# Patient Record
Sex: Female | Born: 1945
Health system: Southern US, Community
[De-identification: ages and names within clinical notes are randomized; demographics above are authoritative.]

## PROBLEM LIST (undated history)

## (undated) DIAGNOSIS — M1990 Unspecified osteoarthritis, unspecified site: Secondary | ICD-10-CM

## (undated) DIAGNOSIS — M8580 Other specified disorders of bone density and structure, unspecified site: Secondary | ICD-10-CM

## (undated) DIAGNOSIS — J449 Chronic obstructive pulmonary disease, unspecified: Secondary | ICD-10-CM

## (undated) DIAGNOSIS — F419 Anxiety disorder, unspecified: Secondary | ICD-10-CM

## (undated) DIAGNOSIS — I10 Essential (primary) hypertension: Secondary | ICD-10-CM

## (undated) DIAGNOSIS — K219 Gastro-esophageal reflux disease without esophagitis: Secondary | ICD-10-CM

## (undated) DIAGNOSIS — H409 Unspecified glaucoma: Secondary | ICD-10-CM

## (undated) DIAGNOSIS — E785 Hyperlipidemia, unspecified: Secondary | ICD-10-CM

## (undated) HISTORY — DX: Other specified disorders of bone density and structure, unspecified site: M85.80

## (undated) HISTORY — PX: CARPAL TUNNEL RELEASE: SHX101

## (undated) HISTORY — DX: Unspecified glaucoma: H40.9

## (undated) HISTORY — PX: SHOULDER SURGERY: SHX246

## (undated) HISTORY — PX: ABDOMINAL HYSTERECTOMY: SHX81

## (undated) HISTORY — DX: Hyperlipidemia, unspecified: E78.5

## (undated) HISTORY — DX: Anxiety disorder, unspecified: F41.9

## (undated) HISTORY — PX: ELBOW SURGERY: SHX618

## (undated) HISTORY — DX: Essential (primary) hypertension: I10

## (undated) HISTORY — DX: Gastro-esophageal reflux disease without esophagitis: K21.9

---

## 1996-03-11 DIAGNOSIS — H409 Unspecified glaucoma: Secondary | ICD-10-CM

## 1996-03-11 HISTORY — DX: Unspecified glaucoma: H40.9

## 2003-01-07 ENCOUNTER — Other Ambulatory Visit: Admission: RE | Admit: 2003-01-07 | Discharge: 2003-01-07 | Payer: Self-pay | Admitting: Family Medicine

## 2004-08-10 ENCOUNTER — Ambulatory Visit (HOSPITAL_COMMUNITY): Admission: RE | Admit: 2004-08-10 | Discharge: 2004-08-10 | Payer: Self-pay | Admitting: Gastroenterology

## 2005-09-24 ENCOUNTER — Other Ambulatory Visit: Admission: RE | Admit: 2005-09-24 | Discharge: 2005-09-24 | Payer: Self-pay | Admitting: Family Medicine

## 2010-08-18 ENCOUNTER — Inpatient Hospital Stay (INDEPENDENT_AMBULATORY_CARE_PROVIDER_SITE_OTHER)
Admission: RE | Admit: 2010-08-18 | Discharge: 2010-08-18 | Disposition: A | Discharge status: Home / Self Care | Payer: Medicare Other | Source: Ambulatory Visit | Attending: Emergency Medicine | Admitting: Emergency Medicine

## 2010-08-18 DIAGNOSIS — S058X9A Other injuries of unspecified eye and orbit, initial encounter: Secondary | ICD-10-CM

## 2012-06-11 ENCOUNTER — Other Ambulatory Visit: Payer: Self-pay | Admitting: Nurse Practitioner

## 2012-06-19 ENCOUNTER — Other Ambulatory Visit: Payer: Self-pay | Admitting: Nurse Practitioner

## 2012-06-19 NOTE — Telephone Encounter (Signed)
Patient last seen in office on 11-20-11 for cpe. Please advise. Thank you

## 2012-07-27 ENCOUNTER — Encounter: Payer: Self-pay | Admitting: Nurse Practitioner

## 2012-07-27 ENCOUNTER — Ambulatory Visit (INDEPENDENT_AMBULATORY_CARE_PROVIDER_SITE_OTHER): Payer: Medicare Other | Admitting: Nurse Practitioner

## 2012-07-27 VITALS — BP 131/85 | HR 84 | Temp 99.1°F | Ht 66.0 in | Wt 144.0 lb

## 2012-07-27 DIAGNOSIS — E785 Hyperlipidemia, unspecified: Secondary | ICD-10-CM | POA: Insufficient documentation

## 2012-07-27 DIAGNOSIS — H409 Unspecified glaucoma: Secondary | ICD-10-CM

## 2012-07-27 DIAGNOSIS — K219 Gastro-esophageal reflux disease without esophagitis: Secondary | ICD-10-CM | POA: Insufficient documentation

## 2012-07-27 DIAGNOSIS — J449 Chronic obstructive pulmonary disease, unspecified: Secondary | ICD-10-CM | POA: Insufficient documentation

## 2012-07-27 DIAGNOSIS — M79609 Pain in unspecified limb: Secondary | ICD-10-CM

## 2012-07-27 DIAGNOSIS — I10 Essential (primary) hypertension: Secondary | ICD-10-CM

## 2012-07-27 DIAGNOSIS — J45909 Unspecified asthma, uncomplicated: Secondary | ICD-10-CM

## 2012-07-27 LAB — COMPLETE METABOLIC PANEL WITH GFR
Albumin: 4.7 g/dL (ref 3.5–5.2)
Alkaline Phosphatase: 45 U/L (ref 39–117)
BUN: 17 mg/dL (ref 6–23)
GFR, Est African American: 57 mL/min — ABNORMAL LOW
GFR, Est Non African American: 50 mL/min — ABNORMAL LOW
Glucose, Bld: 94 mg/dL (ref 70–99)
Potassium: 3.9 mEq/L (ref 3.5–5.3)

## 2012-07-27 MED ORDER — HYDROCODONE-ACETAMINOPHEN 5-325 MG PO TABS: 1.0000 | ORAL_TABLET | Freq: Four times a day (QID) | ORAL | Status: DC | PRN

## 2012-07-27 MED ORDER — FLUTICASONE-SALMETEROL 250-50 MCG/DOSE IN AEPB: 1.0000 | INHALATION_SPRAY | Freq: Two times a day (BID) | RESPIRATORY_TRACT | Status: DC

## 2012-07-27 MED ORDER — FENOFIBRATE MICRONIZED 134 MG PO CAPS: 134.0000 mg | ORAL_CAPSULE | Freq: Every day | ORAL | Status: DC

## 2012-07-27 MED ORDER — LISINOPRIL-HYDROCHLOROTHIAZIDE 20-25 MG PO TABS: 1.0000 | ORAL_TABLET | Freq: Every day | ORAL | Status: DC

## 2012-07-27 MED ORDER — PRAVASTATIN SODIUM 40 MG PO TABS: 40.0000 mg | ORAL_TABLET | Freq: Every day | ORAL | Status: DC

## 2012-07-27 MED ORDER — OMEPRAZOLE 20 MG PO CPDR: 20.0000 mg | DELAYED_RELEASE_CAPSULE | Freq: Two times a day (BID) | ORAL | Status: DC

## 2012-07-27 NOTE — Progress Notes (Signed)
Subjective:    Patient ID: Samantha Huerta, female    DOB: Nov 10, 1945, 67 y.o.   MRN: [id number]  Hypertension This is a chronic problem. The current episode started more than 1 year ago. The problem is unchanged. The problem is controlled. Pertinent negatives include no blurred vision, chest pain, headaches, peripheral edema, shortness of breath or sweats. Risk factors for coronary artery disease include dyslipidemia and post-menopausal state. Past treatments include ACE inhibitors and diuretics. The current treatment provides significant improvement. Compliance problems include diet and exercise.   Hyperlipidemia This is a chronic problem. The current episode started more than 1 year ago. The problem is controlled. Recent lipid tests were reviewed and are normal. Pertinent negatives include no chest pain or shortness of breath. Current antihyperlipidemic treatment includes fibric acid derivatives and statins. The current treatment provides moderate improvement of lipids. Compliance problems include adherence to diet and adherence to exercise.  Risk factors for coronary artery disease include hypertension and post-menopausal.  Gerd Omeprazole OTC- Worknig well no symptoms when she takes COPD Uses inhalers dialy- under control- Patient still smoking  BIL Carpal Tunnel Syrgery Needs refill on lortab that she takes PRN for pain bil hands   Review of Systems  Eyes: Negative for blurred vision.  Respiratory: Negative for shortness of breath.   Cardiovascular: Negative for chest pain.  Neurological: Negative for headaches.  All other systems reviewed and are negative.       Objective:   Physical Exam  Constitutional: She is oriented to person, place, and time. She appears well-developed and well-nourished.  HENT:  Nose: Nose normal.  Mouth/Throat: Oropharynx is clear and moist.  Eyes: EOM are normal.  Neck: Trachea normal, normal range of motion and full passive range of motion without  pain. Neck supple. No JVD present. Carotid bruit is not present. No thyromegaly present.  Cardiovascular: Normal rate, regular rhythm, normal heart sounds and intact distal pulses.  Exam reveals no gallop and no friction rub.   No murmur heard. Pulmonary/Chest: Effort normal and breath sounds normal.  Abdominal: Soft. Bowel sounds are normal. She exhibits no distension and no mass. There is no tenderness.  Musculoskeletal: Normal range of motion.  Lymphadenopathy:    She has no cervical adenopathy.  Neurological: She is alert and oriented to person, place, and time. She has normal reflexes.  Skin: Skin is warm and dry.  Psychiatric: She has a normal mood and affect. Her behavior is normal. Judgment and thought content normal.   BP 131/85  Pulse 84  Temp(Src) 99.1 F (37.3 C) (Oral)  Ht 5\' 6"  (1.676 m)  Wt 144 lb (65.318 kg)  BMI 23.25 kg/m2        Assessment & Plan:  1. Hypertension Low NA+ diet - COMPLETE METABOLIC PANEL WITH GFR - lisinopril-hydrochlorothiazide (PRINZIDE,ZESTORETIC) 20-25 MG per tablet; Take 1 tablet by mouth daily.  Dispense: 90 tablet; Refill: 1  2. Hyperlipidemia Low fat diet and exercise - NMR Lipoprofile with Lipids - fenofibrate micronized (LOFIBRA) 134 MG capsule; Take 1 capsule (134 mg total) by mouth daily before breakfast.  Dispense: 30 capsule; Refill: 5 - pravastatin (PRAVACHOL) 40 MG tablet; Take 1 tablet (40 mg total) by mouth daily.  Dispense: 30 tablet; Refill: 5  3. GERD (gastroesophageal reflux disease) Watch spicy na dfatty foods- May aggrevate symptoms - omeprazole (PRILOSEC) 20 MG capsule; Take 1 capsule (20 mg total) by mouth 2 (two) times daily.  Dispense: 30 capsule; Refill: 5  4. Asthma, chronic Smoking cessation -  Fluticasone-Salmeterol (ADVAIR DISKUS) 250-50 MCG/DOSE AEPB; Inhale 1 puff into the lungs 2 (two) times daily.  Dispense: 60 each; Refill: 5  5. Glaucoma Keep follw-up appointment with eye doctor  6. Hand pain,  unspecified laterality Moist heat- soak in warm epsom salt - HYDROcodone-acetaminophen (NORCO/VICODIN) 5-325 MG per tablet; Take 1 tablet by mouth every 6 (six) hours as needed for pain.  Dispense: 30 tablet; Refill: 0   Mary-Margaret Daphine Deutscher, FNP

## 2012-07-27 NOTE — Patient Instructions (Signed)
Smoking Cessation Quitting smoking is important to your health and has many advantages. However, it is not always easy to quit since nicotine is a very addictive drug. Often times, people try 3 times or more before being able to quit. This document explains the best ways for you to prepare to quit smoking. Quitting takes hard work and a lot of effort, but you can do it. ADVANTAGES OF QUITTING SMOKING  You will live longer, feel better, and live better.  Your body will feel the impact of quitting smoking almost immediately.  Within 20 minutes, blood pressure decreases. Your pulse returns to its normal level.  After 8 hours, carbon monoxide levels in the blood return to normal. Your oxygen level increases.  After 24 hours, the chance of having a heart attack starts to decrease. Your breath, hair, and body stop smelling like smoke.  After 48 hours, damaged nerve endings begin to recover. Your sense of taste and smell improve.  After 72 hours, the body is virtually free of nicotine. Your bronchial tubes relax and breathing becomes easier.  After 2 to 12 weeks, lungs can hold more air. Exercise becomes easier and circulation improves.  The risk of having a heart attack, stroke, cancer, or lung disease is greatly reduced.  After 1 year, the risk of coronary heart disease is cut in half.  After 5 years, the risk of stroke falls to the same as a nonsmoker.  After 10 years, the risk of lung cancer is cut in half and the risk of other cancers decreases significantly.  After 15 years, the risk of coronary heart disease drops, usually to the level of a nonsmoker.  If you are pregnant, quitting smoking will improve your chances of having a healthy baby.  The people you live with, especially any children, will be healthier.  You will have extra money to spend on things other than cigarettes. QUESTIONS TO THINK ABOUT BEFORE ATTEMPTING TO QUIT You may want to talk about your answers with your  caregiver.  Why do you want to quit?  If you tried to quit in the past, what helped and what did not?  What will be the most difficult situations for you after you quit? How will you plan to handle them?  Who can help you through the tough times? Your family? Friends? A caregiver?  What pleasures do you get from smoking? What ways can you still get pleasure if you quit? Here are some questions to ask your caregiver:  How can you help me to be successful at quitting?  What medicine do you think would be best for me and how should I take it?  What should I do if I need more help?  What is smoking withdrawal like? How can I get information on withdrawal? GET READY  Set a quit date.  Change your environment by getting rid of all cigarettes, ashtrays, matches, and lighters in your home, car, or work. Do not let people smoke in your home.  Review your past attempts to quit. Think about what worked and what did not. GET SUPPORT AND ENCOURAGEMENT You have a better chance of being successful if you have help. You can get support in many ways.  Tell your family, friends, and co-workers that you are going to quit and need their support. Ask them not to smoke around you.  Get individual, group, or telephone counseling and support. Programs are available at local hospitals and health centers. Call your local health department for   information about programs in your area.  Spiritual beliefs and practices may help some smokers quit.  Download a "quit meter" on your computer to keep track of quit statistics, such as how long you have gone without smoking, cigarettes not smoked, and money saved.  Get a self-help book about quitting smoking and staying off of tobacco. LEARN NEW SKILLS AND BEHAVIORS  Distract yourself from urges to smoke. Talk to someone, go for a walk, or occupy your time with a task.  Change your normal routine. Take a different route to work. Drink tea instead of coffee.  Eat breakfast in a different place.  Reduce your stress. Take a hot bath, exercise, or read a book.  Plan something enjoyable to do every day. Reward yourself for not smoking.  Explore interactive web-based programs that specialize in helping you quit. GET MEDICINE AND USE IT CORRECTLY Medicines can help you stop smoking and decrease the urge to smoke. Combining medicine with the above behavioral methods and support can greatly increase your chances of successfully quitting smoking.  Nicotine replacement therapy helps deliver nicotine to your body without the negative effects and risks of smoking. Nicotine replacement therapy includes nicotine gum, lozenges, inhalers, nasal sprays, and skin patches. Some may be available over-the-counter and others require a prescription.  Antidepressant medicine helps people abstain from smoking, but how this works is unknown. This medicine is available by prescription.  Nicotinic receptor partial agonist medicine simulates the effect of nicotine in your brain. This medicine is available by prescription. Ask your caregiver for advice about which medicines to use and how to use them based on your health history. Your caregiver will tell you what side effects to look out for if you choose to be on a medicine or therapy. Carefully read the information on the package. Do not use any other product containing nicotine while using a nicotine replacement product.  RELAPSE OR DIFFICULT SITUATIONS Most relapses occur within the first 3 months after quitting. Do not be discouraged if you start smoking again. Remember, most people try several times before finally quitting. You may have symptoms of withdrawal because your body is used to nicotine. You may crave cigarettes, be irritable, feel very hungry, cough often, get headaches, or have difficulty concentrating. The withdrawal symptoms are only temporary. They are strongest when you first quit, but they will go away within  10 14 days. To reduce the chances of relapse, try to:  Avoid drinking alcohol. Drinking lowers your chances of successfully quitting.  Reduce the amount of caffeine you consume. Once you quit smoking, the amount of caffeine in your body increases and can give you symptoms, such as a rapid heartbeat, sweating, and anxiety.  Avoid smokers because they can make you want to smoke.  Do not let weight gain distract you. Many smokers will gain weight when they quit, usually less than 10 pounds. Eat a healthy diet and stay active. You can always lose the weight gained after you quit.  Find ways to improve your mood other than smoking. FOR MORE INFORMATION  www.smokefree.gov  Document Released: 02/19/2001 Document Revised: 08/27/2011 Document Reviewed: 06/06/2011 [employer] Patient Information 2013 [employer].  

## 2012-07-31 LAB — NMR LIPOPROFILE WITH LIPIDS
HDL Size: 9.7 nm (ref >=9.2)
LDL Particle Number: 751 nmol/L (ref <1000)
LDL Size: 20.5 nm — ABNORMAL LOW (ref >20.5)
Large VLDL-P: 1.9 nmol/L (ref <=2.7)
Small LDL Particle Number: 433 nmol/L (ref <=527)
Triglycerides: 58 mg/dL (ref <150)
VLDL Size: 50 nm — ABNORMAL HIGH (ref <=46.6)

## 2012-08-12 ENCOUNTER — Telehealth: Payer: Self-pay | Admitting: Nurse Practitioner

## 2012-08-12 NOTE — Telephone Encounter (Signed)
Patient aware sent to triage to make an appointment

## 2012-08-12 NOTE — Telephone Encounter (Signed)
NTBS.

## 2012-08-12 NOTE — Telephone Encounter (Signed)
Please make an appointment asap

## 2012-08-12 NOTE — Telephone Encounter (Signed)
Already addressed- NTBS

## 2012-08-13 ENCOUNTER — Ambulatory Visit (INDEPENDENT_AMBULATORY_CARE_PROVIDER_SITE_OTHER): Payer: Medicare Other | Admitting: Nurse Practitioner

## 2012-08-13 ENCOUNTER — Encounter: Payer: Self-pay | Admitting: Nurse Practitioner

## 2012-08-13 VITALS — BP 143/79 | HR 88 | Temp 98.0°F | Ht 65.5 in | Wt 155.0 lb

## 2012-08-13 DIAGNOSIS — R350 Frequency of micturition: Secondary | ICD-10-CM

## 2012-08-13 LAB — POCT UA - MICROSCOPIC ONLY
Casts, Ur, LPF, POC: NEGATIVE
Crystals, Ur, HPF, POC: NEGATIVE

## 2012-08-13 LAB — POCT URINALYSIS DIPSTICK
Bilirubin, UA: NEGATIVE
Ketones, UA: NEGATIVE
Spec Grav, UA: 1.02
pH, UA: 7

## 2012-08-13 MED ORDER — CIPROFLOXACIN HCL 500 MG PO TABS: 500.0000 mg | ORAL_TABLET | Freq: Two times a day (BID) | ORAL | Status: DC

## 2012-08-13 NOTE — Progress Notes (Signed)
  Subjective:    Patient ID: Samantha Huerta, female    DOB: 05-24-1945, 67 y.o.   MRN: [id number]  Urinary Tract Infection  This is a new problem. The current episode started today. The problem occurs every urination. The problem has been gradually worsening. The quality of the pain is described as burning and aching. The pain is at a severity of 10/10. The pain is severe. There has been no fever. The fever has been present for 3 - 4 days. Associated symptoms include frequency and urgency. She has tried NSAIDs for the symptoms.    Pt c/o of burning, itching, and pain with urination.  Pain is 10/10. She states pain is worse with walking. Pt taken motrin 400mg  with no relief and taken Norco 5-325 with relief.     Review of Systems  Genitourinary: Positive for dysuria, urgency, frequency and difficulty urinating.  Musculoskeletal: Positive for gait problem.  All other systems reviewed and are negative.       Objective:   Physical Exam  Constitutional: She appears well-developed and well-nourished.  Neck: Normal range of motion. No thyromegaly present.  Cardiovascular: Normal rate, regular rhythm and normal heart sounds.   Pulmonary/Chest: Effort normal.  Course breath sound bilaterally, with dry productive cough  Abdominal: Soft. Bowel sounds are normal. She exhibits no mass. There is no tenderness. There is no guarding.  Negative for flank pain with palapation  Musculoskeletal: Normal range of motion.  Skin: Skin is warm and dry.  Psychiatric: She has a normal mood and affect. Her behavior is normal. Judgment and thought content normal.   BP 143/79  Pulse 88  Temp(Src) 98 F (36.7 C)  Ht 5' 5.5" (1.664 m)  Wt 155 lb (70.308 kg)  BMI 25.39 kg/m2  Results for orders placed in visit on 08/13/12  POCT URINALYSIS DIPSTICK      Result Value Range   Color, UA yellow     Clarity, UA cloudy     Glucose, UA neg     Bilirubin, UA neg     Ketones, UA neg     Spec Grav, UA 1.020     Blood, UA mod     pH, UA 7.0     Protein, UA mod     Urobilinogen, UA negative     Nitrite, UA neg     Leukocytes, UA moderate (2+)    POCT UA - MICROSCOPIC ONLY      Result Value Range   WBC, Ur, HPF, POC 15-20     RBC, urine, microscopic tnyc     Bacteria, U Microscopic few     Mucus, UA trace     Epithelial cells, urine per micros occ     Crystals, Ur, HPF, POC neg     Casts, Ur, LPF, POC neg     Yeast, UA neg           Assessment & Plan:  1. Frequency of urination AZO over the counter if helps with symptoms - POCT urinalysis dipstick - POCT UA - Microscopic Only - Urine culture - ciprofloxacin (CIPRO) 500 MG tablet; Take 1 tablet (500 mg total) by mouth 2 (two) times daily.  Dispense: 14 tablet; Refill: 0  Mary-Margaret Daphine Deutscher, FNP

## 2012-08-13 NOTE — Patient Instructions (Addendum)
Urinary Tract Infection  Urinary tract infections (UTIs) can develop anywhere along your urinary tract. Your urinary tract is your body's drainage system for removing wastes and extra water. Your urinary tract includes two kidneys, two ureters, a bladder, and a urethra. Your kidneys are a pair of bean-shaped organs. Each kidney is about the size of your fist. They are located below your ribs, one on each side of your spine.  CAUSES  Infections are caused by microbes, which are microscopic organisms, including fungi, viruses, and bacteria. These organisms are so small that they can only be seen through a microscope. Bacteria are the microbes that most commonly cause UTIs.  SYMPTOMS   Symptoms of UTIs may vary by age and gender of the patient and by the location of the infection. Symptoms in young women typically include a frequent and intense urge to urinate and a painful, burning feeling in the bladder or urethra during urination. Older women and men are more likely to be tired, shaky, and weak and have muscle aches and abdominal pain. A fever may mean the infection is in your kidneys. Other symptoms of a kidney infection include pain in your back or sides below the ribs, nausea, and vomiting.  DIAGNOSIS  To diagnose a UTI, your caregiver will ask you about your symptoms. Your caregiver also will ask to provide a urine sample. The urine sample will be tested for bacteria and white blood cells. White blood cells are made by your body to help fight infection.  TREATMENT   Typically, UTIs can be treated with medication. Because most UTIs are caused by a bacterial infection, they usually can be treated with the use of antibiotics. The choice of antibiotic and length of treatment depend on your symptoms and the type of bacteria causing your infection.  HOME CARE INSTRUCTIONS   If you were prescribed antibiotics, take them exactly as your caregiver instructs you. Finish the medication even if you feel better after you  have only taken some of the medication.   Drink enough water and fluids to keep your urine clear or pale yellow.   Avoid caffeine, tea, and carbonated beverages. They tend to irritate your bladder.   Empty your bladder often. Avoid holding urine for long periods of time.   Empty your bladder before and after sexual intercourse.   After a bowel movement, women should cleanse from front to back. Use each tissue only once.  SEEK MEDICAL CARE IF:    You have back pain.   You develop a fever.   Your symptoms do not begin to resolve within 3 days.  SEEK IMMEDIATE MEDICAL CARE IF:    You have severe back pain or lower abdominal pain.   You develop chills.   You have nausea or vomiting.   You have continued burning or discomfort with urination.  MAKE SURE YOU:    Understand these instructions.   Will watch your condition.   Will get help right away if you are not doing well or get worse.  Document Released: 12/05/2004 Document Revised: 08/27/2011 Document Reviewed: 04/05/2011  [employer] Patient Information 2014 [employer].

## 2012-08-15 LAB — URINE CULTURE

## 2012-08-18 NOTE — Progress Notes (Signed)
Pt aware of culture 

## 2012-10-14 ENCOUNTER — Other Ambulatory Visit: Payer: Self-pay | Admitting: Nurse Practitioner

## 2012-10-15 NOTE — Telephone Encounter (Signed)
Patient last seen in office on 08-13-12. Rx last filled on 07-27-12 for #30. Please advise. If approved please print and route to Pool B so the nurse can call pt to pick up

## 2012-10-15 NOTE — Telephone Encounter (Signed)
rx ready for pickup 

## 2012-10-15 NOTE — Telephone Encounter (Signed)
Up front 

## 2012-12-17 ENCOUNTER — Ambulatory Visit: Payer: Medicare Other

## 2013-01-05 ENCOUNTER — Ambulatory Visit (INDEPENDENT_AMBULATORY_CARE_PROVIDER_SITE_OTHER): Payer: Medicare Other

## 2013-01-05 DIAGNOSIS — Z23 Encounter for immunization: Secondary | ICD-10-CM

## 2013-01-07 ENCOUNTER — Telehealth: Payer: Self-pay | Admitting: Nurse Practitioner

## 2013-01-07 NOTE — Telephone Encounter (Signed)
Hasn't had filled since August- what does she need it for.

## 2013-01-07 NOTE — Telephone Encounter (Signed)
Picked up the phone and hung up unable to leave message

## 2013-01-14 NOTE — Telephone Encounter (Signed)
Again what does she need it for- hasn't had since august

## 2013-01-15 NOTE — Telephone Encounter (Signed)
When she had surgery on her wrist and it is bothering her so she just take it when she needs it

## 2013-02-08 ENCOUNTER — Other Ambulatory Visit: Payer: Self-pay

## 2013-02-08 DIAGNOSIS — I10 Essential (primary) hypertension: Secondary | ICD-10-CM

## 2013-02-08 MED ORDER — LISINOPRIL-HYDROCHLOROTHIAZIDE 20-25 MG PO TABS: 1.0000 | ORAL_TABLET | Freq: Every day | ORAL | Status: DC

## 2013-02-08 NOTE — Telephone Encounter (Signed)
Last seen 6/14  MMM  Patient requesting a 90 day supply

## 2013-04-08 ENCOUNTER — Other Ambulatory Visit: Payer: Self-pay | Admitting: Nurse Practitioner

## 2013-04-09 ENCOUNTER — Other Ambulatory Visit: Payer: Self-pay | Admitting: *Deleted

## 2013-04-09 DIAGNOSIS — E785 Hyperlipidemia, unspecified: Secondary | ICD-10-CM

## 2013-04-09 MED ORDER — PRAVASTATIN SODIUM 40 MG PO TABS: 40.0000 mg | ORAL_TABLET | Freq: Every day | ORAL | Status: DC

## 2013-04-09 MED ORDER — FENOFIBRATE MICRONIZED 134 MG PO CAPS: 134.0000 mg | ORAL_CAPSULE | Freq: Every day | ORAL | Status: DC

## 2013-04-20 ENCOUNTER — Telehealth: Payer: Self-pay | Admitting: Nurse Practitioner

## 2013-04-23 NOTE — Telephone Encounter (Signed)
Has had letters that she is over due for her mammo, and in here chart she is not due till 04/01/14 she said this is wrong wants to know what is going on

## 2013-04-23 NOTE — Telephone Encounter (Signed)
Last one in chart says 01/07/11

## 2013-04-25 NOTE — Telephone Encounter (Signed)
Chart health maintenance automatically defaults to every 2 years- but we still recommend yearly mammogram- needs to go ahead a get

## 2013-04-26 NOTE — Telephone Encounter (Signed)
Left detailed message on home voicemail. 

## 2013-05-11 ENCOUNTER — Ambulatory Visit (INDEPENDENT_AMBULATORY_CARE_PROVIDER_SITE_OTHER): Payer: Medicare Other | Admitting: Nurse Practitioner

## 2013-05-11 ENCOUNTER — Encounter: Payer: Self-pay | Admitting: Nurse Practitioner

## 2013-05-11 VITALS — BP 125/69 | HR 106 | Temp 98.1°F | Ht 65.0 in | Wt 135.0 lb

## 2013-05-11 DIAGNOSIS — K219 Gastro-esophageal reflux disease without esophagitis: Secondary | ICD-10-CM

## 2013-05-11 DIAGNOSIS — Z13820 Encounter for screening for osteoporosis: Secondary | ICD-10-CM

## 2013-05-11 DIAGNOSIS — E785 Hyperlipidemia, unspecified: Secondary | ICD-10-CM

## 2013-05-11 DIAGNOSIS — J45909 Unspecified asthma, uncomplicated: Secondary | ICD-10-CM

## 2013-05-11 LAB — POCT UA - MICROSCOPIC ONLY
BACTERIA, U MICROSCOPIC: NEGATIVE
CASTS, UR, LPF, POC: NEGATIVE
Crystals, Ur, HPF, POC: NEGATIVE
MUCUS UA: NEGATIVE
RBC, urine, microscopic: NEGATIVE
Yeast, UA: NEGATIVE

## 2013-05-11 LAB — POCT URINALYSIS DIPSTICK
Bilirubin, UA: NEGATIVE
Blood, UA: NEGATIVE
Glucose, UA: NEGATIVE
KETONES UA: NEGATIVE
Nitrite, UA: NEGATIVE
PH UA: 6
PROTEIN UA: NEGATIVE
SPEC GRAV UA: 1.015
Urobilinogen, UA: NEGATIVE

## 2013-05-11 MED ORDER — FLUTICASONE-SALMETEROL 250-50 MCG/DOSE IN AEPB: 1.0000 | INHALATION_SPRAY | Freq: Two times a day (BID) | RESPIRATORY_TRACT | Status: DC

## 2013-05-11 MED ORDER — FENOFIBRATE MICRONIZED 134 MG PO CAPS: 134.0000 mg | ORAL_CAPSULE | Freq: Every day | ORAL | Status: DC

## 2013-05-11 MED ORDER — OMEPRAZOLE 20 MG PO CPDR: 20.0000 mg | DELAYED_RELEASE_CAPSULE | Freq: Two times a day (BID) | ORAL | Status: DC

## 2013-05-11 MED ORDER — PRAVASTATIN SODIUM 40 MG PO TABS: 40.0000 mg | ORAL_TABLET | Freq: Every day | ORAL | Status: DC

## 2013-05-11 NOTE — Progress Notes (Signed)
Subjective:    Patient ID: Samantha Huerta, female    DOB: 1945/09/05, 68 y.o.   MRN: [id number]  Patient here today for follow up- no complaints or changes since last visit.  Hypertension This is a chronic problem. The current episode started more than 1 year ago. The problem is unchanged. The problem is controlled. Pertinent negatives include no blurred vision, chest pain, headaches, peripheral edema, shortness of breath or sweats. Risk factors for coronary artery disease include dyslipidemia and post-menopausal state. Past treatments include ACE inhibitors and diuretics. The current treatment provides significant improvement. Compliance problems include diet and exercise.   Hyperlipidemia This is a chronic problem. The current episode started more than 1 year ago. The problem is controlled. Recent lipid tests were reviewed and are normal. Pertinent negatives include no chest pain or shortness of breath. Current antihyperlipidemic treatment includes fibric acid derivatives and statins. The current treatment provides moderate improvement of lipids. Compliance problems include adherence to diet and adherence to exercise.  Risk factors for coronary artery disease include hypertension and post-menopausal.  Gerd Omeprazole OTC- Worknig well no symptoms when she takes COPD Uses inhalers dialy- under control- Patient still smoking  Review of Systems  Constitutional: Negative for fever, activity change, appetite change and fatigue.  HENT: Negative for congestion and postnasal drip.   Eyes: Negative for blurred vision.  Respiratory: Negative for shortness of breath.   Cardiovascular: Negative for chest pain.  Gastrointestinal: Negative for diarrhea, constipation and blood in stool.  Genitourinary: Negative for dysuria and frequency.  Skin: Negative for rash.  Neurological: Negative for weakness and headaches.  All other systems reviewed and are negative.       Objective:   Physical Exam   Constitutional: She is oriented to person, place, and time. She appears well-developed and well-nourished.  HENT:  Nose: Nose normal.  Mouth/Throat: Oropharynx is clear and moist.  Eyes: EOM are normal.  Neck: Trachea normal, normal range of motion and full passive range of motion without pain. Neck supple. No JVD present. Carotid bruit is not present. No thyromegaly present.  Cardiovascular: Normal rate, regular rhythm, normal heart sounds and intact distal pulses.  Exam reveals no gallop and no friction rub.   No murmur heard. Pulmonary/Chest: Effort normal and breath sounds normal. No respiratory distress. She has no wheezes. She has no rales.  Abdominal: Soft. Bowel sounds are normal. She exhibits no distension and no mass. There is no tenderness.  Musculoskeletal: Normal range of motion.  Lymphadenopathy:    She has no cervical adenopathy.  Neurological: She is alert and oriented to person, place, and time. She has normal reflexes.  Skin: Skin is warm and dry.  Psychiatric: She has a normal mood and affect. Her behavior is normal. Judgment and thought content normal.   BP 125/69  Pulse 106  Temp(Src) 98.1 F (36.7 C) (Oral)  Ht $R'5\' 5"'WZ$  (1.651 m)  Wt 135 lb (61.236 kg)  BMI 22.47 kg/m2        Assessment & Plan:   1. Hyperlipidemia   2. Asthma, chronic   3. GERD (gastroesophageal reflux disease)    Orders Placed This Encounter  Procedures  . CMP14+EGFR  . NMR, lipoprofile  . POCT urinalysis dipstick  . POCT UA - Microscopic Only   Meds ordered this encounter  Medications  . fenofibrate micronized (LOFIBRA) 134 MG capsule    Sig: Take 1 capsule (134 mg total) by mouth daily before breakfast.    Dispense:  30 capsule  Refill:  0    Needs to be seen before next refill    Order Specific Question:  Supervising Provider    Answer:  Chipper Herb [[id number]]  . Fluticasone-Salmeterol (ADVAIR DISKUS) 250-50 MCG/DOSE AEPB    Sig: Inhale 1 puff into the lungs 2 (two) times  daily.    Dispense:  60 each    Refill:  5    NTBS    Order Specific Question:  Supervising Provider    Answer:  Chipper Herb [[id number]]  . omeprazole (PRILOSEC) 20 MG capsule    Sig: Take 1 capsule (20 mg total) by mouth 2 (two) times daily.    Dispense:  30 capsule    Refill:  5    Order Specific Question:  Supervising Provider    Answer:  Chipper Herb [[id number]]  . pravastatin (PRAVACHOL) 40 MG tablet    Sig: Take 1 tablet (40 mg total) by mouth daily.    Dispense:  30 tablet    Refill:  0    Needs to be seen before next refill    Order Specific Question:  Supervising Provider    Answer:  Chipper Herb [[id number]]  ordered dexa scan  Hemoccult cards given Mammogram scheduled for the end of the month Labs pending Health maintenance reviewed Diet and exercise encouraged Continue all meds Follow up  In 6 months   [place], FNP

## 2013-05-11 NOTE — Patient Instructions (Signed)

## 2013-05-12 ENCOUNTER — Telehealth: Payer: Self-pay | Admitting: Nurse Practitioner

## 2013-05-12 DIAGNOSIS — M549 Dorsalgia, unspecified: Principal | ICD-10-CM

## 2013-05-12 DIAGNOSIS — G8929 Other chronic pain: Secondary | ICD-10-CM | POA: Insufficient documentation

## 2013-05-12 MED ORDER — HYDROCODONE-ACETAMINOPHEN 5-325 MG PO TABS: ORAL_TABLET | ORAL | Status: DC

## 2013-05-12 NOTE — Telephone Encounter (Signed)
rx ready for pickup 

## 2013-05-13 ENCOUNTER — Other Ambulatory Visit: Payer: Self-pay | Admitting: Nurse Practitioner

## 2013-05-13 LAB — CMP14+EGFR
A/G RATIO: 2 (ref 1.1–2.5)
ALK PHOS: 54 IU/L (ref 39–117)
ALT: 20 IU/L (ref 0–32)
AST: 28 IU/L (ref 0–40)
Albumin: 4.6 g/dL (ref 3.6–4.8)
BUN/Creatinine Ratio: 17 (ref 11–26)
BUN: 23 mg/dL (ref 8–27)
CO2: 23 mmol/L (ref 18–29)
Calcium: 10.3 mg/dL (ref 8.7–10.3)
Chloride: 99 mmol/L (ref 97–108)
Creatinine, Ser: 1.36 mg/dL — ABNORMAL HIGH (ref 0.57–1.00)
GFR calc Af Amer: 46 mL/min/{1.73_m2} — ABNORMAL LOW (ref >59)
GFR, EST NON AFRICAN AMERICAN: 40 mL/min/{1.73_m2} — AB (ref >59)
GLOBULIN, TOTAL: 2.3 g/dL (ref 1.5–4.5)
Glucose: 100 mg/dL — ABNORMAL HIGH (ref 65–99)
Potassium: 4.4 mmol/L (ref 3.5–5.2)
SODIUM: 140 mmol/L (ref 134–144)
Total Bilirubin: 0.3 mg/dL (ref 0.0–1.2)
Total Protein: 6.9 g/dL (ref 6.0–8.5)

## 2013-05-13 LAB — NMR, LIPOPROFILE
Cholesterol: 153 mg/dL (ref <200)
HDL CHOLESTEROL BY NMR: 71 mg/dL (ref >=40)
HDL PARTICLE NUMBER: 45.9 umol/L (ref >=30.5)
LDL Particle Number: 876 nmol/L (ref <1000)
LDL SIZE: 19.8 nm — AB (ref >20.5)
LDLC SERPL CALC-MCNC: 58 mg/dL (ref <100)
LP-IR SCORE: 52 — AB (ref <=45)
SMALL LDL PARTICLE NUMBER: 563 nmol/L — AB (ref <=527)
Triglycerides by NMR: 122 mg/dL (ref <150)

## 2013-05-13 MED ORDER — CIPROFLOXACIN HCL 500 MG PO TABS: 500.0000 mg | ORAL_TABLET | Freq: Two times a day (BID) | ORAL | Status: DC

## 2013-05-13 NOTE — Telephone Encounter (Signed)
Patient aware rx ready to be picked up 

## 2013-05-26 ENCOUNTER — Other Ambulatory Visit: Payer: Medicare Other

## 2013-05-26 DIAGNOSIS — Z1212 Encounter for screening for malignant neoplasm of rectum: Secondary | ICD-10-CM

## 2013-05-26 NOTE — Progress Notes (Signed)
Pt mailed in FOBT [facility] called and needed an order

## 2013-05-31 LAB — FECAL OCCULT BLOOD, IMMUNOCHEMICAL: FECAL OCCULT BLD: NEGATIVE

## 2013-06-07 ENCOUNTER — Encounter: Payer: Self-pay | Admitting: *Deleted

## 2013-06-07 NOTE — Progress Notes (Signed)
Quick Note:  Copy of labs sent to patient ______ 

## 2013-06-23 ENCOUNTER — Other Ambulatory Visit: Payer: Medicare Other

## 2013-06-23 ENCOUNTER — Ambulatory Visit: Payer: Self-pay

## 2013-06-24 ENCOUNTER — Encounter: Payer: Self-pay | Admitting: *Deleted

## 2013-07-27 ENCOUNTER — Other Ambulatory Visit: Payer: Self-pay | Admitting: Nurse Practitioner

## 2013-08-25 ENCOUNTER — Ambulatory Visit: Payer: Medicare Other

## 2013-08-25 ENCOUNTER — Other Ambulatory Visit: Payer: Medicare Other

## 2013-09-01 ENCOUNTER — Ambulatory Visit: Payer: Medicare Other

## 2013-09-01 ENCOUNTER — Other Ambulatory Visit: Payer: Medicare Other

## 2013-09-09 ENCOUNTER — Other Ambulatory Visit: Payer: Self-pay | Admitting: Nurse Practitioner

## 2013-11-08 ENCOUNTER — Telehealth: Payer: Self-pay | Admitting: Nurse Practitioner

## 2013-11-08 ENCOUNTER — Other Ambulatory Visit: Payer: Self-pay | Admitting: Nurse Practitioner

## 2013-11-09 NOTE — Telephone Encounter (Signed)
Letter sent with new appointment date/time 

## 2013-11-17 ENCOUNTER — Ambulatory Visit: Payer: Medicare Other

## 2013-11-17 ENCOUNTER — Other Ambulatory Visit: Payer: Medicare Other

## 2013-11-17 ENCOUNTER — Ambulatory Visit: Payer: Self-pay

## 2013-11-18 ENCOUNTER — Telehealth: Payer: Self-pay | Admitting: Family Medicine

## 2013-11-18 MED ORDER — PRAVASTATIN SODIUM 40 MG PO TABS: 40.0000 mg | ORAL_TABLET | Freq: Every day | ORAL | Status: DC

## 2013-11-18 NOTE — Telephone Encounter (Signed)
done

## 2013-12-09 ENCOUNTER — Other Ambulatory Visit: Payer: Self-pay | Admitting: *Deleted

## 2013-12-09 ENCOUNTER — Telehealth: Payer: Self-pay | Admitting: Family Medicine

## 2013-12-09 ENCOUNTER — Ambulatory Visit (INDEPENDENT_AMBULATORY_CARE_PROVIDER_SITE_OTHER): Payer: Medicare Other

## 2013-12-09 DIAGNOSIS — Z23 Encounter for immunization: Secondary | ICD-10-CM

## 2013-12-09 MED ORDER — FENOFIBRATE MICRONIZED 134 MG PO CAPS: ORAL_CAPSULE | ORAL | Status: DC

## 2013-12-09 NOTE — Telephone Encounter (Signed)
What is the provider request?

## 2013-12-09 NOTE — Telephone Encounter (Signed)
done

## 2013-12-09 NOTE — Telephone Encounter (Signed)
Last lipids 3/15. 

## 2014-01-12 ENCOUNTER — Other Ambulatory Visit: Payer: Medicare Other

## 2014-01-12 ENCOUNTER — Ambulatory Visit: Payer: Medicare Other

## 2014-01-19 ENCOUNTER — Other Ambulatory Visit: Payer: Self-pay | Admitting: Nurse Practitioner

## 2014-01-25 ENCOUNTER — Ambulatory Visit (INDEPENDENT_AMBULATORY_CARE_PROVIDER_SITE_OTHER): Payer: Medicare Other

## 2014-01-25 ENCOUNTER — Ambulatory Visit (INDEPENDENT_AMBULATORY_CARE_PROVIDER_SITE_OTHER): Payer: Medicare Other | Admitting: Nurse Practitioner

## 2014-01-25 ENCOUNTER — Encounter: Payer: Self-pay | Admitting: Nurse Practitioner

## 2014-01-25 VITALS — BP 130/74 | HR 90 | Temp 96.6°F | Ht 65.0 in | Wt 140.0 lb

## 2014-01-25 DIAGNOSIS — Z01419 Encounter for gynecological examination (general) (routine) without abnormal findings: Secondary | ICD-10-CM

## 2014-01-25 DIAGNOSIS — I10 Essential (primary) hypertension: Secondary | ICD-10-CM

## 2014-01-25 DIAGNOSIS — Z720 Tobacco use: Secondary | ICD-10-CM

## 2014-01-25 DIAGNOSIS — F17200 Nicotine dependence, unspecified, uncomplicated: Secondary | ICD-10-CM

## 2014-01-25 DIAGNOSIS — Z13820 Encounter for screening for osteoporosis: Secondary | ICD-10-CM

## 2014-01-25 DIAGNOSIS — H409 Unspecified glaucoma: Secondary | ICD-10-CM

## 2014-01-25 DIAGNOSIS — Z0000 Encounter for general adult medical examination without abnormal findings: Secondary | ICD-10-CM

## 2014-01-25 DIAGNOSIS — K219 Gastro-esophageal reflux disease without esophagitis: Secondary | ICD-10-CM

## 2014-01-25 DIAGNOSIS — M549 Dorsalgia, unspecified: Secondary | ICD-10-CM

## 2014-01-25 DIAGNOSIS — G8929 Other chronic pain: Secondary | ICD-10-CM

## 2014-01-25 DIAGNOSIS — E785 Hyperlipidemia, unspecified: Secondary | ICD-10-CM

## 2014-01-25 LAB — POCT URINALYSIS DIPSTICK
BILIRUBIN UA: NEGATIVE
Blood, UA: NEGATIVE
GLUCOSE UA: NEGATIVE
Ketones, UA: NEGATIVE
NITRITE UA: NEGATIVE
Protein, UA: NEGATIVE
Spec Grav, UA: 1.01
Urobilinogen, UA: NEGATIVE
pH, UA: 7.5

## 2014-01-25 LAB — POCT CBC
GRANULOCYTE PERCENT: 63.5 % (ref 37–80)
HCT, POC: 39.7 % (ref 37.7–47.9)
HEMOGLOBIN: 12.8 g/dL (ref 12.2–16.2)
LYMPH, POC: 2.2 (ref 0.6–3.4)
MCH, POC: 29.6 pg (ref 27–31.2)
MCHC: 32.2 g/dL (ref 31.8–35.4)
MCV: 92.1 fL (ref 80–97)
MPV: 7.5 fL (ref 0–99.8)
PLATELET COUNT, POC: 509 10*3/uL — AB (ref 142–424)
POC Granulocyte: 5.3 (ref 2–6.9)
POC LYMPH %: 26.8 % (ref 10–50)
RBC: 4.3 M/uL (ref 4.04–5.48)
RDW, POC: 13.3 %
WBC: 8.3 10*3/uL (ref 4.6–10.2)

## 2014-01-25 LAB — POCT UA - MICROSCOPIC ONLY
Bacteria, U Microscopic: NEGATIVE
CASTS, UR, LPF, POC: NEGATIVE
CRYSTALS, UR, HPF, POC: NEGATIVE
Mucus, UA: NEGATIVE
RBC, URINE, MICROSCOPIC: NEGATIVE
Yeast, UA: NEGATIVE

## 2014-01-25 MED ORDER — HYDROCODONE-ACETAMINOPHEN 5-325 MG PO TABS: ORAL_TABLET | ORAL | Status: DC

## 2014-01-25 MED ORDER — LISINOPRIL-HYDROCHLOROTHIAZIDE 20-25 MG PO TABS: 1.0000 | ORAL_TABLET | Freq: Every day | ORAL | Status: DC

## 2014-01-25 NOTE — Progress Notes (Signed)
Subjective:    Patient ID: Samantha Huerta, female    DOB: February 05, 1946, 68 y.o.   MRN: [id number]  Patient here today for follow up and annual physical exam with PAP- no complaints or changes since last visit.  Hypertension This is a chronic problem. The current episode started more than 1 year ago. The problem is controlled. Pertinent negatives include no chest pain, headaches or shortness of breath. Risk factors for coronary artery disease include dyslipidemia, post-menopausal state and sedentary lifestyle. Past treatments include ACE inhibitors and diuretics. The current treatment provides moderate improvement. Compliance problems include diet and exercise.   Hyperlipidemia This is a chronic problem. The current episode started more than 1 year ago. The problem is uncontrolled. Recent lipid tests were reviewed and are high. She has no history of diabetes, hypothyroidism or obesity. Pertinent negatives include no chest pain or shortness of breath. Current antihyperlipidemic treatment includes statins. The current treatment provides mild improvement of lipids. Compliance problems include adherence to diet and adherence to exercise.  Risk factors for coronary artery disease include dyslipidemia, hypertension and post-menopausal.  Gerd Omeprazole OTC- Worknig well no symptoms when she takes COPD Uses inhalers dialy- under control- Patient still smoking Chronic back pain Takes norco as needed- not a surgery candidate     Review of Systems  Constitutional: Negative for fever, activity change, appetite change and fatigue.  HENT: Negative for congestion and postnasal drip.   Respiratory: Negative for shortness of breath.   Cardiovascular: Negative for chest pain.  Gastrointestinal: Negative for diarrhea, constipation and blood in stool.  Genitourinary: Negative for dysuria and frequency.  Skin: Negative for rash.  Neurological: Negative for weakness and headaches.  All other systems reviewed  and are negative.      Objective:   Physical Exam  Constitutional: She is oriented to person, place, and time. She appears well-developed and well-nourished.  HENT:  Head: Normocephalic.  Right Ear: Hearing, tympanic membrane, external ear and ear canal normal.  Left Ear: Hearing, tympanic membrane, external ear and ear canal normal.  Nose: Nose normal.  Mouth/Throat: Uvula is midline and oropharynx is clear and moist.  Eyes: Conjunctivae and EOM are normal. Pupils are equal, round, and reactive to light.  Neck: Normal range of motion and full passive range of motion without pain. Neck supple. No JVD present. Carotid bruit is not present. No thyroid mass and no thyromegaly present.  Cardiovascular: Normal rate, normal heart sounds and intact distal pulses.   No murmur heard. Pulmonary/Chest: Effort normal and breath sounds normal. Right breast exhibits no inverted nipple, no mass, no nipple discharge, no skin change and no tenderness. Left breast exhibits no inverted nipple, no mass, no nipple discharge, no skin change and no tenderness.  Abdominal: Soft. Bowel sounds are normal. She exhibits no mass. There is no tenderness.  Genitourinary: Vagina normal and uterus normal. No breast swelling, tenderness, discharge or bleeding.  bimanual exam-No adnexal masses or tenderness. Vaginal cuff intact Large cyctocele  Musculoskeletal: Normal range of motion.  Lymphadenopathy:    She has no cervical adenopathy.  Neurological: She is alert and oriented to person, place, and time.  Skin: Skin is warm and dry.  Psychiatric: She has a normal mood and affect. Her behavior is normal. Judgment and thought content normal.   BP 130/74 mmHg  Pulse 90  Temp(Src) 96.6 F (35.9 C) (Oral)  Ht 5\' 5"  (1.651 m)  Wt 140 lb (63.504 kg)  BMI 23.30 kg/m2  EKG- NSR- Mary-Margaret Daphine DeutscherMartin, FNP  Chest X ray- mild bronchitic changes-Preliminary reading by Ronnald Collum, FNP  [facility]       Assessment & Plan:   1. Annual physical exam - POCT urinalysis dipstick - POCT UA - Microscopic Only - POCT CBC - Thyroid Panel With TSH  2. Encounter for routine gynecological examination - Pap IG (Image Guided)  3. Essential hypertension Low NA+ diet - CMP14+EGFR - EKG 12-Lead - lisinopril-hydrochlorothiazide (PRINZIDE,ZESTORETIC) 20-25 MG per tablet; Take 1 tablet by mouth daily.  Dispense: 90 tablet; Refill: 1  4. Gastroesophageal reflux disease without esophagitis Avoid spicy and fatty foods  5. Hyperlipidemia Watch fat in diet - NMR, lipoprofile  6. Chronic back pain Moist heat Rest when flares up - HYDROcodone-acetaminophen (NORCO/VICODIN) 5-325 MG per tablet; TAKE ONE TABLET BY MOUTH EVERY 6 HOURS AS NEEDED FOR PAIN  Dispense: 30 tablet; Refill: 0  7. Glaucoma keep follow up appointments with eye specialist  8. Smoker STOP smoking - DG Chest 2 View; Future  9. Screening for osteoporosis Weight bearing exercises - DG Bone Density; Future    Labs pending Health maintenance reviewed Diet and exercise encouraged Continue all meds Follow up  In 6 months   [place], FNP

## 2014-01-25 NOTE — Patient Instructions (Signed)

## 2014-01-26 LAB — NMR, LIPOPROFILE
Cholesterol: 150 mg/dL (ref 100–199)
HDL CHOLESTEROL BY NMR: 72 mg/dL (ref >39)
HDL PARTICLE NUMBER: 45 umol/L (ref >=30.5)
LDL Particle Number: 494 nmol/L (ref <1000)
LDL Size: 20.3 nm (ref >20.5)
LDL-C: 58 mg/dL (ref 0–99)
LP-IR Score: 44 (ref <=45)
Small LDL Particle Number: 250 nmol/L (ref <=527)
TRIGLYCERIDES BY NMR: 99 mg/dL (ref 0–149)

## 2014-01-26 LAB — CMP14+EGFR
ALBUMIN: 4.7 g/dL (ref 3.6–4.8)
ALT: 14 IU/L (ref 0–32)
AST: 27 IU/L (ref 0–40)
Albumin/Globulin Ratio: 2 (ref 1.1–2.5)
Alkaline Phosphatase: 51 IU/L (ref 39–117)
BUN/Creatinine Ratio: 15 (ref 11–26)
BUN: 15 mg/dL (ref 8–27)
CO2: 24 mmol/L (ref 18–29)
CREATININE: 1.02 mg/dL — AB (ref 0.57–1.00)
Calcium: 9.9 mg/dL (ref 8.7–10.3)
Chloride: 96 mmol/L — ABNORMAL LOW (ref 97–108)
GFR calc Af Amer: 65 mL/min/{1.73_m2} (ref >59)
GFR calc non Af Amer: 56 mL/min/{1.73_m2} — ABNORMAL LOW (ref >59)
Globulin, Total: 2.3 g/dL (ref 1.5–4.5)
Glucose: 92 mg/dL (ref 65–99)
Potassium: 4 mmol/L (ref 3.5–5.2)
Sodium: 137 mmol/L (ref 134–144)
TOTAL PROTEIN: 7 g/dL (ref 6.0–8.5)
Total Bilirubin: 0.2 mg/dL (ref 0.0–1.2)

## 2014-01-26 LAB — THYROID PANEL WITH TSH
FREE THYROXINE INDEX: 2.1 (ref 1.2–4.9)
T3 Uptake Ratio: 28 % (ref 24–39)
T4, Total: 7.4 ug/dL (ref 4.5–12.0)
TSH: 2.34 u[IU]/mL (ref 0.450–4.500)

## 2014-01-27 LAB — PAP IG (IMAGE GUIDED): PAP SMEAR COMMENT: 0

## 2014-01-28 ENCOUNTER — Telehealth: Payer: Self-pay

## 2014-01-28 NOTE — Telephone Encounter (Signed)
Pt aware of results and need for repeat  In 3 months

## 2014-01-28 NOTE — Telephone Encounter (Signed)
-----   Message from Bennie PieriniMary-Margaret Martin, FNP sent at 01/28/2014 12:45 PM EST ----- PAP normal- repeat in 2 years

## 2014-02-09 ENCOUNTER — Other Ambulatory Visit: Payer: Medicare Other

## 2014-02-09 ENCOUNTER — Ambulatory Visit: Payer: Medicare Other

## 2014-02-18 ENCOUNTER — Telehealth: Payer: Self-pay | Admitting: Family Medicine

## 2014-02-18 NOTE — Telephone Encounter (Signed)
Stp she has had cough and chest congestion x 3 days, doesn't feel well enough to get out and come in, no fever,no chills, no h/a. She is taking otc robitussin and mucinex, advised to increase water intake, is there anything we can give her? Please advise.

## 2014-02-20 NOTE — Telephone Encounter (Signed)
Please call this patient in a Z-Pak if she is not better by Monday morning. Tell her to continue to drink plenty of fluids and take cough and congestion medicine regularly

## 2014-02-21 MED ORDER — AZITHROMYCIN 250 MG PO TABS: ORAL_TABLET | ORAL | Status: DC

## 2014-02-21 NOTE — Telephone Encounter (Signed)
Detailed message left for patient.

## 2014-03-29 ENCOUNTER — Other Ambulatory Visit: Payer: Self-pay | Admitting: Nurse Practitioner

## 2014-03-31 DIAGNOSIS — H524 Presbyopia: Secondary | ICD-10-CM | POA: Diagnosis not present

## 2014-03-31 DIAGNOSIS — H4011X1 Primary open-angle glaucoma, mild stage: Secondary | ICD-10-CM | POA: Diagnosis not present

## 2014-03-31 DIAGNOSIS — H5203 Hypermetropia, bilateral: Secondary | ICD-10-CM | POA: Diagnosis not present

## 2014-03-31 DIAGNOSIS — Z961 Presence of intraocular lens: Secondary | ICD-10-CM | POA: Diagnosis not present

## 2014-03-31 DIAGNOSIS — H52223 Regular astigmatism, bilateral: Secondary | ICD-10-CM | POA: Diagnosis not present

## 2014-04-05 ENCOUNTER — Other Ambulatory Visit: Payer: Self-pay | Admitting: Nurse Practitioner

## 2014-04-05 DIAGNOSIS — M549 Dorsalgia, unspecified: Principal | ICD-10-CM

## 2014-04-05 DIAGNOSIS — G8929 Other chronic pain: Secondary | ICD-10-CM

## 2014-04-06 MED ORDER — HYDROCODONE-ACETAMINOPHEN 5-325 MG PO TABS: ORAL_TABLET | ORAL | Status: DC

## 2014-04-06 NOTE — Telephone Encounter (Signed)
Patient aware rx ready to be picked up 

## 2014-04-06 NOTE — Telephone Encounter (Signed)
norco rx ready for pick up  

## 2014-04-13 ENCOUNTER — Ambulatory Visit (INDEPENDENT_AMBULATORY_CARE_PROVIDER_SITE_OTHER): Payer: Medicare Other

## 2014-04-13 ENCOUNTER — Encounter: Payer: Self-pay | Admitting: Pharmacist

## 2014-04-13 ENCOUNTER — Ambulatory Visit (INDEPENDENT_AMBULATORY_CARE_PROVIDER_SITE_OTHER): Payer: Medicare Other | Admitting: Pharmacist

## 2014-04-13 VITALS — Ht 65.5 in | Wt 131.0 lb

## 2014-04-13 DIAGNOSIS — Z13820 Encounter for screening for osteoporosis: Secondary | ICD-10-CM

## 2014-04-13 DIAGNOSIS — M8580 Other specified disorders of bone density and structure, unspecified site: Secondary | ICD-10-CM

## 2014-04-13 MED ORDER — ALENDRONATE SODIUM 35 MG PO TABS: 35.0000 mg | ORAL_TABLET | ORAL | Status: DC

## 2014-04-13 NOTE — Progress Notes (Signed)
Patient ID: Eliezer ChampagneCarolyn F Dagley, female   DOB: 18-Dec-1945, 69 y.o.   MRN: [id number] [facility] Current Height: Height: 5' 5.5" (166.4 cm)      Max Lifetime Height:  5\' 6"  Current Weight: Weight: 131 lb (59.421 kg)       Ethnicity:Caucasian    HPI: Does pt already have a diagnosis of:  Osteopenia?  Yes Osteoporosis?  No  Back Pain?  Yes       Kyphosis?  No Prior fracture?  Yes - ankle in 40's Med(s) for Osteoporosis/Osteopenia:  none Med(s) previously tried for Osteoporosis/Osteopenia:  evista - tried for 1 month but did not continue - too expensive.                                                             PMH: Age at menopause:  6540's - surgical Hysterectomy?  Yes Oophorectomy?  Yes HRT? Yes - Former.  Type/duration:  premarin Steroid Use?  Yes - Current.  Type/duration: inhaled - advair Thyroid med?  No History of cancer?  No History of digestive disorders (ie Crohn's)?  Yes - on chronic PPI Current or previous eating disorders?  No Last Vitamin D Result:  38 (11/06/2010) Last GFR Result:  56 (01/25/2014)   FH/SH: Family history of osteoporosis?  No Parent with history of hip fracture?  Yes - mother Family history of breast cancer?  Yes - sister Exercise?  No Smoking?  Yes - patient not ready to quit Alcohol?  No    Calcium Assessment Calcium Intake  # of servings/day  Calcium mg  Milk (8 oz) 1  x  300  = 300mg   Yogurt (4 oz) 0 x  200 = 0  Cheese (1 oz) 0 x  200 = 0  Other Calcium sources   250mg   Ca supplement 600mg  qd = 600mg    Estimated calcium intake per day 1150mg     DEXA Results Date of Test T-Score for AP Spine L1-L4 T-Score for Total Left Hip T-Score for Total Right Hip  04/13/2014 1.6 -2.3 -2.4  01/09/2011 1.1 -2.1 -1.9  12/16/2007 1.1 -2.0 -1.9  09/23/2005 1.0 -2.0 -1.9    Assessment: Osteoporosis with decreased BMD since last visit  Recommendations: 1.  Start  alendronate (FOSAMAX) 35mg  1 tablet weekly - take on empty stomach with full  glass of water.  Do not lie down or recline for  30 minutes after adminsitration 2.  recommend calcium 1200mg  daily through supplementation or diet.  3.  recommend weight bearing exercise - 30 minutes at least 4 days per week.   4.  Counseled and educated about fall risk and prevention.  Recheck DEXA:  2 years  Time spent counseling patient:  30 minutes   Henrene Pastorammy Jawanza Zambito, PharmD, CPP

## 2014-04-13 NOTE — Patient Instructions (Signed)
Fall Prevention and Home Safety Falls cause injuries and can affect all age groups. It is possible to use preventive measures to significantly decrease the likelihood of falls. There are many simple measures which can make your home safer and prevent falls. OUTDOORS  Repair cracks and edges of walkways and driveways.  Remove high doorway thresholds.  Trim shrubbery on the main path into your home.  Have good outside lighting.  Clear walkways of tools, rocks, debris, and clutter.  Check that handrails are not broken and are securely fastened. Both sides of steps should have handrails.  Have leaves, snow, and ice cleared regularly.  Use sand or salt on walkways during winter months.  In the garage, clean up grease or oil spills. BATHROOM  Install night lights.  Install grab bars by the toilet and in the tub and shower.  Use non-skid mats or decals in the tub or shower.  Place a plastic non-slip stool in the shower to sit on, if needed.  Keep floors dry and clean up all water on the floor immediately.  Remove soap buildup in the tub or shower on a regular basis.  Secure bath mats with non-slip, double-sided rug tape.  Remove throw rugs and tripping hazards from the floors. BEDROOMS  Install night lights.  Make sure a bedside light is easy to reach.  Do not use oversized bedding.  Keep a telephone by your bedside.  Have a firm chair with side arms to use for getting dressed.  Remove throw rugs and tripping hazards from the floor. KITCHEN  Keep handles on pots and pans turned toward the center of the stove. Use back burners when possible.  Clean up spills quickly and allow time for drying.  Avoid walking on wet floors.  Avoid hot utensils and knives.  Position shelves so they are not too high or low.  Place commonly used objects within easy reach.  If necessary, use a sturdy step stool with a grab bar when reaching.  Keep electrical cables out of the  way.  Do not use floor polish or wax that makes floors slippery. If you must use wax, use non-skid floor wax.  Remove throw rugs and tripping hazards from the floor. STAIRWAYS  Never leave objects on stairs.  Place handrails on both sides of stairways and use them. Fix any loose handrails. Make sure handrails on both sides of the stairways are as long as the stairs.  Check carpeting to make sure it is firmly attached along stairs. Make repairs to worn or loose carpet promptly.  Avoid placing throw rugs at the top or bottom of stairways, or properly secure the rug with carpet tape to prevent slippage. Get rid of throw rugs, if possible.  Have an electrician put in a light switch at the top and bottom of the stairs. OTHER FALL PREVENTION TIPS  Wear low-heel or rubber-soled shoes that are supportive and fit well. Wear closed toe shoes.  When using a stepladder, make sure it is fully opened and both spreaders are firmly locked. Do not climb a closed stepladder.  Add color or contrast paint or tape to grab bars and handrails in your home. Place contrasting color strips on first and last steps.  Learn and use mobility aids as needed. Install an electrical emergency response system.  Turn on lights to avoid dark areas. Replace light bulbs that burn out immediately. Get light switches that glow.  Arrange furniture to create clear pathways. Keep furniture in the same place.    Firmly attach carpet with non-skid or double-sided tape.  Eliminate uneven floor surfaces.  Select a carpet pattern that does not visually hide the edge of steps.  Be aware of all pets. OTHER HOME SAFETY TIPS  Set the water temperature for 120 F (48.8 C).  Keep emergency numbers on or near the telephone.  Keep smoke detectors on every level of the home and near sleeping areas. Document Released: 02/15/2002 Document Revised: 08/27/2011 Document Reviewed: 05/17/2011 [employer] Patient Information 2015  [employer]. This information is not intended to replace advice given to you by your health care provider. Make sure you discuss any questions you have with your health care provider.                Exercise for Strong Bones  Exercise is important to build and maintain strong bones / bone density.  There are 2 types of exercises that are important to building and maintaining strong bones:  Weight- bearing and muscle-stregthening.  Weight-bearing Exercises  These exercises include activities that make you move against gravity while staying upright. Weight-bearing exercises can be high-impact or low-impact.  High-impact weight-bearing exercises help build bones and keep them strong. If you have broken a bone due to osteoporosis or are at risk of breaking a bone, you may need to avoid high-impact exercises. If you're not sure, you should check with your healthcare provider.  Examples of high-impact weight-bearing exercises are: Dancing  Doing high-impact aerobics  Hiking  Jogging/running  Jumping Rope  Stair climbing  Tennis  Low-impact weight-bearing exercises can also help keep bones strong and are a safe alternative if you cannot do high-impact exercises.   Examples of low-impact weight-bearing exercises are: Using elliptical training machines  Doing low-impact aerobics  Using stair-step machines  Fast walking on a treadmill or outside   Muscle-Strengthening Exercises These exercises include activities where you move your body, a weight or some other resistance against gravity. They are also known as resistance exercises and include: Lifting weights  Using elastic exercise bands  Using weight machines  Lifting your own body weight  Functional movements, such as standing and rising up on your toes  Yoga and Pilates can also improve strength, balance and flexibility. However, certain positions may not be safe for people with osteoporosis or those at increased risk of broken  bones. For example, exercises that have you bend forward may increase the chance of breaking a bone in the spine.   Non-Impact Exercises There are other types of exercises that can help prevent falls.  Non-impact exercises can help you to improve balance, posture and how well you move in everyday activities. Some of these exercises include: Balance exercises that strengthen your legs and test your balance, such as Tai Chi, can decrease your risk of falls.  Posture exercises that improve your posture and reduce rounded or "sloping" shoulders can help you decrease the chance of breaking a bone, especially in the spine.  Functional exercises that improve how well you move can help you with everyday activities and decrease your chance of falling and breaking a bone. For example, if you have trouble getting up from a chair or climbing stairs, you should do these activities as exercises.   **A physical therapist can teach you balance, posture and functional exercises. He/she can also help you learn which exercises are safe and appropriate for you.  [facility] has a physical therapy office in [place] in front of our office and referrals can be made for assessments   and treatment as needed and strength and balance training.  If you would like to have an assessment with [facility] and our physical therapy team please let a nurse or provider know.    

## 2014-04-25 ENCOUNTER — Other Ambulatory Visit: Payer: Self-pay | Admitting: Nurse Practitioner

## 2014-06-07 DIAGNOSIS — Z1231 Encounter for screening mammogram for malignant neoplasm of breast: Secondary | ICD-10-CM | POA: Diagnosis not present

## 2014-06-30 DIAGNOSIS — H4011X1 Primary open-angle glaucoma, mild stage: Secondary | ICD-10-CM | POA: Diagnosis not present

## 2014-06-30 DIAGNOSIS — Z961 Presence of intraocular lens: Secondary | ICD-10-CM | POA: Diagnosis not present

## 2014-07-07 ENCOUNTER — Other Ambulatory Visit: Payer: Self-pay | Admitting: Nurse Practitioner

## 2014-07-08 ENCOUNTER — Other Ambulatory Visit: Payer: Self-pay | Admitting: *Deleted

## 2014-07-08 MED ORDER — FLUTICASONE-SALMETEROL 250-50 MCG/DOSE IN AEPB: 1.0000 | INHALATION_SPRAY | Freq: Two times a day (BID) | RESPIRATORY_TRACT | Status: DC

## 2014-07-15 ENCOUNTER — Encounter: Payer: Self-pay | Admitting: Nurse Practitioner

## 2014-07-21 DIAGNOSIS — M19011 Primary osteoarthritis, right shoulder: Secondary | ICD-10-CM | POA: Diagnosis not present

## 2014-07-21 DIAGNOSIS — M19041 Primary osteoarthritis, right hand: Secondary | ICD-10-CM | POA: Diagnosis not present

## 2014-07-21 DIAGNOSIS — M75101 Unspecified rotator cuff tear or rupture of right shoulder, not specified as traumatic: Secondary | ICD-10-CM | POA: Diagnosis not present

## 2014-08-19 ENCOUNTER — Encounter: Payer: Self-pay | Admitting: Physician Assistant

## 2014-08-19 ENCOUNTER — Ambulatory Visit (INDEPENDENT_AMBULATORY_CARE_PROVIDER_SITE_OTHER): Payer: Medicare Other | Admitting: Physician Assistant

## 2014-08-19 VITALS — BP 118/61 | HR 90 | Temp 97.2°F | Wt 129.0 lb

## 2014-08-19 DIAGNOSIS — E785 Hyperlipidemia, unspecified: Secondary | ICD-10-CM

## 2014-08-19 DIAGNOSIS — K219 Gastro-esophageal reflux disease without esophagitis: Secondary | ICD-10-CM | POA: Diagnosis not present

## 2014-08-19 DIAGNOSIS — L814 Other melanin hyperpigmentation: Secondary | ICD-10-CM

## 2014-08-19 DIAGNOSIS — I10 Essential (primary) hypertension: Secondary | ICD-10-CM | POA: Diagnosis not present

## 2014-08-19 DIAGNOSIS — M810 Age-related osteoporosis without current pathological fracture: Secondary | ICD-10-CM

## 2014-08-19 DIAGNOSIS — M549 Dorsalgia, unspecified: Secondary | ICD-10-CM | POA: Diagnosis not present

## 2014-08-19 DIAGNOSIS — J4520 Mild intermittent asthma, uncomplicated: Secondary | ICD-10-CM | POA: Diagnosis not present

## 2014-08-19 DIAGNOSIS — G8929 Other chronic pain: Secondary | ICD-10-CM | POA: Diagnosis not present

## 2014-08-19 DIAGNOSIS — Z0000 Encounter for general adult medical examination without abnormal findings: Secondary | ICD-10-CM

## 2014-08-19 LAB — POCT CBC
Granulocyte percent: 72.7 %G (ref 37–80)
HEMATOCRIT: 42.2 % (ref 37.7–47.9)
Hemoglobin: 13.1 g/dL (ref 12.2–16.2)
LYMPH, POC: 1.3 (ref 0.6–3.4)
MCH, POC: 29.3 pg (ref 27–31.2)
MCHC: 31.1 g/dL — AB (ref 31.8–35.4)
MCV: 94.2 fL (ref 80–97)
MPV: 6.4 fL (ref 0–99.8)
PLATELET COUNT, POC: 537 10*3/uL — AB (ref 142–424)
POC Granulocyte: 5.3 (ref 2–6.9)
POC LYMPH PERCENT: 17.4 %L (ref 10–50)
RBC: 4.48 M/uL (ref 4.04–5.48)
RDW, POC: 12.5 %
WBC: 7.3 10*3/uL (ref 4.6–10.2)

## 2014-08-19 MED ORDER — OMEPRAZOLE 20 MG PO CPDR: 20.0000 mg | DELAYED_RELEASE_CAPSULE | Freq: Two times a day (BID) | ORAL | Status: DC

## 2014-08-19 MED ORDER — FENOFIBRATE MICRONIZED 134 MG PO CAPS: 134.0000 mg | ORAL_CAPSULE | Freq: Every day | ORAL | Status: DC

## 2014-08-19 MED ORDER — LISINOPRIL-HYDROCHLOROTHIAZIDE 20-25 MG PO TABS: 1.0000 | ORAL_TABLET | Freq: Every day | ORAL | Status: DC

## 2014-08-19 MED ORDER — ALENDRONATE SODIUM 35 MG PO TABS: 35.0000 mg | ORAL_TABLET | ORAL | Status: DC

## 2014-08-19 MED ORDER — PRAVASTATIN SODIUM 40 MG PO TABS: 40.0000 mg | ORAL_TABLET | Freq: Every day | ORAL | Status: DC

## 2014-08-19 MED ORDER — HYDROCODONE-ACETAMINOPHEN 5-325 MG PO TABS: ORAL_TABLET | ORAL | Status: DC

## 2014-08-19 MED ORDER — FLUTICASONE-SALMETEROL 250-50 MCG/DOSE IN AEPB: 1.0000 | INHALATION_SPRAY | Freq: Two times a day (BID) | RESPIRATORY_TRACT | Status: DC

## 2014-08-19 NOTE — Progress Notes (Signed)
   Subjective:    Patient ID: Samantha Huerta, female    DOB: February 09, 1946, 69 y.o.   MRN: [id number]  HPI 69 y/o female presents for refills on medication and labs. She has comorbid HTN, chronic asthma, back pain, hyperlipidemia, GERD, and glaucoma. She is not having any new problems today   She has a lesion on her right cheek bone that she is concerned about today     Review of Systems  Constitutional: Negative.   HENT: Negative.   Eyes: Negative.   Respiratory: Negative.   Cardiovascular: Negative.   Gastrointestinal: Negative.   Genitourinary: Positive for urgency.       Nocturia   Musculoskeletal: Positive for back pain.       Left Wrist and back pain   All other systems reviewed and are negative.      Objective:   Physical Exam  Constitutional: She is oriented to person, place, and time. She appears well-developed and well-nourished.  Cardiovascular: Normal rate, regular rhythm and normal heart sounds.  Exam reveals no gallop and no friction rub.   No murmur heard. Pulmonary/Chest: Effort normal and breath sounds normal. No respiratory distress. She has no wheezes. She has no rales. She exhibits no tenderness.  Musculoskeletal: Normal range of motion. She exhibits edema (right knee). She exhibits no tenderness.  Neurological: She is alert and oriented to person, place, and time.  Skin:  Mildly hyperkeratotic lentigo on right cheek, not a concern at this time   Psychiatric: She has a normal mood and affect. Her behavior is normal. Judgment and thought content normal.  Nursing note and vitals reviewed.         Assessment & Plan:  1. Essential hypertension  - POCT CBC - CMP14+EGFR - lisinopril-hydrochlorothiazide (PRINZIDE,ZESTORETIC) 20-25 MG per tablet; Take 1 tablet by mouth daily.  Dispense: 90 tablet; Refill: 1  2. Hyperlipidemia  - Lipid panel - fenofibrate micronized (LOFIBRA) 134 MG capsule; Take 1 capsule (134 mg total) by mouth daily before breakfast.   Dispense: 30 capsule; Refill: 5 - pravastatin (PRAVACHOL) 40 MG tablet; Take 1 tablet (40 mg total) by mouth daily.  Dispense: 90 tablet; Refill: 1  3. Gastroesophageal reflux disease without esophagitis  - omeprazole (PRILOSEC) 20 MG capsule; Take 1 capsule (20 mg total) by mouth 2 (two) times daily.  Dispense: 30 capsule; Refill: 5  4. Lentigo of right cheek  - no treatment needed at this time. Continue to monitor  5. Chronic back pain  - HYDROcodone-acetaminophen (NORCO/VICODIN) 5-325 MG per tablet; TAKE ONE TABLET BY MOUTH EVERY 6 HOURS AS NEEDED FOR PAIN  Dispense: 30 tablet; Refill: 0  6. Asthma, chronic, mild intermittent, uncomplicated  - Fluticasone-Salmeterol (ADVAIR DISKUS) 250-50 MCG/DOSE AEPB; Inhale 1 puff into the lungs 2 (two) times daily.  Dispense: 60 each; Refill: 1  7. Osteoporosis  - alendronate (FOSAMAX) 35 MG tablet; Take 1 tablet (35 mg total) by mouth every 7 (seven) days. Take with a full glass of water on an empty stomach. Do not lie down or recline for 30 minutes after administration.  Dispense: 4 tablet; Refill: 5   Continue all meds Labs pending Health Maintenance reviewed Diet and exercise encouraged RTO 6 months   Chantil Bari A. Benjamin Stain PA-C

## 2014-08-20 LAB — CMP14+EGFR
ALBUMIN: 4.6 g/dL (ref 3.5–4.8)
ALT: 16 IU/L (ref 0–32)
AST: 28 IU/L (ref 0–40)
Albumin/Globulin Ratio: 2.1 (ref 1.1–2.5)
Alkaline Phosphatase: 52 IU/L (ref 39–117)
BILIRUBIN TOTAL: 0.5 mg/dL (ref 0.0–1.2)
BUN / CREAT RATIO: 16 (ref 11–26)
BUN: 14 mg/dL (ref 8–27)
CO2: 25 mmol/L (ref 18–29)
CREATININE: 0.9 mg/dL (ref 0.57–1.00)
Calcium: 10 mg/dL (ref 8.7–10.3)
Chloride: 96 mmol/L — ABNORMAL LOW (ref 97–108)
GFR calc Af Amer: 75 mL/min/{1.73_m2} (ref >59)
GFR calc non Af Amer: 65 mL/min/{1.73_m2} (ref >59)
GLUCOSE: 94 mg/dL (ref 65–99)
Globulin, Total: 2.2 g/dL (ref 1.5–4.5)
Potassium: 4 mmol/L (ref 3.5–5.2)
Sodium: 136 mmol/L (ref 134–144)
TOTAL PROTEIN: 6.8 g/dL (ref 6.0–8.5)

## 2014-08-20 LAB — LIPID PANEL
Chol/HDL Ratio: 1.8 ratio units (ref 0.0–4.4)
Cholesterol, Total: 162 mg/dL (ref 100–199)
HDL: 91 mg/dL (ref >39)
LDL Calculated: 61 mg/dL (ref 0–99)
TRIGLYCERIDES: 48 mg/dL (ref 0–149)
VLDL Cholesterol Cal: 10 mg/dL (ref 5–40)

## 2014-08-24 NOTE — Progress Notes (Signed)
Patient aware.

## 2014-08-26 ENCOUNTER — Ambulatory Visit: Payer: Medicare Other | Admitting: *Deleted

## 2014-09-02 ENCOUNTER — Other Ambulatory Visit: Payer: Medicare Other

## 2014-09-02 DIAGNOSIS — Z1212 Encounter for screening for malignant neoplasm of rectum: Secondary | ICD-10-CM | POA: Diagnosis not present

## 2014-09-02 NOTE — Progress Notes (Signed)
Lab only 

## 2014-09-04 LAB — FECAL OCCULT BLOOD, IMMUNOCHEMICAL: Fecal Occult Bld: NEGATIVE

## 2014-09-06 ENCOUNTER — Telehealth: Payer: Self-pay | Admitting: *Deleted

## 2014-09-06 NOTE — Telephone Encounter (Signed)
-----   Message from Chelsea Primusiffany A Gann, PA-C sent at 09/05/2014  5:32 PM EDT ----- FOB negative Tiffany A. Chauncey ReadingGann PA-C

## 2014-09-06 NOTE — Telephone Encounter (Signed)
Aware of FOBT results.

## 2014-10-18 ENCOUNTER — Telehealth: Payer: Self-pay | Admitting: Physician Assistant

## 2014-10-18 ENCOUNTER — Other Ambulatory Visit: Payer: Self-pay | Admitting: Physician Assistant

## 2014-10-18 DIAGNOSIS — M549 Dorsalgia, unspecified: Principal | ICD-10-CM

## 2014-10-18 DIAGNOSIS — G8929 Other chronic pain: Secondary | ICD-10-CM

## 2014-10-18 MED ORDER — HYDROCODONE-ACETAMINOPHEN 5-325 MG PO TABS: ORAL_TABLET | ORAL | Status: DC

## 2014-10-18 NOTE — Telephone Encounter (Signed)
Prescription printed Drema Eddington A. Chauncey Reading PA-C

## 2014-10-18 NOTE — Telephone Encounter (Signed)
Last seen and filled on 08/19/14. Rx will print

## 2014-10-19 NOTE — Telephone Encounter (Signed)
Detailed message left for patient that rx is ready to be picked up 

## 2014-12-09 ENCOUNTER — Ambulatory Visit (INDEPENDENT_AMBULATORY_CARE_PROVIDER_SITE_OTHER): Payer: Medicare Other

## 2014-12-09 DIAGNOSIS — Z23 Encounter for immunization: Secondary | ICD-10-CM | POA: Diagnosis not present

## 2014-12-20 ENCOUNTER — Telehealth: Payer: Self-pay | Admitting: Nurse Practitioner

## 2014-12-20 DIAGNOSIS — G8929 Other chronic pain: Secondary | ICD-10-CM

## 2014-12-20 DIAGNOSIS — M549 Dorsalgia, unspecified: Principal | ICD-10-CM

## 2014-12-20 MED ORDER — HYDROCODONE-ACETAMINOPHEN 5-325 MG PO TABS: ORAL_TABLET | ORAL | Status: DC

## 2014-12-20 NOTE — Telephone Encounter (Signed)
Pain rx ready for pick up  

## 2014-12-21 NOTE — Telephone Encounter (Signed)
Patient aware that rx is ready to be picked up.  

## 2015-01-12 DIAGNOSIS — H1851 Endothelial corneal dystrophy: Secondary | ICD-10-CM | POA: Diagnosis not present

## 2015-01-12 DIAGNOSIS — Z961 Presence of intraocular lens: Secondary | ICD-10-CM | POA: Diagnosis not present

## 2015-01-12 DIAGNOSIS — H401132 Primary open-angle glaucoma, bilateral, moderate stage: Secondary | ICD-10-CM | POA: Diagnosis not present

## 2015-02-21 ENCOUNTER — Other Ambulatory Visit: Payer: Self-pay | Admitting: Nurse Practitioner

## 2015-02-21 DIAGNOSIS — G8929 Other chronic pain: Secondary | ICD-10-CM

## 2015-02-21 DIAGNOSIS — M549 Dorsalgia, unspecified: Principal | ICD-10-CM

## 2015-02-22 NOTE — Telephone Encounter (Signed)
Please address

## 2015-02-23 MED ORDER — HYDROCODONE-ACETAMINOPHEN 5-325 MG PO TABS: ORAL_TABLET | ORAL | Status: DC

## 2015-02-23 NOTE — Telephone Encounter (Signed)
Patient aware. Patient was also notified that she will need to be seen.

## 2015-02-23 NOTE — Telephone Encounter (Signed)
hydrocodonre rx ready for pick up will have  To call pharmacy and see if symbicort is cheaper

## 2015-03-16 ENCOUNTER — Ambulatory Visit: Payer: Medicare Other | Admitting: Nurse Practitioner

## 2015-03-23 ENCOUNTER — Ambulatory Visit (INDEPENDENT_AMBULATORY_CARE_PROVIDER_SITE_OTHER): Payer: Medicare Other

## 2015-03-23 ENCOUNTER — Ambulatory Visit (INDEPENDENT_AMBULATORY_CARE_PROVIDER_SITE_OTHER): Payer: Medicare Other | Admitting: Nurse Practitioner

## 2015-03-23 ENCOUNTER — Encounter: Payer: Self-pay | Admitting: Nurse Practitioner

## 2015-03-23 VITALS — BP 137/89 | HR 90 | Temp 97.1°F | Ht 65.0 in | Wt 129.0 lb

## 2015-03-23 DIAGNOSIS — Z23 Encounter for immunization: Secondary | ICD-10-CM | POA: Diagnosis not present

## 2015-03-23 DIAGNOSIS — M810 Age-related osteoporosis without current pathological fracture: Secondary | ICD-10-CM | POA: Diagnosis not present

## 2015-03-23 DIAGNOSIS — K219 Gastro-esophageal reflux disease without esophagitis: Secondary | ICD-10-CM

## 2015-03-23 DIAGNOSIS — G8929 Other chronic pain: Secondary | ICD-10-CM

## 2015-03-23 DIAGNOSIS — Z1159 Encounter for screening for other viral diseases: Secondary | ICD-10-CM

## 2015-03-23 DIAGNOSIS — H409 Unspecified glaucoma: Secondary | ICD-10-CM

## 2015-03-23 DIAGNOSIS — M549 Dorsalgia, unspecified: Secondary | ICD-10-CM | POA: Diagnosis not present

## 2015-03-23 DIAGNOSIS — E785 Hyperlipidemia, unspecified: Secondary | ICD-10-CM

## 2015-03-23 DIAGNOSIS — F17200 Nicotine dependence, unspecified, uncomplicated: Secondary | ICD-10-CM

## 2015-03-23 DIAGNOSIS — I10 Essential (primary) hypertension: Secondary | ICD-10-CM

## 2015-03-23 DIAGNOSIS — Z720 Tobacco use: Secondary | ICD-10-CM | POA: Diagnosis not present

## 2015-03-23 DIAGNOSIS — J4520 Mild intermittent asthma, uncomplicated: Secondary | ICD-10-CM | POA: Diagnosis not present

## 2015-03-23 MED ORDER — FENOFIBRATE MICRONIZED 134 MG PO CAPS: 134.0000 mg | ORAL_CAPSULE | Freq: Every day | ORAL | Status: DC

## 2015-03-23 MED ORDER — PRAVASTATIN SODIUM 40 MG PO TABS: 40.0000 mg | ORAL_TABLET | Freq: Every day | ORAL | Status: DC

## 2015-03-23 MED ORDER — HYDROCODONE-ACETAMINOPHEN 5-325 MG PO TABS: ORAL_TABLET | ORAL | Status: DC

## 2015-03-23 MED ORDER — FLUTICASONE-SALMETEROL 250-50 MCG/DOSE IN AEPB: 1.0000 | INHALATION_SPRAY | Freq: Two times a day (BID) | RESPIRATORY_TRACT | Status: DC

## 2015-03-23 MED ORDER — ALENDRONATE SODIUM 35 MG PO TABS: 35.0000 mg | ORAL_TABLET | ORAL | Status: DC

## 2015-03-23 MED ORDER — OMEPRAZOLE 20 MG PO CPDR: 20.0000 mg | DELAYED_RELEASE_CAPSULE | Freq: Two times a day (BID) | ORAL | Status: DC

## 2015-03-23 MED ORDER — LISINOPRIL-HYDROCHLOROTHIAZIDE 20-25 MG PO TABS: 1.0000 | ORAL_TABLET | Freq: Every day | ORAL | Status: DC

## 2015-03-23 NOTE — Patient Instructions (Signed)
Smoking Cessation, Tips for Success If you are ready to quit smoking, congratulations! You have chosen to help yourself be healthier. Cigarettes bring nicotine, tar, carbon monoxide, and other irritants into your body. Your lungs, heart, and blood vessels will be able to work better without these poisons. There are many different ways to quit smoking. Nicotine gum, nicotine patches, a nicotine inhaler, or nicotine nasal spray can help with physical craving. Hypnosis, support groups, and medicines help break the habit of smoking. WHAT THINGS CAN I DO TO MAKE QUITTING EASIER?  Here are some tips to help you quit for good:  Pick a date when you will quit smoking completely. Tell all of your friends and family about your plan to quit on that date.  Do not try to slowly cut down on the number of cigarettes you are smoking. Pick a quit date and quit smoking completely starting on that day.  Throw away all cigarettes.   Clean and remove all ashtrays from your home, work, and car.  On a card, write down your reasons for quitting. Carry the card with you and read it when you get the urge to smoke.  Cleanse your body of nicotine. Drink enough water and fluids to keep your urine clear or pale yellow. Do this after quitting to flush the nicotine from your body.  Learn to predict your moods. Do not let a bad situation be your excuse to have a cigarette. Some situations in your life might tempt you into wanting a cigarette.  Never have "just one" cigarette. It leads to wanting another and another. Remind yourself of your decision to quit.  Change habits associated with smoking. If you smoked while driving or when feeling stressed, try other activities to replace smoking. Stand up when drinking your coffee. Brush your teeth after eating. Sit in a different chair when you read the paper. Avoid alcohol while trying to quit, and try to drink fewer caffeinated beverages. Alcohol and caffeine may urge you to  smoke.  Avoid foods and drinks that can trigger a desire to smoke, such as sugary or spicy foods and alcohol.  Ask people who smoke not to smoke around you.  Have something planned to do right after eating or having a cup of coffee. For example, plan to take a walk or exercise.  Try a relaxation exercise to calm you down and decrease your stress. Remember, you may be tense and nervous for the first 2 weeks after you quit, but this will pass.  Find new activities to keep your hands busy. Play with a pen, coin, or rubber band. Doodle or draw things on paper.  Brush your teeth right after eating. This will help cut down on the craving for the taste of tobacco after meals. You can also try mouthwash.   Use oral substitutes in place of cigarettes. Try using lemon drops, carrots, cinnamon sticks, or chewing gum. Keep them handy so they are available when you have the urge to smoke.  When you have the urge to smoke, try deep breathing.  Designate your home as a nonsmoking area.  If you are a heavy smoker, ask your health care provider about a prescription for nicotine chewing gum. It can ease your withdrawal from nicotine.  Reward yourself. Set aside the cigarette money you save and buy yourself something nice.  Look for support from others. Join a support group or smoking cessation program. Ask someone at home or at work to help you with your plan   to quit smoking.  Always ask yourself, "Do I need this cigarette or is this just a reflex?" Tell yourself, "Today, I choose not to smoke," or "I do not want to smoke." You are reminding yourself of your decision to quit.  Do not replace cigarette smoking with electronic cigarettes (commonly called e-cigarettes). The safety of e-cigarettes is unknown, and some may contain harmful chemicals.  If you relapse, do not give up! Plan ahead and think about what you will do the next time you get the urge to smoke. HOW WILL I FEEL WHEN I QUIT SMOKING? You  may have symptoms of withdrawal because your body is used to nicotine (the addictive substance in cigarettes). You may crave cigarettes, be irritable, feel very hungry, cough often, get headaches, or have difficulty concentrating. The withdrawal symptoms are only temporary. They are strongest when you first quit but will go away within 10-14 days. When withdrawal symptoms occur, stay in control. Think about your reasons for quitting. Remind yourself that these are signs that your body is healing and getting used to being without cigarettes. Remember that withdrawal symptoms are easier to treat than the major diseases that smoking can cause.  Even after the withdrawal is over, expect periodic urges to smoke. However, these cravings are generally short lived and will go away whether you smoke or not. Do not smoke! WHAT RESOURCES ARE AVAILABLE TO HELP ME QUIT SMOKING? Your health care provider can direct you to community resources or hospitals for support, which may include:  Group support.  Education.  Hypnosis.  Therapy.   This information is not intended to replace advice given to you by your health care provider. Make sure you discuss any questions you have with your health care provider.   Document Released: 11/24/2003 Document Revised: 03/18/2014 Document Reviewed: 08/13/2012 Elsevier Interactive Patient Education 2016 Elsevier Inc.  

## 2015-03-23 NOTE — Progress Notes (Signed)
Subjective:    Patient ID: Samantha Huerta, female    DOB: 1945/10/10, 70 y.o.   MRN: [id number]  Patient here today for follow up- no complaints or changes since last visit.  Outpatient Encounter Prescriptions as of 03/23/2015  Medication Sig  . alendronate (FOSAMAX) 35 MG tablet Take 1 tablet (35 mg total) by mouth every 7 (seven) days. Take with a full glass of water on an empty stomach. Do not lie down or recline for 30 minutes after administration.  . brinzolamide (AZOPT) 1 % ophthalmic suspension Place 1 drop into both eyes 2 (two) times daily.  . fenofibrate micronized (LOFIBRA) 134 MG capsule Take 1 capsule (134 mg total) by mouth daily before breakfast.  . Fluticasone-Salmeterol (ADVAIR DISKUS) 250-50 MCG/DOSE AEPB Inhale 1 puff into the lungs 2 (two) times daily.  Marland Kitchen HYDROcodone-acetaminophen (NORCO/VICODIN) 5-325 MG tablet TAKE ONE TABLET BY MOUTH EVERY 6 HOURS AS NEEDED FOR PAIN  . latanoprost (XALATAN) 0.005 % ophthalmic solution Place 1 drop into both eyes at bedtime.  Marland Kitchen lisinopril-hydrochlorothiazide (PRINZIDE,ZESTORETIC) 20-25 MG per tablet Take 1 tablet by mouth daily.  Marland Kitchen omeprazole (PRILOSEC) 20 MG capsule Take 1 capsule (20 mg total) by mouth 2 (two) times daily.  . pravastatin (PRAVACHOL) 40 MG tablet Take 1 tablet (40 mg total) by mouth daily.   No facility-administered encounter medications on file as of 03/23/2015.     Hypertension This is a chronic problem. The current episode started more than 1 year ago. The problem is unchanged. The problem is controlled. Pertinent negatives include no blurred vision, chest pain, headaches, peripheral edema, shortness of breath or sweats. Risk factors for coronary artery disease include dyslipidemia and post-menopausal state. Past treatments include ACE inhibitors and diuretics. The current treatment provides significant improvement. Compliance problems include diet and exercise.   Hyperlipidemia This is a chronic problem. The  current episode started more than 1 year ago. The problem is controlled. Recent lipid tests were reviewed and are normal. Pertinent negatives include no chest pain or shortness of breath. Current antihyperlipidemic treatment includes fibric acid derivatives and statins. The current treatment provides moderate improvement of lipids. Compliance problems include adherence to diet and adherence to exercise.  Risk factors for coronary artery disease include hypertension and post-menopausal.  Gerd Omeprazole OTC- Worknig well no symptoms when she takes COPD Uses inhalers dialy- under control- Patient still smoking Chronic back pain Thinks it comes from working and standing on cement floor all day long. Takes norco but  only when needed. glaucoma Currently on eye drops- sees eye doctor 2x a year- has follow up appointment in April.    Review of Systems  Constitutional: Negative for fever, activity change, appetite change and fatigue.  HENT: Negative for congestion and postnasal drip.   Eyes: Negative for blurred vision.  Respiratory: Negative for shortness of breath.   Cardiovascular: Negative for chest pain.  Gastrointestinal: Negative for diarrhea, constipation and blood in stool.  Genitourinary: Negative for dysuria and frequency.  Skin: Negative for rash.  Neurological: Negative for weakness and headaches.  All other systems reviewed and are negative.      Objective:   Physical Exam  Constitutional: She is oriented to person, place, and time. She appears well-developed and well-nourished.  HENT:  Nose: Nose normal.  Mouth/Throat: Oropharynx is clear and moist.  Eyes: EOM are normal.  Neck: Trachea normal, normal range of motion and full passive range of motion without pain. Neck supple. No JVD present. Carotid bruit is not present. No  thyromegaly present.  Cardiovascular: Normal rate, regular rhythm, normal heart sounds and intact distal pulses.  Exam reveals no gallop and no friction  rub.   No murmur heard. Pulmonary/Chest: Effort normal and breath sounds normal. No respiratory distress. She has no wheezes. She has no rales.  Abdominal: Soft. Bowel sounds are normal. She exhibits no distension and no mass. There is no tenderness.  Musculoskeletal: Normal range of motion.  Lymphadenopathy:    She has no cervical adenopathy.  Neurological: She is alert and oriented to person, place, and time. She has normal reflexes.  Skin: Skin is warm and dry.  Psychiatric: She has a normal mood and affect. Her behavior is normal. Judgment and thought content normal.   BP 137/89 mmHg  Pulse 90  Temp(Src) 97.1 F (36.2 C) (Oral)  Ht '5\' 5"'$  (1.651 m)  Wt 129 lb (58.514 kg)  BMI 21.47 kg/m2  Chest xray- Chronic bronchitic changes-Preliminary reading by Ronnald Collum, FNP  [facility]   EKG- NSR- Mary-Margaret Hassell Done, FNP       Assessment & Plan:   1. Asthma, chronic, mild intermittent, uncomplicated Encouraged stop cigarette smoking - Fluticasone-Salmeterol (ADVAIR DISKUS) 250-50 MCG/DOSE AEPB; Inhale 1 puff into the lungs 2 (two) times daily.  Dispense: 60 each; Refill: 1  2. Essential hypertension Do n ot add salt to diet - CMP14+EGFR - lisinopril-hydrochlorothiazide (PRINZIDE,ZESTORETIC) 20-25 MG tablet; Take 1 tablet by mouth daily.  Dispense: 90 tablet; Refill: 1 - EKG 12-Lead  3. Gastroesophageal reflux disease without esophagitis Avoid spicy foods Do not eat 2 hours prior to bedtime - omeprazole (PRILOSEC) 20 MG capsule; Take 1 capsule (20 mg total) by mouth 2 (two) times daily.  Dispense: 30 capsule; Refill: 5  4. Hyperlipidemia Low fat diet - Lipid panel - fenofibrate micronized (LOFIBRA) 134 MG capsule; Take 1 capsule (134 mg total) by mouth daily before breakfast.  Dispense: 30 capsule; Refill: 5 - pravastatin (PRAVACHOL) 40 MG tablet; Take 1 tablet (40 mg total) by mouth daily.  Dispense: 90 tablet; Refill: 1  5. Osteoporosis Weight bearing exercises -  alendronate (FOSAMAX) 35 MG tablet; Take 1 tablet (35 mg total) by mouth every 7 (seven) days. Take with a full glass of water on an empty stomach. Do not lie down or recline for 30 minutes after administration.  Dispense: 4 tablet; Refill: 5  6. Chronic back pain Back strengthening exercises - HYDROcodone-acetaminophen (NORCO/VICODIN) 5-325 MG tablet; TAKE ONE TABLET BY MOUTH EVERY 6 HOURS AS NEEDED FOR PAIN  Dispense: 30 tablet; Refill: 0  7. Glaucoma Keep follow up appointments with opthalmologist  8. Need for hepatitis C screening test - Hepatitis C antibody  9. Smoker STOP smoking - DG Chest 2 View; Future    Labs pending Health maintenance reviewed Diet and exercise encouraged Continue all meds Follow up  In 3 months  [place], FNP

## 2015-03-24 LAB — CMP14+EGFR
ALBUMIN: 4.6 g/dL (ref 3.5–4.8)
ALT: 15 IU/L (ref 0–32)
AST: 26 IU/L (ref 0–40)
Albumin/Globulin Ratio: 2.4 (ref 1.1–2.5)
Alkaline Phosphatase: 41 IU/L (ref 39–117)
BUN/Creatinine Ratio: 17 (ref 11–26)
BUN: 14 mg/dL (ref 8–27)
Bilirubin Total: 0.5 mg/dL (ref 0.0–1.2)
CO2: 25 mmol/L (ref 18–29)
Calcium: 10.1 mg/dL (ref 8.7–10.3)
Chloride: 91 mmol/L — ABNORMAL LOW (ref 96–106)
Creatinine, Ser: 0.84 mg/dL (ref 0.57–1.00)
GFR calc Af Amer: 81 mL/min/{1.73_m2} (ref >59)
GFR calc non Af Amer: 71 mL/min/{1.73_m2} (ref >59)
Globulin, Total: 1.9 g/dL (ref 1.5–4.5)
Glucose: 98 mg/dL (ref 65–99)
Potassium: 4.1 mmol/L (ref 3.5–5.2)
Sodium: 132 mmol/L — ABNORMAL LOW (ref 134–144)
Total Protein: 6.5 g/dL (ref 6.0–8.5)

## 2015-03-24 LAB — LIPID PANEL
CHOLESTEROL TOTAL: 163 mg/dL (ref 100–199)
Chol/HDL Ratio: 1.6 ratio units (ref 0.0–4.4)
HDL: 104 mg/dL (ref >39)
LDL Calculated: 53 mg/dL (ref 0–99)
Triglycerides: 32 mg/dL (ref 0–149)
VLDL Cholesterol Cal: 6 mg/dL (ref 5–40)

## 2015-03-24 LAB — HEPATITIS C ANTIBODY

## 2015-06-01 ENCOUNTER — Ambulatory Visit (INDEPENDENT_AMBULATORY_CARE_PROVIDER_SITE_OTHER): Payer: Medicare Other | Admitting: Nurse Practitioner

## 2015-06-01 ENCOUNTER — Encounter: Payer: Self-pay | Admitting: Nurse Practitioner

## 2015-06-01 VITALS — BP 135/81 | HR 97 | Temp 96.8°F | Ht 65.0 in | Wt 128.0 lb

## 2015-06-01 DIAGNOSIS — E785 Hyperlipidemia, unspecified: Secondary | ICD-10-CM | POA: Diagnosis not present

## 2015-06-01 DIAGNOSIS — M549 Dorsalgia, unspecified: Secondary | ICD-10-CM | POA: Diagnosis not present

## 2015-06-01 DIAGNOSIS — Z01419 Encounter for gynecological examination (general) (routine) without abnormal findings: Secondary | ICD-10-CM

## 2015-06-01 DIAGNOSIS — G8929 Other chronic pain: Secondary | ICD-10-CM | POA: Diagnosis not present

## 2015-06-01 DIAGNOSIS — I10 Essential (primary) hypertension: Secondary | ICD-10-CM

## 2015-06-01 LAB — URINALYSIS, COMPLETE
BILIRUBIN UA: NEGATIVE
Glucose, UA: NEGATIVE
Ketones, UA: NEGATIVE
Leukocytes, UA: NEGATIVE
Nitrite, UA: NEGATIVE
PH UA: 6.5 (ref 5.0–7.5)
Protein, UA: NEGATIVE
RBC UA: NEGATIVE
Specific Gravity, UA: 1.02 (ref 1.005–1.030)
UUROB: 0.2 mg/dL (ref 0.2–1.0)

## 2015-06-01 LAB — MICROSCOPIC EXAMINATION: RBC, UA: NONE SEEN /hpf (ref 0–?)

## 2015-06-01 MED ORDER — FENOFIBRATE 160 MG PO TABS: 160.0000 mg | ORAL_TABLET | Freq: Every day | ORAL | Status: DC

## 2015-06-01 MED ORDER — HYDROCODONE-ACETAMINOPHEN 5-325 MG PO TABS: ORAL_TABLET | ORAL | Status: DC

## 2015-06-01 NOTE — Addendum Note (Signed)
Addended by: Bennie PieriniMARTIN, MARY-MARGARET on: 06/01/2015 04:01 PM   Modules accepted: Orders, Medications, SmartSet

## 2015-06-01 NOTE — Progress Notes (Signed)
Subjective:    Patient ID: Samantha Huerta, female    DOB: 04/28/1945, 70 y.o.   MRN: [id number]  HPI  Pt here for annual physical and pap exam.  Current Outpatient Prescriptions on File Prior to Visit  Medication Sig Dispense Refill  . alendronate (FOSAMAX) 35 MG tablet Take 1 tablet (35 mg total) by mouth every 7 (seven) days. Take with a full glass of water on an empty stomach. Do not lie down or recline for 30 minutes after administration. 4 tablet 5  . brinzolamide (AZOPT) 1 % ophthalmic suspension Place 1 drop into both eyes 2 (two) times daily.    . fenofibrate micronized (LOFIBRA) 134 MG capsule Take 1 capsule (134 mg total) by mouth daily before breakfast. 30 capsule 5  . Fluticasone-Salmeterol (ADVAIR DISKUS) 250-50 MCG/DOSE AEPB Inhale 1 puff into the lungs 2 (two) times daily. 60 each 1  . HYDROcodone-acetaminophen (NORCO/VICODIN) 5-325 MG tablet TAKE ONE TABLET BY MOUTH EVERY 6 HOURS AS NEEDED FOR PAIN 30 tablet 0  . latanoprost (XALATAN) 0.005 % ophthalmic solution Place 1 drop into both eyes at bedtime.    Marland Kitchen. lisinopril-hydrochlorothiazide (PRINZIDE,ZESTORETIC) 20-25 MG tablet Take 1 tablet by mouth daily. 90 tablet 1  . omeprazole (PRILOSEC) 20 MG capsule Take 1 capsule (20 mg total) by mouth 2 (two) times daily. 30 capsule 5  . pravastatin (PRAVACHOL) 40 MG tablet Take 1 tablet (40 mg total) by mouth daily. 90 tablet 1   No current facility-administered medications on file prior to visit.    Review of Systems  Constitutional: Negative.   HENT: Negative.   Eyes: Negative.   Respiratory: Negative.   Cardiovascular: Negative.   Gastrointestinal: Negative.   Endocrine: Negative.   Genitourinary: Negative.   Musculoskeletal: Negative.   Skin: Negative.   Allergic/Immunologic: Negative.   Neurological: Negative.   Hematological: Negative.   Psychiatric/Behavioral: Negative.        Objective:   Physical Exam  Constitutional: She is oriented to person, place, and  time. She appears well-developed and well-nourished.  HENT:  Head: Normocephalic.  Right Ear: Hearing, tympanic membrane, external ear and ear canal normal.  Left Ear: Hearing, tympanic membrane, external ear and ear canal normal.  Nose: Nose normal.  Mouth/Throat: Uvula is midline and oropharynx is clear and moist.  Eyes: Conjunctivae and EOM are normal. Pupils are equal, round, and reactive to light.  Neck: Normal range of motion and full passive range of motion without pain. Neck supple. No JVD present. Carotid bruit is not present. No thyroid mass and no thyromegaly present.  Cardiovascular: Normal rate, normal heart sounds and intact distal pulses.   No murmur heard. Pulmonary/Chest: Effort normal and breath sounds normal. Right breast exhibits no inverted nipple, no mass, no nipple discharge, no skin change and no tenderness. Left breast exhibits no inverted nipple, no mass, no nipple discharge, no skin change and no tenderness.  Abdominal: Soft. Bowel sounds are normal. She exhibits no mass. There is no tenderness.  Genitourinary: Vagina normal and uterus normal. No breast swelling, tenderness, discharge or bleeding.  bimanual exam-No adnexal masses or tenderness. vaginal cuff intact- large cystocele and small rectocele  Musculoskeletal: Normal range of motion.  Lymphadenopathy:    She has no cervical adenopathy.  Neurological: She is alert and oriented to person, place, and time.  Skin: Skin is warm and dry.  Psychiatric: She has a normal mood and affect. Her behavior is normal. Judgment and thought content normal.   BP 135/81 mmHg  Pulse 97  Temp(Src) 96.8 F (36 C) (Oral)  Ht  (1.651 m)  Wt 128 lb (58.06 kg)  BMI 21.30 kg/m2        Assessment & Plan:   1. Chronic back pain   2. Encounter for routine gynecological examination   3. Essential hypertension    Continue with use of pessery Pap pending- repeat in 2 years Follow up in 6 months  Samantha  Daphine Deutscher, FNP

## 2015-06-01 NOTE — Patient Instructions (Signed)
Fall Prevention in the Home  Falls can cause injuries and can affect people from all age groups. There are many simple things that you can do to make your home safe and to help prevent falls. WHAT CAN I DO ON THE OUTSIDE OF MY HOME?  Regularly repair the edges of walkways and driveways and fix any cracks.  Remove high doorway thresholds.  Trim any shrubbery on the main path into your home.  Use bright outdoor lighting.  Clear walkways of debris and clutter, including tools and rocks.  Regularly check that handrails are securely fastened and in good repair. Both sides of any steps should have handrails.  Install guardrails along the edges of any raised decks or porches.  Have leaves, snow, and ice cleared regularly.  Use sand or salt on walkways during winter months.  In the garage, clean up any spills right away, including grease or oil spills. WHAT CAN I DO IN THE BATHROOM?  Use night lights.  Install grab bars by the toilet and in the tub and shower. Do not use towel bars as grab bars.  Use non-skid mats or decals on the floor of the tub or shower.  If you need to sit down while you are in the shower, use a plastic, non-slip stool..  Keep the floor dry. Immediately clean up any water that spills on the floor.  Remove soap buildup in the tub or shower on a regular basis.  Attach bath mats securely with double-sided non-slip rug tape.  Remove throw rugs and other tripping hazards from the floor. WHAT CAN I DO IN THE BEDROOM?  Use night lights.  Make sure that a bedside light is easy to reach.  Do not use oversized bedding that drapes onto the floor.  Have a firm chair that has side arms to use for getting dressed.  Remove throw rugs and other tripping hazards from the floor. WHAT CAN I DO IN THE KITCHEN?   Clean up any spills right away.  Avoid walking on wet floors.  Place frequently used items in easy-to-reach places.  If you need to reach for something  above you, use a sturdy step stool that has a grab bar.  Keep electrical cables out of the way.  Do not use floor polish or wax that makes floors slippery. If you have to use wax, make sure that it is non-skid floor wax.  Remove throw rugs and other tripping hazards from the floor. WHAT CAN I DO IN THE STAIRWAYS?  Do not leave any items on the stairs.  Make sure that there are handrails on both sides of the stairs. Fix handrails that are broken or loose. Make sure that handrails are as long as the stairways.  Check any carpeting to make sure that it is firmly attached to the stairs. Fix any carpet that is loose or worn.  Avoid having throw rugs at the top or bottom of stairways, or secure the rugs with carpet tape to prevent them from moving.  Make sure that you have a light switch at the top of the stairs and the bottom of the stairs. If you do not have them, have them installed. WHAT ARE SOME OTHER FALL PREVENTION TIPS?  Wear closed-toe shoes that fit well and support your feet. Wear shoes that have rubber soles or low heels.  When you use a stepladder, make sure that it is completely opened and that the sides are firmly locked. Have someone hold the ladder while you   are using it. Do not climb a closed stepladder.  Add color or contrast paint or tape to grab bars and handrails in your home. Place contrasting color strips on the first and last steps.  Use mobility aids as needed, such as canes, walkers, scooters, and crutches.  Turn on lights if it is dark. Replace any light bulbs that burn out.  Set up furniture so that there are clear paths. Keep the furniture in the same spot.  Fix any uneven floor surfaces.  Choose a carpet design that does not hide the edge of steps of a stairway.  Be aware of any and all pets.  Review your medicines with your healthcare provider. Some medicines can cause dizziness or changes in blood pressure, which increase your risk of falling. Talk  with your health care provider about other ways that you can decrease your risk of falls. This may include working with a physical therapist or trainer to improve your strength, balance, and endurance.   This information is not intended to replace advice given to you by your health care provider. Make sure you discuss any questions you have with your health care provider.   Document Released: 02/15/2002 Document Revised: 07/12/2014 Document Reviewed: 04/01/2014 Elsevier Interactive Patient Education 2016 [employer] Inc.  

## 2015-06-06 LAB — PAP IG (IMAGE GUIDED): PAP SMEAR COMMENT: 0

## 2015-06-29 DIAGNOSIS — H1851 Endothelial corneal dystrophy: Secondary | ICD-10-CM | POA: Diagnosis not present

## 2015-06-29 DIAGNOSIS — Z961 Presence of intraocular lens: Secondary | ICD-10-CM | POA: Diagnosis not present

## 2015-06-29 DIAGNOSIS — H401132 Primary open-angle glaucoma, bilateral, moderate stage: Secondary | ICD-10-CM | POA: Diagnosis not present

## 2015-07-04 ENCOUNTER — Other Ambulatory Visit: Payer: Self-pay | Admitting: Nurse Practitioner

## 2015-07-04 DIAGNOSIS — G8929 Other chronic pain: Secondary | ICD-10-CM

## 2015-07-04 DIAGNOSIS — M549 Dorsalgia, unspecified: Principal | ICD-10-CM

## 2015-07-04 MED ORDER — HYDROCODONE-ACETAMINOPHEN 5-325 MG PO TABS: ORAL_TABLET | ORAL | Status: DC

## 2015-07-04 NOTE — Telephone Encounter (Signed)
Patient informed that prescription is ready and placed at front desk for pick up

## 2015-07-04 NOTE — Telephone Encounter (Signed)
Hydrocodone rx ready for pick up 

## 2015-08-23 ENCOUNTER — Other Ambulatory Visit: Payer: Self-pay | Admitting: Nurse Practitioner

## 2015-08-23 DIAGNOSIS — G8929 Other chronic pain: Secondary | ICD-10-CM

## 2015-08-23 DIAGNOSIS — M549 Dorsalgia, unspecified: Principal | ICD-10-CM

## 2015-08-23 MED ORDER — HYDROCODONE-ACETAMINOPHEN 5-325 MG PO TABS: ORAL_TABLET | ORAL | Status: DC

## 2015-08-23 NOTE — Telephone Encounter (Signed)
Patient of [facility]. Last office visit was 06-01-15. Rx last filled on 07-04-15 for #[id number]. Please advise. If approved rx wil print and route to Pool A so nurse can call patient to pick up

## 2015-08-23 NOTE — Telephone Encounter (Signed)
Please give enough medicine to last until Gennette PacMary Margaret returns

## 2015-09-04 ENCOUNTER — Encounter: Payer: Medicare Other | Admitting: *Deleted

## 2015-09-04 DIAGNOSIS — Z1231 Encounter for screening mammogram for malignant neoplasm of breast: Secondary | ICD-10-CM | POA: Diagnosis not present

## 2015-09-12 ENCOUNTER — Other Ambulatory Visit: Payer: Self-pay | Admitting: Nurse Practitioner

## 2015-10-02 ENCOUNTER — Other Ambulatory Visit: Payer: Self-pay | Admitting: Nurse Practitioner

## 2015-10-02 ENCOUNTER — Telehealth: Payer: Self-pay | Admitting: Nurse Practitioner

## 2015-10-02 DIAGNOSIS — M549 Dorsalgia, unspecified: Principal | ICD-10-CM

## 2015-10-02 DIAGNOSIS — G8929 Other chronic pain: Secondary | ICD-10-CM

## 2015-10-02 MED ORDER — HYDROCODONE-ACETAMINOPHEN 5-325 MG PO TABS: ORAL_TABLET | ORAL | 0 refills | Status: DC

## 2015-10-02 NOTE — Telephone Encounter (Signed)
Patient needs to be seen in the future for pain medication- needs pain appointment to discuss use and sign contract rx ready for pick up

## 2015-10-02 NOTE — Telephone Encounter (Signed)
Pt aware that will NTBS for more and script is up front

## 2015-10-13 ENCOUNTER — Telehealth: Payer: Self-pay | Admitting: Nurse Practitioner

## 2015-10-13 DIAGNOSIS — I10 Essential (primary) hypertension: Secondary | ICD-10-CM

## 2015-10-13 MED ORDER — LISINOPRIL-HYDROCHLOROTHIAZIDE 20-25 MG PO TABS: 1.0000 | ORAL_TABLET | Freq: Every day | ORAL | 0 refills | Status: DC

## 2015-10-13 NOTE — Telephone Encounter (Signed)
done

## 2015-10-18 ENCOUNTER — Ambulatory Visit (INDEPENDENT_AMBULATORY_CARE_PROVIDER_SITE_OTHER): Payer: Medicare Other | Admitting: Family Medicine

## 2015-10-18 ENCOUNTER — Encounter: Payer: Self-pay | Admitting: Family Medicine

## 2015-10-18 VITALS — BP 135/81 | HR 87 | Temp 96.8°F | Ht 65.0 in | Wt 125.4 lb

## 2015-10-18 DIAGNOSIS — S161XXA Strain of muscle, fascia and tendon at neck level, initial encounter: Secondary | ICD-10-CM

## 2015-10-18 MED ORDER — CYCLOBENZAPRINE HCL 10 MG PO TABS: 10.0000 mg | ORAL_TABLET | Freq: Three times a day (TID) | ORAL | 0 refills | Status: DC | PRN

## 2015-10-18 NOTE — Progress Notes (Signed)
BP 135/81 (BP Location: Right Arm, Patient Position: Sitting, Cuff Size: Normal)   Pulse 87   Temp (!) 96.8 F (36 C) (Oral)   Ht  (1.651 m)   Wt 125 lb 6.4 oz (56.9 kg)   BMI 20.87 kg/m    Subjective:    Patient ID: Samantha Huerta, female    DOB: 03-28-1945, 70 y.o.   MRN: [id number]  HPI: Samantha Huerta is a 70 y.o. female presenting on 10/18/2015 for Neck Pain (started since Monday and it is just getting worse )   HPI Left-sided neck pain Patient has left-sided neck pain is been getting worse over the past couple weeks. She denies any fevers or chills or shortness of breath or ear pain or cough or congestion. She says mostly it is been in her neck on that side. She just feels really tight and has trouble turning to her left side because of the tightness in her neck. She has been using her Norco for this but nothing else yet. The pain is 7 out of 10.  Relevant past medical, surgical, family and social history reviewed and updated as indicated. Interim medical history since our last visit reviewed. Allergies and medications reviewed and updated.  Review of Systems  Constitutional: Negative for chills and fever.  HENT: Negative for congestion, ear discharge, ear pain, rhinorrhea, sinus pressure, sneezing and sore throat.   Eyes: Negative for redness and visual disturbance.  Respiratory: Negative for chest tightness and shortness of breath.   Cardiovascular: Negative for chest pain and leg swelling.  Genitourinary: Negative for difficulty urinating and dysuria.  Musculoskeletal: Positive for myalgias, neck pain and neck stiffness. Negative for back pain and gait problem.  Skin: Negative for rash.  Neurological: Negative for light-headedness and headaches.  Psychiatric/Behavioral: Negative for agitation and behavioral problems.  All other systems reviewed and are negative.   Per HPI unless specifically indicated above     Medication List       Accurate as of 10/18/15   4:47 PM. Always use your most recent med list.          ADVAIR DISKUS 250-50 MCG/DOSE Aepb Generic drug:  Fluticasone-Salmeterol INHALE ONE DOSE BY MOUTH TWICE DAILY   alendronate 35 MG tablet Commonly known as:  FOSAMAX Take 1 tablet (35 mg total) by mouth every 7 (seven) days. Take with a full glass of water on an empty stomach. Do not lie down or recline for 30 minutes after administration.   brinzolamide 1 % ophthalmic suspension Commonly known as:  AZOPT Place 1 drop into both eyes 2 (two) times daily.   cyclobenzaprine 10 MG tablet Commonly known as:  FLEXERIL Take 1 tablet (10 mg total) by mouth 3 (three) times daily as needed for muscle spasms.   fenofibrate 160 MG tablet Take 1 tablet (160 mg total) by mouth daily.   HYDROcodone-acetaminophen 5-325 MG tablet Commonly known as:  NORCO/VICODIN TAKE ONE TABLET BY MOUTH EVERY 6 HOURS AS NEEDED FOR PAIN   latanoprost 0.005 % ophthalmic solution Commonly known as:  XALATAN Place 1 drop into both eyes at bedtime.   lisinopril-hydrochlorothiazide 20-25 MG tablet Commonly known as:  PRINZIDE,ZESTORETIC Take 1 tablet by mouth daily.   omeprazole 20 MG capsule Commonly known as:  PRILOSEC Take 1 capsule (20 mg total) by mouth 2 (two) times daily.   pravastatin 40 MG tablet Commonly known as:  PRAVACHOL Take 1 tablet (40 mg total) by mouth daily.  Objective:    BP 135/81 (BP Location: Right Arm, Patient Position: Sitting, Cuff Size: Normal)   Pulse 87   Temp (!) 96.8 F (36 C) (Oral)   Ht 5\' 5"  (1.651 m)   Wt 125 lb 6.4 oz (56.9 kg)   BMI 20.87 kg/m   Wt Readings from Last 3 Encounters:  10/18/15 125 lb 6.4 oz (56.9 kg)  06/01/15 128 lb (58.1 kg)  03/23/15 129 lb (58.5 kg)    Physical Exam  Constitutional: She is oriented to person, place, and time. She appears well-developed and well-nourished. No distress.  HENT:  Right Ear: External ear normal.  Left Ear: External ear normal.  Nose: Nose  normal.  Mouth/Throat: Oropharynx is clear and moist. No oropharyngeal exudate.  Eyes: Conjunctivae and EOM are normal. Pupils are equal, round, and reactive to light.  Neck: Neck supple. No thyromegaly present.  Cardiovascular: Normal rate, regular rhythm, normal heart sounds and intact distal pulses.   No murmur heard. Pulmonary/Chest: Effort normal and breath sounds normal. No respiratory distress. She has no wheezes.  Musculoskeletal: She exhibits no edema.       Cervical back: She exhibits decreased range of motion (Decreased range of motion from pain) and tenderness. She exhibits no bony tenderness, no swelling, no edema and normal pulse.       Back:  Lymphadenopathy:    She has no cervical adenopathy.  Neurological: She is alert and oriented to person, place, and time. Coordination normal.  Skin: Skin is warm and dry. No rash noted. She is not diaphoretic.  Psychiatric: She has a normal mood and affect. Her behavior is normal.  Nursing note and vitals reviewed.     Assessment & Plan:   Problem List Items Addressed This Visit    None    Visit Diagnoses    Neck muscle strain, initial encounter    -  Primary   Gave muscle relaxer, recommended anti-inflammatory and stretching and heat, if not improved will send to physical therapy   Relevant Medications   cyclobenzaprine (FLEXERIL) 10 MG tablet       Follow up plan: Return if symptoms worsen or fail to improve.  Counseling provided for all of the vaccine components No orders of the defined types were placed in this encounter.   Arville CareJoshua Jerris Keltz, MD [facility] 10/18/2015, 4:47 PM

## 2015-10-19 ENCOUNTER — Telehealth: Payer: Self-pay | Admitting: Family Medicine

## 2015-10-19 NOTE — Telephone Encounter (Signed)
Pt notified of Dr Dettinger recommendation Verbalizes understanding

## 2015-10-19 NOTE — Telephone Encounter (Signed)
Please advise and route to pool A 

## 2015-10-19 NOTE — Telephone Encounter (Signed)
Unfortunately these things do take sometimes up to a week or 2 to improve, I just saw her yesterday so continue these medications and stretching to work out those muscle groups.

## 2015-11-02 ENCOUNTER — Encounter: Payer: Self-pay | Admitting: Nurse Practitioner

## 2015-11-02 ENCOUNTER — Ambulatory Visit (INDEPENDENT_AMBULATORY_CARE_PROVIDER_SITE_OTHER): Payer: Medicare Other | Admitting: Nurse Practitioner

## 2015-11-02 VITALS — BP 132/80 | HR 86 | Temp 97.1°F | Ht 65.0 in | Wt 124.0 lb

## 2015-11-02 DIAGNOSIS — H401131 Primary open-angle glaucoma, bilateral, mild stage: Secondary | ICD-10-CM | POA: Diagnosis not present

## 2015-11-02 DIAGNOSIS — H1851 Endothelial corneal dystrophy: Secondary | ICD-10-CM | POA: Diagnosis not present

## 2015-11-02 DIAGNOSIS — G8929 Other chronic pain: Secondary | ICD-10-CM | POA: Diagnosis not present

## 2015-11-02 DIAGNOSIS — M549 Dorsalgia, unspecified: Secondary | ICD-10-CM

## 2015-11-02 DIAGNOSIS — E785 Hyperlipidemia, unspecified: Secondary | ICD-10-CM

## 2015-11-02 DIAGNOSIS — Z1212 Encounter for screening for malignant neoplasm of rectum: Secondary | ICD-10-CM

## 2015-11-02 DIAGNOSIS — J449 Chronic obstructive pulmonary disease, unspecified: Secondary | ICD-10-CM

## 2015-11-02 DIAGNOSIS — H409 Unspecified glaucoma: Secondary | ICD-10-CM

## 2015-11-02 DIAGNOSIS — K219 Gastro-esophageal reflux disease without esophagitis: Secondary | ICD-10-CM | POA: Diagnosis not present

## 2015-11-02 DIAGNOSIS — I10 Essential (primary) hypertension: Secondary | ICD-10-CM

## 2015-11-02 DIAGNOSIS — IMO0001 Reserved for inherently not codable concepts without codable children: Secondary | ICD-10-CM

## 2015-11-02 DIAGNOSIS — M810 Age-related osteoporosis without current pathological fracture: Secondary | ICD-10-CM | POA: Diagnosis not present

## 2015-11-02 DIAGNOSIS — Z961 Presence of intraocular lens: Secondary | ICD-10-CM | POA: Diagnosis not present

## 2015-11-02 MED ORDER — OMEPRAZOLE 20 MG PO CPDR: 20.0000 mg | DELAYED_RELEASE_CAPSULE | Freq: Two times a day (BID) | ORAL | 5 refills | Status: DC

## 2015-11-02 MED ORDER — ALENDRONATE SODIUM 35 MG PO TABS: 35.0000 mg | ORAL_TABLET | ORAL | 5 refills | Status: DC

## 2015-11-02 MED ORDER — PRAVASTATIN SODIUM 40 MG PO TABS: 40.0000 mg | ORAL_TABLET | Freq: Every day | ORAL | 1 refills | Status: DC

## 2015-11-02 MED ORDER — FENOFIBRATE 160 MG PO TABS: 160.0000 mg | ORAL_TABLET | Freq: Every day | ORAL | 5 refills | Status: DC

## 2015-11-02 MED ORDER — LISINOPRIL-HYDROCHLOROTHIAZIDE 20-25 MG PO TABS: 1.0000 | ORAL_TABLET | Freq: Every day | ORAL | 1 refills | Status: DC

## 2015-11-02 MED ORDER — HYDROCODONE-ACETAMINOPHEN 5-325 MG PO TABS: ORAL_TABLET | ORAL | 0 refills | Status: DC

## 2015-11-02 MED ORDER — FLUTICASONE-SALMETEROL 250-50 MCG/DOSE IN AEPB: 1.0000 | INHALATION_SPRAY | Freq: Two times a day (BID) | RESPIRATORY_TRACT | 5 refills | Status: DC

## 2015-11-02 NOTE — Progress Notes (Signed)
Subjective:    Patient ID: Samantha Huerta, female    DOB: 05/31/1945, 70 y.o.   MRN: [id number]  Patient here today for follow up- no complaints or changes since last visit.  Outpatient Encounter Prescriptions as of 11/02/2015  Medication Sig  . ADVAIR DISKUS 250-50 MCG/DOSE AEPB INHALE ONE DOSE BY MOUTH TWICE DAILY  . alendronate (FOSAMAX) 35 MG tablet Take 1 tablet (35 mg total) by mouth every 7 (seven) days. Take with a full glass of water on an empty stomach. Do not lie down or recline for 30 minutes after administration.  . brinzolamide (AZOPT) 1 % ophthalmic suspension Place 1 drop into both eyes 2 (two) times daily.  . cyclobenzaprine (FLEXERIL) 10 MG tablet Take 1 tablet (10 mg total) by mouth 3 (three) times daily as needed for muscle spasms.  . fenofibrate 160 MG tablet Take 1 tablet (160 mg total) by mouth daily.  Marland Kitchen HYDROcodone-acetaminophen (NORCO/VICODIN) 5-325 MG tablet TAKE ONE TABLET BY MOUTH EVERY 6 HOURS AS NEEDED FOR PAIN  . latanoprost (XALATAN) 0.005 % ophthalmic solution Place 1 drop into both eyes at bedtime.  Marland Kitchen lisinopril-hydrochlorothiazide (PRINZIDE,ZESTORETIC) 20-25 MG tablet Take 1 tablet by mouth daily.  Marland Kitchen omeprazole (PRILOSEC) 20 MG capsule Take 1 capsule (20 mg total) by mouth 2 (two) times daily.  . pravastatin (PRAVACHOL) 40 MG tablet Take 1 tablet (40 mg total) by mouth daily.   No facility-administered encounter medications on file as of 11/02/2015.      Hypertension  This is a chronic problem. The current episode started more than 1 year ago. The problem is unchanged. The problem is controlled. Pertinent negatives include no blurred vision, chest pain, headaches, peripheral edema, shortness of breath or sweats. Risk factors for coronary artery disease include dyslipidemia and post-menopausal state. Past treatments include ACE inhibitors and diuretics. The current treatment provides significant improvement. Compliance problems include diet and exercise.    Hyperlipidemia  This is a chronic problem. The current episode started more than 1 year ago. The problem is controlled. Recent lipid tests were reviewed and are normal. Pertinent negatives include no chest pain or shortness of breath. Current antihyperlipidemic treatment includes fibric acid derivatives and statins. The current treatment provides moderate improvement of lipids. Compliance problems include adherence to diet and adherence to exercise.  Risk factors for coronary artery disease include hypertension and post-menopausal.  Gerd Omeprazole OTC- Worknig well no symptoms when she takes COPD Uses inhalers dialy- under control- Patient still smoking Chronic back pain Thinks it comes from working and standing on cement floor all day long. Takes norco but  only when needed. Pain assessment: Cause of pain- not sure Pain location- mid lower back Pain on scale of 1-10-  2/10 right now can get as high as 9/10 Frequency- What increases pain-usually when she is working What makes pain Better-sitting and resting Effects on ADL - some days no effects  Other days hard  To get out of bed Any change in general medical condition-none  Current medications- h ydrocodone 5/325 some days BID Effectiveness of current meds-helps Adverse reactions form pain meds-none  Pill count performed-No- patient does not take daily Urine drug screen- No Was the [place] reviewed- no   glaucoma Currently on eye drops- sees eye doctor 2x a year- has follow up appointment in April.    Review of Systems  Constitutional: Negative for activity change, appetite change, fatigue and fever.  HENT: Negative for congestion and postnasal drip.   Eyes: Negative for blurred vision.  Respiratory: Negative for shortness of breath.   Cardiovascular: Negative for chest pain.  Gastrointestinal: Negative for blood in stool, constipation and diarrhea.  Genitourinary: Negative for dysuria and frequency.  Skin: Negative for rash.   Neurological: Negative for weakness and headaches.  All other systems reviewed and are negative.      Objective:   Physical Exam  Constitutional: She is oriented to person, place, and time. She appears well-developed and well-nourished.  HENT:  Nose: Nose normal.  Mouth/Throat: Oropharynx is clear and moist.  Eyes: EOM are normal.  Neck: Trachea normal, normal range of motion and full passive range of motion without pain. Neck supple. No JVD present. Carotid bruit is not present. No thyromegaly present.  Cardiovascular: Normal rate, regular rhythm, normal heart sounds and intact distal pulses.  Exam reveals no gallop and no friction rub.   No murmur heard. Pulmonary/Chest: Effort normal and breath sounds normal. No respiratory distress. She has no wheezes. She has no rales.  Abdominal: Soft. Bowel sounds are normal. She exhibits no distension and no mass. There is no tenderness.  Musculoskeletal: Normal range of motion.  Lymphadenopathy:    She has no cervical adenopathy.  Neurological: She is alert and oriented to person, place, and time. She has normal reflexes.  Skin: Skin is warm and dry.  Psychiatric: She has a normal mood and affect. Her behavior is normal. Judgment and thought content normal.   BP 132/80   Pulse 86   Temp 97.1 F (36.2 C) (Oral)   Ht 5' 5" (1.651 m)   Wt 124 lb (56.2 kg)   BMI 20.63 kg/m         Assessment & Plan:   1. Essential hypertension Do not add salt to diet - CMP14+EGFR - lisinopril-hydrochlorothiazide (PRINZIDE,ZESTORETIC) 20-25 MG tablet; Take 1 tablet by mouth daily.  Dispense: 90 tablet; Refill: 1  2. Gastroesophageal reflux disease without esophagitis Avoid spicy foods Do not eat 2 hours prior to bedtime - omeprazole (PRILOSEC) 20 MG capsule; Take 1 capsule (20 mg total) by mouth 2 (two) times daily.  Dispense: 30 capsule; Refill: 5  3. Hyperlipidemia Low fat diet - Lipid panel - pravastatin (PRAVACHOL) 40 MG tablet; Take 1  tablet (40 mg total) by mouth daily.  Dispense: 90 tablet; Refill: 1  4. Chronic back pain Moist heat  rest - HYDROcodone-acetaminophen (NORCO/VICODIN) 5-325 MG tablet; TAKE ONE TABLET BY MOUTH EVERY 6 HOURS AS NEEDED FOR PAIN  Dispense: 30 tablet; Refill: 0  5. COPD bronchitis Stop smoking  6. Glaucoma Keep follow up with eye doctor  7. Screening for malignant neoplasm of the rectum - Fecal occult blood, imunochemical; Future  8. Osteoporosis Weight bearing exercises - alendronate (FOSAMAX) 35 MG tablet; Take 1 tablet (35 mg total) by mouth every 7 (seven) days. Take with a full glass of water on an empty stomach. Do not lie down or recline for 30 minutes after administration.  Dispense: 4 tablet; Refill: 5    Labs pending Health maintenance reviewed Diet and exercise encouraged Continue all meds Follow up  In 6 months   [place], FNP

## 2015-11-02 NOTE — Patient Instructions (Signed)
Bone Health Bones protect organs, store calcium, and anchor muscles. Good health habits, such as eating nutritious foods and exercising regularly, are important for maintaining healthy bones. They can also help to prevent a condition that causes bones to lose density and become weak and brittle (osteoporosis). WHY IS BONE MASS IMPORTANT? Bone mass refers to the amount of bone tissue that you have. The higher your bone mass, the stronger your bones. An important step toward having healthy bones throughout life is to have strong and dense bones during childhood. A young adult who has a high bone mass is more likely to have a high bone mass later in life. Bone mass at its greatest it is called peak bone mass. A large decline in bone mass occurs in older adults. In women, it occurs about the time of menopause. During this time, it is important to practice good health habits, because if more bone is lost than what is replaced, the bones will become less healthy and more likely to break (fracture). If you find that you have a low bone mass, you may be able to prevent osteoporosis or further bone loss by changing your diet and lifestyle. HOW CAN I FIND OUT IF MY BONE MASS IS LOW? Bone mass can be measured with an X-ray test that is called a bone mineral density (BMD) test. This test is recommended for all women who are age 65 or older. It may also be recommended for men who are age 70 or older, or for people who are more likely to develop osteoporosis due to:  Having bones that break easily.  Having a long-term disease that weakens bones, such as kidney disease or rheumatoid arthritis.  Having menopause earlier than normal.  Taking medicine that weakens bones, such as steroids, thyroid hormones, or hormone treatment for breast cancer or prostate cancer.  Smoking.  Drinking three or more alcoholic drinks each day. WHAT ARE THE NUTRITIONAL RECOMMENDATIONS FOR HEALTHY BONES? To have healthy bones, you need  to get enough of the right minerals and vitamins. Most nutrition experts recommend getting these nutrients from the foods that you eat. Nutritional recommendations vary from person to person. Ask your health care provider what is healthy for you. Here are some general guidelines. Calcium Recommendations Calcium is the most important (essential) mineral for bone health. Most people can get enough calcium from their diet, but supplements may be recommended for people who are at risk for osteoporosis. Good sources of calcium include:  Dairy products, such as low-fat or nonfat milk, cheese, and yogurt.  Dark green leafy vegetables, such as bok choy and broccoli.  Calcium-fortified foods, such as orange juice, cereal, bread, soy beverages, and tofu products.  Nuts, such as almonds. Follow these recommended amounts for daily calcium intake:  Children, age 1-3: 700 mg.  Children, age 4-8: 1,000 mg.  Children, age 9-13: 1,300 mg.  Teens, age 14-18: 1,300 mg.  Adults, age 19-50: 1,000 mg.  Adults, age 51-70:  Men: 1,000 mg.  Women: 1,200 mg.  Adults, age 71 or older: 1,200 mg.  Pregnant and breastfeeding females:  Teens: 1,300 mg.  Adults: 1,000 mg. Vitamin D Recommendations Vitamin D is the most essential vitamin for bone health. It helps the body to absorb calcium. Sunlight stimulates the skin to make vitamin D, so be sure to get enough sunlight. If you live in a cold climate or you do not get outside often, your health care provider may recommend that you take vitamin D supplements. Good   sources of vitamin D in your diet include:  Egg yolks.  Saltwater fish.  Milk and cereal fortified with vitamin D. Follow these recommended amounts for daily vitamin D intake:  Children and teens, age 1-18: 600 international units.  Adults, age 50 or younger: 400-800 international units.  Adults, age 51 or older: 800-1,000 international units. Other Nutrients Other nutrients for bone  health include:  Phosphorus. This mineral is found in meat, poultry, dairy foods, nuts, and legumes. The recommended daily intake for adult men and adult women is 700 mg.  Magnesium. This mineral is found in seeds, nuts, dark green vegetables, and legumes. The recommended daily intake for adult men is 400-420 mg. For adult women, it is 310-320 mg.  Vitamin K. This vitamin is found in green leafy vegetables. The recommended daily intake is 120 mg for adult men and 90 mg for adult women. WHAT TYPE OF PHYSICAL ACTIVITY IS BEST FOR BUILDING AND MAINTAINING HEALTHY BONES? Weight-bearing and strength-building activities are important for building and maintaining peak bone mass. Weight-bearing activities cause muscles and bones to work against gravity. Strength-building activities increases muscle strength that supports bones. Weight-bearing and muscle-building activities include:  Walking and hiking.  Jogging and running.  Dancing.  Gym exercises.  Lifting weights.  Tennis and racquetball.  Climbing stairs.  Aerobics. Adults should get at least 30 minutes of moderate physical activity on most days. Children should get at least 60 minutes of moderate physical activity on most days. Ask your health care provide what type of exercise is best for you. WHERE CAN I FIND MORE INFORMATION? For more information, check out the following websites:  National Osteoporosis Foundation: http://nof.org/learn/basics  National Institutes of Health: http://www.niams.nih.gov/Health_Info/Bone/Bone_Health/bone_health_for_life.asp   This information is not intended to replace advice given to you by your health care provider. Make sure you discuss any questions you have with your health care provider.   Document Released: 05/18/2003 Document Revised: 07/12/2014 Document Reviewed: 03/02/2014 Elsevier Interactive Patient Education 2016 Elsevier Inc.  

## 2015-11-03 LAB — CMP14+EGFR
ALBUMIN: 4.5 g/dL (ref 3.5–4.8)
ALT: 23 IU/L (ref 0–32)
AST: 34 IU/L (ref 0–40)
Albumin/Globulin Ratio: 2.1 (ref 1.2–2.2)
Alkaline Phosphatase: 46 IU/L (ref 39–117)
BUN / CREAT RATIO: 14 (ref 12–28)
BUN: 11 mg/dL (ref 8–27)
Bilirubin Total: 0.2 mg/dL (ref 0.0–1.2)
CALCIUM: 9.9 mg/dL (ref 8.7–10.3)
CO2: 25 mmol/L (ref 18–29)
CREATININE: 0.76 mg/dL (ref 0.57–1.00)
Chloride: 94 mmol/L — ABNORMAL LOW (ref 96–106)
GFR, EST AFRICAN AMERICAN: 91 mL/min/{1.73_m2} (ref >59)
GFR, EST NON AFRICAN AMERICAN: 79 mL/min/{1.73_m2} (ref >59)
GLUCOSE: 95 mg/dL (ref 65–99)
Globulin, Total: 2.1 g/dL (ref 1.5–4.5)
Potassium: 4.2 mmol/L (ref 3.5–5.2)
Sodium: 132 mmol/L — ABNORMAL LOW (ref 134–144)
TOTAL PROTEIN: 6.6 g/dL (ref 6.0–8.5)

## 2015-11-03 LAB — LIPID PANEL
Chol/HDL Ratio: 1.6 ratio units (ref 0.0–4.4)
Cholesterol, Total: 143 mg/dL (ref 100–199)
HDL: 89 mg/dL (ref >39)
LDL CALC: 42 mg/dL (ref 0–99)
Triglycerides: 61 mg/dL (ref 0–149)
VLDL CHOLESTEROL CAL: 12 mg/dL (ref 5–40)

## 2015-11-09 ENCOUNTER — Other Ambulatory Visit: Payer: Medicare Other

## 2015-11-09 DIAGNOSIS — Z1212 Encounter for screening for malignant neoplasm of rectum: Secondary | ICD-10-CM | POA: Diagnosis not present

## 2015-11-10 LAB — FECAL OCCULT BLOOD, IMMUNOCHEMICAL: FECAL OCCULT BLD: NEGATIVE

## 2015-12-01 ENCOUNTER — Ambulatory Visit (INDEPENDENT_AMBULATORY_CARE_PROVIDER_SITE_OTHER): Payer: Medicare Other

## 2015-12-01 DIAGNOSIS — Z23 Encounter for immunization: Secondary | ICD-10-CM | POA: Diagnosis not present

## 2015-12-20 ENCOUNTER — Telehealth: Payer: Self-pay | Admitting: Nurse Practitioner

## 2015-12-21 ENCOUNTER — Other Ambulatory Visit: Payer: Self-pay

## 2015-12-21 DIAGNOSIS — M549 Dorsalgia, unspecified: Principal | ICD-10-CM

## 2015-12-21 DIAGNOSIS — G8929 Other chronic pain: Secondary | ICD-10-CM

## 2015-12-21 NOTE — Telephone Encounter (Signed)
[facility]

## 2015-12-21 NOTE — Telephone Encounter (Signed)
Patient says she will call back next week and see if M. Daphine DeutscherMartin will refill pain medication with out requiring an office visit.

## 2015-12-21 NOTE — Telephone Encounter (Signed)
Needs office visit for refill of narcotics.

## 2015-12-25 ENCOUNTER — Other Ambulatory Visit: Payer: Self-pay | Admitting: Nurse Practitioner

## 2015-12-25 DIAGNOSIS — E785 Hyperlipidemia, unspecified: Secondary | ICD-10-CM

## 2016-01-05 ENCOUNTER — Encounter: Payer: Self-pay | Admitting: Pediatrics

## 2016-01-05 ENCOUNTER — Ambulatory Visit (INDEPENDENT_AMBULATORY_CARE_PROVIDER_SITE_OTHER): Payer: Medicare Other | Admitting: Pediatrics

## 2016-01-05 VITALS — BP 147/78 | HR 99 | Temp 97.2°F | Ht 65.0 in | Wt 127.4 lb

## 2016-01-05 DIAGNOSIS — E871 Hypo-osmolality and hyponatremia: Secondary | ICD-10-CM

## 2016-01-05 DIAGNOSIS — E785 Hyperlipidemia, unspecified: Secondary | ICD-10-CM | POA: Diagnosis not present

## 2016-01-05 DIAGNOSIS — Z0289 Encounter for other administrative examinations: Secondary | ICD-10-CM

## 2016-01-05 DIAGNOSIS — G8929 Other chronic pain: Secondary | ICD-10-CM | POA: Diagnosis not present

## 2016-01-05 DIAGNOSIS — M549 Dorsalgia, unspecified: Secondary | ICD-10-CM

## 2016-01-05 DIAGNOSIS — I10 Essential (primary) hypertension: Secondary | ICD-10-CM | POA: Diagnosis not present

## 2016-01-05 MED ORDER — HYDROCODONE-ACETAMINOPHEN 5-325 MG PO TABS: 1.0000 | ORAL_TABLET | Freq: Two times a day (BID) | ORAL | 0 refills | Status: DC | PRN

## 2016-01-05 MED ORDER — HYDROCODONE-ACETAMINOPHEN 5-325 MG PO TABS: ORAL_TABLET | ORAL | 0 refills | Status: DC

## 2016-01-05 MED ORDER — LISINOPRIL-HYDROCHLOROTHIAZIDE 20-25 MG PO TABS: 1.0000 | ORAL_TABLET | Freq: Every day | ORAL | 1 refills | Status: DC

## 2016-01-05 NOTE — Progress Notes (Addendum)
  Subjective:   Patient ID: Samantha Huerta, female    DOB: 1945/11/17, 70 y.o.   MRN: [id number] CC: Medication Refill  HPI: ZAIRE VANBUSKIRK is a 70 y.o. female presenting for Medication Refill  HTN: Usually 120s-130s, sometimes up to 140s No CP, HA, dizziness  HLD: in donut hole, wonders if she can stop fenofibrate or find alternative Taking pravastatin daily  Pain assessment: Cause of pain- arthritis Pain location- back and R wrist. R knee swells some as well Pain on scale of 1-10- 3-4/10 now, gets up to 9-10/10 when is hurting Frequency- daily, some days worse than others What increases pain- doing more activities What makes pain Better- rest, medication Effects on ADL - able to continue working, doesn't have to go home Any change in general medical condition- no new medical problems  Current medications- tylenol, hydrocodone. Taking 3-4 days a week Effectiveness of current meds-yes, helps with ADLs Adverse reactions form pain meds- none Morphine equivalent 5  Urine drug screen- to be done today Was the [place] reviewed- yes  If yes were their any concerning findings? - no   Relevant past medical, surgical, family and social history reviewed. Allergies and medications reviewed and updated. History  Smoking Status  . Current Every Day Smoker  . Packs/day: 1.00  . Types: Cigarettes  Smokeless Tobacco  . Never Used   ROS: Per HPI   Objective:    BP (!) 147/78   Pulse 99   Temp 97.2 F (36.2 C) (Oral)   Ht '5\' 5"'$  (1.651 m)   Wt 127 lb 6.4 oz (57.8 kg)   BMI 21.20 kg/m   Wt Readings from Last 3 Encounters:  01/05/16 127 lb 6.4 oz (57.8 kg)  11/02/15 124 lb (56.2 kg)  10/18/15 125 lb 6.4 oz (56.9 kg)    Gen: NAD, alert, cooperative with exam, NCAT EYES: EOMI, no conjunctival injection, or no icterus ENT:  OP without erythema LYMPH: no cervical LAD CV: NRRR, normal S1/S2, no murmur, distal pulses 2+ b/l Resp: CTABL, no wheezes, normal WOB Ext: No edema,  warm Neuro: Alert and oriented MSK: normal muscle bulk  Assessment & Plan:  Nicol was seen today for medication refill.  Diagnoses and all orders for this visit:  Chronic back pain, unspecified back location, unspecified back pain laterality Pain contract signed Medication helps her perform ADLs, continues to work Takes 3-4 times a week, not daily 2 Rx given, dated 10/27, 11/27 for the next 90 days. Must be seen for refills. Pt in agreement with plan -     ToxASSURE Select 13 (MW), Urine -     HYDROcodone-acetaminophen (NORCO/VICODIN) 5-325 MG tablet; TAKE ONE TABLET BY MOUTH EVERY 12 HOURS AS NEEDED FOR PAIN -     HYDROcodone-acetaminophen (NORCO/VICODIN) 5-325 MG tablet; Take 1 tablet by mouth 2 (two) times daily as needed for moderate pain.  Essential hypertension Adequate control, cont meds -     lisinopril-hydrochlorothiazide (PRINZIDE,ZESTORETIC) 20-25 MG tablet; Take 1 tablet by mouth daily. -     BMP8+EGFR  Pain management contract signed -     ToxASSURE Select 13 (MW), Urine  Hyperlipidemia, unspecified hyperlipidemia type Cont pravastatin  Hyponatremia Recheck BMP If Na low, may need to stop HCTZ  Follow up plan: Return in about 3 months (around 04/06/2016) for pain management f/u. Assunta Found, MD [facility]   ADDENDUM: Hyponatremia to 130 Stop HCTZ Increase lisinopril to '30mg'$  RTC for BP check and labs 3 weeks

## 2016-01-06 LAB — BMP8+EGFR
BUN / CREAT RATIO: 19 (ref 12–28)
BUN: 15 mg/dL (ref 8–27)
CALCIUM: 9.9 mg/dL (ref 8.7–10.3)
CO2: 24 mmol/L (ref 18–29)
CREATININE: 0.78 mg/dL (ref 0.57–1.00)
Chloride: 89 mmol/L — ABNORMAL LOW (ref 96–106)
GFR calc Af Amer: 88 mL/min/{1.73_m2} (ref >59)
GFR calc non Af Amer: 77 mL/min/{1.73_m2} (ref >59)
GLUCOSE: 113 mg/dL — AB (ref 65–99)
Potassium: 4.4 mmol/L (ref 3.5–5.2)
Sodium: 130 mmol/L — ABNORMAL LOW (ref 134–144)

## 2016-01-11 MED ORDER — LISINOPRIL 30 MG PO TABS: 30.0000 mg | ORAL_TABLET | Freq: Every day | ORAL | 1 refills | Status: DC

## 2016-01-11 NOTE — Addendum Note (Signed)
Addended by: Johna SheriffVINCENT, Birdena Kingma L on: 01/11/2016 08:12 AM   Modules accepted: Orders

## 2016-01-13 LAB — TOXASSURE SELECT 13 (MW), URINE

## 2016-02-09 ENCOUNTER — Encounter: Payer: Self-pay | Admitting: Pediatrics

## 2016-02-09 ENCOUNTER — Ambulatory Visit (INDEPENDENT_AMBULATORY_CARE_PROVIDER_SITE_OTHER): Payer: Medicare Other | Admitting: Pediatrics

## 2016-02-09 VITALS — BP 144/83 | HR 92 | Temp 97.0°F | Ht 65.0 in | Wt 124.0 lb

## 2016-02-09 DIAGNOSIS — M25531 Pain in right wrist: Secondary | ICD-10-CM

## 2016-02-09 DIAGNOSIS — I10 Essential (primary) hypertension: Secondary | ICD-10-CM

## 2016-02-09 MED ORDER — LISINOPRIL 40 MG PO TABS: 40.0000 mg | ORAL_TABLET | Freq: Every day | ORAL | 1 refills | Status: DC

## 2016-02-09 NOTE — Patient Instructions (Addendum)
Stop lisinopril 30mg  Start lisinopril 40mg  Continue to keep an eye on BPs at home Labs today

## 2016-02-09 NOTE — Progress Notes (Signed)
  Subjective:   Patient ID: Samantha Huerta, female    DOB: 1946/02/03, 70 y.o.   MRN: [id number] CC: Blood Pressure Check  HPI: Samantha Huerta is a 70 y.o. female presenting for Blood Pressure Check  HTN: 120s-130s at home, often 140s, sometimes to the 150s Never lower than 120s  R wrist hurting more recently Taking ibuprofen Helping some Had carpal tunnel syndrome   Relevant past medical, surgical, family and social history reviewed. Allergies and medications reviewed and updated. History  Smoking Status  . Current Every Day Smoker  . Packs/day: 1.00  . Types: Cigarettes  Smokeless Tobacco  . Never Used   ROS: Per HPI   Objective:    BP (!) 144/83   Pulse 92   Temp 97 F (36.1 C) (Oral)   Ht _0  (1.651 m)   Wt 124 lb (56.2 kg)   BMI 20.63 kg/m   Wt Readings from Last 3 Encounters:  02/09/16 124 lb (56.2 kg)  01/05/16 127 lb 6.4 oz (57.8 kg)  11/02/15 124 lb (56.2 kg)    Gen: NAD, alert, cooperative with exam, NCAT EYES: EOMI, no conjunctival injection, or no icterus CV: NRRR, normal S1/S2, no murmur, distal pulses 2+ b/l Resp: CTABL, no wheezes, normal WOB Ext: No edema, warm Neuro: Alert and oriented MSK: R wrist with bony enlargement ulnar dorsum of wrist, cystic nodule flexor surface of ulnar side, additional cystic structure radial side of wrist. Some pain with ROM R wrist, OA in fingers  Assessment & Plan:  Samantha Huerta was seen today for blood pressure check.  Diagnoses and all orders for this visit:  Essential hypertension Still elevated, increase from 30 to 20m Repeat labs today -     lisinopril (PRINIVIL,ZESTRIL) 40 MG tablet; Take 1 tablet (40 mg total) by mouth daily. -     BMP8+EGFR  Wrist pain Offered referral to orthopedics, possible cysts in wrist that may be contributing to pain, does have history of arthritis. pt declined Cont NSAIDs prn, rest, ice  Follow up plan: Return in about 8 weeks (around 04/03/2016) for pain follow up. CAssunta Found MD Huerta

## 2016-02-10 LAB — BMP8+EGFR
BUN / CREAT RATIO: 17 (ref 12–28)
BUN: 11 mg/dL (ref 8–27)
CALCIUM: 9.5 mg/dL (ref 8.7–10.3)
CHLORIDE: 97 mmol/L (ref 96–106)
CO2: 24 mmol/L (ref 18–29)
Creatinine, Ser: 0.66 mg/dL (ref 0.57–1.00)
GFR calc non Af Amer: 89 mL/min/{1.73_m2} (ref >59)
GFR, EST AFRICAN AMERICAN: 103 mL/min/{1.73_m2} (ref >59)
Glucose: 91 mg/dL (ref 65–99)
POTASSIUM: 4.9 mmol/L (ref 3.5–5.2)
Sodium: 134 mmol/L (ref 134–144)

## 2016-03-14 ENCOUNTER — Other Ambulatory Visit: Payer: Self-pay | Admitting: Pediatrics

## 2016-03-14 DIAGNOSIS — I10 Essential (primary) hypertension: Secondary | ICD-10-CM

## 2016-03-29 ENCOUNTER — Encounter: Payer: Self-pay | Admitting: Pediatrics

## 2016-03-29 DIAGNOSIS — S301XXA Contusion of abdominal wall, initial encounter: Secondary | ICD-10-CM | POA: Diagnosis not present

## 2016-03-29 DIAGNOSIS — W010XXA Fall on same level from slipping, tripping and stumbling without subsequent striking against object, initial encounter: Secondary | ICD-10-CM | POA: Diagnosis not present

## 2016-03-29 DIAGNOSIS — J449 Chronic obstructive pulmonary disease, unspecified: Secondary | ICD-10-CM | POA: Diagnosis not present

## 2016-04-05 ENCOUNTER — Encounter: Payer: Self-pay | Admitting: Pediatrics

## 2016-04-05 ENCOUNTER — Ambulatory Visit (INDEPENDENT_AMBULATORY_CARE_PROVIDER_SITE_OTHER): Payer: Medicare Other | Admitting: Pediatrics

## 2016-04-05 ENCOUNTER — Ambulatory Visit (INDEPENDENT_AMBULATORY_CARE_PROVIDER_SITE_OTHER): Payer: Medicare Other

## 2016-04-05 VITALS — BP 126/63 | HR 81 | Temp 97.1°F | Resp 20 | Ht 65.0 in | Wt 130.4 lb

## 2016-04-05 DIAGNOSIS — J189 Pneumonia, unspecified organism: Secondary | ICD-10-CM | POA: Diagnosis not present

## 2016-04-05 DIAGNOSIS — R059 Cough, unspecified: Secondary | ICD-10-CM

## 2016-04-05 DIAGNOSIS — R05 Cough: Secondary | ICD-10-CM

## 2016-04-05 DIAGNOSIS — T148XXA Other injury of unspecified body region, initial encounter: Secondary | ICD-10-CM | POA: Diagnosis not present

## 2016-04-05 DIAGNOSIS — S70912A Unspecified superficial injury of left hip, initial encounter: Secondary | ICD-10-CM | POA: Diagnosis not present

## 2016-04-05 DIAGNOSIS — J949 Pleural condition, unspecified: Secondary | ICD-10-CM | POA: Diagnosis not present

## 2016-04-05 DIAGNOSIS — J948 Other specified pleural conditions: Secondary | ICD-10-CM

## 2016-04-05 MED ORDER — AZITHROMYCIN 250 MG PO TABS: ORAL_TABLET | ORAL | 0 refills | Status: DC

## 2016-04-05 NOTE — Progress Notes (Signed)
  Subjective:   Patient ID: Samantha Huerta, female    DOB: June 19, 1945, 71 y.o.   MRN: [id number] CC: Fall (1 week ago Seen In ER)  HPI: Samantha Huerta is a 71 y.o. female presenting for Fall (1 week ago Seen In ER)  [place]arey SeatFell at home 1 week ago, [place] Bruise is still bothering her, getting better Seen in ED Had CT scan with large hematoma Has pain on R side of chest wall when she takes a deep breath and when she raises her R arm Has been ongoing for the past few days, isnt sure if worse with fall or not  Has been coughing for over the past week Has had some of nasal congestion Chills off and on at home Appetite has been ok Non-productive cough Coughing bothering her the most No fevers  Relevant past medical, surgical, family and social history reviewed. Allergies and medications reviewed and updated. History  Smoking Status  . Current Every Day Smoker  . Packs/day: 1.00  . Types: Cigarettes  Smokeless Tobacco  . Never Used   ROS: Per HPI   Objective:    BP 126/63   Pulse 81   Temp 97.1 F (36.2 C) (Oral)   Resp 20   Ht 5\' 5"  (1.651 m)   Wt 130 lb 6.4 oz (59.1 kg)   SpO2 99%   BMI 21.70 kg/m   Wt Readings from Last 3 Encounters:  04/05/16 130 lb 6.4 oz (59.1 kg)  02/09/16 124 lb (56.2 kg)  01/05/16 127 lb 6.4 oz (57.8 kg)    Gen: NAD, alert, cooperative with exam, NCAT EYES: EOMI, no conjunctival injection, or no icterus ENT:  TMs pearly gray b/l, OP without erythema LYMPH: no cervical LAD CV: NRRR, normal S1/S2, no murmur, distal pulses 2+ b/l Resp: rhonchi b/l, no wheezes, normal WOB Abd: +BS, soft, NTND. no guarding or organomegaly Ext: No edema, warm Neuro: Alert and oriented Skin: L flank with large purple-yellow hematoma, 10-15 cm of induration L flank, no redness  Assessment & Plan:  Samantha Huerta was seen today for fall.  Diagnoses and all orders for this visit:  Cough -     DG Chest 2 View; Future  Hematoma Very large hematoma L  flank REcheck CBC today -     CBC with Differential/Platelet  Community acquired pneumonia, unspecified laterality Treating based on symptoms -     azithromycin (ZITHROMAX) 250 MG tablet; Take 2 the first day and then one each day after.  Pleural mass Found on chest xray today, R side, near where pt's pain is with deep breaths, moving her arm Discussed results with pt by phone and need for CT scan for further evaluation Long h/o smoking -     CT Chest Wo Contrast; Future   Follow up plan: 4 weeks Rex Krasarol Labrisha Wuellner, MD [facility]

## 2016-04-06 LAB — CBC WITH DIFFERENTIAL/PLATELET
BASOS: 0 %
Basophils Absolute: 0 10*3/uL (ref 0.0–0.2)
EOS (ABSOLUTE): 0.1 10*3/uL (ref 0.0–0.4)
EOS: 1 %
HEMATOCRIT: 28.7 % — AB (ref 34.0–46.6)
Hemoglobin: 9.3 g/dL — ABNORMAL LOW (ref 11.1–15.9)
IMMATURE GRANULOCYTES: 0 %
Immature Grans (Abs): 0 10*3/uL (ref 0.0–0.1)
LYMPHS ABS: 1.1 10*3/uL (ref 0.7–3.1)
Lymphs: 11 %
MCH: 30.6 pg (ref 26.6–33.0)
MCHC: 32.4 g/dL (ref 31.5–35.7)
MCV: 94 fL (ref 79–97)
MONOS ABS: 1.8 10*3/uL — AB (ref 0.1–0.9)
Monocytes: 18 %
NEUTROS ABS: 6.9 10*3/uL (ref 1.4–7.0)
Neutrophils: 70 %
Platelets: 541 10*3/uL — ABNORMAL HIGH (ref 150–379)
RBC: 3.04 x10E6/uL — ABNORMAL LOW (ref 3.77–5.28)
RDW: 13.1 % (ref 12.3–15.4)
WBC: 10 10*3/uL (ref 3.4–10.8)

## 2016-04-08 ENCOUNTER — Telehealth: Payer: Self-pay | Admitting: Pediatrics

## 2016-04-08 NOTE — Telephone Encounter (Signed)
appt made for ct scan

## 2016-04-09 ENCOUNTER — Telehealth: Payer: Self-pay | Admitting: Pediatrics

## 2016-04-09 DIAGNOSIS — M549 Dorsalgia, unspecified: Principal | ICD-10-CM

## 2016-04-09 DIAGNOSIS — G8929 Other chronic pain: Secondary | ICD-10-CM

## 2016-04-10 MED ORDER — HYDROCODONE-ACETAMINOPHEN 5-325 MG PO TABS: 1.0000 | ORAL_TABLET | Freq: Two times a day (BID) | ORAL | 0 refills | Status: DC | PRN

## 2016-04-10 NOTE — Telephone Encounter (Signed)
Pt notified of RX RX to front

## 2016-04-16 ENCOUNTER — Ambulatory Visit (HOSPITAL_COMMUNITY)
Admission: RE | Admit: 2016-04-16 | Discharge: 2016-04-16 | Disposition: A | Discharge status: Home / Self Care | Payer: Medicare Other | Source: Ambulatory Visit | Attending: Pediatrics | Admitting: Pediatrics

## 2016-04-16 DIAGNOSIS — S2242XA Multiple fractures of ribs, left side, initial encounter for closed fracture: Secondary | ICD-10-CM | POA: Insufficient documentation

## 2016-04-16 DIAGNOSIS — J949 Pleural condition, unspecified: Secondary | ICD-10-CM | POA: Insufficient documentation

## 2016-04-16 DIAGNOSIS — X58XXXA Exposure to other specified factors, initial encounter: Secondary | ICD-10-CM | POA: Insufficient documentation

## 2016-04-16 DIAGNOSIS — J948 Other specified pleural conditions: Secondary | ICD-10-CM

## 2016-04-16 DIAGNOSIS — S301XXA Contusion of abdominal wall, initial encounter: Secondary | ICD-10-CM | POA: Insufficient documentation

## 2016-04-16 DIAGNOSIS — R918 Other nonspecific abnormal finding of lung field: Secondary | ICD-10-CM | POA: Diagnosis not present

## 2016-04-19 ENCOUNTER — Other Ambulatory Visit: Payer: Self-pay | Admitting: Nurse Practitioner

## 2016-05-02 DIAGNOSIS — H401132 Primary open-angle glaucoma, bilateral, moderate stage: Secondary | ICD-10-CM | POA: Diagnosis not present

## 2016-05-02 DIAGNOSIS — Z961 Presence of intraocular lens: Secondary | ICD-10-CM | POA: Diagnosis not present

## 2016-05-03 ENCOUNTER — Other Ambulatory Visit: Payer: Self-pay | Admitting: Nurse Practitioner

## 2016-05-03 DIAGNOSIS — M810 Age-related osteoporosis without current pathological fracture: Secondary | ICD-10-CM

## 2016-05-13 ENCOUNTER — Other Ambulatory Visit: Payer: Self-pay | Admitting: Nurse Practitioner

## 2016-05-13 DIAGNOSIS — E785 Hyperlipidemia, unspecified: Secondary | ICD-10-CM

## 2016-05-23 ENCOUNTER — Other Ambulatory Visit: Payer: Self-pay | Admitting: Pediatrics

## 2016-05-23 DIAGNOSIS — I10 Essential (primary) hypertension: Secondary | ICD-10-CM

## 2016-05-31 ENCOUNTER — Ambulatory Visit (INDEPENDENT_AMBULATORY_CARE_PROVIDER_SITE_OTHER): Payer: Medicare Other | Admitting: Pediatrics

## 2016-05-31 ENCOUNTER — Encounter: Payer: Self-pay | Admitting: Pediatrics

## 2016-05-31 ENCOUNTER — Ambulatory Visit: Payer: Medicare Other | Admitting: Nurse Practitioner

## 2016-05-31 VITALS — BP 163/98 | HR 99 | Temp 97.9°F | Ht 65.0 in | Wt 128.8 lb

## 2016-05-31 DIAGNOSIS — M549 Dorsalgia, unspecified: Secondary | ICD-10-CM

## 2016-05-31 DIAGNOSIS — G8929 Other chronic pain: Secondary | ICD-10-CM | POA: Diagnosis not present

## 2016-05-31 DIAGNOSIS — M25531 Pain in right wrist: Secondary | ICD-10-CM | POA: Diagnosis not present

## 2016-05-31 MED ORDER — HYDROCODONE-ACETAMINOPHEN 5-325 MG PO TABS: ORAL_TABLET | ORAL | 0 refills | Status: DC

## 2016-05-31 MED ORDER — HYDROCODONE-ACETAMINOPHEN 5-325 MG PO TABS: 1.0000 | ORAL_TABLET | Freq: Two times a day (BID) | ORAL | 0 refills | Status: DC | PRN

## 2016-05-31 NOTE — Progress Notes (Signed)
  Subjective:   Patient ID: Eliezer ChampagneCarolyn F Kintzel, female    DOB: [birth year], 71 y.o.   MRN: [id number] CC: Medication Refill and Wrist Pain (Left, 2 weeks)  HPI: Eliezer ChampagneCarolyn F Pfluger is a 71 y.o. female presenting for Medication Refill and Wrist Pain (Left, 2 weeks)  Having increased wrist pain with ext for past two weeks Worse after using it a lot Didn't injure it Has a soft lump on outside of wrist  Pain assessment: Cause of pain- low back, knee arthritis Pain on scale of 1-10- on best days 5/10, worst days 8-9/10 Frequency- not every day, worse after working hard, picking up heavy things at work What increases pain-heavy activity What makes pain Better- braces on wrist and knee Effects on ADL - helps her keep working, keeps her going Any change in general medical condition- none  Current medications- hydrocodone 5mg , sometimes ibuprofen and tylenol Effectiveness of current meds- helping take the edge off of pain Adverse reactions form pain meds- none  Last pill was two days ago Was the NCCSR reviewed- yes  If yes were their any concerning findings? - none   Relevant past medical, surgical, family and social history reviewed. Allergies and medications reviewed and updated. History  Smoking Status  . Current Every Day Smoker  . Packs/day: 1.00  . Types: Cigarettes  Smokeless Tobacco  . Never Used   ROS: Per HPI   Objective:    BP (!) 163/98   Pulse 99   Temp 97.9 F (36.6 C) (Oral)   Ht 5\' 5"  (1.651 m)   Wt 128 lb 12.8 oz (58.4 kg)   BMI 21.43 kg/m   Wt Readings from Last 3 Encounters:  05/31/16 128 lb 12.8 oz (58.4 kg)  04/05/16 130 lb 6.4 oz (59.1 kg)  02/09/16 124 lb (56.2 kg)    Gen: NAD, alert, cooperative with exam, NCAT EYES: EOMI, no conjunctival injection, or no icterus ENT:  OP without erythema LYMPH: no cervical LAD CV: NRRR, normal S1/S2, no murmur, distal pulses 2+ b/l Resp: CTABL, no wheezes, normal WOB Abd: +BS, soft, NTND. no guarding or  organomegaly Ext: No edema, warm Neuro: Alert and oriented MSK: R distal radial side of forearm with 3 cm of anterior discrete soft tissue swelling  Assessment & Plan:  [name] was seen today for medication refill and wrist pain.  Diagnoses and all orders for this visit:  Right wrist pain Discrete swelling on wrist, tender, no known injury No swelling in hand Smoker, no recent surgeries Will get ultrasound for further eval -     VAS US UPPER EXTREMITY VENOUS DUPLEX; Future  Chronic back pain, unspecified back location, unspecified back pain laterality Hydrocodone improves qol, keeps her working 2 Rx of below #30 tabs given to last next 3 mo Not taking every tday Must be seen for refills UDS today -     HYDROcodone-acetaminophen (NORCO/VICODIN) 5-325 MG tablet; TAKE ONE TABLET BY MOUTH EVERY 12 HOURS AS NEEDED FOR PAIN -     HYDROcodone-acetaminophen (NORCO/VICODIN) 5-325 MG tablet; Take 1 tablet by mouth 2 (two) times daily as needed for moderate pain. -     ToxASSURE Select 13 (MW), Urine   Follow up plan: 3 mo for pain med refill, sooner if wrist pain does not improve Rex Krasarol Tayllor Breitenstein, MD [facility]

## 2016-06-06 ENCOUNTER — Other Ambulatory Visit: Payer: Self-pay | Admitting: Nurse Practitioner

## 2016-06-06 ENCOUNTER — Other Ambulatory Visit: Payer: Self-pay | Admitting: Pediatrics

## 2016-06-06 DIAGNOSIS — K219 Gastro-esophageal reflux disease without esophagitis: Secondary | ICD-10-CM

## 2016-06-06 DIAGNOSIS — M810 Age-related osteoporosis without current pathological fracture: Secondary | ICD-10-CM

## 2016-06-14 ENCOUNTER — Other Ambulatory Visit: Payer: Self-pay | Admitting: Nurse Practitioner

## 2016-06-14 DIAGNOSIS — E785 Hyperlipidemia, unspecified: Secondary | ICD-10-CM

## 2016-06-17 NOTE — Telephone Encounter (Signed)
Last refill without being seen 

## 2016-06-18 NOTE — Telephone Encounter (Signed)
Pt aware NTBS 

## 2016-09-12 ENCOUNTER — Encounter: Payer: Self-pay | Admitting: Pediatrics

## 2016-09-12 ENCOUNTER — Ambulatory Visit (INDEPENDENT_AMBULATORY_CARE_PROVIDER_SITE_OTHER): Payer: Medicare Other | Admitting: Pediatrics

## 2016-09-12 VITALS — BP 150/87 | HR 93 | Temp 97.3°F | Ht 65.0 in | Wt 132.6 lb

## 2016-09-12 DIAGNOSIS — G8929 Other chronic pain: Secondary | ICD-10-CM

## 2016-09-12 DIAGNOSIS — E785 Hyperlipidemia, unspecified: Secondary | ICD-10-CM | POA: Diagnosis not present

## 2016-09-12 DIAGNOSIS — I10 Essential (primary) hypertension: Secondary | ICD-10-CM

## 2016-09-12 DIAGNOSIS — N949 Unspecified condition associated with female genital organs and menstrual cycle: Secondary | ICD-10-CM

## 2016-09-12 DIAGNOSIS — M549 Dorsalgia, unspecified: Secondary | ICD-10-CM

## 2016-09-12 LAB — URINALYSIS
Bilirubin, UA: NEGATIVE
Glucose, UA: NEGATIVE
Ketones, UA: NEGATIVE
Leukocytes, UA: NEGATIVE
Nitrite, UA: NEGATIVE
PH UA: 7 (ref 5.0–7.5)
PROTEIN UA: NEGATIVE
RBC, UA: NEGATIVE
Specific Gravity, UA: 1.015 (ref 1.005–1.030)
Urobilinogen, Ur: 0.2 mg/dL (ref 0.2–1.0)

## 2016-09-12 MED ORDER — HYDROCODONE-ACETAMINOPHEN 5-325 MG PO TABS: ORAL_TABLET | ORAL | 0 refills | Status: DC

## 2016-09-12 MED ORDER — PRAVASTATIN SODIUM 40 MG PO TABS: ORAL_TABLET | ORAL | 1 refills | Status: DC

## 2016-09-12 MED ORDER — AMLODIPINE BESYLATE 5 MG PO TABS: 5.0000 mg | ORAL_TABLET | Freq: Every day | ORAL | 3 refills | Status: DC

## 2016-09-12 MED ORDER — LISINOPRIL 40 MG PO TABS: ORAL_TABLET | ORAL | 1 refills | Status: DC

## 2016-09-12 MED ORDER — HYDROCODONE-ACETAMINOPHEN 5-325 MG PO TABS: 1.0000 | ORAL_TABLET | Freq: Two times a day (BID) | ORAL | 0 refills | Status: DC | PRN

## 2016-09-12 NOTE — Progress Notes (Signed)
  Subjective:   Patient ID: Samantha Huerta, female    DOB: 1945-08-14, 71 y.o.   MRN: [id number] CC: Follow-up (3 month) and Vaginal Burning  HPI: Samantha Huerta is a 71 y.o. female presenting for Follow-up (3 month) and Vaginal Burning  Pain:  Continues to have pain in her back related to arthritis Takes hydrocodone as needed, usually about 3 times a week Helps keep her active and working Standing for long periods makes pain worse NCCSRS reviewed and is appropriate  HTN: checks at home, BPs often in 140s No CP, no SOB, no HA  Tobacco use: about 8 cig a day  Burning in perineal area worse at night Keeps pessary in during the day, has had for several years Takes out at night No burning with urination usually No lesions No discharge, no bleeding Pessary does help with incontinence  HLD: stable, takes med regularly  Relevant past medical, surgical, family and social history reviewed. Allergies and medications reviewed and updated. History  Smoking Status  . Current Every Day Smoker  . Packs/day: 1.00  . Types: Cigarettes  Smokeless Tobacco  . Never Used   ROS: Per HPI   Objective:    BP (!) 150/87   Pulse 93   Temp (!) 97.3 F (36.3 C) (Oral)   Ht 5\' 5"  (1.651 m)   Wt 132 lb 9.6 oz (60.1 kg)   BMI 22.07 kg/m   Wt Readings from Last 3 Encounters:  09/12/16 132 lb 9.6 oz (60.1 kg)  05/31/16 128 lb 12.8 oz (58.4 kg)  04/05/16 130 lb 6.4 oz (59.1 kg)    Gen: NAD, alert, cooperative with exam, NCAT EYES: EOMI, no conjunctival injection, or no icterus ENT:  TMs pearly gray b/l, OP without erythema LYMPH: no cervical LAD CV: NRRR, normal S1/S2, no murmur, distal pulses 2+ b/l Resp: CTABL, no wheezes, normal WOB Abd: +BS, soft, NTND.  Ext: No edema, warm Neuro: Alert and oriented  Assessment & Plan:  Samantha JonesCarolyn was seen today for follow-up and vaginal burning.  Diagnoses and all orders for this visit:  Vaginal burning Wet prep will be sent out Pt to f/u  with gyn to reassess pessary -     Urinalysis  Chronic back pain, unspecified back location, unspecified back pain laterality Not taking every day, but does help keep her active and working, stands for long periods of time at work Improves quality of life Two Rx given for #30 tabs, must be seen for refills Utox UTD NCCSRS reviewed today -     HYDROcodone-acetaminophen (NORCO/VICODIN) 5-325 MG tablet; TAKE ONE TABLET BY MOUTH EVERY 12 HOURS AS NEEDED FOR PAIN -     HYDROcodone-acetaminophen (NORCO/VICODIN) 5-325 MG tablet; Take 1 tablet by mouth 2 (two) times daily as needed for moderate pain.  Essential hypertension Elevated, cont lisinopril, start amlodipine -     lisinopril (PRINIVIL,ZESTRIL) 40 MG tablet; TAKE ONE (1) TABLET EACH DAY -     amLODipine (NORVASC) 5 MG tablet; Take 1 tablet (5 mg total) by mouth daily.  Hyperlipidemia, unspecified hyperlipidemia type Stable, cont pravastatin -     pravastatin (PRAVACHOL) 40 MG tablet; TAKE ONE (1) TABLET EACH DAY   Follow up plan: 3 mo, sooner if needed Rex Krasarol Vincent, MD [facility]

## 2016-09-12 NOTE — Patient Instructions (Addendum)
replens or vagisil for moisture

## 2016-09-13 LAB — WET PREP FOR TRICH, YEAST, CLUE
Clue Cell Exam: NEGATIVE
TRICHOMONAS EXAM: NEGATIVE
Yeast Exam: NEGATIVE

## 2016-09-19 ENCOUNTER — Ambulatory Visit (INDEPENDENT_AMBULATORY_CARE_PROVIDER_SITE_OTHER): Payer: Medicare Other

## 2016-09-19 ENCOUNTER — Other Ambulatory Visit: Payer: Self-pay | Admitting: Pediatrics

## 2016-09-19 ENCOUNTER — Ambulatory Visit (INDEPENDENT_AMBULATORY_CARE_PROVIDER_SITE_OTHER): Payer: Medicare Other | Admitting: *Deleted

## 2016-09-19 VITALS — BP 140/75 | HR 87 | Temp 97.2°F | Ht 65.5 in | Wt 132.6 lb

## 2016-09-19 DIAGNOSIS — Z129 Encounter for screening for malignant neoplasm, site unspecified: Secondary | ICD-10-CM

## 2016-09-19 DIAGNOSIS — Z23 Encounter for immunization: Secondary | ICD-10-CM

## 2016-09-19 DIAGNOSIS — M8580 Other specified disorders of bone density and structure, unspecified site: Secondary | ICD-10-CM

## 2016-09-19 DIAGNOSIS — M8588 Other specified disorders of bone density and structure, other site: Secondary | ICD-10-CM

## 2016-09-19 DIAGNOSIS — Z0000 Encounter for general adult medical examination without abnormal findings: Secondary | ICD-10-CM

## 2016-09-19 MED ORDER — ZOSTER VAC RECOMB ADJUVANTED 50 MCG/0.5ML IM SUSR: 0.5000 mL | Freq: Once | INTRAMUSCULAR | 0 refills | Status: AC

## 2016-09-19 NOTE — Progress Notes (Signed)
Patient does not get regular exercise at home. 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

## 2016-09-19 NOTE — Patient Instructions (Addendum)
  Samantha Huerta , Thank you for taking time to come for your Medicare Wellness Visit. I appreciate your ongoing commitment to your health goals. Please review the following plan we discussed and let me know if I can assist you in the future.   These are the goals we discussed:   This is a list of the screening recommended for you and due dates:  Health Maintenance  Topic Date Due  . Tetanus Vaccine  03/11/2012  . Pneumonia vaccines (2 of 2 - PPSV23) 03/22/2016  . Mammogram  09/03/2016  . Flu Shot  10/09/2016  . Stool Blood Test  11/08/2016  . Pap Smear  05/31/2017  . DEXA scan (bone density measurement)  Completed  .  Hepatitis C: One time screening is recommended by Center for Disease Control  (CDC) for  adults born from [id number] through 1965.   Completed

## 2016-09-19 NOTE — Progress Notes (Signed)
Subjective:   Samantha Huerta is a 71 y.o. female who presents for an Initial Medicare Annual Wellness Visit. She is currently working at the [employer] in [place] and has worked for [employer] for 16 years. In the past she worked at [employer] and [employer]. She lives alone and has 2 children and 4 granddaughters.She enjoys cooking and watching TV. She has 2 inside cats. She reports that her health has been the same over the last year. She does report she fell this past January on a rug and had a large hematoma on her left side. She did not have any fractures. She reports that she woke up and went to the bathroom and was not fully awake when she fell.       Objective:    Today's Vitals   09/19/16 1114  BP: (!) 148/72  Pulse: 92  Temp: (!) 97.2 F (36.2 C)  TempSrc: Oral  Weight: 132 lb 9.6 oz (60.1 kg)  Height: 5' 5.5" (1.664 m)  PainSc: 0-No pain   Body mass index is 21.73 kg/m.   Current Medications (verified) Outpatient Encounter Prescriptions as of 09/19/2016  Medication Sig  . ADVAIR DISKUS 250-50 MCG/DOSE AEPB USE 1 INHALATION TWICE DAILY  . alendronate (FOSAMAX) 35 MG tablet TAKE 1 TABLET WEEKLY (TAKE WITH 8OZ OF WATER 30 MINUTES BEFORE BREAKFAST)  . amLODipine (NORVASC) 5 MG tablet Take 1 tablet (5 mg total) by mouth daily.  . brinzolamide (AZOPT) 1 % ophthalmic suspension Place 1 drop into both eyes 2 (two) times daily.  Marland Kitchen HYDROcodone-acetaminophen (NORCO/VICODIN) 5-325 MG tablet TAKE ONE TABLET BY MOUTH EVERY 12 HOURS AS NEEDED FOR PAIN  . HYDROcodone-acetaminophen (NORCO/VICODIN) 5-325 MG tablet Take 1 tablet by mouth 2 (two) times daily as needed for moderate pain.  Marland Kitchen latanoprost (XALATAN) 0.005 % ophthalmic solution Place 1 drop into both eyes at bedtime.  Marland Kitchen lisinopril (PRINIVIL,ZESTRIL) 40 MG tablet TAKE ONE (1) TABLET EACH DAY  . omeprazole (PRILOSEC) 20 MG capsule TAKE ONE CAPSULE BY MOUTH TWICE A DAY  . pravastatin (PRAVACHOL) 40 MG tablet TAKE  ONE (1) TABLET EACH DAY  . Zoster Vac Recomb Adjuvanted [facility]) injection Inject 0.5 mLs into the muscle once.   No facility-administered encounter medications on file as of 09/19/2016.     Allergies (verified) Hydrochlorothiazide   History: Past Medical History:  Diagnosis Date  . Anxiety   . Asthma   . GERD (gastroesophageal reflux disease)   . Glaucoma   . Hyperlipidemia   . Hypertension   . Osteopenia    Past Surgical History:  Procedure Laterality Date  . ABDOMINAL HYSTERECTOMY    . ELBOW SURGERY Right    Family History  Problem Relation Age of Onset  . Hip fracture Mother   . Cancer Sister        breast   Social History   Occupational History  . Not on file.   Social History Main Topics  . Smoking status: Current Every Day Smoker    Packs/day: 0.25    Types: Cigarettes  . Smokeless tobacco: Never Used  . Alcohol use Yes     Comment: drinks vodka 3-4 times a week   . Drug use: No  . Sexual activity: No    Tobacco Counseling Ready to quit: No Counseling given: No   Activities of Daily Living In your present state of health, do you have any difficulty performing the following activities: 09/19/2016  Hearing? N  Vision? N  Difficulty concentrating or making decisions? N  Walking or climbing stairs? N  Dressing or bathing? N  Doing errands, shopping? N  Some recent data might be hidden    Immunizations and Health Maintenance Immunization History  Administered Date(s) Administered  . Influenza, High Dose Seasonal PF 12/01/2015  . Influenza,inj,Quad PF,36+ Mos 01/05/2013, 12/09/2013, 12/09/2014  . Pneumococcal Conjugate-13 03/23/2015   Health Maintenance Due  Topic Date Due  . TETANUS/TDAP  03/11/2012  . PNA vac Low Risk Adult (2 of 2 - PPSV23) 03/22/2016  . MAMMOGRAM  09/03/2016    Patient Care Team: Johna Sheriff, MD as PCP - General (Pediatrics)  Indicate any recent Medical Services you may have received from other than [facility]  providers in the past year (date may be approximate).     Assessment:   This is a routine wellness examination for Samantha Huerta.   Hearing/Vision screen Dr. Carlynn Purl OD  in [facility] manages her glaucoma and she will call for an appointment next month. Hearing is normal and she reports no issues with vision at this time  Dietary issues and exercise activities discussed:  Eats cereal for breakfast and a sandwich for lunch and either yogurt or icecream at night. Encourage 3 healthy meals per day. Set a goal to walk 3 times a week for 30 minutes at a time.   Goals    None     Depression Screen PHQ 2/9 Scores 09/19/2016 09/12/2016 05/31/2016 04/05/2016 02/09/2016 01/05/2016 11/02/2015  PHQ - 2 Score 0 0 0 0 0 0 0    Fall Risk Fall Risk  09/19/2016 09/12/2016 05/31/2016 04/05/2016 02/09/2016  Falls in the past year? Yes No Yes Yes No  Number falls in past yr: 1 - 1 1 -  Injury with Fall? Yes - Yes Yes -  Risk for fall due to : - - History of fall(s) History of fall(s) -  Follow up Education provided;Falls prevention discussed - Falls prevention discussed Falls prevention discussed -    Cognitive Function: MMSE - Mini Mental State Exam 09/19/2016  Orientation to time 5  Orientation to Place 5  Registration 3  Attention/ Calculation 5  Recall 3  Language- name 2 objects 2  Language- repeat 1  Language- follow 3 step command 3  Language- read & follow direction 1  Write a sentence 1  Copy design 1  Total score 30        Screening Tests Health Maintenance  Topic Date Due  . TETANUS/TDAP  03/11/2012  . PNA vac Low Risk Adult (2 of 2 - PPSV23) 03/22/2016  . MAMMOGRAM  09/03/2016  . INFLUENZA VACCINE  10/09/2016  . COLON CANCER SCREENING ANNUAL FOBT  11/08/2016  . PAP SMEAR  05/31/2017  . DEXA SCAN  Completed  . Hepatitis C Screening  Completed      Plan:   Shringrix given and patient will check on her Tdap when she comes for return appt in October with Dr. Cora Collum. Dexascan completed  today. FOBT given and will return in October when she comes for her follow up with [facility]. Mammogram is schedule for August 10th on the mobile unit at our office.   I have personally reviewed and noted the following in the patient's chart:   . Medical and social history . Use of alcohol, tobacco or illicit drugs  . Current medications and supplements . Functional ability and status . Nutritional status . Physical activity . Advanced directives . List of other physicians . Hospitalizations, surgeries, and  ER visits in previous 12 months . Vitals . Screenings to include cognitive, depression, and falls . Referrals and appointments  In addition, I have reviewed and discussed with patient certain preventive protocols, quality metrics, and best practice recommendations. A written personalized care plan for preventive services as well as general preventive health recommendations were provided to patient.     Henrietta DineJill C Swara Donze, RN   09/19/2016    I have reviewed and agree with the above AWV documentation.   Rex Krasarol Vincent, MD [facility] 09/20/2016, 5:41 PM

## 2016-09-27 ENCOUNTER — Encounter: Payer: Self-pay | Admitting: Obstetrics & Gynecology

## 2016-09-27 ENCOUNTER — Ambulatory Visit (INDEPENDENT_AMBULATORY_CARE_PROVIDER_SITE_OTHER): Payer: Medicare Other | Admitting: Obstetrics & Gynecology

## 2016-09-27 VITALS — BP 144/78 | HR 88 | Ht 65.5 in | Wt 133.0 lb

## 2016-09-27 DIAGNOSIS — R350 Frequency of micturition: Secondary | ICD-10-CM | POA: Diagnosis not present

## 2016-09-27 LAB — POCT URINALYSIS DIPSTICK
Blood, UA: NEGATIVE
GLUCOSE UA: NEGATIVE
Ketones, UA: NEGATIVE
LEUKOCYTES UA: NEGATIVE
NITRITE UA: NEGATIVE
PROTEIN UA: NEGATIVE

## 2016-09-27 MED ORDER — OXYBUTYNIN CHLORIDE 5 MG PO TABS: 5.0000 mg | ORAL_TABLET | Freq: Every day | ORAL | 11 refills | Status: DC

## 2016-09-27 NOTE — Progress Notes (Signed)
Chief Complaint  Patient presents with  . urinary frequency at night    bladder has a burning sensation        Blood pressure (!) 144/78, pulse 88, height 5' 5.5" (1.664 m), weight 133 lb (60.3 kg).  71 y.o. [id number] No LMP recorded. Patient has had a hysterectomy.   Outpatient Encounter Prescriptions as of 09/27/2016  Medication Sig  . ADVAIR DISKUS 250-50 MCG/DOSE AEPB USE 1 INHALATION TWICE DAILY  . alendronate (FOSAMAX) 35 MG tablet TAKE 1 TABLET WEEKLY (TAKE WITH 8OZ OF WATER 30 MINUTES BEFORE BREAKFAST)  . amLODipine (NORVASC) 5 MG tablet Take 1 tablet (5 mg total) by mouth daily.  . brinzolamide (AZOPT) 1 % ophthalmic suspension Place 1 drop into both eyes 2 (two) times daily.  . Cholecalciferol (VITAMIN D PO) Take by mouth as needed.  . Cyanocobalamin (VITAMIN B-12 PO) Take by mouth as needed.  Marland Kitchen HYDROcodone-acetaminophen (NORCO/VICODIN) 5-325 MG tablet Take 1 tablet by mouth 2 (two) times daily as needed for moderate pain.  Marland Kitchen latanoprost (XALATAN) 0.005 % ophthalmic solution Place 1 drop into both eyes at bedtime.  Marland Kitchen lisinopril (PRINIVIL,ZESTRIL) 40 MG tablet TAKE ONE (1) TABLET EACH DAY  . omeprazole (PRILOSEC) 20 MG capsule TAKE ONE CAPSULE BY MOUTH TWICE A DAY (Patient taking differently: TAKE ONE CAPSULE BY MOUTH PRN)  . pravastatin (PRAVACHOL) 40 MG tablet TAKE ONE (1) TABLET EACH DAY  . [DISCONTINUED] HYDROcodone-acetaminophen (NORCO/VICODIN) 5-325 MG tablet TAKE ONE TABLET BY MOUTH EVERY 12 HOURS AS NEEDED FOR PAIN  . oxybutynin (DITROPAN) 5 MG tablet Take 1 tablet (5 mg total) by mouth at bedtime.   No facility-administered encounter medications on file as of 09/27/2016.     Subjective Patient reports several months of urinary frequency only occurring at nighttime. She wakes up from sleep every 1-2 hours needing to use the bathroom. Prior to urination, she has burning in the vaginal area that is relieved with urination. The burning started more recently. She  does not have any daytime symptoms. She wears a pessary during the day. She has attempted wearing it at nighttime as well without relief/improvement in symptoms. No diuretic medications or history of diabetes. No abdominal pain or vaginal discharge. Has not been sexually active for several years and has no concern for STD. No incontinence.   Objective Gen: Pleasant, WDWN female  GYN: Warty changes on the right side of the urethra. Grade 2 cystocele at rest. Grade 3 cystocele with straining. Vaginal apex and rectum well supported.   Pertinent ROS Per HPI  Labs or studies UA negative     Impression Diagnoses this Encounter::   ICD-10-CM   1. Urinary frequency R35.0 POCT Urinalysis Dipstick     Plan/Recommendations: Meds ordered this encounter  Medications  . Cholecalciferol (VITAMIN D PO)    Sig: Take by mouth as needed.  . Cyanocobalamin (VITAMIN B-12 PO)    Sig: Take by mouth as needed.  Marland Kitchen oxybutynin (DITROPAN) 5 MG tablet    Sig: Take 1 tablet (5 mg total) by mouth at bedtime.    Dispense:  30 tablet    Refill:  11    Labs or Scans Ordered: Orders Placed This Encounter  Procedures  . POCT Urinalysis Dipstick    Management:: Ditropan IR nightly prior to bedtime for improvement in nighttime urinary frequency. Counseled for potential medication AE of confusion. Ultimately, will plan to change type of pessary.   Follow up No Follow-up on file.  Face to face time:  25 minutes  Greater than 50% of the visit time was spent in counseling and coordination of care with the patient.  The summary and outline of the counseling and care coordination is summarized in the note above.   All questions were answered.  Past Medical History:  Diagnosis Date  . Anxiety   . Asthma   . GERD (gastroesophageal reflux disease)   . Glaucoma   . Hyperlipidemia   . Hypertension   . Osteopenia     Past Surgical History:  Procedure Laterality Date  . ABDOMINAL HYSTERECTOMY    .  ELBOW SURGERY Right   . SHOULDER SURGERY Left     OB History    Gravida Para Term Preterm AB Living   2 2 2     2    SAB TAB Ectopic Multiple Live Births           2      No Known Allergies  Social History   Social History  . Marital status: Divorced    Spouse name: N/A  . Number of children: N/A  . Years of education: N/A   Social History Main Topics  . Smoking status: Current Every Day Smoker    Packs/day: 0.00    Years: 52.00    Types: Cigarettes  . Smokeless tobacco: Never Used     Comment: smokes 8 cig daily  . Alcohol use Yes     Comment: 2-3 times a week  . Drug use: No  . Sexual activity: No     Comment: hyst   Other Topics Concern  . None   Social History Narrative  . None    Family History  Problem Relation Age of Onset  . Hip fracture Mother   . Cancer Sister        breast  . Glaucoma Sister   . Diabetes Sister   . Glaucoma Sister   . Glaucoma Sister   . Glaucoma Sister   . Other Brother        killed in MVA

## 2016-10-18 DIAGNOSIS — Z1231 Encounter for screening mammogram for malignant neoplasm of breast: Secondary | ICD-10-CM | POA: Diagnosis not present

## 2016-11-01 DIAGNOSIS — H52223 Regular astigmatism, bilateral: Secondary | ICD-10-CM | POA: Diagnosis not present

## 2016-11-01 DIAGNOSIS — H5202 Hypermetropia, left eye: Secondary | ICD-10-CM | POA: Diagnosis not present

## 2016-11-01 DIAGNOSIS — H524 Presbyopia: Secondary | ICD-10-CM | POA: Diagnosis not present

## 2016-11-08 ENCOUNTER — Ambulatory Visit: Payer: Medicare Other | Admitting: Obstetrics & Gynecology

## 2016-11-13 ENCOUNTER — Other Ambulatory Visit: Payer: Self-pay | Admitting: Nurse Practitioner

## 2016-12-20 ENCOUNTER — Other Ambulatory Visit: Payer: Self-pay | Admitting: Pediatrics

## 2016-12-20 DIAGNOSIS — M810 Age-related osteoporosis without current pathological fracture: Secondary | ICD-10-CM

## 2016-12-23 ENCOUNTER — Ambulatory Visit (INDEPENDENT_AMBULATORY_CARE_PROVIDER_SITE_OTHER): Payer: Medicare Other | Admitting: Pediatrics

## 2016-12-23 ENCOUNTER — Encounter (INDEPENDENT_AMBULATORY_CARE_PROVIDER_SITE_OTHER): Payer: Self-pay

## 2016-12-23 ENCOUNTER — Encounter: Payer: Self-pay | Admitting: Pediatrics

## 2016-12-23 VITALS — BP 119/68 | HR 81 | Temp 97.3°F | Ht 65.5 in | Wt 133.6 lb

## 2016-12-23 DIAGNOSIS — R350 Frequency of micturition: Secondary | ICD-10-CM

## 2016-12-23 DIAGNOSIS — Z0289 Encounter for other administrative examinations: Secondary | ICD-10-CM

## 2016-12-23 DIAGNOSIS — M810 Age-related osteoporosis without current pathological fracture: Secondary | ICD-10-CM

## 2016-12-23 DIAGNOSIS — I10 Essential (primary) hypertension: Secondary | ICD-10-CM | POA: Diagnosis not present

## 2016-12-23 DIAGNOSIS — E871 Hypo-osmolality and hyponatremia: Secondary | ICD-10-CM

## 2016-12-23 DIAGNOSIS — E785 Hyperlipidemia, unspecified: Secondary | ICD-10-CM

## 2016-12-23 DIAGNOSIS — K219 Gastro-esophageal reflux disease without esophagitis: Secondary | ICD-10-CM

## 2016-12-23 DIAGNOSIS — R32 Unspecified urinary incontinence: Secondary | ICD-10-CM | POA: Diagnosis not present

## 2016-12-23 DIAGNOSIS — G8929 Other chronic pain: Secondary | ICD-10-CM

## 2016-12-23 DIAGNOSIS — M549 Dorsalgia, unspecified: Secondary | ICD-10-CM | POA: Diagnosis not present

## 2016-12-23 MED ORDER — HYDROCODONE-ACETAMINOPHEN 5-325 MG PO TABS: 1.0000 | ORAL_TABLET | Freq: Two times a day (BID) | ORAL | 0 refills | Status: DC | PRN

## 2016-12-23 MED ORDER — OMEPRAZOLE 20 MG PO CPDR: 20.0000 mg | DELAYED_RELEASE_CAPSULE | Freq: Every day | ORAL | 1 refills | Status: DC | PRN

## 2016-12-23 MED ORDER — MIRABEGRON ER 25 MG PO TB24: 25.0000 mg | ORAL_TABLET | Freq: Every day | ORAL | 2 refills | Status: DC

## 2016-12-23 MED ORDER — AMLODIPINE BESYLATE 5 MG PO TABS: 5.0000 mg | ORAL_TABLET | Freq: Every day | ORAL | 3 refills | Status: DC

## 2016-12-23 MED ORDER — ALENDRONATE SODIUM 35 MG PO TABS: ORAL_TABLET | ORAL | 0 refills | Status: DC

## 2016-12-23 MED ORDER — LISINOPRIL 40 MG PO TABS: ORAL_TABLET | ORAL | 1 refills | Status: DC

## 2016-12-23 MED ORDER — PRAVASTATIN SODIUM 40 MG PO TABS: ORAL_TABLET | ORAL | 1 refills | Status: DC

## 2016-12-23 NOTE — Progress Notes (Signed)
Subjective:   Patient ID: Samantha Huerta, female    DOB: November 24, 1945, 71 y.o.   MRN: [id number] CC: Follow-up (3 month) med problems  HPI: Samantha Huerta is a 71 y.o. female presenting for Follow-up (3 month)  Indication for chronic opioid: arthritis, ongoing pain in back Medication and dose: hydrocodone 61m # pills per month: #30 Last UDS date: 12/2015 Pain contract signed (Y/N): Y Date narcotic database last reviewed (include red flags): 12/23/2016 Tries ibuprofen, naproxen Ran out about a week ago of the hydrocodone Pain at its worst gets   HTN: no CP, SOB, taking meds regularly 120-130s/60s-70s  COPD: breathing has been ok  HLD: no s/e  Incontinence: tried oxybutynin, made her thirsty so she was still getting up and down a lot  GERD: takes PPi as needed, 1-2 times a week  Tobacco: 6-8 cig a day  Relevant past medical, surgical, family and social history reviewed. Allergies and medications reviewed and updated. History  Smoking Status  . Current Every Day Smoker  . Packs/day: 0.00  . Years: 52.00  . Types: Cigarettes  Smokeless Tobacco  . Never Used    Comment: smokes 8 cig daily   ROS: Per HPI   Objective:    BP 119/68   Pulse 81   Temp (!) 97.3 F (36.3 C) (Oral)   Ht 5' 5.5" (1.664 m)   Wt 133 lb 9.6 oz (60.6 kg)   BMI 21.89 kg/m   Wt Readings from Last 3 Encounters:  12/23/16 133 lb 9.6 oz (60.6 kg)  09/27/16 133 lb (60.3 kg)  09/19/16 132 lb 9.6 oz (60.1 kg)    Gen: NAD, alert, cooperative with exam, NCAT EYES: EOMI, no conjunctival injection, or no icterus ENT: OP without erythema LYMPH: no cervical LAD CV: NRRR, normal S1/S2, no murmur, distal pulses 2+ b/l Resp: CTABL, no wheezes, normal WOB Abd: +BS, soft, NTND.  Ext: No edema, warm Neuro: Alert and oriented, strength equal b/l UE and LE, coordination grossly normal MSK: R wrist with 1.5cm cystic lesion radial side of R wrist, < 1cm ulnar side   Assessment & Plan:  CAkashawas  seen today for follow-up.  Diagnoses and all orders for this visit:  Pain management contract signed Due for utox -     ToxASSURE Select 13 (MW), Urine  Osteoporosis, unspecified osteoporosis type, unspecified pathological fracture presence Stable, cont below -     alendronate (FOSAMAX) 35 MG tablet; TAKE 1 TABLET WEEKLY (TAKE WITH 8OZ OF WATER 30 MINUTES BEFORE BREAKFAST)  Essential hypertension Stable, cont below -     amLODipine (NORVASC) 5 MG tablet; Take 1 tablet (5 mg total) by mouth daily. -     lisinopril (PRINIVIL,ZESTRIL) 40 MG tablet; TAKE ONE (1) TABLET EACH DAY -     CMP14+EGFR  Chronic back pain, unspecified back location, unspecified back pain laterality Pain medication helps keep her active, working Will continue 3Rx for #30 tabs given, enough to take once a day as needed for pain -     HYDROcodone-acetaminophen (NORCO/VICODIN) 5-325 MG tablet; Take 1 tablet by mouth 2 (two) times daily as needed for moderate pain. -     ToxASSURE Select 13 (MW), Urine -     HYDROcodone-acetaminophen (NORCO) 5-325 MG tablet; Take 1 tablet by mouth 2 (two) times daily as needed for moderate pain. DO NOT FILL UNTIL 01/23/2017 -     HYDROcodone-acetaminophen (NORCO) 5-325 MG tablet; Take 1 tablet by mouth 2 (two) times daily as needed for  moderate pain.  Gastroesophageal reflux disease without esophagitis Stable, takes apprx 1-2 times a week -     omeprazole (PRILOSEC) 20 MG capsule; Take 1 capsule (20 mg total) by mouth daily as needed.  Hyperlipidemia, unspecified hyperlipidemia type Stable, cont below -     pravastatin (PRAVACHOL) 40 MG tablet; TAKE ONE (1) TABLET EACH DAY  Urinary incontinence, unspecified type -     mirabegron ER (MYRBETRIQ) 25 MG TB24 tablet; Take 1 tablet (25 mg total) by mouth daily.   Follow up plan: Return in about 3 months (around 03/25/2017). Assunta Found, MD [place]

## 2016-12-23 NOTE — Patient Instructions (Addendum)
Try myrbetriq for urinary frequency  [employer] transportation within [facility] [phone] for help with transportation to appointments No appointments after 3pm Will need 3 day notice $6 fare total

## 2016-12-24 LAB — CMP14+EGFR
ALT: 13 IU/L (ref 0–32)
AST: 26 IU/L (ref 0–40)
Albumin/Globulin Ratio: 1.8 (ref 1.2–2.2)
Albumin: 4.5 g/dL (ref 3.5–4.8)
Alkaline Phosphatase: 90 IU/L (ref 39–117)
BUN/Creatinine Ratio: 18 (ref 12–28)
BUN: 17 mg/dL (ref 8–27)
Bilirubin Total: 0.4 mg/dL (ref 0.0–1.2)
CALCIUM: 10.1 mg/dL (ref 8.7–10.3)
CO2: 21 mmol/L (ref 20–29)
CREATININE: 0.93 mg/dL (ref 0.57–1.00)
Chloride: 86 mmol/L — ABNORMAL LOW (ref 96–106)
GFR, EST AFRICAN AMERICAN: 71 mL/min/{1.73_m2} (ref >59)
GFR, EST NON AFRICAN AMERICAN: 62 mL/min/{1.73_m2} (ref >59)
GLOBULIN, TOTAL: 2.5 g/dL (ref 1.5–4.5)
Glucose: 101 mg/dL — ABNORMAL HIGH (ref 65–99)
Potassium: 4.3 mmol/L (ref 3.5–5.2)
SODIUM: 126 mmol/L — AB (ref 134–144)
TOTAL PROTEIN: 7 g/dL (ref 6.0–8.5)

## 2016-12-26 ENCOUNTER — Ambulatory Visit (INDEPENDENT_AMBULATORY_CARE_PROVIDER_SITE_OTHER): Payer: Medicare Other | Admitting: *Deleted

## 2016-12-26 DIAGNOSIS — Z23 Encounter for immunization: Secondary | ICD-10-CM | POA: Diagnosis not present

## 2016-12-26 NOTE — Progress Notes (Signed)
HD FLUZONE AND SHINGRIX GIVEN PT TOLERATED WELL

## 2016-12-26 NOTE — Addendum Note (Signed)
Addended by: Henrietta DineSTOVALL, Lisette Mancebo C on: 12/26/2016 04:25 PM   Modules accepted: Orders

## 2016-12-27 LAB — TOXASSURE SELECT 13 (MW), URINE

## 2016-12-30 NOTE — Addendum Note (Signed)
Addended by: Johna SheriffVINCENT, Chung Chagoya L on: 12/30/2016 02:08 PM   Modules accepted: Orders

## 2016-12-31 ENCOUNTER — Telehealth: Payer: Self-pay | Admitting: Pediatrics

## 2017-01-01 ENCOUNTER — Other Ambulatory Visit: Payer: Medicare Other

## 2017-01-01 DIAGNOSIS — E871 Hypo-osmolality and hyponatremia: Secondary | ICD-10-CM

## 2017-01-01 NOTE — Telephone Encounter (Signed)
Refer to lab results.  

## 2017-01-02 LAB — BMP8+EGFR
BUN/Creatinine Ratio: 15 (ref 12–28)
BUN: 11 mg/dL (ref 8–27)
CO2: 25 mmol/L (ref 20–29)
Calcium: 10 mg/dL (ref 8.7–10.3)
Chloride: 89 mmol/L — ABNORMAL LOW (ref 96–106)
Creatinine, Ser: 0.75 mg/dL (ref 0.57–1.00)
GFR calc Af Amer: 92 mL/min/1.73
GFR calc non Af Amer: 80 mL/min/1.73
Glucose: 90 mg/dL (ref 65–99)
Potassium: 4.3 mmol/L (ref 3.5–5.2)
Sodium: 129 mmol/L — ABNORMAL LOW (ref 134–144)

## 2017-02-05 ENCOUNTER — Encounter: Payer: Self-pay | Admitting: Obstetrics & Gynecology

## 2017-03-07 ENCOUNTER — Other Ambulatory Visit: Payer: Self-pay | Admitting: *Deleted

## 2017-03-07 ENCOUNTER — Other Ambulatory Visit: Payer: Medicare Other

## 2017-03-07 DIAGNOSIS — Z1211 Encounter for screening for malignant neoplasm of colon: Secondary | ICD-10-CM | POA: Diagnosis not present

## 2017-03-07 DIAGNOSIS — M25531 Pain in right wrist: Secondary | ICD-10-CM

## 2017-03-11 LAB — FECAL OCCULT BLOOD, IMMUNOCHEMICAL: Fecal Occult Bld: POSITIVE — AB

## 2017-03-13 ENCOUNTER — Telehealth: Payer: Self-pay | Admitting: Pediatrics

## 2017-03-13 ENCOUNTER — Other Ambulatory Visit: Payer: Self-pay | Admitting: *Deleted

## 2017-03-13 ENCOUNTER — Encounter (INDEPENDENT_AMBULATORY_CARE_PROVIDER_SITE_OTHER): Payer: Self-pay | Admitting: *Deleted

## 2017-03-13 DIAGNOSIS — Z1211 Encounter for screening for malignant neoplasm of colon: Secondary | ICD-10-CM

## 2017-03-13 DIAGNOSIS — Z1212 Encounter for screening for malignant neoplasm of rectum: Principal | ICD-10-CM

## 2017-03-13 NOTE — Telephone Encounter (Signed)
Patient called.

## 2017-03-17 ENCOUNTER — Encounter (INDEPENDENT_AMBULATORY_CARE_PROVIDER_SITE_OTHER): Payer: Self-pay | Admitting: *Deleted

## 2017-03-20 ENCOUNTER — Ambulatory Visit (INDEPENDENT_AMBULATORY_CARE_PROVIDER_SITE_OTHER): Payer: Medicare Other

## 2017-03-20 ENCOUNTER — Ambulatory Visit (INDEPENDENT_AMBULATORY_CARE_PROVIDER_SITE_OTHER): Payer: Medicare Other | Admitting: Pediatrics

## 2017-03-20 ENCOUNTER — Encounter: Payer: Self-pay | Admitting: Pediatrics

## 2017-03-20 VITALS — BP 129/75 | HR 89 | Temp 97.4°F | Ht 65.5 in | Wt 127.4 lb

## 2017-03-20 DIAGNOSIS — R1084 Generalized abdominal pain: Secondary | ICD-10-CM

## 2017-03-20 DIAGNOSIS — R32 Unspecified urinary incontinence: Secondary | ICD-10-CM

## 2017-03-20 DIAGNOSIS — E871 Hypo-osmolality and hyponatremia: Secondary | ICD-10-CM | POA: Diagnosis not present

## 2017-03-20 DIAGNOSIS — I10 Essential (primary) hypertension: Secondary | ICD-10-CM | POA: Diagnosis not present

## 2017-03-20 DIAGNOSIS — G8929 Other chronic pain: Secondary | ICD-10-CM | POA: Diagnosis not present

## 2017-03-20 DIAGNOSIS — R109 Unspecified abdominal pain: Secondary | ICD-10-CM | POA: Diagnosis not present

## 2017-03-20 DIAGNOSIS — R918 Other nonspecific abnormal finding of lung field: Secondary | ICD-10-CM | POA: Diagnosis not present

## 2017-03-20 DIAGNOSIS — M549 Dorsalgia, unspecified: Secondary | ICD-10-CM | POA: Diagnosis not present

## 2017-03-20 LAB — URINALYSIS, COMPLETE
BILIRUBIN UA: NEGATIVE
Glucose, UA: NEGATIVE
Ketones, UA: NEGATIVE
Leukocytes, UA: NEGATIVE
Nitrite, UA: NEGATIVE
PH UA: 6 (ref 5.0–7.5)
PROTEIN UA: NEGATIVE
RBC UA: NEGATIVE
Urobilinogen, Ur: 0.2 mg/dL (ref 0.2–1.0)

## 2017-03-20 LAB — MICROSCOPIC EXAMINATION
RBC, UA: NONE SEEN /hpf (ref 0–?)
RENAL EPITHEL UA: NONE SEEN /HPF

## 2017-03-20 MED ORDER — HYDROCODONE-ACETAMINOPHEN 5-325 MG PO TABS: 1.0000 | ORAL_TABLET | Freq: Two times a day (BID) | ORAL | 0 refills | Status: DC | PRN

## 2017-03-20 MED ORDER — AMLODIPINE BESYLATE 5 MG PO TABS: 5.0000 mg | ORAL_TABLET | Freq: Every day | ORAL | 1 refills | Status: DC

## 2017-03-20 MED ORDER — MIRABEGRON ER 25 MG PO TB24: 25.0000 mg | ORAL_TABLET | Freq: Every day | ORAL | 1 refills | Status: DC

## 2017-03-20 MED ORDER — LISINOPRIL 40 MG PO TABS: ORAL_TABLET | ORAL | 1 refills | Status: DC

## 2017-03-20 NOTE — Progress Notes (Signed)
Subjective:   Patient ID: Samantha Huerta, female    DOB: 25-May-1945, 72 y.o.   MRN: [id number] CC: Follow-up (3 month) abd pain, follow up HPI: [name] is a 72 y.o. female presenting for Follow-up (3 month)  For past 2 weeks has had nausea with food off and on, started night before Christmas Threw up clear then "green green" emesis Has thrown up twice since then, most recently 4 days ago Has stomach pain with food, small amounts dont bother her as much, eating slowly helps Has tried omeprazole, made her stomach hurt more Regular bowel movements, no blood in stool, stooling every day Lots of belching No stomach pain between meals, no pain now No fevers Has been drinking meal replacement shakes, doesn't have pain, doesn't cause nausea with them Has a good appetite but due to pain in her stomach is having to change and limit her food intake as above  Had a cheeseburger yesterday for the first time in a while, did fine with it, no pain Some foods feel like they get caught when she swallows, has been coming and going for a few months Eating slowly helps keep it from happening  Positive FIT test: referred to GI  Hyponatremia: increased solute intake since last visit until recent abd pain as above  Indication for chronic opioid: arthritis in her back Pain up to 10/10 at times, worse at work when she is lifting some things Pain gets manageable with pain medication Medication and dose: '5mg'$   # pills per month: 30 Last UDS date: 12/2016 Pain contract signed (Y/N): Y Date narcotic database last reviewed (include red flags): today Having more back pain in the past two weeks   Relevant past medical, surgical, family and social history reviewed. Allergies and medications reviewed and updated. Social History   Tobacco Use  Smoking Status Current Every Day Smoker  . Packs/day: 0.00  . Years: 52.00  . Pack years: 0.00  . Types: Cigarettes  Smokeless Tobacco Never Used    Tobacco Comment   smokes 8 cig daily   ROS: Per HPI   Objective:    BP 129/75   Pulse 89   Temp (!) 97.4 F (36.3 C) (Oral)   Ht 5' 5.5" (1.664 m)   Wt 127 lb 6.4 oz (57.8 kg)   BMI 20.88 kg/m   Wt Readings from Last 3 Encounters:  03/20/17 127 lb 6.4 oz (57.8 kg)  12/23/16 133 lb 9.6 oz (60.6 kg)  09/27/16 133 lb (60.3 kg)    Gen: NAD, alert, cooperative with exam, NCAT EYES: EOMI, no conjunctival injection, or no icterus ENT:  TMs pearly gray b/l, OP without erythema LYMPH: no cervical LAD CV: NRRR, normal S1/S2, no murmur, distal pulses 2+ b/l Resp: CTABL, no wheezes, normal WOB Abd: +BS, soft, NTND. Neg murphys sign, no guarding or organomegaly Ext: No edema, warm Neuro: Alert and oriented, strength equal b/l UE and LE, coordination grossly normal MSK: no point tenderness over spine or paraspinal muscles  Assessment & Plan:  Halie was seen today for follow-up med problems and   Diagnoses and all orders for this visit:  Generalized abdominal pain Abd pain improving, now tolerating larger meals Will get blood work Any worsening symptoms needs to be seen for further imaging -     CMP14+EGFR -     CBC with Differential/Platelet -     DG Abd 1 View; Future  Essential hypertension Stable, cont below -     Urinalysis, Complete -  amLODipine (NORVASC) 5 MG tablet; Take 1 tablet (5 mg total) by mouth daily. -     lisinopril (PRINIVIL,ZESTRIL) 40 MG tablet; TAKE ONE (1) TABLET EACH DAY  Chronic back pain, unspecified back location, unspecified back pain laterality Stable, uses below when pain becomes severe to help her continue her ADLs, Allows her to continue working. 3 Rx is of below given tabs -     HYDROcodone-acetaminophen (NORCO) 5-325 MG tablet; Take 1 tablet by mouth 2 (two) times daily as needed for moderate pain. -     HYDROcodone-acetaminophen (NORCO/VICODIN) 5-325 MG tablet; Take 1 tablet by mouth 2 (two) times daily as needed for moderate pain. -      HYDROcodone-acetaminophen (NORCO) 5-325 MG tablet; Take 1 tablet by mouth 2 (two) times daily as needed for moderate pain.  Urinary incontinence, unspecified type Continue below, helping with symptoms -     mirabegron ER (MYRBETRIQ) 25 MG TB24 tablet; Take 1 tablet (25 mg total) by mouth daily.  Abnormal findings on diagnostic imaging of lung CT scan 12 months ago with small nodules that needed follow-up in 12 months -     CT Chest Wo Contrast; Future  Hyponatremia -     Sodium, urine, random -     Creatinine, urine, random  Other orders -     Microscopic Examination   Follow up plan: Return in about 4 weeks (around 04/17/2017).  Due to abdominal pain, sooner if needed. Assunta Found, MD [place]

## 2017-03-21 LAB — CBC WITH DIFFERENTIAL/PLATELET
BASOS: 0 %
Basophils Absolute: 0 10*3/uL (ref 0.0–0.2)
EOS (ABSOLUTE): 0.1 10*3/uL (ref 0.0–0.4)
Eos: 2 %
HEMATOCRIT: 38.3 % (ref 34.0–46.6)
Hemoglobin: 13 g/dL (ref 11.1–15.9)
Immature Grans (Abs): 0 10*3/uL (ref 0.0–0.1)
Immature Granulocytes: 0 %
LYMPHS ABS: 1 10*3/uL (ref 0.7–3.1)
Lymphs: 14 %
MCH: 30.7 pg (ref 26.6–33.0)
MCHC: 33.9 g/dL (ref 31.5–35.7)
MCV: 91 fL (ref 79–97)
MONOS ABS: 0.8 10*3/uL (ref 0.1–0.9)
Monocytes: 11 %
NEUTROS PCT: 73 %
Neutrophils Absolute: 5.2 10*3/uL (ref 1.4–7.0)
PLATELETS: 671 10*3/uL — AB (ref 150–379)
RBC: 4.23 x10E6/uL (ref 3.77–5.28)
RDW: 13.1 % (ref 12.3–15.4)
WBC: 7.1 10*3/uL (ref 3.4–10.8)

## 2017-03-21 LAB — CMP14+EGFR
A/G RATIO: 1.7 (ref 1.2–2.2)
ALBUMIN: 4.3 g/dL (ref 3.5–4.8)
ALK PHOS: 59 IU/L (ref 39–117)
ALT: 13 IU/L (ref 0–32)
AST: 24 IU/L (ref 0–40)
BUN / CREAT RATIO: 19 (ref 12–28)
BUN: 13 mg/dL (ref 8–27)
Bilirubin Total: <0.2 mg/dL (ref 0.0–1.2)
CO2: 24 mmol/L (ref 20–29)
Calcium: 10 mg/dL (ref 8.7–10.3)
Chloride: 96 mmol/L (ref 96–106)
Creatinine, Ser: 0.68 mg/dL (ref 0.57–1.00)
GFR calc Af Amer: 101 mL/min/{1.73_m2} (ref >59)
GFR calc non Af Amer: 88 mL/min/{1.73_m2} (ref >59)
Globulin, Total: 2.6 g/dL (ref 1.5–4.5)
Glucose: 84 mg/dL (ref 65–99)
POTASSIUM: 4.7 mmol/L (ref 3.5–5.2)
SODIUM: 134 mmol/L (ref 134–144)
Total Protein: 6.9 g/dL (ref 6.0–8.5)

## 2017-03-21 LAB — SODIUM, URINE, RANDOM: Sodium, Ur: 31 mmol/L

## 2017-03-21 LAB — CREATININE, URINE, RANDOM: Creatinine, Urine: 19 mg/dL

## 2017-03-28 ENCOUNTER — Telehealth: Payer: Self-pay

## 2017-03-28 NOTE — Telephone Encounter (Signed)
Got a letter from GI office in [place] that I was referred to that said they can not schedule me for colonscopy for about 4 months.  Is that OK with you?

## 2017-03-28 NOTE — Telephone Encounter (Signed)
That's fine, if you would rather get it done sooner we can try sending you to [place]? OK to put in referral if preferred.

## 2017-04-03 ENCOUNTER — Ambulatory Visit (HOSPITAL_COMMUNITY)
Admission: RE | Admit: 2017-04-03 | Discharge: 2017-04-03 | Disposition: A | Discharge status: Home / Self Care | Payer: Medicare Other | Source: Ambulatory Visit | Attending: Pediatrics | Admitting: Pediatrics

## 2017-04-03 ENCOUNTER — Encounter: Payer: Self-pay | Admitting: Pediatrics

## 2017-04-03 DIAGNOSIS — I2510 Atherosclerotic heart disease of native coronary artery without angina pectoris: Secondary | ICD-10-CM | POA: Insufficient documentation

## 2017-04-03 DIAGNOSIS — R911 Solitary pulmonary nodule: Secondary | ICD-10-CM | POA: Insufficient documentation

## 2017-04-03 DIAGNOSIS — I700 Atherosclerosis of aorta: Secondary | ICD-10-CM | POA: Insufficient documentation

## 2017-04-03 DIAGNOSIS — R918 Other nonspecific abnormal finding of lung field: Secondary | ICD-10-CM | POA: Insufficient documentation

## 2017-04-17 ENCOUNTER — Ambulatory Visit: Payer: Medicare Other | Admitting: Pediatrics

## 2017-04-24 ENCOUNTER — Telehealth: Payer: Self-pay | Admitting: Pediatrics

## 2017-04-24 ENCOUNTER — Other Ambulatory Visit: Payer: Self-pay | Admitting: *Deleted

## 2017-04-24 DIAGNOSIS — Z1211 Encounter for screening for malignant neoplasm of colon: Secondary | ICD-10-CM

## 2017-04-24 NOTE — Telephone Encounter (Signed)
If pt is OK with going to [place] OK to put referral in for there

## 2017-04-24 NOTE — Telephone Encounter (Signed)
Ok with patient to go to [place] if can not bee seen in [place].  Referral placed.

## 2017-04-27 DIAGNOSIS — R05 Cough: Secondary | ICD-10-CM | POA: Diagnosis not present

## 2017-04-27 DIAGNOSIS — J40 Bronchitis, not specified as acute or chronic: Secondary | ICD-10-CM | POA: Diagnosis not present

## 2017-04-27 DIAGNOSIS — J449 Chronic obstructive pulmonary disease, unspecified: Secondary | ICD-10-CM | POA: Diagnosis not present

## 2017-05-09 DIAGNOSIS — H1851 Endothelial corneal dystrophy: Secondary | ICD-10-CM | POA: Diagnosis not present

## 2017-05-09 DIAGNOSIS — H401132 Primary open-angle glaucoma, bilateral, moderate stage: Secondary | ICD-10-CM | POA: Diagnosis not present

## 2017-05-09 DIAGNOSIS — Z961 Presence of intraocular lens: Secondary | ICD-10-CM | POA: Diagnosis not present

## 2017-05-23 ENCOUNTER — Ambulatory Visit: Payer: Medicare Other | Admitting: Pediatrics

## 2017-05-30 ENCOUNTER — Encounter: Payer: Self-pay | Admitting: Pediatrics

## 2017-05-30 ENCOUNTER — Ambulatory Visit (INDEPENDENT_AMBULATORY_CARE_PROVIDER_SITE_OTHER): Payer: Medicare Other | Admitting: Pediatrics

## 2017-05-30 VITALS — BP 132/83 | HR 93 | Temp 97.2°F | Ht 65.5 in | Wt 127.4 lb

## 2017-05-30 DIAGNOSIS — M549 Dorsalgia, unspecified: Secondary | ICD-10-CM | POA: Diagnosis not present

## 2017-05-30 DIAGNOSIS — E785 Hyperlipidemia, unspecified: Secondary | ICD-10-CM | POA: Diagnosis not present

## 2017-05-30 DIAGNOSIS — G8929 Other chronic pain: Secondary | ICD-10-CM

## 2017-05-30 DIAGNOSIS — R195 Other fecal abnormalities: Secondary | ICD-10-CM

## 2017-05-30 DIAGNOSIS — I10 Essential (primary) hypertension: Secondary | ICD-10-CM

## 2017-05-30 DIAGNOSIS — R911 Solitary pulmonary nodule: Secondary | ICD-10-CM

## 2017-05-30 MED ORDER — HYDROCODONE-ACETAMINOPHEN 5-325 MG PO TABS: 1.0000 | ORAL_TABLET | Freq: Two times a day (BID) | ORAL | 0 refills | Status: DC | PRN

## 2017-05-30 NOTE — Progress Notes (Signed)
Subjective:   Patient ID: Samantha Huerta, female    DOB: November 28, 1945, 72 y.o.   MRN: [id number] CC: Follow-up (3 month)  HPI: Samantha Huerta is a 72 y.o. female presenting for Follow-up (3 month)  Continues to be irritated by cough. Coughing up some phlegm.  Was seen about 3 weeks for bronchitis at urgent care.  Treated with azithromycin and prednisone.  Cough is been ongoing since then.  Continues to smoke about 8-9 cigarettes a day.  Interested in quitting.  Has been very hard.  Indication for chronic opioid: Arthritis in low back, hands Medication and dose: Hydrocodone 5/325 takes 1-2 times a day # pills per month: 30 Last UDS date: 12/2016 Opioid Treatment Agreement signed (Y/N): Yes [facility] reviewed this encounter (include red flags):   Yes  Stays active.  Currently working.  Pain medication helps with the pain from arthritis in her hands.  No side effects.  Having regular bowel movements.  No blood in her stools.  Recent positive fit test, patient has been referred to gastroenterology.  Pulm nodules: Due for repeat CT scan in February 2020   Relevant past medical, surgical, family and social history reviewed. Allergies and medications reviewed and updated. Social History   Tobacco Use  Smoking Status Current Every Day Smoker  . Packs/day: 0.00  . Years: 52.00  . Pack years: 0.00  . Types: Cigarettes  Smokeless Tobacco Never Used  Tobacco Comment   smokes 8 cig daily   ROS: Per HPI   Objective:    BP 132/83   Pulse 93   Temp (!) 97.2 F (36.2 C) (Oral)   Ht 5' 5.5" (1.664 m)   Wt 127 lb 6.4 oz (57.8 kg)   BMI 20.88 kg/m   Wt Readings from Last 3 Encounters:  05/30/17 127 lb 6.4 oz (57.8 kg)  03/20/17 127 lb 6.4 oz (57.8 kg)  12/23/16 133 lb 9.6 oz (60.6 kg)    Gen: NAD, alert, cooperative with exam, NCAT EYES: EOMI, no conjunctival injection, or no icterus ENT:   OP without erythema LYMPH: no cervical LAD CV: NRRR, normal S1/S2, no murmur, distal  pulses 2+ b/l Resp: CTABL, no wheezes, normal WOB Abd: +BS, soft, NTND. no guarding or organomegaly Ext: No edema, warm Neuro: Alert and oriented MSK: No point tenderness over spine.  Bony changes fingers bilaterally  Assessment & Plan:  Samantha Huerta was seen today for follow-up multiple medical problems.  Diagnoses and all orders for this visit:  Positive FIT (fecal immunochemical test) Referral to GI in [place]. -     Ambulatory referral to Gastroenterology  Chronic back pain, unspecified back location, unspecified back pain laterality Some ongoing pain.  Pain medication does not last throughout the day.  She does not need it every single day.  Some days she does needed twice a day.  Currently prescribed 30 tablets in a month.  She think she would be able to be more active if pain was under better control.  Increase to #45 tablets a month.  Return precautions discussed.  Do not take with other medications that can cause sleepiness. -     HYDROcodone-acetaminophen (NORCO) 5-325 MG tablet; Take 1 tablet by mouth 2 (two) times daily as needed for moderate pain. -     HYDROcodone-acetaminophen (NORCO) 5-325 MG tablet; Take 1 tablet by mouth 2 (two) times daily as needed for moderate pain. -     HYDROcodone-acetaminophen (NORCO/VICODIN) 5-325 MG tablet; Take 1 tablet by mouth 2 (two) times  daily as needed for moderate pain.  Essential hypertension Stable, continue current medicine  Hyperlipidemia, unspecified hyperlipidemia type Stable, continue current medicines  Lung nodule Due for repeat CT scan at the beginning at 2020  Follow up plan: 3 months Rex Krasarol Jiraiya Mcewan, MD [facility]

## 2017-06-09 ENCOUNTER — Other Ambulatory Visit: Payer: Self-pay | Admitting: Pediatrics

## 2017-06-09 ENCOUNTER — Encounter: Payer: Self-pay | Admitting: Internal Medicine

## 2017-06-20 ENCOUNTER — Other Ambulatory Visit: Payer: Self-pay | Admitting: *Deleted

## 2017-06-20 ENCOUNTER — Telehealth: Payer: Self-pay | Admitting: Pediatrics

## 2017-06-20 DIAGNOSIS — G8929 Other chronic pain: Secondary | ICD-10-CM

## 2017-06-20 DIAGNOSIS — M810 Age-related osteoporosis without current pathological fracture: Secondary | ICD-10-CM

## 2017-06-20 DIAGNOSIS — M5442 Lumbago with sciatica, left side: Secondary | ICD-10-CM

## 2017-06-20 DIAGNOSIS — M549 Dorsalgia, unspecified: Principal | ICD-10-CM

## 2017-06-20 NOTE — Telephone Encounter (Signed)
Orthopedic referral placed.  Patient aware

## 2017-06-23 ENCOUNTER — Other Ambulatory Visit: Payer: Self-pay | Admitting: Pediatrics

## 2017-06-23 DIAGNOSIS — M810 Age-related osteoporosis without current pathological fracture: Secondary | ICD-10-CM

## 2017-07-28 ENCOUNTER — Other Ambulatory Visit: Payer: Self-pay | Admitting: Pediatrics

## 2017-07-28 DIAGNOSIS — E785 Hyperlipidemia, unspecified: Secondary | ICD-10-CM

## 2017-07-31 ENCOUNTER — Encounter (INDEPENDENT_AMBULATORY_CARE_PROVIDER_SITE_OTHER): Payer: Self-pay | Admitting: Orthopaedic Surgery

## 2017-07-31 ENCOUNTER — Encounter: Payer: Self-pay | Admitting: Gastroenterology

## 2017-07-31 ENCOUNTER — Ambulatory Visit (INDEPENDENT_AMBULATORY_CARE_PROVIDER_SITE_OTHER): Payer: Medicare Other | Admitting: Orthopaedic Surgery

## 2017-07-31 ENCOUNTER — Encounter: Payer: Self-pay | Admitting: *Deleted

## 2017-07-31 ENCOUNTER — Ambulatory Visit: Payer: Medicare Other | Admitting: Gastroenterology

## 2017-07-31 ENCOUNTER — Other Ambulatory Visit: Payer: Self-pay | Admitting: *Deleted

## 2017-07-31 ENCOUNTER — Telehealth: Payer: Self-pay | Admitting: *Deleted

## 2017-07-31 ENCOUNTER — Ambulatory Visit (INDEPENDENT_AMBULATORY_CARE_PROVIDER_SITE_OTHER): Payer: Self-pay

## 2017-07-31 VITALS — BP 157/82 | HR 96 | Ht 66.0 in | Wt 126.0 lb

## 2017-07-31 DIAGNOSIS — R195 Other fecal abnormalities: Secondary | ICD-10-CM | POA: Diagnosis not present

## 2017-07-31 DIAGNOSIS — M25531 Pain in right wrist: Secondary | ICD-10-CM

## 2017-07-31 MED ORDER — PEG 3350-KCL-NA BICARB-NACL 420 G PO SOLR: 4000.0000 mL | Freq: Once | ORAL | 0 refills | Status: AC

## 2017-07-31 NOTE — Assessment & Plan Note (Signed)
72 year old female with Hemoccult positive stool, no anemia.  No overt GI bleeding.  Bowel movements typically every other day, tendency towards [facility] 3-4.  Plan on colonoscopy in the near future with deep sedation given chronic pain medication use.  I have discussed the risks, alternatives, benefits with regards to but not limited to the risk of reaction to medication, bleeding, infection, perforation and the patient is agreeable to proceed. Written consent to be obtained.  Encouraged use of MiraLAX 1 capful at bedtime on day she does not have a good bowel movement.  Discussed importance of keeping bowels regular prior to proceeding with colonoscopy so that we can be more successful at adequate bowel prep.

## 2017-07-31 NOTE — Patient Instructions (Signed)
1. Colonoscopy as scheduled. See separate instructions.  2. Consider adding Miralax one capful at bedtime on days you do not have a good bowel movement. This can be a very effective stool softener.

## 2017-07-31 NOTE — Progress Notes (Signed)
Office Visit Note   Patient: Samantha Huerta           Date of Birth: March 13, 1945           MRN: [id number] Visit Date: 07/31/2017              Requested by: Johna Sheriff, MD [address] [place], [place] [address] PCP: Johna Sheriff, MD   Assessment & Plan: Visit Diagnoses:  1. Pain in right wrist     Plan: Patient seen Dr. Amanda Pea in the past and had shoulder surgery by him.  She could call Dr. Amanda Pea for evaluation of her right wrist to discuss possible surgical treatment.  We reviewed x-rays with her today and I gave her copies.  Follow-Up Instructions: No follow-ups on file.   Orders:  Orders Placed This Encounter  Procedures  . XR Wrist Complete Right   No orders of the defined types were placed in this encounter.     Procedures: No procedures performed   Clinical Data: No additional findings.   Subjective: Chief Complaint  Patient presents with  . Right Wrist - Pain    HPI 72 year old female had previous carpal tunnel release by me around 7 or more years ago.  She had one episode where she slipped on the ice slammed her wrist into the car when she fell.  She is had previous shoulder surgery by Dr. Amanda Pea and is noticed increased pain in her right wrist decreased range of motion swelling with ulnar swelling stiffness and pain with motion.  She is been wearing a splint at work which helps to some degree.  She is also seems on her wrist.  She denies past history of fracture to her wrist.  He is under pain contract with Dr. Oswaldo Done and uses some hydrocodone 5/325 she gets 45 tablets a month.  Review of Systems pretension hyperlipidemia GERD, COPD smokes 8 cigarettes a day, chronic back pain on pain management.  Lung nodule.  Glaucoma.  She works as a Conservation officer, nature at [employer].   Objective: Vital Signs: BP (!) 157/82   Pulse 96   Ht  (1.676 m)   Wt 126 lb (57.2 kg)   BMI 20.34 kg/m   Physical Exam  Constitutional: She is oriented to person, place,  and time. She appears well-developed.  HENT:  Head: Normocephalic.  Right Ear: External ear normal.  Left Ear: External ear normal.  Eyes: Pupils are equal, round, and reactive to light.  Neck: No tracheal deviation present. No thyromegaly present.  Cardiovascular: Normal rate.  Pulmonary/Chest: Effort normal.  Abdominal: Soft.  Neurological: She is alert and oriented to person, place, and time.  Skin: Skin is warm and dry.  Psychiatric: She has a normal mood and affect. Her behavior is normal.    Ortho Exam patient has 50% right wrist dorsiflexion plantarflexion.  There is a 2 cm ganglion adjacent to the radial artery on the volar radial aspect of the wrist.  A second 1 lateral to the ulna more volarly which are both tender.  She has crepitus with wrist range of motion.  Well-healed carpal tunnel incision.  Degenerative changes are noted in the PIP/DIP small finger with radial deviation of the DIP joint.  Dorsal osteophytes at the index finger.  Carpal tunnel release incision on the volar aspect with thenar atrophy and the surgery was done she thinks 7 years ago.  Patient has minimal brachial plexus tenderness some discomfort with cervical range of motion.  Specialty Comments:  No specialty comments available.  Imaging: No results found.   PMFS History: Patient Active Problem List   Diagnosis Date Noted  . Heme positive stool 07/31/2017  . Lung nodule 04/03/2017  . Pain management contract signed 01/05/2016  . Chronic back pain 05/12/2013  . Hypertension 07/27/2012  . Hyperlipidemia 07/27/2012  . GERD (gastroesophageal reflux disease) 07/27/2012  . COPD bronchitis 07/27/2012  . Glaucoma 07/27/2012   Past Medical History:  Diagnosis Date  . Anxiety   . Asthma   . GERD (gastroesophageal reflux disease)   . Glaucoma   . Hyperlipidemia   . Hypertension   . Osteopenia     Family History  Problem Relation Age of Onset  . Hip fracture Mother   . Cancer Sister         breast  . Glaucoma Sister   . Diabetes Sister   . Glaucoma Sister   . Glaucoma Sister   . Glaucoma Sister   . Other Brother        killed in MVA  . Colon cancer Neg Hx     Past Surgical History:  Procedure Laterality Date  . ABDOMINAL HYSTERECTOMY    . CARPAL TUNNEL RELEASE Bilateral   . ELBOW SURGERY Right   . SHOULDER SURGERY Left    Social History   Occupational History  . Not on file  Tobacco Use  . Smoking status: Current Every Day Smoker    Packs/day: 0.00    Years: 52.00    Pack years: 0.00    Types: Cigarettes  . Smokeless tobacco: Never Used  . Tobacco comment: smokes 8 cig daily  Substance and Sexual Activity  . Alcohol use: Yes    Comment: 2-3 times a week  . Drug use: No  . Sexual activity: Never    Birth control/protection: Surgical    Comment: hyst

## 2017-07-31 NOTE — Progress Notes (Signed)
Primary Care Physician:  Johna Sheriff, MD  Primary Gastroenterologist:  Roetta Sessions, MD   Chief Complaint  Patient presents with  . positive FIT    HPI:  Samantha Huerta is a 72 y.o. female here at the request of Dr. Oswaldo Done for further evaluation of Hemoccult positive stool.  Patient tested positive for blood in her stool back in December.  She denies any overt blood in the stool or melena.  Hemoglobin is normal.  BM every other day. [place] 3-4.  No abdominal pain. No heartburn on omeprazole. Takes only as needed. No dysphagia.  Always a small eater, eats multiple meals throughout the day.  Weight has been stable.  Remote colonoscopy about 15 years ago per patient, was negative.  Current Outpatient Medications  Medication Sig Dispense Refill  . ADVAIR DISKUS 250-50 MCG/DOSE AEPB USE 1 INHALATION TWICE DAILY 60 each 2  . alendronate (FOSAMAX) 35 MG tablet TAKE 1 TABLET WEEKLY (TAKE WITH 8OZ OF WATER 30 MINUTES BEFORE BREAKFAST) 12 tablet 0  . amLODipine (NORVASC) 5 MG tablet Take 1 tablet (5 mg total) by mouth daily. 90 tablet 1  . brinzolamide (AZOPT) 1 % ophthalmic suspension Place 1 drop into both eyes 2 (two) times daily.    . Cholecalciferol (VITAMIN D PO) Take by mouth as needed.    . Cyanocobalamin (VITAMIN B-12 PO) Take by mouth as needed.    Marland Kitchen HYDROcodone-acetaminophen (NORCO) 5-325 MG tablet Take 1 tablet by mouth 2 (two) times daily as needed for moderate pain. 45 tablet 0  . latanoprost (XALATAN) 0.005 % ophthalmic solution Place 1 drop into both eyes at bedtime.    Marland Kitchen lisinopril (PRINIVIL,ZESTRIL) 40 MG tablet TAKE ONE (1) TABLET EACH DAY 90 tablet 1  . omeprazole (PRILOSEC) 20 MG capsule Take 1 capsule (20 mg total) by mouth daily as needed. 90 capsule 1  . pravastatin (PRAVACHOL) 40 MG tablet TAKE ONE (1) TABLET EACH DAY 90 tablet 1   No current facility-administered medications for this visit.     Allergies as of 07/31/2017  . (No Known Allergies)    Past  Medical History:  Diagnosis Date  . Anxiety   . Asthma   . GERD (gastroesophageal reflux disease)   . Glaucoma   . Hyperlipidemia   . Hypertension   . Osteopenia     Past Surgical History:  Procedure Laterality Date  . ABDOMINAL HYSTERECTOMY    . CARPAL TUNNEL RELEASE Bilateral   . ELBOW SURGERY Right   . SHOULDER SURGERY Left     Family History  Problem Relation Age of Onset  . Hip fracture Mother   . Cancer Sister        breast  . Glaucoma Sister   . Diabetes Sister   . Glaucoma Sister   . Glaucoma Sister   . Glaucoma Sister   . Other Brother        killed in MVA  . Colon cancer Neg Hx     Social History   Socioeconomic History  . Marital status: Divorced    Spouse name: Not on file  . Number of children: Not on file  . Years of education: Not on file  . Highest education level: Not on file  Occupational History  . Not on file  Social Needs  . Financial resource strain: Not on file  . Food insecurity:    Worry: Not on file    Inability: Not on file  . Transportation needs:    Medical:  Not on file    Non-medical: Not on file  Tobacco Use  . Smoking status: Current Every Day Smoker    Packs/day: 0.00    Years: 52.00    Pack years: 0.00    Types: Cigarettes  . Smokeless tobacco: Never Used  . Tobacco comment: smokes 8 cig daily  Substance and Sexual Activity  . Alcohol use: Yes    Comment: 2-3 times a week  . Drug use: No  . Sexual activity: Never    Birth control/protection: Surgical    Comment: hyst  Lifestyle  . Physical activity:    Days per week: Not on file    Minutes per session: Not on file  . Stress: Not on file  Relationships  . Social connections:    Talks on phone: Not on file    Gets together: Not on file    Attends religious service: Not on file    Active member of club or organization: Not on file    Attends meetings of clubs or organizations: Not on file    Relationship status: Not on file  . Intimate partner violence:     Fear of current or ex partner: Not on file    Emotionally abused: Not on file    Physically abused: Not on file    Forced sexual activity: Not on file  Other Topics Concern  . Not on file  Social History Narrative  . Not on file      ROS:  General: Negative for anorexia, weight loss, fever, chills, fatigue, weakness. Eyes: Negative for vision changes.  ENT: Negative for hoarseness, difficulty swallowing , nasal congestion. CV: Negative for chest pain, angina, palpitations, dyspnea on exertion, peripheral edema.  Respiratory: Negative for dyspnea at rest, dyspnea on exertion, cough, sputum, wheezing.  GI: See history of present illness. GU:  Negative for dysuria, hematuria, urinary incontinence, urinary frequency, nocturnal urination.  MS: Negative for joint pain, low back pain.  Derm: Negative for rash or itching.  Neuro: Negative for weakness, abnormal sensation, seizure, frequent headaches, memory loss, confusion.  Psych: Negative for anxiety, depression, suicidal ideation, hallucinations.  Endo: Negative for unusual weight change.  Heme: Negative for bruising or bleeding. Allergy: Negative for rash or hives.    Physical Examination:  BP 138/77   Pulse 96   Temp (!) 97.3 F (36.3 C) (Oral)   Ht  (1.676 m)   Wt 126 lb 6.4 oz (57.3 kg)   BMI 20.40 kg/m    General: Well-nourished, well-developed in no acute distress.  Head: Normocephalic, atraumatic.   Eyes: Conjunctiva pink, no icterus. Mouth: Oropharyngeal mucosa moist and pink , no lesions erythema or exudate. Neck: Supple without thyromegaly, masses, or lymphadenopathy.  Lungs: Clear to auscultation bilaterally.  Heart: Regular rate and rhythm, no murmurs rubs or gallops.  Abdomen: Bowel sounds are normal, nontender, nondistended, no hepatosplenomegaly or masses, no abdominal bruits or    hernia , no rebound or guarding.   Rectal: Deferred Extremities: No lower extremity edema. No clubbing or deformities.   Neuro: Alert and oriented x 4 , grossly normal neurologically.  Skin: Warm and dry, no rash or jaundice.   Psych: Alert and cooperative, normal mood and affect.  Labs: Lab Results  Component Value Date   CREATININE 0.68 03/20/2017   BUN 13 03/20/2017   NA 134 03/20/2017   K 4.7 03/20/2017   CL 96 03/20/2017   CO2 24 03/20/2017   Lab Results  Component Value Date  ALT 13 03/20/2017   AST 24 03/20/2017   ALKPHOS 59 03/20/2017   BILITOT <0.2 03/20/2017   Lab Results  Component Value Date   WBC 7.1 03/20/2017   HGB 13.0 03/20/2017   HCT 38.3 03/20/2017   MCV 91 03/20/2017   PLT 671 (H) 03/20/2017     Imaging Studies: No results found.

## 2017-07-31 NOTE — Telephone Encounter (Signed)
Pre-op scheduled for 09/15/17 at 12:45pm. Letter mailed. Pt aware.

## 2017-08-11 ENCOUNTER — Ambulatory Visit: Payer: Medicare Other | Admitting: Nurse Practitioner

## 2017-08-11 ENCOUNTER — Telehealth: Payer: Self-pay | Admitting: *Deleted

## 2017-08-11 NOTE — Telephone Encounter (Signed)
Spoke with patient and she is rescheduled for 10/20/17 at 9:15am. New prep instructions has been mailed. [name] made aware of change dates/appts. Pre-op scheduled for 10/13/17 at 11:00am. Letter mailed.

## 2017-08-11 NOTE — Telephone Encounter (Signed)
[name] called and stated she had a VM from patient needing to reschedule her TCS for 09/18/17

## 2017-08-22 ENCOUNTER — Ambulatory Visit (INDEPENDENT_AMBULATORY_CARE_PROVIDER_SITE_OTHER): Payer: Medicare Other | Admitting: Pediatrics

## 2017-08-22 ENCOUNTER — Encounter: Payer: Self-pay | Admitting: Pediatrics

## 2017-08-22 DIAGNOSIS — E785 Hyperlipidemia, unspecified: Secondary | ICD-10-CM

## 2017-08-22 DIAGNOSIS — M549 Dorsalgia, unspecified: Secondary | ICD-10-CM | POA: Diagnosis not present

## 2017-08-22 DIAGNOSIS — M810 Age-related osteoporosis without current pathological fracture: Secondary | ICD-10-CM | POA: Diagnosis not present

## 2017-08-22 DIAGNOSIS — G8929 Other chronic pain: Secondary | ICD-10-CM | POA: Diagnosis not present

## 2017-08-22 DIAGNOSIS — K219 Gastro-esophageal reflux disease without esophagitis: Secondary | ICD-10-CM | POA: Diagnosis not present

## 2017-08-22 DIAGNOSIS — I10 Essential (primary) hypertension: Secondary | ICD-10-CM

## 2017-08-22 MED ORDER — OMEPRAZOLE 20 MG PO CPDR: 20.0000 mg | DELAYED_RELEASE_CAPSULE | Freq: Every day | ORAL | 1 refills | Status: DC | PRN

## 2017-08-22 MED ORDER — PRAVASTATIN SODIUM 40 MG PO TABS: 40.0000 mg | ORAL_TABLET | Freq: Every day | ORAL | 1 refills | Status: DC

## 2017-08-22 MED ORDER — HYDROCODONE-ACETAMINOPHEN 5-325 MG PO TABS: 1.0000 | ORAL_TABLET | Freq: Two times a day (BID) | ORAL | 0 refills | Status: DC | PRN

## 2017-08-22 MED ORDER — LISINOPRIL 40 MG PO TABS: ORAL_TABLET | ORAL | 1 refills | Status: DC

## 2017-08-22 MED ORDER — AMLODIPINE BESYLATE 5 MG PO TABS: 5.0000 mg | ORAL_TABLET | Freq: Every day | ORAL | 1 refills | Status: DC

## 2017-08-22 MED ORDER — HYDROCODONE-ACETAMINOPHEN 5-325 MG PO TABS: 1.0000 | ORAL_TABLET | Freq: Every day | ORAL | 0 refills | Status: DC | PRN

## 2017-08-22 MED ORDER — ALENDRONATE SODIUM 35 MG PO TABS: ORAL_TABLET | ORAL | 0 refills | Status: DC

## 2017-08-22 NOTE — Progress Notes (Signed)
Subjective:   Patient ID: Samantha ChampagneCarolyn F Bartles, female    DOB: 1945/07/20, 72 y.o.   MRN: [id number] CC: Medical Management of Chronic Issues  HPI: Samantha ChampagneCarolyn F Hizer is a 72 y.o. female   Indication for chronic opioid: arthritis.  Primarily bothered with pain in her right wrist.  She needs to work to be able to lift 50 pounds for the job that she is.  She is been seen by orthopedics, there is some separation of wrist.  Recommend that she be seen by a surgeon to see if there is a surgical option treatment. Medication and dose: Hydrocodone 5 mg # pills per month: #45  Last UDS date: 10/18 Opioid Treatment Agreement signed (Y/N): y  Opioid Treatment Agreement last reviewed with patient:   yes NCCSRS reviewed this encounter (include red flags):   reviewed, no red flags  Hypertension: Taking medicines regularly.  No shortness of breath or chest pain  Tobacco use: Trying to cut back. Using nicotine gum  Relevant past medical, surgical, family and social history reviewed. Allergies and medications reviewed and updated. Social History   Tobacco Use  Smoking Status Current Every Day Smoker  . Packs/day: 0.00  . Years: 52.00  . Pack years: 0.00  . Types: Cigarettes  Smokeless Tobacco Never Used  Tobacco Comment   smokes 8 cig daily   ROS: Per HPI   Objective:    BP 131/78   Pulse 97   Temp 98.1 F (36.7 C) (Oral)   Ht 5\' 6"  (1.676 m)   Wt 127 lb (57.6 kg)   BMI 20.50 kg/m   Wt Readings from Last 3 Encounters:  08/22/17 127 lb (57.6 kg)  07/31/17 126 lb (57.2 kg)  07/31/17 126 lb 6.4 oz (57.3 kg)   Gen: NAD, alert, cooperative with exam, NCAT EYES: EOMI, no conjunctival injection, or no icterus ENT:  TMs pearly gray b/l, OP without erythema LYMPH: no cervical LAD CV: NRRR, normal S1/S2, no murmur, distal pulses 2+ b/l Resp: CTABL, no wheezes, normal WOB Abd: +BS, soft, NTND. Ext: No edema, warm Neuro: Alert and oriented  Assessment & Plan:  [name] was seen today for  medical management of chronic issues.  Diagnoses and all orders for this visit:  Osteoporosis, unspecified osteoporosis type, unspecified pathological fracture presence Stable, cont below -     alendronate (FOSAMAX) 35 MG tablet; Take with a full glass of water on an empty stomach.  Essential hypertension Stable, cont below -     amLODipine (NORVASC) 5 MG tablet; Take 1 tablet (5 mg total) by mouth daily. -     lisinopril (PRINIVIL,ZESTRIL) 40 MG tablet; TAKE ONE (1) TABLET EACH DAY  Chronic back pain, unspecified back location, unspecified back pain laterality Stable, continue below. Use only when needed. Does help improve her ability to do ADLs, primarily work where she does have to do some lifting -     HYDROcodone-acetaminophen (NORCO) 5-325 MG tablet; Take 1 tablet by mouth 2 (two) times daily as needed for moderate pain. -     HYDROcodone-acetaminophen (NORCO/VICODIN) 5-325 MG tablet; Take 1 tablet by mouth daily as needed for moderate pain. -     HYDROcodone-acetaminophen (NORCO/VICODIN) 5-325 MG tablet; Take 1 tablet by mouth 2 (two) times daily as needed for moderate pain.  Gastroesophageal reflux disease without esophagitis Stable, cont below -     omeprazole (PRILOSEC) 20 MG capsule; Take 1 capsule (20 mg total) by mouth daily as needed.  Hyperlipidemia, unspecified hyperlipidemia type Stable, cont  below -     pravastatin (PRAVACHOL) 40 MG tablet; Take 1 tablet (40 mg total) by mouth daily.   Follow up plan: Return in about 3 months (around 11/22/2017). Rex Kras, MD [place] [facility]

## 2017-08-29 ENCOUNTER — Ambulatory Visit: Payer: Medicare Other | Admitting: Pediatrics

## 2017-09-15 ENCOUNTER — Other Ambulatory Visit (HOSPITAL_COMMUNITY): Payer: Medicare Other

## 2017-09-25 DIAGNOSIS — M25531 Pain in right wrist: Secondary | ICD-10-CM | POA: Diagnosis not present

## 2017-09-25 DIAGNOSIS — S63391A Traumatic rupture of other ligament of right wrist, initial encounter: Secondary | ICD-10-CM | POA: Diagnosis not present

## 2017-10-06 DIAGNOSIS — Z1231 Encounter for screening mammogram for malignant neoplasm of breast: Secondary | ICD-10-CM | POA: Diagnosis not present

## 2017-10-06 LAB — HM MAMMOGRAPHY

## 2017-10-08 NOTE — Patient Instructions (Signed)
Samantha ChampagneCarolyn F Huerta  10/08/2017     @PREFPERIOPPHARMACY @   Your procedure is scheduled on  10/20/2017 .  [facility] at  [facility]   A.M.  Call this number if you have problems the morning of surgery:  [phone]   Remember:  Do not eat or drink after midnight.  You may drink clear liquids until (follow the instructions given to you) .  Clear liquids allowed are:                    Water, Juice (non-citric and without pulp), Carbonated beverages, Clear Tea, Black Coffee only, Plain Jell-O only, Gatorade and Plain Popsicles only    Take these medicines the morning of surgery with A SIP OF WATER  Amlodipine, hydrocodone( if needed), lisinopril, prilosec. Use your inhaler before you come.    Do not wear jewelry, make-up or nail polish.  Do not wear lotions, powders, or perfumes, or deodorant.  Do not shave 48 hours prior to surgery.  Men may shave face and neck.  Do not bring valuables to the hospital.  [facility] any belongings or valuables.  Contacts, dentures or bridgework may not be worn into surgery.  Leave your suitcase in the car.  After surgery it may be brought to your room.  For patients admitted to the hospital, discharge time will be determined by your treatment team.  Patients discharged the day of surgery will not be allowed to drive home.   Name and phone number of your driver:   family Special instructions:  Follow the diet and prep instructions given to you by Dr Luvenia Starchourk's office.  Please read over the following fact sheets that you were given. Anesthesia Post-op Instructions and Care and Recovery After Surgery       Colonoscopy, Adult A colonoscopy is an exam to look at the large intestine. It is done to check for problems, such as:  Lumps (tumors).  Growths (polyps).  Swelling (inflammation).  Bleeding.  What happens before the procedure? Eating and drinking Follow instructions from your doctor about eating  and drinking. These instructions may include:  A few days before the procedure - follow a low-fiber diet. ? Avoid nuts. ? Avoid seeds. ? Avoid dried fruit. ? Avoid raw fruits. ? Avoid vegetables.  1-3 days before the procedure - follow a clear liquid diet. Avoid liquids that have red or purple dye. Drink only clear liquids, such as: ? Clear broth or bouillon. ? Black coffee or tea. ? Clear juice. ? Clear soft drinks or sports drinks. ? Gelatin dessert. ? Popsicles.  On the day of the procedure - do not eat or drink anything during the 2 hours before the procedure.  Bowel prep If you were prescribed an oral bowel prep:  Take it as told by your doctor. Starting the day before your procedure, you will need to drink a lot of liquid. The liquid will cause you to poop (have bowel movements) until your poop is almost clear or light green.  If your skin or butt gets irritated from diarrhea, you may: ? Wipe the area with wipes that have medicine in them, such as adult wet wipes with aloe and vitamin E. ? Put something on your skin that soothes the area, such as petroleum jelly.  If you throw up (vomit) while drinking the bowel prep, take a break for up to 60 minutes. Then begin  the bowel prep again. If you keep throwing up and you cannot take the bowel prep without throwing up, call your doctor.  General instructions  Ask your doctor about changing or stopping your normal medicines. This is important if you take diabetes medicines or blood thinners.  Plan to have someone take you home from the hospital or clinic. What happens during the procedure?  An IV tube may be put into one of your veins.  You will be given medicine to help you relax (sedative).  To reduce your risk of infection: ? Your doctors will wash their hands. ? Your anal area will be washed with soap.  You will be asked to lie on your side with your knees bent.  Your doctor will get a long, thin, flexible tube  ready. The tube will have a camera and a light on the end.  The tube will be put into your anus.  The tube will be gently put into your large intestine.  Air will be delivered into your large intestine to keep it open. You may feel some pressure or cramping.  The camera will be used to take photos.  A small tissue sample may be removed from your body to be looked at under a microscope (biopsy). If any possible problems are found, the tissue will be sent to a lab for testing.  If small growths are found, your doctor may remove them and have them checked for cancer.  The tube that was put into your anus will be slowly removed. The procedure may vary among doctors and hospitals. What happens after the procedure?  Your doctor will check on you often until the medicines you were given have worn off.  Do not drive for 24 hours after the procedure.  You may have a small amount of blood in your poop.  You may pass gas.  You may have mild cramps or bloating in your belly (abdomen).  It is up to you to get the results of your procedure. Ask your doctor, or the department performing the procedure, when your results will be ready. This information is not intended to replace advice given to you by your health care provider. Make sure you discuss any questions you have with your health care provider. Document Released: 03/30/2010 Document Revised: 12/27/2015 Document Reviewed: 05/09/2015 Elsevier Interactive Patient Education  2017 [employer]  Colonoscopy, Adult, Care After This sheet gives you information about how to care for yourself after your procedure. Your health care provider may also give you more specific instructions. If you have problems or questions, contact your health care provider. What can I expect after the procedure? After the procedure, it is common to have:  A small amount of blood in your stool for 24 hours after the procedure.  Some gas.  Mild abdominal cramping  or bloating.  Follow these instructions at home: General instructions   For the first 24 hours after the procedure: ? Do not drive or use machinery. ? Do not sign important documents. ? Do not drink alcohol. ? Do your regular daily activities at a slower pace than normal. ? Eat soft, easy-to-digest foods. ? Rest often.  Take over-the-counter or prescription medicines only as told by your health care provider.  It is up to you to get the results of your procedure. Ask your health care provider, or the department performing the procedure, when your results will be ready. Relieving cramping and bloating  Try walking around when you have cramps or  feel bloated.  Apply heat to your abdomen as told by your health care provider. Use a heat source that your health care provider recommends, such as a moist heat pack or a heating pad. ? Place a towel between your skin and the heat source. ? Leave the heat on for 20-30 minutes. ? Remove the heat if your skin turns bright red. This is especially important if you are unable to feel pain, heat, or cold. You may have a greater risk of getting burned. Eating and drinking  Drink enough fluid to keep your urine clear or pale yellow.  Resume your normal diet as instructed by your health care provider. Avoid heavy or fried foods that are hard to digest.  Avoid drinking alcohol for as long as instructed by your health care provider. Contact a health care provider if:  You have blood in your stool 2-3 days after the procedure. Get help right away if:  You have more than a small spotting of blood in your stool.  You pass large blood clots in your stool.  Your abdomen is swollen.  You have nausea or vomiting.  You have a fever.  You have increasing abdominal pain that is not relieved with medicine. This information is not intended to replace advice given to you by your health care provider. Make sure you discuss any questions you have with  your health care provider. Document Released: 10/10/2003 Document Revised: 11/20/2015 Document Reviewed: 05/09/2015 Elsevier Interactive Patient Education  2018 [place] Anesthesia is a term that refers to techniques, procedures, and medicines that help a person stay safe and comfortable during a medical procedure. Monitored anesthesia care, or sedation, is one type of anesthesia. Your anesthesia specialist may recommend sedation if you will be having a procedure that does not require you to be unconscious, such as:  Cataract surgery.  A dental procedure.  A biopsy.  A colonoscopy.  During the procedure, you may receive a medicine to help you relax (sedative). There are three levels of sedation:  Mild sedation. At this level, you may feel awake and relaxed. You will be able to follow directions.  Moderate sedation. At this level, you will be sleepy. You may not remember the procedure.  Deep sedation. At this level, you will be asleep. You will not remember the procedure.  The more medicine you are given, the deeper your level of sedation will be. Depending on how you respond to the procedure, the anesthesia specialist may change your level of sedation or the type of anesthesia to fit your needs. An anesthesia specialist will monitor you closely during the procedure. Let your health care provider know about:  Any allergies you have.  All medicines you are taking, including vitamins, herbs, eye drops, creams, and over-the-counter medicines.  Any use of steroids (by mouth or as a cream).  Any problems you or family members have had with sedatives and anesthetic medicines.  Any blood disorders you have.  Any surgeries you have had.  Any medical conditions you have, such as sleep apnea.  Whether you are pregnant or may be pregnant.  Any use of cigarettes, alcohol, or street drugs. What are the risks? Generally, this is a safe procedure. However,  problems may occur, including:  Getting too much medicine (oversedation).  Nausea.  Allergic reaction to medicines.  Trouble breathing. If this happens, a breathing tube may be used to help with breathing. It will be removed when you are awake and breathing  on your own.  Heart trouble.  Lung trouble.  Before the procedure Staying hydrated Follow instructions from your health care provider about hydration, which may include:  Up to 2 hours before the procedure - you may continue to drink clear liquids, such as water, clear fruit juice, black coffee, and plain tea.  Eating and drinking restrictions Follow instructions from your health care provider about eating and drinking, which may include:  8 hours before the procedure - stop eating heavy meals or foods such as meat, fried foods, or fatty foods.  6 hours before the procedure - stop eating light meals or foods, such as toast or cereal.  6 hours before the procedure - stop drinking milk or drinks that contain milk.  2 hours before the procedure - stop drinking clear liquids.  Medicines Ask your health care provider about:  Changing or stopping your regular medicines. This is especially important if you are taking diabetes medicines or blood thinners.  Taking medicines such as aspirin and ibuprofen. These medicines can thin your blood. Do not take these medicines before your procedure if your health care provider instructs you not to.  Tests and exams  You will have a physical exam.  You may have blood tests done to show: ? How well your kidneys and liver are working. ? How well your blood can clot.  General instructions  Plan to have someone take you home from the hospital or clinic.  If you will be going home right after the procedure, plan to have someone with you for 24 hours.  What happens during the procedure?  Your blood pressure, heart rate, breathing, level of pain and overall condition will be  monitored.  An IV tube will be inserted into one of your veins.  Your anesthesia specialist will give you medicines as needed to keep you comfortable during the procedure. This may mean changing the level of sedation.  The procedure will be performed. After the procedure  Your blood pressure, heart rate, breathing rate, and blood oxygen level will be monitored until the medicines you were given have worn off.  Do not drive for 24 hours if you received a sedative.  You may: ? Feel sleepy, clumsy, or nauseous. ? Feel forgetful about what happened after the procedure. ? Have a sore throat if you had a breathing tube during the procedure. ? Vomit. This information is not intended to replace advice given to you by your health care provider. Make sure you discuss any questions you have with your health care provider. Document Released: 11/21/2004 Document Revised: 08/04/2015 Document Reviewed: 06/18/2015 Elsevier Interactive Patient Education  2018 [place], Care After These instructions provide you with information about caring for yourself after your procedure. Your health care provider may also give you more specific instructions. Your treatment has been planned according to current medical practices, but problems sometimes occur. Call your health care provider if you have any problems or questions after your procedure. What can I expect after the procedure? After your procedure, it is common to:  Feel sleepy for several hours.  Feel clumsy and have poor balance for several hours.  Feel forgetful about what happened after the procedure.  Have poor judgment for several hours.  Feel nauseous or vomit.  Have a sore throat if you had a breathing tube during the procedure.  Follow these instructions at home: For at least 24 hours after the procedure:   Do not: ? Participate in activities in  which you could fall or become injured. ? Drive. ? Use  heavy machinery. ? Drink alcohol. ? Take sleeping pills or medicines that cause drowsiness. ? Make important decisions or sign legal documents. ? Take care of children on your own.  Rest. Eating and drinking  Follow the diet that is recommended by your health care provider.  If you vomit, drink water, juice, or soup when you can drink without vomiting.  Make sure you have little or no nausea before eating solid foods. General instructions  Have a responsible adult stay with you until you are awake and alert.  Take over-the-counter and prescription medicines only as told by your health care provider.  If you smoke, do not smoke without supervision.  Keep all follow-up visits as told by your health care provider. This is important. Contact a health care provider if:  You keep feeling nauseous or you keep vomiting.  You feel light-headed.  You develop a rash.  You have a fever. Get help right away if:  You have trouble breathing. This information is not intended to replace advice given to you by your health care provider. Make sure you discuss any questions you have with your health care provider. Document Released: 06/18/2015 Document Revised: 10/18/2015 Document Reviewed: 06/18/2015 Elsevier Interactive Patient Education  [employer].

## 2017-10-09 NOTE — Progress Notes (Signed)
134

## 2017-10-13 ENCOUNTER — Other Ambulatory Visit: Payer: Self-pay

## 2017-10-13 ENCOUNTER — Telehealth: Payer: Self-pay | Admitting: Internal Medicine

## 2017-10-13 ENCOUNTER — Encounter (HOSPITAL_COMMUNITY): Payer: Self-pay

## 2017-10-13 ENCOUNTER — Encounter (HOSPITAL_COMMUNITY)
Admission: RE | Admit: 2017-10-13 | Discharge: 2017-10-13 | Disposition: A | Discharge status: Home / Self Care | Payer: Medicare Other | Source: Ambulatory Visit | Attending: Internal Medicine | Admitting: Internal Medicine

## 2017-10-13 DIAGNOSIS — R9431 Abnormal electrocardiogram [ECG] [EKG]: Secondary | ICD-10-CM | POA: Insufficient documentation

## 2017-10-13 DIAGNOSIS — Z01812 Encounter for preprocedural laboratory examination: Secondary | ICD-10-CM | POA: Diagnosis not present

## 2017-10-13 DIAGNOSIS — I498 Other specified cardiac arrhythmias: Secondary | ICD-10-CM | POA: Insufficient documentation

## 2017-10-13 DIAGNOSIS — Z01818 Encounter for other preprocedural examination: Secondary | ICD-10-CM | POA: Diagnosis not present

## 2017-10-13 HISTORY — DX: Unspecified osteoarthritis, unspecified site: M19.90

## 2017-10-13 HISTORY — DX: Chronic obstructive pulmonary disease, unspecified: J44.9

## 2017-10-13 LAB — BASIC METABOLIC PANEL
Anion gap: 10 (ref 5–15)
BUN: 10 mg/dL (ref 8–23)
CALCIUM: 9.4 mg/dL (ref 8.9–10.3)
CO2: 22 mmol/L (ref 22–32)
CREATININE: 0.58 mg/dL (ref 0.44–1.00)
Chloride: 98 mmol/L (ref 98–111)
GFR calc Af Amer: >60 mL/min (ref >60)
GLUCOSE: 104 mg/dL — AB (ref 70–99)
Potassium: 4.5 mmol/L (ref 3.5–5.1)
Sodium: 130 mmol/L — ABNORMAL LOW (ref 135–145)

## 2017-10-13 LAB — CBC
HCT: 37.7 % (ref 36.0–46.0)
Hemoglobin: 13 g/dL (ref 12.0–15.0)
MCH: 31.9 pg (ref 26.0–34.0)
MCHC: 34.5 g/dL (ref 30.0–36.0)
MCV: 92.4 fL (ref 78.0–100.0)
PLATELETS: 436 10*3/uL — AB (ref 150–400)
RBC: 4.08 MIL/uL (ref 3.87–5.11)
RDW: 12.7 % (ref 11.5–15.5)
WBC: 7.5 10*3/uL (ref 4.0–10.5)

## 2017-10-13 MED ORDER — PEG 3350-KCL-NA BICARB-NACL 420 G PO SOLR: 4000.0000 mL | Freq: Once | ORAL | 0 refills | Status: AC

## 2017-10-13 NOTE — Telephone Encounter (Signed)
Patient called. She did not pick up rx for tylite when she was seen in office. New rx sent in

## 2017-10-13 NOTE — Telephone Encounter (Signed)
Pt called to let us know that she went to her pre op and she is needing her prep called into [facility] in [place].

## 2017-10-20 ENCOUNTER — Ambulatory Visit (HOSPITAL_COMMUNITY): Payer: Medicare Other | Admitting: Certified Registered"

## 2017-10-20 ENCOUNTER — Encounter (HOSPITAL_COMMUNITY): Payer: Self-pay | Admitting: Certified Registered"

## 2017-10-20 ENCOUNTER — Encounter (HOSPITAL_COMMUNITY)
Admission: RE | Disposition: A | Discharge status: Home / Self Care | Payer: Self-pay | Source: Ambulatory Visit | Attending: Internal Medicine

## 2017-10-20 ENCOUNTER — Ambulatory Visit (HOSPITAL_COMMUNITY)
Admission: RE | Admit: 2017-10-20 | Discharge: 2017-10-20 | Disposition: A | Discharge status: Home / Self Care | Payer: Medicare Other | Source: Ambulatory Visit | Attending: Internal Medicine | Admitting: Internal Medicine

## 2017-10-20 DIAGNOSIS — K219 Gastro-esophageal reflux disease without esophagitis: Secondary | ICD-10-CM | POA: Diagnosis not present

## 2017-10-20 DIAGNOSIS — I10 Essential (primary) hypertension: Secondary | ICD-10-CM | POA: Insufficient documentation

## 2017-10-20 DIAGNOSIS — M8580 Other specified disorders of bone density and structure, unspecified site: Secondary | ICD-10-CM | POA: Diagnosis not present

## 2017-10-20 DIAGNOSIS — J449 Chronic obstructive pulmonary disease, unspecified: Secondary | ICD-10-CM | POA: Insufficient documentation

## 2017-10-20 DIAGNOSIS — Z7951 Long term (current) use of inhaled steroids: Secondary | ICD-10-CM | POA: Insufficient documentation

## 2017-10-20 DIAGNOSIS — E785 Hyperlipidemia, unspecified: Secondary | ICD-10-CM | POA: Diagnosis not present

## 2017-10-20 DIAGNOSIS — F17210 Nicotine dependence, cigarettes, uncomplicated: Secondary | ICD-10-CM | POA: Insufficient documentation

## 2017-10-20 DIAGNOSIS — Z79891 Long term (current) use of opiate analgesic: Secondary | ICD-10-CM | POA: Insufficient documentation

## 2017-10-20 DIAGNOSIS — R195 Other fecal abnormalities: Secondary | ICD-10-CM | POA: Diagnosis not present

## 2017-10-20 DIAGNOSIS — H409 Unspecified glaucoma: Secondary | ICD-10-CM | POA: Diagnosis not present

## 2017-10-20 DIAGNOSIS — K5730 Diverticulosis of large intestine without perforation or abscess without bleeding: Secondary | ICD-10-CM | POA: Diagnosis not present

## 2017-10-20 DIAGNOSIS — Z7982 Long term (current) use of aspirin: Secondary | ICD-10-CM | POA: Insufficient documentation

## 2017-10-20 DIAGNOSIS — K640 First degree hemorrhoids: Secondary | ICD-10-CM | POA: Insufficient documentation

## 2017-10-20 DIAGNOSIS — Z79899 Other long term (current) drug therapy: Secondary | ICD-10-CM | POA: Diagnosis not present

## 2017-10-20 HISTORY — PX: COLONOSCOPY WITH PROPOFOL: SHX5780

## 2017-10-20 LAB — HEMOGLOBIN AND HEMATOCRIT, BLOOD
HCT: 35.3 % — ABNORMAL LOW (ref 36.0–46.0)
Hemoglobin: 12 g/dL (ref 12.0–15.0)

## 2017-10-20 SURGERY — COLONOSCOPY WITH PROPOFOL
Anesthesia: Monitor Anesthesia Care

## 2017-10-20 MED ORDER — LIDOCAINE HCL (PF) 1 % IJ SOLN
INTRAMUSCULAR | Status: AC
Start: 2017-10-20 — End: ?
  Filled 2017-10-20: qty 5

## 2017-10-20 MED ORDER — LACTATED RINGERS IV SOLN
INTRAVENOUS | Status: DC
  Administered 2017-10-20: 08:00:00 via INTRAVENOUS

## 2017-10-20 MED ORDER — PROPOFOL 10 MG/ML IV BOLUS
INTRAVENOUS | Status: AC
  Filled 2017-10-20: qty 20

## 2017-10-20 MED ORDER — CHLORHEXIDINE GLUCONATE CLOTH 2 % EX PADS: 6.0000 | MEDICATED_PAD | Freq: Once | CUTANEOUS | Status: DC

## 2017-10-20 MED ORDER — LIDOCAINE HCL (PF) 1 % IJ SOLN
INTRAMUSCULAR | Status: DC | PRN
  Administered 2017-10-20: 20 mg

## 2017-10-20 MED ORDER — PROPOFOL 10 MG/ML IV BOLUS
INTRAVENOUS | Status: DC | PRN
  Administered 2017-10-20 (×2): 100 mg via INTRAVENOUS
  Administered 2017-10-20: 50 mg via INTRAVENOUS

## 2017-10-20 NOTE — Transfer of Care (Signed)
Immediate Anesthesia Transfer of Care Note  Patient: Samantha Huerta  Procedure(s) Performed: COLONOSCOPY WITH PROPOFOL (N/A )  Patient Location: PACU  Anesthesia Type:MAC  Level of Consciousness: awake, alert , oriented and patient cooperative  Airway & Oxygen Therapy: Patient Spontanous Breathing  Post-op Assessment: Report given to RN and Post -op Vital signs reviewed and stable  Post vital signs: Reviewed and stable  Last Vitals:  Vitals Value Taken Time  BP 117/63 10/20/2017  8:52 AM  Temp    Pulse 80 10/20/2017  8:53 AM  Resp 22 10/20/2017  8:53 AM  SpO2 97 % 10/20/2017  8:53 AM  Vitals shown include unvalidated device data.  Last Pain:  Vitals:   10/20/17 0805  TempSrc: Oral      Patients Stated Pain Goal: 8 (00/17/49 4496)  Complications: No apparent anesthesia complications

## 2017-10-20 NOTE — Anesthesia Preprocedure Evaluation (Signed)
Anesthesia Evaluation  Patient identified by MRN, date of birth, ID band Patient awake    Reviewed: Allergy & Precautions, H&P , NPO status , Patient's Chart, lab work & pertinent test results  Airway Mallampati: II  TM Distance: >3 FB Neck ROM: full    Dental no notable dental hx. (+) Edentulous Upper   Pulmonary neg pulmonary ROS, COPD, Current Smoker,    Pulmonary exam normal breath sounds clear to auscultation       Cardiovascular Exercise Tolerance: Good hypertension, negative cardio ROS   Rhythm:regular Rate:Normal     Neuro/Psych Anxiety negative neurological ROS  negative psych ROS   GI/Hepatic negative GI ROS, Neg liver ROS, GERD  ,  Endo/Other  negative endocrine ROS  Renal/GU negative Renal ROS  negative genitourinary   Musculoskeletal   Abdominal   Peds  Hematology negative hematology ROS (+)   Anesthesia Other Findings   Reproductive/Obstetrics negative OB ROS                             Anesthesia Physical Anesthesia Plan  ASA: III  Anesthesia Plan: MAC   Post-op Pain Management:    Induction:   PONV Risk Score and Plan:   Airway Management Planned:   Additional Equipment:   Intra-op Plan:   Post-operative Plan:   Informed Consent: I have reviewed the patients History and Physical, chart, labs and discussed the procedure including the risks, benefits and alternatives for the proposed anesthesia with the patient or authorized representative who has indicated his/her understanding and acceptance.     Plan Discussed with: CRNA  Anesthesia Plan Comments:         Anesthesia Quick Evaluation

## 2017-10-20 NOTE — Anesthesia Postprocedure Evaluation (Signed)
Anesthesia Post Note  Patient: Samantha Huerta  Procedure(s) Performed: COLONOSCOPY WITH PROPOFOL (N/A )  Patient location during evaluation: PACU Anesthesia Type: MAC Level of consciousness: awake, awake and alert, oriented and patient cooperative Pain management: pain level controlled Vital Signs Assessment: post-procedure vital signs reviewed and stable Respiratory status: spontaneous breathing and respiratory function stable Cardiovascular status: blood pressure returned to baseline and stable Postop Assessment: no headache, no backache and no apparent nausea or vomiting Anesthetic complications: no     Last Vitals:  Vitals:   10/20/17 0805 10/20/17 0853  BP: (!) 152/84   Pulse: 92   Resp: (!) 24   Temp: 36.4 C 37.1 C  SpO2: 99% 98%    Last Pain:  Vitals:   10/20/17 0853  TempSrc:   PainSc: 0-No pain                 Draycen Leichter Terri Piedra

## 2017-10-20 NOTE — Op Note (Signed)
[facility] Patient Name: Samantha SalterCarolyn Turay Procedure Date: 10/20/2017 7:55 AM MRN: [id number] Date of Birth: Oct 10, 1945 Attending MD: Gennette Pacobert Michael Emelina Hinch , MD CSN: [id number] Age: 72 Admit Type: Outpatient Procedure:                Colonoscopy Indications:              Heme positive stool Providers:                Gennette Pacobert Michael Tanyla Stege, MD, Jannett CelestineAnitra Bell, RN, Dyann Ruddleonya                            Wilson Referring MD:              Medicines:                Propofol per Anesthesia Complications:            No immediate complications. Estimated Blood Loss:     Estimated blood loss: none. Procedure:                Pre-Anesthesia Assessment:                           - Prior to the procedure, a History and Physical                            was performed, and patient medications and                            allergies were reviewed. The patient's tolerance of                            previous anesthesia was also reviewed. The risks                            and benefits of the procedure and the sedation                            options and risks were discussed with the patient.                            All questions were answered, and informed consent                            was obtained. Prior Anticoagulants: The patient has                            taken no previous anticoagulant or antiplatelet                            agents. ASA Grade Assessment: II - A patient with                            mild systemic disease. After reviewing the risks  and benefits, the patient was deemed in                            satisfactory condition to undergo the procedure.                           After obtaining informed consent, the colonoscope                            was passed under direct vision. Throughout the                            procedure, the patient's blood pressure, pulse, and                            oxygen saturations were monitored  continuously. The                            CF-[id number] ([id number] scope was introduced through                            the and advanced to the 3 cm into the ileum. The                            colonoscopy was performed without difficulty. The                            patient tolerated the procedure well. The quality                            of the bowel preparation was adequate. The terminal                            ileum, ileocecal valve, appendiceal orifice, and                            rectum were photographed. The entire colon was well                            visualized. Scope In: 8:30:19 AM Scope Out: 8:46:06 AM Scope Withdrawal Time: 0 hours 6 minutes 5 seconds  Total Procedure Duration: 0 hours 15 minutes 47 seconds  Findings:      The perianal and digital rectal examinations were normal.      Non-bleeding internal hemorrhoids were found during retroflexion. The       hemorrhoids were Grade I (internal hemorrhoids that do not prolapse).      Multiple medium-mouthed diverticula were found in the entire colon.       There was no evidence of diverticular bleeding.      The exam was otherwise without abnormality on direct and retroflexion       views. Distal 3 cm of terminal ileum appeared normal. Impression:               - Non-bleeding internal hemorrhoids.                           -  Diverticulosis in the entire examined colon.                            There was no evidence of diverticular bleeding.                           - The examination was otherwise normal on direct                            and retroflexion views.                           - No specimens collected. Moderate Sedation:      Moderate (conscious) sedation was personally administered by an       anesthesia professional. The following parameters were monitored: oxygen       saturation, heart rate, blood pressure, respiratory rate, EKG, adequacy       of pulmonary ventilation, and response to  care. Total physician       intraservice time was 18 minutes. Recommendation:           - Patient has a contact number available for                            emergencies. The signs and symptoms of potential                            delayed complications were discussed with the                            patient. Return to normal activities tomorrow.                            Written discharge instructions were provided to the                            patient.                           - Resume previous diet. H&H today. No future                            colonoscopy unless symptoms develop. Assuming H&H                            is okay, no further GI evaluation unless patient                            displays signs/symptoms of GI bleeding.                           - Continue present medications.                           - Return to GI office PRN. Procedure Code(s):        --- Professional ---  69629, Colonoscopy, flexible; diagnostic, including                            collection of specimen(s) by brushing or washing,                            when performed (separate procedure) Diagnosis Code(s):        --- Professional ---                           K64.0, First degree hemorrhoids                           R19.5, Other fecal abnormalities                           K57.30, Diverticulosis of large intestine without                            perforation or abscess without bleeding CPT copyright 2017 American Medical Association. All rights reserved. The codes documented in this report are preliminary and upon coder review may  be revised to meet current compliance requirements. Gerrit Friends. Edrie Ehrich, MD Gennette Pac, MD 10/20/2017 8:53:03 AM This report has been signed electronically. Number of Addenda: 0

## 2017-10-20 NOTE — Discharge Instructions (Signed)
Hemorrhoids °Hemorrhoids are swollen veins in and around the rectum or anus. There are two types of hemorrhoids: °· Internal hemorrhoids. These occur in the veins that are just inside the rectum. They may poke through to the outside and become irritated and painful. °· External hemorrhoids. These occur in the veins that are outside of the anus and can be felt as a painful swelling or hard lump near the anus. ° °Most hemorrhoids do not cause serious problems, and they can be managed with home treatments such as diet and lifestyle changes. If home treatments do not help your symptoms, procedures can be done to shrink or remove the hemorrhoids. °What are the causes? °This condition is caused by increased pressure in the anal area. This pressure may result from various things, including: °· Constipation. °· Straining to have a bowel movement. °· Diarrhea. °· Pregnancy. °· Obesity. °· Sitting for long periods of time. °· Heavy lifting or other activity that causes you to strain. °· Anal sex. ° °What are the signs or symptoms? °Symptoms of this condition include: °· Pain. °· Anal itching or irritation. °· Rectal bleeding. °· Leakage of stool (feces). °· Anal swelling. °· One or more lumps around the anus. ° °How is this diagnosed? °This condition can often be diagnosed through a visual exam. Other exams or tests may also be done, such as: °· Examination of the rectal area with a gloved hand (digital rectal exam). °· Examination of the anal canal using a small tube (anoscope). °· A blood test, if you have lost a significant amount of blood. °· A test to look inside the colon (sigmoidoscopy or colonoscopy). ° °How is this treated? °This condition can usually be treated at home. However, various procedures may be done if dietary changes, lifestyle changes, and other home treatments do not help your symptoms. These procedures can help make the hemorrhoids smaller or remove them completely. Some of these procedures involve  surgery, and others do not. Common procedures include: °· Rubber band ligation. Rubber bands are placed at the base of the hemorrhoids to cut off the blood supply to them. °· Sclerotherapy. Medicine is injected into the hemorrhoids to shrink them. °· Infrared coagulation. A type of light energy is used to get rid of the hemorrhoids. °· Hemorrhoidectomy surgery. The hemorrhoids are surgically removed, and the veins that supply them are tied off. °· Stapled hemorrhoidopexy surgery. A circular stapling device is used to remove the hemorrhoids and use staples to cut off the blood supply to them. ° °Follow these instructions at home: °Eating and drinking °· Eat foods that have a lot of fiber in them, such as whole grains, beans, nuts, fruits, and vegetables. Ask your health care provider about taking products that have added fiber (fiber supplements). °· Drink enough fluid to keep your urine clear or pale yellow. °Managing pain and swelling °· Take warm sitz baths for 20 minutes, 3-4 times a day to ease pain and discomfort. °· If directed, apply ice to the affected area. Using ice packs between sitz baths may be helpful. °? Put ice in a plastic bag. °? Place a towel between your skin and the bag. °? Leave the ice on for 20 minutes, 2-3 times a day. °General instructions °· Take over-the-counter and prescription medicines only as told by your health care provider. °· Use medicated creams or suppositories as told. °· Exercise regularly. °· Go to the bathroom when you have the urge to have a bowel movement. Do not wait. °·   Avoid straining to have bowel movements.  Keep the anal area dry and clean. Use wet toilet paper or moist towelettes after a bowel movement.  Do not sit on the toilet for long periods of time. This increases blood pooling and pain. Contact a health care provider if:  You have increasing pain and swelling that are not controlled by treatment or medicine.  You have uncontrolled bleeding.  You  have difficulty having a bowel movement, or you are unable to have a bowel movement.  You have pain or inflammation outside the area of the hemorrhoids. This information is not intended to replace advice given to you by your health care provider. Make sure you discuss any questions you have with your health care provider. Document Released: 02/23/2000 Document Revised: 07/26/2015 Document Reviewed: 11/09/2014 Elsevier Interactive Patient Education  2018 [employer]. Diverticulosis Diverticulosis is a condition that develops when small pouches (diverticula) form in the wall of the large intestine (colon). The colon is where water is absorbed and stool is formed. The pouches form when the inside layer of the colon pushes through weak spots in the outer layers of the colon. You may have a few pouches or many of them. What are the causes? The cause of this condition is not known. What increases the risk? The following factors may make you more likely to develop this condition:  Being older than age 66. Your risk for this condition increases with age. Diverticulosis is rare among people younger than age 42. By age 50, many people have it.  Eating a low-fiber diet.  Having frequent constipation.  Being overweight.  Not getting enough exercise.  Smoking.  Taking over-the-counter pain medicines, like aspirin and ibuprofen.  Having a family history of diverticulosis.  What are the signs or symptoms? In most people, there are no symptoms of this condition. If you do have symptoms, they may include:  Bloating.  Cramps in the abdomen.  Constipation or diarrhea.  Pain in the lower left side of the abdomen.  How is this diagnosed? This condition is most often diagnosed during an exam for other colon problems. Because diverticulosis usually has no symptoms, it often cannot be diagnosed independently. This condition may be diagnosed by:  Using a flexible scope to examine the colon  (colonoscopy).  Taking an X-ray of the colon after dye has been put into the colon (barium enema).  Doing a CT scan.  How is this treated? You may not need treatment for this condition if you have never developed an infection related to diverticulosis. If you have had an infection before, treatment may include:  Eating a high-fiber diet. This may include eating more fruits, vegetables, and grains.  Taking a fiber supplement.  Taking a live bacteria supplement (probiotic).  Taking medicine to relax your colon.  Taking antibiotic medicines.  Follow these instructions at home:  Drink 6-8 glasses of water or more each day to prevent constipation.  Try not to strain when you have a bowel movement.  If you have had an infection before: ? Eat more fiber as directed by your health care provider or your diet and nutrition specialist (dietitian). ? Take a fiber supplement or probiotic, if your health care provider approves.  Take over-the-counter and prescription medicines only as told by your health care provider.  If you were prescribed an antibiotic, take it as told by your health care provider. Do not stop taking the antibiotic even if you start to feel better.  Keep all  follow-up visits as told by your health care provider. This is important. Contact a health care provider if:  You have pain in your abdomen.  You have bloating.  You have cramps.  You have not had a bowel movement in 3 days. Get help right away if:  Your pain gets worse.  Your bloating becomes very bad.  You have a fever or chills, and your symptoms suddenly get worse.  You vomit.  You have bowel movements that are bloody or black.  You have bleeding from your rectum. Summary  Diverticulosis is a condition that develops when small pouches (diverticula) form in the wall of the large intestine (colon).  You may have a few pouches or many of them.  This condition is most often diagnosed during an  exam for other colon problems.  If you have had an infection related to diverticulosis, treatment may include increasing the fiber in your diet, taking supplements, or taking medicines. This information is not intended to replace advice given to you by your health care provider. Make sure you discuss any questions you have with your health care provider. Document Released: 11/23/2003 Document Revised: 01/15/2016 Document Reviewed: 01/15/2016 Elsevier Interactive Patient Education  2017 [employer]  Colonoscopy Discharge Instructions  Read the instructions outlined below and refer to this sheet in the next few weeks. These discharge instructions provide you with general information on caring for yourself after you leave the hospital. Your doctor may also give you specific instructions. While your treatment has been planned according to the most current medical practices available, unavoidable complications occasionally occur. If you have any problems or questions after discharge, call Dr. Jena Gaussourk at [phone]. ACTIVITY  You may resume your regular activity, but move at a slower pace for the next 24 hours.   Take frequent rest periods for the next 24 hours.   Walking will help get rid of the air and reduce the bloated feeling in your belly (abdomen).   No driving for 24 hours (because of the medicine (anesthesia) used during the test).    Do not sign any important legal documents or operate any machinery for 24 hours (because of the anesthesia used during the test).  NUTRITION  Drink plenty of fluids.   You may resume your normal diet as instructed by your doctor.   Begin with a light meal and progress to your normal diet. Heavy or fried foods are harder to digest and may make you feel sick to your stomach (nauseated).   Avoid alcoholic beverages for 24 hours or as instructed.  MEDICATIONS  You may resume your normal medications unless your doctor tells you otherwise.  WHAT YOU CAN  EXPECT TODAY  Some feelings of bloating in the abdomen.   Passage of more gas than usual.   Spotting of blood in your stool or on the toilet paper.  IF YOU HAD POLYPS REMOVED DURING THE COLONOSCOPY:  No aspirin products for 7 days or as instructed.   No alcohol for 7 days or as instructed.   Eat a soft diet for the next 24 hours.  FINDING OUT THE RESULTS OF YOUR TEST Not all test results are available during your visit. If your test results are not back during the visit, make an appointment with your caregiver to find out the results. Do not assume everything is normal if you have not heard from your caregiver or the medical facility. It is important for you to follow up on all of your test results.  SEEK IMMEDIATE MEDICAL ATTENTION IF:  You have more than a spotting of blood in your stool.   Your belly is swollen (abdominal distention).   You are nauseated or vomiting.   You have a temperature over 101.   You have abdominal pain or discomfort that is severe or gets worse throughout the day.    Diverticulosis and hemorrhoid information provided  H&H today  I do not recommend a future colonoscopy unless new symptoms develop.

## 2017-10-20 NOTE — H&P (Signed)
 @LOGO @   Primary Care Physician:  Johna SheriffVincent, Carol L, MD Primary Gastroenterologist:  Dr. Jena Gaussourk  Pre-Procedure History & Physical: HPI:  Samantha Huerta is a 72 y.o. female here for further evaluation of Hemoccult-positive stool. No gross blood per rectum. No melena. No upper GI tract symptoms. Last colonoscopy negative reportedly 15 years ago. Denies constipation or diarrhea.  Past Medical History:  Diagnosis Date  . Anxiety   . Arthritis   . COPD (chronic obstructive pulmonary disease) (HCC)   . GERD (gastroesophageal reflux disease)   . Glaucoma   . Hyperlipidemia   . Hypertension   . Osteopenia     Past Surgical History:  Procedure Laterality Date  . ABDOMINAL HYSTERECTOMY    . CARPAL TUNNEL RELEASE Bilateral   . ELBOW SURGERY Right   . SHOULDER SURGERY Left     Prior to Admission medications   Medication Sig Start Date End Date Taking? Authorizing Provider  ADVAIR DISKUS 250-50 MCG/DOSE AEPB USE 1 INHALATION TWICE DAILY 06/09/17  Yes Johna SheriffVincent, Carol L, MD  amLODipine (NORVASC) 5 MG tablet Take 1 tablet (5 mg total) by mouth daily. 08/22/17  Yes Johna SheriffVincent, Carol L, MD  aspirin EC 81 MG tablet Take 81 mg by mouth daily.   Yes [provider]  brinzolamide (AZOPT) 1 % ophthalmic suspension Place 1 drop into both eyes 2 (two) times daily.   Yes [provider]  HYDROcodone-acetaminophen (NORCO) 5-325 MG tablet Take 1 tablet by mouth 2 (two) times daily as needed for moderate pain. 09/21/17 10/22/17 Yes Johna SheriffVincent, Carol L, MD  HYDROcodone-acetaminophen (NORCO/VICODIN) 5-325 MG tablet Take 1 tablet by mouth 2 (two) times daily as needed for moderate pain. 10/22/17 11/21/17 Yes Johna SheriffVincent, Carol L, MD  latanoprost (XALATAN) 0.005 % ophthalmic solution Place 1 drop into both eyes at bedtime.   Yes [provider]  lisinopril (PRINIVIL,ZESTRIL) 40 MG tablet TAKE ONE (1) TABLET EACH DAY 08/22/17  Yes Johna SheriffVincent, Carol L, MD  Multiple Vitamins-Minerals (MULTIVITAMIN WITH  MINERALS) tablet Take 1 tablet by mouth daily.   Yes [provider]  omeprazole (PRILOSEC) 20 MG capsule Take 1 capsule (20 mg total) by mouth daily as needed. 08/22/17  Yes Johna SheriffVincent, Carol L, MD  pravastatin (PRAVACHOL) 40 MG tablet Take 1 tablet (40 mg total) by mouth daily. 08/22/17  Yes Johna SheriffVincent, Carol L, MD    Allergies as of 07/31/2017  . (No Known Allergies)    Family History  Problem Relation Age of Onset  . Hip fracture Mother   . Cancer Sister        breast  . Glaucoma Sister   . Diabetes Sister   . Glaucoma Sister   . Glaucoma Sister   . Glaucoma Sister   . Other Brother        killed in MVA  . Colon cancer Neg Hx     Social History   Socioeconomic History  . Marital status: Divorced    Spouse name: Not on file  . Number of children: Not on file  . Years of education: Not on file  . Highest education level: Not on file  Occupational History  . Not on file  Social Needs  . Financial resource strain: Not on file  . Food insecurity:    Worry: Not on file    Inability: Not on file  . Transportation needs:    Medical: Not on file    Non-medical: Not on file  Tobacco Use  . Smoking status: Current Every Day  Smoker    Packs/day: 0.50    Years: 52.00    Pack years: 26.00    Types: Cigarettes  . Smokeless tobacco: Never Used  Substance and Sexual Activity  . Alcohol use: Yes    Comment: 2-3 times a week  . Drug use: No  . Sexual activity: Never    Birth control/protection: Surgical    Comment: hyst  Lifestyle  . Physical activity:    Days per week: Not on file    Minutes per session: Not on file  . Stress: Not on file  Relationships  . Social connections:    Talks on phone: Not on file    Gets together: Not on file    Attends religious service: Not on file    Active member of club or organization: Not on file    Attends meetings of clubs or organizations: Not on file    Relationship status: Not on file  . Intimate partner violence:    Fear  of current or ex partner: Not on file    Emotionally abused: Not on file    Physically abused: Not on file    Forced sexual activity: Not on file  Other Topics Concern  . Not on file  Social History Narrative  . Not on file    Review of Systems: See HPI, otherwise negative ROS  Physical Exam: There were no vitals taken for this visit. Nose:  No deformity, discharge,  or lesions. Mouth:  No deformity or lesions. Neck:  Supple; no masses or thyromegaly. No significant cervical adenopathy. Lungs:  Clear throughout to auscultation.   No wheezes, crackles, or rhonchi. No acute distress. Heart:  Regular rate and rhythm; no murmurs, clicks, rubs,  or gallops. Abdomen: Non-distended, normal bowel sounds.  Soft and nontender without appreciable mass or hepatosplenomegaly.  Pulses:  Normal pulses noted. Extremities:  Without clubbing or edema.  Impression/Plan:  Pleasant 72 year old lady here for further evaluation of Hemoccult-positive stool via colonoscopy. The risks, benefits, limitations, alternatives and imponderables have been reviewed with the patient. Questions have been answered. All parties are agreeable.     Notice: This dictation was prepared with Dragon dictation along with smaller phrase technology. Any transcriptional errors that result from this process are unintentional and may not be corrected upon review.

## 2017-10-23 ENCOUNTER — Encounter (HOSPITAL_COMMUNITY): Payer: Self-pay | Admitting: Internal Medicine

## 2017-11-07 DIAGNOSIS — H5203 Hypermetropia, bilateral: Secondary | ICD-10-CM | POA: Diagnosis not present

## 2017-11-07 DIAGNOSIS — H52223 Regular astigmatism, bilateral: Secondary | ICD-10-CM | POA: Diagnosis not present

## 2017-11-07 DIAGNOSIS — H524 Presbyopia: Secondary | ICD-10-CM | POA: Diagnosis not present

## 2017-12-11 ENCOUNTER — Ambulatory Visit: Payer: Medicare Other | Admitting: Pediatrics

## 2017-12-12 ENCOUNTER — Encounter: Payer: Self-pay | Admitting: Pediatrics

## 2017-12-12 ENCOUNTER — Ambulatory Visit (INDEPENDENT_AMBULATORY_CARE_PROVIDER_SITE_OTHER): Payer: Medicare Other | Admitting: Pediatrics

## 2017-12-12 VITALS — BP 134/81 | HR 99 | Temp 97.8°F | Ht 66.0 in | Wt 127.0 lb

## 2017-12-12 DIAGNOSIS — Z79899 Other long term (current) drug therapy: Secondary | ICD-10-CM | POA: Diagnosis not present

## 2017-12-12 DIAGNOSIS — E871 Hypo-osmolality and hyponatremia: Secondary | ICD-10-CM | POA: Diagnosis not present

## 2017-12-12 DIAGNOSIS — Z23 Encounter for immunization: Secondary | ICD-10-CM

## 2017-12-12 DIAGNOSIS — G8929 Other chronic pain: Secondary | ICD-10-CM

## 2017-12-12 DIAGNOSIS — M549 Dorsalgia, unspecified: Secondary | ICD-10-CM

## 2017-12-12 DIAGNOSIS — I10 Essential (primary) hypertension: Secondary | ICD-10-CM

## 2017-12-12 DIAGNOSIS — M19031 Primary osteoarthritis, right wrist: Secondary | ICD-10-CM

## 2017-12-12 MED ORDER — HYDROCODONE-ACETAMINOPHEN 5-325 MG PO TABS: 1.0000 | ORAL_TABLET | Freq: Two times a day (BID) | ORAL | 0 refills | Status: DC | PRN

## 2017-12-12 MED ORDER — AMLODIPINE BESYLATE 5 MG PO TABS: 5.0000 mg | ORAL_TABLET | Freq: Every day | ORAL | 1 refills | Status: DC

## 2017-12-12 MED ORDER — LISINOPRIL 40 MG PO TABS: ORAL_TABLET | ORAL | 1 refills | Status: DC

## 2017-12-12 NOTE — Progress Notes (Signed)
  Subjective:   Patient ID: Samantha Huerta, female    DOB: 1946-02-07, 72 y.o.   MRN: [id number] CC: Medical Management of Chronic Issues  HPI: Samantha Huerta is a 72 y.o. female   Overall has been doing well.  No fevers, appetite is been fine.  Indication for chronic opioid: arthritis R wrist and back pain Medication and dose: Hydrocodone 5/325 mg # pills per month: #45 Last UDS date: 12/2016, due Opioid Treatment Agreement signed (Y/N): Y Opioid Treatment Agreement last reviewed with patient:   today [place] reviewed this encounter (include red flags):   reviewed today, appropriate  Hyponatremia: Not currently on diuretics.  Has been slightly low in the past, improved then again low prior to colonoscopy.  Says she does not eat any salt in her diet.  Right wrist arthritis: Following with orthopedics.  Surgery recommended, not yet scheduled.  Will need CT scan repeat 04/2018 for follow-up lung nodules  Hypertension: Taking medicine regularly.  No lightheadedness or chest pain or dizziness.  Relevant past medical, surgical, family and social history reviewed. Allergies and medications reviewed and updated. Social History   Tobacco Use  Smoking Status Current Every Day Smoker  . Packs/day: 0.50  . Years: 52.00  . Pack years: 26.00  . Types: Cigarettes  Smokeless Tobacco Never Used   ROS: Per HPI   Objective:    BP 134/81   Pulse 99   Temp 97.8 F (36.6 C) (Oral)   Ht 5' 6" (1.676 m)   Wt 127 lb (57.6 kg)   BMI 20.50 kg/m   Wt Readings from Last 3 Encounters:  12/12/17 127 lb (57.6 kg)  10/13/17 125 lb (56.7 kg)  08/22/17 127 lb (57.6 kg)    Gen: NAD, alert, cooperative with exam, NCAT EYES: EOMI, no conjunctival injection, or no icterus CV: NRRR, normal S1/S2, no murmur, distal pulses 2+ b/l Resp: CTABL, no wheezes, normal WOB Ext: No edema, warm Neuro: Alert and oriented, strength equal b/l UE and LE, coordination grossly normal MSK: Right wrist with bulky  swelling.  No redness.  Assessment & Plan:  Julian was seen today for medical management of chronic issues.  Diagnoses and all orders for this visit:  Arthritis of right wrist Continue follow-up with orthopedics.  Essential hypertension Stable, continue below -     lisinopril (PRINIVIL,ZESTRIL) 40 MG tablet; TAKE ONE (1) TABLET EACH DAY -     amLODipine (NORVASC) 5 MG tablet; Take 1 tablet (5 mg total) by mouth daily.  Chronic back pain, unspecified back location, unspecified back pain laterality Stable, below helps improve functionality, is able to continue working.  Does help with the back pain after standing for periods of time. -     HYDROcodone-acetaminophen (NORCO) 5-325 MG tablet; Take 1 tablet by mouth 2 (two) times daily as needed for moderate pain. -     HYDROcodone-acetaminophen (NORCO) 5-325 MG tablet; Take 1 tablet by mouth 2 (two) times daily as needed for moderate pain. -     HYDROcodone-acetaminophen (NORCO/VICODIN) 5-325 MG tablet; Take 1 tablet by mouth 2 (two) times daily as needed for moderate pain.  Hyponatremia Will repeat BMP.  Okay to slightly liberalize salt intake. -     BMP8+EGFR  Controlled substance agreement signed -     ToxASSURE Select 13 (MW), Urine   Follow up plan: Return in about 3 months (around 03/14/2018). Assunta Found, MD [place]

## 2017-12-13 LAB — BMP8+EGFR
BUN/Creatinine Ratio: 20 (ref 12–28)
BUN: 11 mg/dL (ref 8–27)
CALCIUM: 9.4 mg/dL (ref 8.7–10.3)
CHLORIDE: 99 mmol/L (ref 96–106)
CO2: 21 mmol/L (ref 20–29)
Creatinine, Ser: 0.54 mg/dL — ABNORMAL LOW (ref 0.57–1.00)
GFR calc Af Amer: 108 mL/min/{1.73_m2} (ref >59)
GFR calc non Af Amer: 94 mL/min/{1.73_m2} (ref >59)
Glucose: 96 mg/dL (ref 65–99)
POTASSIUM: 4.4 mmol/L (ref 3.5–5.2)
SODIUM: 133 mmol/L — AB (ref 134–144)

## 2017-12-18 LAB — TOXASSURE SELECT 13 (MW), URINE

## 2018-01-12 ENCOUNTER — Other Ambulatory Visit: Payer: Self-pay | Admitting: Pediatrics

## 2018-01-23 ENCOUNTER — Ambulatory Visit (INDEPENDENT_AMBULATORY_CARE_PROVIDER_SITE_OTHER): Payer: Medicare Other | Admitting: *Deleted

## 2018-01-23 ENCOUNTER — Encounter: Payer: Self-pay | Admitting: *Deleted

## 2018-01-23 VITALS — BP 142/85 | HR 75 | Ht 65.0 in | Wt 130.0 lb

## 2018-01-23 DIAGNOSIS — Z0000 Encounter for general adult medical examination without abnormal findings: Secondary | ICD-10-CM

## 2018-01-23 NOTE — Patient Instructions (Signed)
  Ms. Carleene Cooperuggle , Thank you for taking time to come for your Medicare Wellness Visit. I appreciate your ongoing commitment to your health goals. Please review the following plan we discussed and let me know if I can assist you in the future.   These are the goals we discussed: Goals    . Exercise 3x per week (30 min per time) (pt-stated)     Continue to stay active and aim for at least 150 minutes of moderate activity a week.        This is a list of the screening recommended for you and due dates:  Health Maintenance  Topic Date Due  . Tetanus Vaccine  05/31/2018*  . Pneumonia vaccines (2 of 2 - PPSV23) 05/31/2018*  . Mammogram  10/07/2018  . Colon Cancer Screening  10/21/2027  . Flu Shot  Completed  . DEXA scan (bone density measurement)  Completed  .  Hepatitis C: One time screening is recommended by Center for Disease Control  (CDC) for  adults born from [birth year] through 1965.   Completed  . Pap Smear  Discontinued  *Topic was postponed. The date shown is not the original due date.

## 2018-01-23 NOTE — Progress Notes (Signed)
Subjective:   Samantha Huerta is a 72 y.o. female who presents for a subsequent Medicare Annual Wellness Visit.  Patient Care Team: Johna SheriffVincent, Carol L, MD as PCP - General (Pediatrics) Jena Gaussourk, Gerrit Friendsobert M, MD as Consulting Physician (Gastroenterology) Hurshel PartyPerez, Joseph N, OD (Optometry)  Hospitalizations, surgeries, and ER visits in previous 12 months No hospitalizations, ER visits, or surgeries this past year.   Review of Systems    Patient reports that her overall health is unchanged compared to last year.  Cardiac Risk Factors include: advanced age (>5155men, 60>65 women);hypertension;dyslipidemia;smoking/ tobacco exposure  HEENT: Glaucoma. Se[facility] 6 months  COPD: controlled with inhalers. Not interested in smoking cessation at this time.   Urinary: Nocturia. No problems with frequency during the day but does have to get up urgently at night. Limits fluids after 5:00 and has seen multiple specialist with no improvement.   All other systems negative       Current Medications (verified) Outpatient Encounter Medications as of 01/23/2018  Medication Sig  . amLODipine (NORVASC) 5 MG tablet Take 1 tablet (5 mg total) by mouth daily.  Marland Kitchen. aspirin EC 81 MG tablet Take 81 mg by mouth daily.  . brinzolamide (AZOPT) 1 % ophthalmic suspension Place 1 drop into both eyes 2 (two) times daily.  . Fluticasone-Salmeterol (ADVAIR) 250-50 MCG/DOSE AEPB USE 1 INHALATION TWICE DAILY  . [START ON 02/10/2018] HYDROcodone-acetaminophen (NORCO) 5-325 MG tablet Take 1 tablet by mouth 2 (two) times daily as needed for moderate pain.  Marland Kitchen. HYDROcodone-acetaminophen (NORCO) 5-325 MG tablet Take 1 tablet by mouth 2 (two) times daily as needed for moderate pain.  Marland Kitchen. HYDROcodone-acetaminophen (NORCO/VICODIN) 5-325 MG tablet Take 1 tablet by mouth 2 (two) times daily as needed for moderate pain.  Marland Kitchen. latanoprost (XALATAN) 0.005 % ophthalmic solution Place 1 drop into both eyes at bedtime.  Marland Kitchen.  lisinopril (PRINIVIL,ZESTRIL) 40 MG tablet TAKE ONE (1) TABLET EACH DAY  . Multiple Vitamins-Minerals (MULTIVITAMIN WITH MINERALS) tablet Take 1 tablet by mouth daily.  Marland Kitchen. omeprazole (PRILOSEC) 20 MG capsule Take 1 capsule (20 mg total) by mouth daily as needed.  . pravastatin (PRAVACHOL) 40 MG tablet Take 1 tablet (40 mg total) by mouth daily.   No facility-administered encounter medications on file as of 01/23/2018.     Allergies (verified) Patient has no known allergies.   History: Past Medical History:  Diagnosis Date  . Anxiety   . Arthritis   . COPD (chronic obstructive pulmonary disease) (HCC)   . GERD (gastroesophageal reflux disease)   . Glaucoma 1998   [facility]  . Hyperlipidemia   . Hypertension   . Osteopenia    Past Surgical History:  Procedure Laterality Date  . ABDOMINAL HYSTERECTOMY    . CARPAL TUNNEL RELEASE Bilateral   . COLONOSCOPY WITH PROPOFOL N/A 10/20/2017   Procedure: COLONOSCOPY WITH PROPOFOL;  Surgeon: Corbin Adeourk, Robert M, MD;  Location: [facility];  Service: Endoscopy;  Laterality: N/A;  1:45pm  . ELBOW SURGERY Right   . SHOULDER SURGERY Left    Family History  Problem Relation Age of Onset  . Hip fracture Mother   . Cancer Sister [id number]       breast  . Glaucoma Sister   . Diabetes Sister   . Glaucoma Sister   . Glaucoma Sister   . Glaucoma Sister   . Other Brother        killed in MVA  . Glaucoma Father   .  Colon cancer Neg Hx    Social History   Socioeconomic History  . Marital status: Divorced    Spouse name: Not on file  . Number of children: 2  . Years of education: 10  . Highest education level: 10th grade  Occupational History  . Not on file  Social Needs  . Financial resource strain: Not on file  . Food insecurity:    Worry: Never true    Inability: Never true  . Transportation needs:    Medical: No    Non-medical: No  Tobacco Use  . Smoking status: Current Every Day Smoker    Packs/day: 0.50    Years: 53.00     Pack years: 26.50    Types: Cigarettes  . Smokeless tobacco: Never Used  Substance and Sexual Activity  . Alcohol use: Yes    Alcohol/week: 5.0 standard drinks    Types: 1 Cans of beer, 4 Shots of liquor per week  . Drug use: No  . Sexual activity: Not Currently    Birth control/protection: Surgical    Comment: hyst  Lifestyle  . Physical activity:    Days per week: 5 days    Minutes per session: 60 min  . Stress: Not at all  Relationships  . Social connections:    Talks on phone: More than three times a week    Gets together: More than three times a week    Attends religious service: Never    Active member of club or organization: No    Attends meetings of clubs or organizations: Never    Relationship status: Divorced  Other Topics Concern  . Not on file  Social History Narrative   Lives at home alone. Feels safe there. She has an alarm system. She has two children. Her son lives locally and her daughter lives in [place]. She has four granddaughters. She also has two nieces that live locally that she has dinner with on occasion and they will sometimes accompany her to doctor's appointments. She works at the [employer] in [place] about 30 hours a week. She enjoys working and gets a of physical activity there. 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      Clinical Intake:  Pre-visit preparation completed: No  Pain : 0-10(No pain right now but she did take a pain pill this morning for back pain. ) Pain Type: Chronic pain Pain Location: Back Pain Orientation: Lower Pain Descriptors / Indicators: Aching Pain Onset: More than a month ago Pain Frequency: Constant Pain Relieving Factors: pain medication Effect of Pain on Daily Activities: moderate  Pain Relieving Factors: pain medication  Nutritional Status: BMI of 19-24  Normal(2 or 3 meals a day. Usually eats small portions and prepares most meals. may have ice cream before bed. Drinks mostly water  and some milk and a meal replacement shake. ) Nutritional Risks: None Diabetes: No  How often do you need to have someone help you when you read instructions, pamphlets, or other written materials from your doctor or pharmacy?: 1 - Never What is the last grade level you completed in school?: 10th  Interpreter Needed?: No  Information entered by :: Demetrios Loll, RN   Activities of Daily Living In your present state of health, do you have any difficulty performing the following activities: 01/23/2018 10/13/2017  Hearing? N N  Vision? N N  Comment Glaucoma. Sees specialist every 6 months. No problems with vision at this time.  -  Difficulty  concentrating or making decisions? N N  Walking or climbing stairs? N N  Dressing or bathing? N N  Doing errands, shopping? N N  Preparing Food and eating ? N -  Using the Toilet? N -  In the past six months, have you accidently leaked urine? N -  Do you have problems with loss of bowel control? N -  Managing your Medications? N -  Managing your Finances? N -  Housekeeping or managing your Housekeeping? 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 0 0 No No No  Comment - Has stairs to the basement with rails. She has her laundry in the basement but is ok with bringing it up and down.  Has a tub with shower inside. No handrails or shower chair.  - - -  Number falls in past yr: 0 0 - - -  Injury with Fall? 0 0 - - -  Comment - - - - -  Risk for fall due to : - - - - -  Follow up - - - - -     Objective:    Today's Vitals     01/23/18 1106  Weight: 130 lb (59 kg)  Height: 5\' 5"  (1.651 m)   Body mass index is 21.63 kg/m.  Advanced Directives 01/23/2018 10/20/2017 10/13/2017 09/19/2016 01/25/2014  Does Patient Have a Medical Advance Directive? No No No No No  Would patient like information on creating a medical advance directive? Yes (MAU/Ambulatory/Procedural Areas - Information given) No - Patient declined No - Patient declined Yes (ED - Information included in AVS) Yes - Educational materials given    Hearing/Vision  No hearing or vision deficits noted during visit.  Cognitive Function: MMSE - Mini Mental State Exam 09/19/2016  Orientation to time 5  Orientation to Place 5  Registration 3  Attention/ Calculation 5  Recall 3  Language- name 2 objects 2  Language- repeat 1  Language- follow 3 step command 3  Language- read & follow direction 1  Write a sentence 1  Copy design 1  Total score 30     6CIT Screen 01/23/2018  What Year? 0 points  What month? 0 points  What time? 0 points  Count back from 20 0 points  Months in reverse 0 points  Repeat phrase 0 points  Total Score 0   Normal Cognitive Function Screening: Yes    Immunizations and Health Maintenance Immunization History  Administered Date(s) Administered  . Influenza, High Dose Seasonal PF 12/01/2015, 12/26/2016, 12/12/2017  . Influenza,inj,Quad PF,6+ Mos 01/05/2013, 12/09/2013, 12/09/2014  . Pneumococcal Conjugate-13 03/23/2015  . Pneumococcal Polysaccharide-23 04/10/2005  . Zoster Recombinat (Shingrix) 09/19/2016, 12/26/2016   There are no preventive care reminders to display for this patient. Health Maintenance  Topic Date Due  . TETANUS/TDAP  05/31/2018 (Originally 03/11/2012)  . PNA vac Low Risk Adult (2 of 2 - PPSV23) 05/31/2018 (Originally 03/22/2016)  . MAMMOGRAM  10/07/2018  . COLONOSCOPY  10/21/2027  . INFLUENZA VACCINE  Completed  . DEXA SCAN  Completed  .  Hepatitis C Screening  Completed  . PAP SMEAR   Discontinued        Assessment:   This is a routine wellness examination for [place].    Plan:    Goals    . Exercise 3x per week (30 min per time) (pt-stated)     Continue to stay active and aim for at least 150 minutes of moderate activity a week.         Health Maintenance & Additional Screening Recommendations  Td vaccine-$45 today. Will check at next visit  Lung: Low Dose CT Chest recommended if Age 73-80 years, 30 pack-year currently smoking OR have quit w/in 15years. Patient does qualify. Hepatitis C Screening recommended: no HIV Screening recommended: no  Keep f/u with Johna Sheriff, MD and any other specialty appointments you may have Continue current medications Move carefully to avoid falls. Use assistive devices like a cane or walker if needed. Aim for at least 150 minutes of moderate activity a week. This can be done with chair exercises if necessary. Read or work on puzzles daily Stay connected with friends and family Advanced directives given. Return a signed, notarized copy to our office  I have personally reviewed and noted the following in the patient's chart:   . Medical and social history . Use of alcohol, tobacco or illicit drugs  . Current medications and supplements . Functional ability and status . Nutritional status . Physical activity . Advanced directives . List of other physicians . Hospitalizations, surgeries, and ER visits in previous 12 months . Vitals . Screenings to include cognitive, depression, and falls . Referrals and appointments  In addition, I have reviewed and discussed with patient certain preventive protocols, quality metrics, and best practice recommendations. A written personalized care plan for preventive services as well as general preventive health recommendations were provided to patient.     Demetrios Loll, RN   01/23/2018

## 2018-01-26 ENCOUNTER — Other Ambulatory Visit: Payer: Self-pay | Admitting: Pediatrics

## 2018-01-26 DIAGNOSIS — M810 Age-related osteoporosis without current pathological fracture: Secondary | ICD-10-CM

## 2018-01-26 NOTE — Progress Notes (Signed)
Blood pressure (!) 142/85, pulse 75, height 5\' 5"  (1.651 m), weight 130 lb (59 kg).

## 2018-03-13 ENCOUNTER — Telehealth: Payer: Self-pay | Admitting: Pediatrics

## 2018-03-13 NOTE — Telephone Encounter (Signed)
Advised pt she would ntbs for evaluation and pt states she just wants antibiotic called in. Offered appt in Saturday clinic and pt states she will call her manager at work to see if she can be off and then call us back.

## 2018-03-13 NOTE — Telephone Encounter (Signed)
What symptoms do you have? Sinus infection  How long have you been sick? Since Sunday  Have you been seen for this problem? no  If your provider decides to give you a prescription, which pharmacy would you [facility] for [facility]   Patient informed that this information will be sent to the clinical staff for review and that they should receive a follow up call.

## 2018-03-14 ENCOUNTER — Encounter: Payer: Self-pay | Admitting: Family Medicine

## 2018-03-14 ENCOUNTER — Ambulatory Visit (INDEPENDENT_AMBULATORY_CARE_PROVIDER_SITE_OTHER): Payer: Medicare Other | Admitting: Family Medicine

## 2018-03-14 VITALS — BP 132/64 | HR 92 | Temp 97.2°F | Ht 65.0 in | Wt 123.0 lb

## 2018-03-14 DIAGNOSIS — J441 Chronic obstructive pulmonary disease with (acute) exacerbation: Secondary | ICD-10-CM | POA: Diagnosis not present

## 2018-03-14 MED ORDER — PREDNISONE 20 MG PO TABS: ORAL_TABLET | ORAL | 0 refills | Status: DC

## 2018-03-14 MED ORDER — AZITHROMYCIN 250 MG PO TABS: ORAL_TABLET | ORAL | 0 refills | Status: DC

## 2018-03-14 MED ORDER — ALBUTEROL SULFATE HFA 108 (90 BASE) MCG/ACT IN AERS: 2.0000 | INHALATION_SPRAY | Freq: Four times a day (QID) | RESPIRATORY_TRACT | 0 refills | Status: DC | PRN

## 2018-03-14 NOTE — Progress Notes (Signed)
BP 132/64 (BP Location: Left Arm)   Pulse 92   Temp (!) 97.2 F (36.2 C) (Oral)   Ht 5\' 5"  (1.651 m)   Wt 123 lb (55.8 kg)   BMI 20.47 kg/m    Subjective:    Patient ID: Samantha Huerta, female    DOB: May 25, 1945, 73 y.o.   MRN: [id number]  HPI: Samantha Huerta is a 73 y.o. female presenting on 03/14/2018 for Cough and Nasal Congestion (light green )   HPI Cough and nasal congestion and trouble breathing and fatigue that has been going on over the past week.  She does admit that she is a chronic smoker that smoked up to a pack per day for quite many years.  She says she just feels so weak and tired and her chest feels tight and she just cannot quite get her normal breathing going.  She denies any wheezing that she knows of.  She has been using her Advair.  Her cough has been productive of green sputum.  Relevant past medical, surgical, family and social history reviewed and updated as indicated. Interim medical history since our last visit reviewed. Allergies and medications reviewed and updated.  Review of Systems  Constitutional: Negative for chills and fever.  HENT: Positive for congestion and postnasal drip. Negative for ear discharge, ear pain, rhinorrhea, sinus pressure, sneezing and sore throat.   Eyes: Negative for pain, redness and visual disturbance.  Respiratory: Positive for cough and shortness of breath. Negative for chest tightness and wheezing.   Cardiovascular: Negative for chest pain and leg swelling.  Genitourinary: Negative for difficulty urinating and dysuria.  Musculoskeletal: Negative for back pain and gait problem.  Skin: Negative for rash.  Neurological: Negative for light-headedness and headaches.  Psychiatric/Behavioral: Negative for agitation and behavioral problems.  All other systems reviewed and are negative.   Per HPI unless specifically indicated above   Allergies as of 03/14/2018   No Known Allergies     Medication List       Accurate as  of March 14, 2018  9:13 AM. Always use your most recent med list.        albuterol 108 (90 Base) MCG/ACT inhaler Commonly known as:  PROVENTIL HFA;VENTOLIN HFA Inhale 2 puffs into the lungs every 6 (six) hours as needed for wheezing or shortness of breath.   amLODipine 5 MG tablet Commonly known as:  NORVASC Take 1 tablet (5 mg total) by mouth daily.   aspirin EC 81 MG tablet Take 81 mg by mouth daily.   azithromycin 250 MG tablet Commonly known as:  ZITHROMAX Take 2 the first day and then one each day after.   brinzolamide 1 % ophthalmic suspension Commonly known as:  AZOPT Place 1 drop into both eyes 2 (two) times daily.   Fluticasone-Salmeterol 250-50 MCG/DOSE Aepb Commonly known as:  ADVAIR USE 1 INHALATION TWICE DAILY   HYDROcodone-acetaminophen 5-325 MG tablet Commonly known as:  NORCO/VICODIN Take 1 tablet by mouth 2 (two) times daily as needed for moderate pain.   HYDROcodone-acetaminophen 5-325 MG tablet Commonly known as:  NORCO Take 1 tablet by mouth 2 (two) times daily as needed for moderate pain.   HYDROcodone-acetaminophen 5-325 MG tablet Commonly known as:  NORCO Take 1 tablet by mouth 2 (two) times daily as needed for moderate pain.   latanoprost 0.005 % ophthalmic solution Commonly known as:  XALATAN Place 1 drop into both eyes at bedtime.   lisinopril 40 MG tablet Commonly known as:  PRINIVIL,ZESTRIL TAKE ONE (1) TABLET EACH DAY   multivitamin with minerals tablet Take 1 tablet by mouth daily.   omeprazole 20 MG capsule Commonly known as:  PRILOSEC Take 1 capsule (20 mg total) by mouth daily as needed.   pravastatin 40 MG tablet Commonly known as:  PRAVACHOL Take 1 tablet (40 mg total) by mouth daily.   predniSONE 20 MG tablet Commonly known as:  DELTASONE 2 po at same time daily for 5 days          Objective:    BP 132/64 (BP Location: Left Arm)   Pulse 92   Temp (!) 97.2 F (36.2 C) (Oral)   Ht 5\' 5"  (1.651 m)   Wt 123 lb  (55.8 kg)   BMI 20.47 kg/m   Wt Readings from Last 3 Encounters:  03/14/18 123 lb (55.8 kg)  01/23/18 130 lb (59 kg)  12/12/17 127 lb (57.6 kg)    Physical Exam Vitals signs reviewed.  Constitutional:      General: She is not in acute distress.    Appearance: She is well-developed. She is not diaphoretic.  HENT:     Right Ear: Tympanic membrane, ear canal and external ear normal.     Left Ear: Tympanic membrane, ear canal and external ear normal.     Nose: Mucosal edema and rhinorrhea present.     Right Sinus: No maxillary sinus tenderness or frontal sinus tenderness.     Left Sinus: No maxillary sinus tenderness or frontal sinus tenderness.     Mouth/Throat:     Pharynx: Uvula midline. No oropharyngeal exudate or posterior oropharyngeal erythema.     Tonsils: No tonsillar abscesses.  Eyes:     Conjunctiva/sclera: Conjunctivae normal.  Cardiovascular:     Rate and Rhythm: Normal rate and regular rhythm.     Heart sounds: Normal heart sounds. No murmur.  Pulmonary:     Effort: Pulmonary effort is normal. No respiratory distress.     Breath sounds: Examination of the right-middle field reveals wheezing. Examination of the left-middle field reveals wheezing. Wheezing and rhonchi present. No rales.  Musculoskeletal: Normal range of motion.        General: No tenderness.  Skin:    General: Skin is warm and dry.     Findings: No rash.  Neurological:     Mental Status: She is alert and oriented to person, place, and time.     Coordination: Coordination normal.  Psychiatric:        Behavior: Behavior normal.         Assessment & Plan:   Problem List Items Addressed This Visit    None    Visit Diagnoses    COPD exacerbation (HCC)    -  Primary   Relevant Medications   albuterol (PROVENTIL HFA;VENTOLIN HFA) 108 (90 Base) MCG/ACT inhaler   predniSONE (DELTASONE) 20 MG tablet   azithromycin (ZITHROMAX) 250 MG tablet       Follow up plan: Return if symptoms worsen or  fail to improve.  Counseling provided for all of the vaccine components No orders of the defined types were placed in this encounter.   Arville Care, MD [facility] 03/14/2018, 9:13 AM

## 2018-03-20 ENCOUNTER — Encounter: Payer: Self-pay | Admitting: Pediatrics

## 2018-03-20 ENCOUNTER — Ambulatory Visit (INDEPENDENT_AMBULATORY_CARE_PROVIDER_SITE_OTHER): Payer: Medicare Other | Admitting: Pediatrics

## 2018-03-20 VITALS — BP 132/68 | HR 91 | Temp 97.4°F | Ht 65.0 in | Wt 121.6 lb

## 2018-03-20 DIAGNOSIS — I10 Essential (primary) hypertension: Secondary | ICD-10-CM

## 2018-03-20 DIAGNOSIS — E785 Hyperlipidemia, unspecified: Secondary | ICD-10-CM | POA: Diagnosis not present

## 2018-03-20 DIAGNOSIS — M549 Dorsalgia, unspecified: Secondary | ICD-10-CM

## 2018-03-20 DIAGNOSIS — G8929 Other chronic pain: Secondary | ICD-10-CM

## 2018-03-20 DIAGNOSIS — K219 Gastro-esophageal reflux disease without esophagitis: Secondary | ICD-10-CM

## 2018-03-20 DIAGNOSIS — J449 Chronic obstructive pulmonary disease, unspecified: Secondary | ICD-10-CM

## 2018-03-20 MED ORDER — LISINOPRIL 40 MG PO TABS: ORAL_TABLET | ORAL | 1 refills | Status: DC

## 2018-03-20 MED ORDER — PRAVASTATIN SODIUM 40 MG PO TABS: 40.0000 mg | ORAL_TABLET | Freq: Every day | ORAL | 1 refills | Status: DC

## 2018-03-20 MED ORDER — HYDROCODONE-ACETAMINOPHEN 5-325 MG PO TABS: 1.0000 | ORAL_TABLET | Freq: Two times a day (BID) | ORAL | 0 refills | Status: DC | PRN

## 2018-03-20 MED ORDER — AMLODIPINE BESYLATE 5 MG PO TABS: 5.0000 mg | ORAL_TABLET | Freq: Every day | ORAL | 1 refills | Status: DC

## 2018-03-20 MED ORDER — FLUTICASONE-SALMETEROL 250-50 MCG/DOSE IN AEPB: INHALATION_SPRAY | RESPIRATORY_TRACT | 2 refills | Status: DC

## 2018-03-20 MED ORDER — OMEPRAZOLE 20 MG PO CPDR: 20.0000 mg | DELAYED_RELEASE_CAPSULE | Freq: Every day | ORAL | 1 refills | Status: DC | PRN

## 2018-03-20 NOTE — Progress Notes (Signed)
Subjective:   Patient ID: Samantha Huerta, female    DOB: [birth year], 73 y.o.   MRN: [id number] CC: Medical Management of Chronic Issues  HPI: Samantha Huerta is a 73 y.o. female   Hyperlipidemia: Taking statin regularly, no side effects  Hypertension: Taking medicine regularly, no lightheadedness or chest pain  Tobacco use: Started using patches.  Has helped with the cravings a lot, several days in.  Wants to continue decreasing smoking, goal to quit  Chronic pain: Primarily in her back.  Takes pain medicines 1-2 times a day.  Helps with her function, she is able to continue working.  Indication for chronic opioid: Arthritis Medication and dose: Hydrocodone 5/325 mg take 1-2 times a day as needed # pills per month: #45 Last UDS date: 12/2017 reviewed, appropriate Opioid Treatment Agreement signed (Y/N): Yes Opioid Treatment Agreement last reviewed with patient:   Today [facility] reviewed this encounter (include red flags): Yes  Reviewed today, appropriate   Relevant past medical, surgical, family and social history reviewed. Allergies and medications reviewed and updated. Social History   Tobacco Use  Smoking Status Current Every Day Smoker  . Packs/day: 0.50  . Years: 53.00  . Pack years: 26.50  . Types: Cigarettes  Smokeless Tobacco Never Used   ROS: Per HPI   Objective:    BP 132/68   Pulse 91   Temp (!) 97.4 F (36.3 C) (Oral)   Ht 5\' 5"  (1.651 m)   Wt 121 lb 9.6 oz (55.2 kg)   BMI 20.24 kg/m   Wt Readings from Last 3 Encounters:  03/20/18 121 lb 9.6 oz (55.2 kg)  03/14/18 123 lb (55.8 kg)  01/23/18 130 lb (59 kg)    Gen: NAD, alert, cooperative with exam, NCAT EYES: EOMI, no conjunctival injection, or no icterus ENT:  TMs pearly gray b/l, OP without erythema LYMPH: no cervical LAD CV: NRRR, normal S1/S2, no murmur, distal pulses 2+ b/l Resp: CTABL, no wheezes, normal WOB Ext: No edema, warm Neuro: Alert and oriented MSK: normal muscle  bulk  Assessment & Plan:  Samantha Huerta was seen today for medical management of chronic issues.  Diagnoses and all orders for this visit:  Chronic obstructive pulmonary disease, unspecified COPD type (HCC) Stable, continue below.  Continue work on tobacco cessation, has been making good progress with the patches. -     Fluticasone-Salmeterol (ADVAIR) 250-50 MCG/DOSE AEPB; USE 1 INHALATION TWICE DAILY  Hyperlipidemia, unspecified hyperlipidemia type Stable, continue below -     pravastatin (PRAVACHOL) 40 MG tablet; Take 1 tablet (40 mg total) by mouth daily.  Gastroesophageal reflux disease without esophagitis Stable, continue below -     omeprazole (PRILOSEC) 20 MG capsule; Take 1 capsule (20 mg total) by mouth daily as needed.  Essential hypertension Stable, continue below -     lisinopril (PRINIVIL,ZESTRIL) 40 MG tablet; TAKE ONE (1) TABLET EACH DAY -     amLODipine (NORVASC) 5 MG tablet; Take 1 tablet (5 mg total) by mouth daily.  Chronic back pain, unspecified back location, unspecified back pain laterality Stable, continue below, taking as needed up to twice a day, not taking twice every day.  Does help keep her active, she is able to continue work because of decreased back pain. -     HYDROcodone-acetaminophen (NORCO/VICODIN) 5-325 MG tablet; Take 1 tablet by mouth 2 (two) times daily as needed for moderate pain. -     HYDROcodone-acetaminophen (NORCO) 5-325 MG tablet; Take 1 tablet by mouth 2 (two) times  daily as needed for moderate pain. -     HYDROcodone-acetaminophen (NORCO) 5-325 MG tablet; Take 1 tablet by mouth 2 (two) times daily as needed for moderate pain.  Follow up plan: Return in about 3 months (around 06/19/2018). Rex Krasarol Garland Smouse, MD [facility]

## 2018-04-20 ENCOUNTER — Other Ambulatory Visit: Payer: Self-pay | Admitting: Pediatrics

## 2018-04-20 DIAGNOSIS — M810 Age-related osteoporosis without current pathological fracture: Secondary | ICD-10-CM

## 2018-06-11 ENCOUNTER — Ambulatory Visit (INDEPENDENT_AMBULATORY_CARE_PROVIDER_SITE_OTHER): Payer: Medicare Other | Admitting: Family Medicine

## 2018-06-11 ENCOUNTER — Other Ambulatory Visit: Payer: Self-pay

## 2018-06-11 ENCOUNTER — Encounter: Payer: Self-pay | Admitting: Family Medicine

## 2018-06-11 VITALS — BP 148/76 | HR 108 | Temp 97.8°F | Ht 65.0 in | Wt 123.0 lb

## 2018-06-11 DIAGNOSIS — Z0289 Encounter for other administrative examinations: Secondary | ICD-10-CM

## 2018-06-11 DIAGNOSIS — K219 Gastro-esophageal reflux disease without esophagitis: Secondary | ICD-10-CM

## 2018-06-11 DIAGNOSIS — J449 Chronic obstructive pulmonary disease, unspecified: Secondary | ICD-10-CM

## 2018-06-11 DIAGNOSIS — T148XXD Other injury of unspecified body region, subsequent encounter: Secondary | ICD-10-CM

## 2018-06-11 DIAGNOSIS — I10 Essential (primary) hypertension: Secondary | ICD-10-CM | POA: Diagnosis not present

## 2018-06-11 DIAGNOSIS — M545 Low back pain, unspecified: Secondary | ICD-10-CM

## 2018-06-11 DIAGNOSIS — S51812A Laceration without foreign body of left forearm, initial encounter: Secondary | ICD-10-CM

## 2018-06-11 DIAGNOSIS — L03114 Cellulitis of left upper limb: Secondary | ICD-10-CM

## 2018-06-11 DIAGNOSIS — Z23 Encounter for immunization: Secondary | ICD-10-CM | POA: Diagnosis not present

## 2018-06-11 DIAGNOSIS — G8929 Other chronic pain: Secondary | ICD-10-CM | POA: Diagnosis not present

## 2018-06-11 DIAGNOSIS — E782 Mixed hyperlipidemia: Secondary | ICD-10-CM

## 2018-06-11 DIAGNOSIS — R911 Solitary pulmonary nodule: Secondary | ICD-10-CM

## 2018-06-11 MED ORDER — ASPIRIN EC 81 MG PO TBEC: 81.0000 mg | DELAYED_RELEASE_TABLET | Freq: Every day | ORAL | 3 refills | Status: DC

## 2018-06-11 MED ORDER — AMLODIPINE BESYLATE 5 MG PO TABS: 5.0000 mg | ORAL_TABLET | Freq: Every day | ORAL | 1 refills | Status: DC

## 2018-06-11 MED ORDER — HYDROCODONE-ACETAMINOPHEN 5-325 MG PO TABS: 1.0000 | ORAL_TABLET | Freq: Two times a day (BID) | ORAL | 0 refills | Status: DC | PRN

## 2018-06-11 MED ORDER — ALBUTEROL SULFATE HFA 108 (90 BASE) MCG/ACT IN AERS: 2.0000 | INHALATION_SPRAY | Freq: Four times a day (QID) | RESPIRATORY_TRACT | 0 refills | Status: DC | PRN

## 2018-06-11 MED ORDER — OMEPRAZOLE 20 MG PO CPDR: 20.0000 mg | DELAYED_RELEASE_CAPSULE | Freq: Every day | ORAL | 1 refills | Status: DC | PRN

## 2018-06-11 MED ORDER — FLUTICASONE-SALMETEROL 250-50 MCG/DOSE IN AEPB: INHALATION_SPRAY | RESPIRATORY_TRACT | 2 refills | Status: DC

## 2018-06-11 MED ORDER — CEPHALEXIN 500 MG PO CAPS: 500.0000 mg | ORAL_CAPSULE | Freq: Four times a day (QID) | ORAL | 0 refills | Status: AC

## 2018-06-11 MED ORDER — LISINOPRIL 40 MG PO TABS: ORAL_TABLET | ORAL | 1 refills | Status: DC

## 2018-06-11 MED ORDER — PRAVASTATIN SODIUM 40 MG PO TABS: 40.0000 mg | ORAL_TABLET | Freq: Every day | ORAL | 1 refills | Status: DC

## 2018-06-11 NOTE — Progress Notes (Signed)
Subjective:  Patient ID: Samantha Huerta, female    DOB: [birth year], 73 y.o.   MRN: [id number]  Chief Complaint:  pain management (refills) and Medical Management of Chronic Issues   HPI: Samantha Huerta is a 73 y.o. female presenting on 06/11/2018 for pain management (refills) and Medical Management of Chronic Issues   1. Essential hypertension  Complaint with meds - Yes Checking BP at home ranging 120/80 Exercising Regularly - No Watching Salt intake - Yes Pertinent ROS:  Headache - No Chest pain - No Dyspnea - No Palpitations - No LE edema - No They report good compliance with medications and can restate their regimen by memory. No medication side effects.  BP Readings from Last 3 Encounters:  06/11/18 (!) 148/76  03/20/18 132/68  03/14/18 132/64     2. COPD (chronic obstructive pulmonary disease) with chronic bronchitis ([place])  Doing well with current medications. Has not needed rescue inhaler in several weeks. Last exacerbation in January 2020, not hospitalized.    3. Gastroesophageal reflux disease without esophagitis  Doing well. Compliant with medications without associated side effects. No abdominal pain, changes in stool color, sore throat, cough, voice changes, or hemoptysis.    4. Mixed hyperlipidemia  Tolerating medications well. Does try to watch diet. Does not exercise on a regular basis.    5. Chronic midline low back pain without sciatica  Pain assessment: Periodic Pain Management.  Cause of pain- Arthritis Pain location- lower back and right wrist Pain on scale of 1-10- 2/10 toady, 8/10 at worst Frequency- daily What increases pain-working What makes pain Better-rest Effects on ADL - able to perform ADLs and continues to work Any change in general medical condition-no  Current opioids rx- Hydrocodone 5/325 mg, 1-2 times per day as needed # meds rx- #45 Effectiveness of current meds-good pain control, usually only needs on days she is working  Adverse reactions form pain meds-none Morphine equivalent- 23m MEDD  Pill count performed-No Last drug screen - 12/2017 ( high risk q366mmoderate risk [id number] risk yearly ) Urine drug screen today- Yes [place] [facility] [place] [place]  If yes were their any concerning findings? - no  Opioid Risk  06/11/2018  Alcohol 0  Illegal Drugs 0  Rx Drugs 0  Alcohol 0  Illegal Drugs 0  Rx Drugs 0  Age between 16-45 years  0  History of Preadolescent Sexual Abuse 0  Psychological Disease 0  Depression 0  Opioid Risk Tool Scoring 0  Opioid Risk Interpretation Low Risk     Pain contract signed on: 06/11/2018  6. Lung nodule  Due for follow up CT scan, will order today. No increased cough, shortness of breath, fever, chills, or weight loss.    7. Laceration of left forearm, initial encounter  Pt reports she cut her left forearm on the dumpster lid last Saturday. Pt states she has been cleaning and dressing the wound daily. States the wound is not healing. States she is unsure of her last tetanus vaccine. She reports the wound is tender to touch and oozing yellow colored drainage. She denies fever or chills. No weakness or confusion.      Relevant past medical, surgical, family, and social history reviewed and updated as indicated.  Allergies and medications reviewed and updated.   Past Medical History:  Diagnosis Date  . Anxiety   . Arthritis   . COPD (chronic obstructive pulmonary disease) ([place] . GERD (gastroesophageal reflux disease)   .  Glaucoma 1998   Dr Sharen Counter [facility]  . Hyperlipidemia   . Hypertension   . Osteopenia     Past Surgical History:  Procedure Laterality Date  . ABDOMINAL HYSTERECTOMY    . CARPAL TUNNEL RELEASE Bilateral   . COLONOSCOPY WITH PROPOFOL N/A 10/20/2017   Procedure: COLONOSCOPY WITH PROPOFOL;  Surgeon: Daneil Dolin, MD;  Location: [facility];  Service: Endoscopy;  Laterality: N/A;  1:45pm  . ELBOW SURGERY Right   . SHOULDER SURGERY Left      Social History   Socioeconomic History  . Marital status: Divorced    Spouse name: Not on file  . Number of children: 2  . Years of education: 10  . Highest education level: 10th grade  Occupational History  . Not on file  Social Needs  . Financial resource strain: Not on file  . Food insecurity:    Worry: Never true    Inability: Never true  . Transportation needs:    Medical: No    Non-medical: No  Tobacco Use  . Smoking status: Current Every Day Smoker    Packs/day: 0.50    Years: 53.00    Pack years: 26.50    Types: Cigarettes  . Smokeless tobacco: Never Used  Substance and Sexual Activity  . Alcohol use: Yes    Alcohol/week: 5.0 standard drinks    Types: 1 Cans of beer, 4 Shots of liquor per week  . Drug use: No  . Sexual activity: Not Currently    Birth control/protection: Surgical    Comment: hyst  Lifestyle  . Physical activity:    Days per week: 5 days    Minutes per session: 60 min  . Stress: Not at all  Relationships  . Social connections:    Talks on phone: More than three times a week    Gets together: More than three times a week    Attends religious service: Never    Active member of club or organization: No    Attends meetings of clubs or organizations: Never    Relationship status: Divorced  . Intimate partner violence:    Fear of current or ex partner: No    Emotionally abused: No    Physically abused: No    Forced sexual activity: Not on file  Other Topics Concern  . Not on file  Social History Narrative   Lives at home alone. Feels safe there. She has an alarm system. She has two children. Her son lives locally and her daughter lives in [place]. She has four granddaughters. She also has two nieces that live locally that she has dinner with on occasion and they will sometimes accompany her to doctor's appointments. She works at the [employer] in [place] about 30 hours a week. She enjoys working and gets a of physical  activity there. 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     Outpatient Encounter Medications as of 06/11/2018  Medication Sig  . albuterol (PROVENTIL HFA;VENTOLIN HFA) 108 (90 Base) MCG/ACT inhaler Inhale 2 puffs into the lungs every 6 (six) hours as needed for wheezing or shortness of breath.  Marland Kitchen amLODipine (NORVASC) 5 MG tablet Take 1 tablet (5 mg total) by mouth daily.  Marland Kitchen aspirin EC 81 MG tablet Take 1 tablet (81 mg total) by mouth daily.  . brinzolamide (AZOPT) 1 % ophthalmic suspension Place 1 drop into both eyes 2 (two) times daily.  . Fluticasone-Salmeterol (ADVAIR) 250-50 MCG/DOSE AEPB USE  1 INHALATION TWICE DAILY  . [START ON 08/11/2018] HYDROcodone-acetaminophen (NORCO) 5-325 MG tablet Take 1 tablet by mouth 2 (two) times daily as needed for moderate pain.  Derrill Memo ON 07/11/2018] HYDROcodone-acetaminophen (NORCO) 5-325 MG tablet Take 1 tablet by mouth 2 (two) times daily as needed for moderate pain.  Marland Kitchen HYDROcodone-acetaminophen (NORCO/VICODIN) 5-325 MG tablet Take 1 tablet by mouth 2 (two) times daily as needed for moderate pain.  Marland Kitchen latanoprost (XALATAN) 0.005 % ophthalmic solution Place 1 drop into both eyes at bedtime.  Marland Kitchen lisinopril (PRINIVIL,ZESTRIL) 40 MG tablet TAKE ONE (1) TABLET EACH DAY  . Multiple Vitamins-Minerals (MULTIVITAMIN WITH MINERALS) tablet Take 1 tablet by mouth daily.  Marland Kitchen omeprazole (PRILOSEC) 20 MG capsule Take 1 capsule (20 mg total) by mouth daily as needed.  . pravastatin (PRAVACHOL) 40 MG tablet Take 1 tablet (40 mg total) by mouth daily.  . [DISCONTINUED] albuterol (PROVENTIL HFA;VENTOLIN HFA) 108 (90 Base) MCG/ACT inhaler Inhale 2 puffs into the lungs every 6 (six) hours as needed for wheezing or shortness of breath.  . [DISCONTINUED] amLODipine (NORVASC) 5 MG tablet Take 1 tablet (5 mg total) by mouth daily.  . [DISCONTINUED] aspirin EC 81 MG tablet Take 81 mg by mouth daily.  . [DISCONTINUED] Fluticasone-Salmeterol (ADVAIR) 250-50 MCG/DOSE AEPB USE 1  INHALATION TWICE DAILY  . [DISCONTINUED] HYDROcodone-acetaminophen (NORCO) 5-325 MG tablet Take 1 tablet by mouth 2 (two) times daily as needed for moderate pain.  . [DISCONTINUED] HYDROcodone-acetaminophen (NORCO) 5-325 MG tablet Take 1 tablet by mouth 2 (two) times daily as needed for moderate pain.  . [DISCONTINUED] HYDROcodone-acetaminophen (NORCO/VICODIN) 5-325 MG tablet Take 1 tablet by mouth 2 (two) times daily as needed for moderate pain.  . [DISCONTINUED] lisinopril (PRINIVIL,ZESTRIL) 40 MG tablet TAKE ONE (1) TABLET EACH DAY  . [DISCONTINUED] omeprazole (PRILOSEC) 20 MG capsule Take 1 capsule (20 mg total) by mouth daily as needed.  . [DISCONTINUED] pravastatin (PRAVACHOL) 40 MG tablet Take 1 tablet (40 mg total) by mouth daily.  . cephALEXin (KEFLEX) 500 MG capsule Take 1 capsule (500 mg total) by mouth 4 (four) times daily for 5 days.   No facility-administered encounter medications on file as of 06/11/2018.     No Known Allergies  Review of Systems  Constitutional: Negative for activity change, appetite change, chills, fatigue, fever and unexpected weight change.  HENT: Negative for congestion, sore throat, trouble swallowing and voice change.   Eyes: Negative for photophobia and visual disturbance.  Respiratory: Positive for cough. Negative for chest tightness, shortness of breath and wheezing.   Cardiovascular: Negative for chest pain, palpitations and leg swelling.  Gastrointestinal: Negative for abdominal pain, anal bleeding, blood in stool, constipation, diarrhea, nausea, rectal pain and vomiting.  Endocrine: Negative for polydipsia, polyphagia and polyuria.  Genitourinary: Negative for decreased urine volume, difficulty urinating and hematuria.  Musculoskeletal: Positive for arthralgias and back pain. Negative for gait problem, joint swelling, myalgias, neck pain and neck stiffness.  Skin: Positive for wound. Negative for color change.  Neurological: Negative for dizziness,  tremors, seizures, syncope, facial asymmetry, speech difficulty, weakness, light-headedness, numbness and headaches.  Psychiatric/Behavioral: Negative for confusion and sleep disturbance.  All other systems reviewed and are negative.       Objective:  BP (!) 148/76   Pulse (!) 108   Temp 97.8 F (36.6 C) (Oral)   Ht _0  (1.651 m)   Wt 123 lb (55.8 kg)   BMI 20.47 kg/m    Wt Readings from Last 3 Encounters:  06/11/18  123 lb (55.8 kg)  03/20/18 121 lb 9.6 oz (55.2 kg)  03/14/18 123 lb (55.8 kg)    Physical Exam Vitals signs and nursing note reviewed.  Constitutional:      General: She is not in acute distress.    Appearance: Normal appearance. She is not ill-appearing or toxic-appearing.  HENT:     Head: Normocephalic and atraumatic.     Jaw: There is normal jaw occlusion.     Right Ear: Hearing, tympanic membrane, ear canal and external ear normal.     Left Ear: Hearing, tympanic membrane, ear canal and external ear normal.     Nose: Nose normal.     Mouth/Throat:     Lips: Pink.     Mouth: Mucous membranes are moist.     Pharynx: Oropharynx is clear. Uvula midline. No oropharyngeal exudate or posterior oropharyngeal erythema.  Eyes:     General: Lids are normal.     Extraocular Movements: Extraocular movements intact.     Conjunctiva/sclera: Conjunctivae normal.     Pupils: Pupils are equal, round, and reactive to light.  Neck:     Musculoskeletal: Normal range of motion and neck supple. No neck rigidity or muscular tenderness.     Thyroid: No thyroid mass, thyromegaly or thyroid tenderness.     Vascular: No carotid bruit or JVD.     Trachea: Trachea and phonation normal.  Cardiovascular:     Rate and Rhythm: Normal rate and regular rhythm.     Chest Wall: PMI is not displaced.     Pulses: Normal pulses.     Heart sounds: Normal heart sounds. No murmur. No friction rub. No gallop.   Pulmonary:     Effort: Pulmonary effort is normal.     Breath sounds: Wheezing  (scattered, mild) and rhonchi (BLL, mild) present.  Abdominal:     General: Abdomen is flat. Bowel sounds are normal. There is no abdominal bruit.     Palpations: Abdomen is soft.     Tenderness: There is no abdominal tenderness.  Musculoskeletal:     Lumbar back: She exhibits decreased range of motion (Pain with FROM), tenderness and pain. She exhibits no bony tenderness, no swelling, no edema, no deformity, no laceration, no spasm and normal pulse.     Right lower leg: No edema.     Left lower leg: No edema.  Lymphadenopathy:     Cervical: No cervical adenopathy.  Skin:    General: Skin is warm and dry.     Capillary Refill: Capillary refill takes less than 2 seconds.     Findings: Wound present.       Neurological:     General: No focal deficit present.     Mental Status: She is alert and oriented to person, place, and time.     Cranial Nerves: Cranial nerves are intact.     Sensory: Sensation is intact.     Motor: Motor function is intact.     Coordination: Coordination is intact.     Gait: Gait is intact.     Deep Tendon Reflexes: Reflexes are normal and symmetric.  Psychiatric:        Attention and Perception: Attention and perception normal.        Mood and Affect: Mood and affect normal.        Speech: Speech normal.        Behavior: Behavior normal. Behavior is cooperative.        Thought Content: Thought content normal.  Cognition and Memory: Cognition normal.        Judgment: Judgment normal.       Results for orders placed or performed in visit on 12/12/17  BMP8+EGFR  Result Value Ref Range   Glucose 96 65 - 99 mg/dL   BUN 11 8 - 27 mg/dL   Creatinine, Ser 0.54 (L) 0.57 - 1.00 mg/dL   GFR calc non Af Amer 94 >59 mL/min/1.73   GFR calc Af Amer 108 >59 mL/min/1.73   BUN/Creatinine Ratio 20 12 - 28   Sodium 133 (L) 134 - 144 mmol/L   Potassium 4.4 3.5 - 5.2 mmol/L   Chloride 99 96 - 106 mmol/L   CO2 21 20 - 29 mmol/L   Calcium 9.4 8.7 - 10.3 mg/dL   ToxASSURE Select 13 (MW), Urine  Result Value Ref Range   Summary FINAL        Pertinent labs & imaging results that were available during my care of the patient were reviewed by me and considered in my medical decision making.  Assessment & Plan:  Malary was seen today for pain management and medical management of chronic issues.  Diagnoses and all orders for this visit:  Essential hypertension Diet and exercise encouraged. Labs pending. Report any persistent highs or lows. Medications as prescribed.  -     amLODipine (NORVASC) 5 MG tablet; Take 1 tablet (5 mg total) by mouth daily. -     aspirin EC 81 MG tablet; Take 1 tablet (81 mg total) by mouth daily. -     lisinopril (PRINIVIL,ZESTRIL) 40 MG tablet; TAKE ONE (1) TABLET EACH DAY -     CBC with Differential/Platelet -     CMP14+EGFR -     Lipid panel -     TSH -     Microalbumin / creatinine urine ratio  COPD (chronic obstructive pulmonary disease) with chronic bronchitis (HCC) Stable, continue below.  -     albuterol (PROVENTIL HFA;VENTOLIN HFA) 108 (90 Base) MCG/ACT inhaler; Inhale 2 puffs into the lungs every 6 (six) hours as needed for wheezing or shortness of breath. -     Fluticasone-Salmeterol (ADVAIR) 250-50 MCG/DOSE AEPB; USE 1 INHALATION TWICE DAILY  Gastroesophageal reflux disease without esophagitis Stable, continue below.  -     omeprazole (PRILOSEC) 20 MG capsule; Take 1 capsule (20 mg total) by mouth daily as needed.  Mixed hyperlipidemia Diet and exercise encouraged. Labs pending. Continue medications as prescribed.  -     pravastatin (PRAVACHOL) 40 MG tablet; Take 1 tablet (40 mg total) by mouth daily. -     CMP14+EGFR -     Lipid panel  Chronic midline low back pain without sciatica Pain management discussed. Discussed decreasing the amount of medications. Will readdress at next appointment.  -     HYDROcodone-acetaminophen (NORCO) 5-325 MG tablet; Take 1 tablet by mouth 2 (two) times daily as needed  for moderate pain. -     HYDROcodone-acetaminophen (NORCO) 5-325 MG tablet; Take 1 tablet by mouth 2 (two) times daily as needed for moderate pain. -     HYDROcodone-acetaminophen (NORCO/VICODIN) 5-325 MG tablet; Take 1 tablet by mouth 2 (two) times daily as needed for moderate pain. -     ToxASSURE Select 13 (MW), Urine  Lung nodule Due for follow up CT, ordered today.  -     CT Chest Wo Contrast; Future  Pain management contract signed Discussed decreasing the amount of medications. Will readdress at next appointment.  -  HYDROcodone-acetaminophen (NORCO) 5-325 MG tablet; Take 1 tablet by mouth 2 (two) times daily as needed for moderate pain. -     HYDROcodone-acetaminophen (NORCO) 5-325 MG tablet; Take 1 tablet by mouth 2 (two) times daily as needed for moderate pain. -     HYDROcodone-acetaminophen (NORCO/VICODIN) 5-325 MG tablet; Take 1 tablet by mouth 2 (two) times daily as needed for moderate pain. -     ToxASSURE Select 13 (MW), Urine  Laceration of left forearm, initial encounter Unable to close due to timing of injury. Proper wound care discussed. Will update tetanus today due to nature of injury.  -     Td vaccine greater than or equal to 7yo preservative free IM -     cephALEXin (KEFLEX) 500 MG capsule; Take 1 capsule (500 mg total) by mouth 4 (four) times daily for 5 days.  Cellulitis of left upper extremity Proper wound care discussed in detail. Medications as prescribed. Report any new or worsening symptoms.  -     cephALEXin (KEFLEX) 500 MG capsule; Take 1 capsule (500 mg total) by mouth 4 (four) times daily for 5 days.  Delayed wound healing Wound cleaned with Hibiclens and packed with saline soaked 2x2 then covered with 4x4 and wrapped. Detailed instructions on proper wound care and wound dressing at home discussed. Pt to return in one week for wound recheck or sooner if symptoms worsen.   -     cephALEXin (KEFLEX) 500 MG capsule; Take 1 capsule (500 mg total) by mouth  4 (four) times daily for 5 days.     Continue all other maintenance medications.  Follow up plan: Return in about 1 week (around 06/18/2018), or if symptoms worsen or fail to improve, for wound recheck.  Educational handout given for wound care  The above assessment and management plan was discussed with the patient. The patient verbalized understanding of and has agreed to the management plan. Patient is aware to call the clinic if symptoms persist or worsen. Patient is aware when to return to the clinic for a follow-up visit. Patient educated on when it is appropriate to go to the emergency department.    Total time spent with patient 40 minutes.  Greater than 50% of encounter spent in coordination of care/counseling.  Monia Pouch, FNP-C [facility] [phone]

## 2018-06-11 NOTE — Patient Instructions (Signed)
Wound Care, Adult  Taking care of your wound properly can help to prevent pain, infection, and scarring. It can also help your wound to heal more quickly.  How to care for your wound  Wound care          Follow instructions from your health care provider about how to take care of your wound. Make sure you:  ? Wash your hands with soap and water before you change the bandage (dressing). If soap and water are not available, use hand sanitizer.  ? Change your dressing as told by your health care provider.  ? Leave stitches (sutures), skin glue, or adhesive strips in place. These skin closures may need to stay in place for 2 weeks or longer. If adhesive strip edges start to loosen and curl up, you may trim the loose edges. Do not remove adhesive strips completely unless your health care provider tells you to do that.   Check your wound area every day for signs of infection. Check for:  ? Redness, swelling, or pain.  ? Fluid or blood.  ? Warmth.  ? Pus or a bad smell.   Ask your health care provider if you should clean the wound with mild soap and water. Doing this may include:  ? Using a clean towel to pat the wound dry after cleaning it. Do not rub or scrub the wound.  ? Applying a cream or ointment. Do this only as told by your health care provider.  ? Covering the incision with a clean dressing.   Ask your health care provider when you can leave the wound uncovered.   Keep the dressing dry until your health care provider says it can be removed. Do not take baths, swim, use a hot tub, or do anything that would put the wound underwater until your health care provider approves. Ask your health care provider if you can take showers. You may only be allowed to take sponge baths.  Medicines     If you were prescribed an antibiotic medicine, cream, or ointment, take or use the antibiotic as told by your health care provider. Do not stop taking or using the antibiotic even if your condition improves.   Take  over-the-counter and prescription medicines only as told by your health care provider. If you were prescribed pain medicine, take it 30 or more minutes before you do any wound care or as told by your health care provider.  General instructions   Return to your normal activities as told by your health care provider. Ask your health care provider what activities are safe.   Do not scratch or pick at the wound.   Do not use any products that contain nicotine or tobacco, such as cigarettes and e-cigarettes. These may delay wound healing. If you need help quitting, ask your health care provider.   Keep all follow-up visits as told by your health care provider. This is important.   Eat a diet that includes protein, vitamin A, vitamin C, and other nutrient-rich foods to help the wound heal.  ? Foods rich in protein include meat, dairy, beans, nuts, and other sources.  ? Foods rich in vitamin A include carrots and dark green, leafy vegetables.  ? Foods rich in vitamin C include citrus, tomatoes, and other fruits and vegetables.  ? Nutrient-rich foods have protein, carbohydrates, fat, vitamins, or minerals. Eat a variety of healthy foods including vegetables, fruits, and whole grains.  Contact a health care provider if:     You received a tetanus shot and you have swelling, severe pain, redness, or bleeding at the injection site.   Your pain is not controlled with medicine.   You have redness, swelling, or pain around the wound.   You have fluid or blood coming from the wound.   Your wound feels warm to the touch.   You have pus or a bad smell coming from the wound.   You have a fever or chills.   You are nauseous or you vomit.   You are dizzy.  Get help right away if:   You have a red streak going away from your wound.   The edges of the wound open up and separate.   Your wound is bleeding, and the bleeding does not stop with gentle pressure.   You have a rash.   You faint.   You have trouble  breathing.  Summary   Always wash your hands with soap and water before changing your bandage (dressing).   To help with healing, eat foods that are rich in protein, vitamin A, vitamin C, and other nutrients.   Check your wound every day for signs of infection. Contact your health care provider if you suspect that your wound is infected.  This information is not intended to replace advice given to you by your health care provider. Make sure you discuss any questions you have with your health care provider.  Document Released: 12/05/2007 Document Revised: 04/08/2017 Document Reviewed: 09/12/2015  Elsevier Interactive Patient Education  2019 [employer]

## 2018-06-12 ENCOUNTER — Ambulatory Visit: Payer: Medicare Other | Admitting: Family Medicine

## 2018-06-12 LAB — CBC WITH DIFFERENTIAL/PLATELET
Basophils Absolute: 0 10*3/uL (ref 0.0–0.2)
Basos: 0 %
EOS (ABSOLUTE): 0.1 10*3/uL (ref 0.0–0.4)
Eos: 1 %
Hematocrit: 38.7 % (ref 34.0–46.6)
Hemoglobin: 12.9 g/dL (ref 11.1–15.9)
Immature Grans (Abs): 0 10*3/uL (ref 0.0–0.1)
Immature Granulocytes: 0 %
Lymphocytes Absolute: 0.7 10*3/uL (ref 0.7–3.1)
Lymphs: 11 %
MCH: 30.8 pg (ref 26.6–33.0)
MCHC: 33.3 g/dL (ref 31.5–35.7)
MCV: 92 fL (ref 79–97)
Monocytes Absolute: 0.9 10*3/uL (ref 0.1–0.9)
Monocytes: 13 %
Neutrophils Absolute: 5.2 10*3/uL (ref 1.4–7.0)
Neutrophils: 75 %
Platelets: 493 10*3/uL — ABNORMAL HIGH (ref 150–450)
RBC: 4.19 x10E6/uL (ref 3.77–5.28)
RDW: 12.5 % (ref 11.7–15.4)
WBC: 6.9 10*3/uL (ref 3.4–10.8)

## 2018-06-12 LAB — TSH: TSH: 0.936 u[IU]/mL (ref 0.450–4.500)

## 2018-06-12 LAB — CMP14+EGFR
ALT: 11 IU/L (ref 0–32)
AST: 20 IU/L (ref 0–40)
Albumin/Globulin Ratio: 2.7 — ABNORMAL HIGH (ref 1.2–2.2)
Albumin: 4.8 g/dL — ABNORMAL HIGH (ref 3.7–4.7)
Alkaline Phosphatase: 68 IU/L (ref 39–117)
BUN/Creatinine Ratio: 15 (ref 12–28)
BUN: 10 mg/dL (ref 8–27)
Bilirubin Total: 0.5 mg/dL (ref 0.0–1.2)
CO2: 23 mmol/L (ref 20–29)
Calcium: 10.1 mg/dL (ref 8.7–10.3)
Chloride: 95 mmol/L — ABNORMAL LOW (ref 96–106)
Creatinine, Ser: 0.66 mg/dL (ref 0.57–1.00)
GFR calc Af Amer: 101 mL/min/{1.73_m2} (ref >59)
GFR calc non Af Amer: 87 mL/min/{1.73_m2} (ref >59)
Globulin, Total: 1.8 g/dL (ref 1.5–4.5)
Glucose: 95 mg/dL (ref 65–99)
Potassium: 5 mmol/L (ref 3.5–5.2)
Sodium: 133 mmol/L — ABNORMAL LOW (ref 134–144)
Total Protein: 6.6 g/dL (ref 6.0–8.5)

## 2018-06-12 LAB — MICROALBUMIN / CREATININE URINE RATIO
Creatinine, Urine: 30.8 mg/dL
Microalb/Creat Ratio: <10 mg/g creat (ref 0–29)
Microalbumin, Urine: <3 ug/mL

## 2018-06-12 LAB — LIPID PANEL
Chol/HDL Ratio: 1.7 ratio (ref 0.0–4.4)
Cholesterol, Total: 202 mg/dL — ABNORMAL HIGH (ref 100–199)
HDL: 121 mg/dL (ref >39)
LDL Calculated: 72 mg/dL (ref 0–99)
Triglycerides: 46 mg/dL (ref 0–149)
VLDL Cholesterol Cal: 9 mg/dL (ref 5–40)

## 2018-06-14 LAB — TOXASSURE SELECT 13 (MW), URINE

## 2018-06-17 ENCOUNTER — Other Ambulatory Visit: Payer: Self-pay

## 2018-06-18 ENCOUNTER — Encounter: Payer: Self-pay | Admitting: Family Medicine

## 2018-06-18 ENCOUNTER — Ambulatory Visit (INDEPENDENT_AMBULATORY_CARE_PROVIDER_SITE_OTHER): Payer: Medicare Other | Admitting: Family Medicine

## 2018-06-18 VITALS — BP 130/77 | HR 88 | Temp 97.8°F | Ht 65.0 in | Wt 121.0 lb

## 2018-06-18 DIAGNOSIS — S56522D Laceration of other extensor muscle, fascia and tendon at forearm level, left arm, subsequent encounter: Secondary | ICD-10-CM | POA: Diagnosis not present

## 2018-06-18 DIAGNOSIS — S51812D Laceration without foreign body of left forearm, subsequent encounter: Secondary | ICD-10-CM

## 2018-06-18 NOTE — Progress Notes (Addendum)
Subjective:  Patient ID: Samantha Huerta, female    DOB: 22-Feb-1946, 73 y.o.   MRN: [id number]  Chief Complaint:  1 week recheck   HPI: Samantha Huerta is a 73 y.o. female presenting on 06/18/2018 for 1 week recheck   1. Laceration of left forearm, subsequent encounter   Pt presents today for one week follow up of left forearm laceration. Pt has completed course of antibiotics and has been packing wound with saline soaked cause as discussed. Pt denies fever, chills, weakness, or confusion. No drainage, swelling, or erythema around wound. Pt states the wound has closed up some.    Relevant past medical, surgical, family, and social history reviewed and updated as indicated.  Allergies and medications reviewed and updated.   Past Medical History:  Diagnosis Date  . Anxiety   . Arthritis   . COPD (chronic obstructive pulmonary disease) ([place])   . GERD (gastroesophageal reflux disease)   . Glaucoma 1998   Dr Sharen Counter in [facility]  . Hyperlipidemia   . Hypertension   . Osteopenia     Past Surgical History:  Procedure Laterality Date  . ABDOMINAL HYSTERECTOMY    . CARPAL TUNNEL RELEASE Bilateral   . COLONOSCOPY WITH PROPOFOL N/A 10/20/2017   Procedure: COLONOSCOPY WITH PROPOFOL;  Surgeon: Daneil Dolin, MD;  Location: [facility];  Service: Endoscopy;  Laterality: N/A;  1:45pm  . ELBOW SURGERY Right   . SHOULDER SURGERY Left     Social History   Socioeconomic History  . Marital status: Divorced    Spouse name: Not on file  . Number of children: 2  . Years of education: 10  . Highest education level: 10th grade  Occupational History  . Not on file  Social Needs  . Financial resource strain: Not on file  . Food insecurity:    Worry: Never true    Inability: Never true  . Transportation needs:    Medical: No    Non-medical: No  Tobacco Use  . Smoking status: Current Every Day Smoker    Packs/day: 0.50    Years: 53.00    Pack years: 26.50    Types:  Cigarettes  . Smokeless tobacco: Never Used  Substance and Sexual Activity  . Alcohol use: Yes    Alcohol/week: 5.0 standard drinks    Types: 1 Cans of beer, 4 Shots of liquor per week  . Drug use: No  . Sexual activity: Not Currently    Birth control/protection: Surgical    Comment: hyst  Lifestyle  . Physical activity:    Days per week: 5 days    Minutes per session: 60 min  . Stress: Not at all  Relationships  . Social connections:    Talks on phone: More than three times a week    Gets together: More than three times a week    Attends religious service: Never    Active member of club or organization: No    Attends meetings of clubs or organizations: Never    Relationship status: Divorced  . Intimate partner violence:    Fear of current or ex partner: No    Emotionally abused: No    Physically abused: No    Forced sexual activity: Not on file  Other Topics Concern  . Not on file  Social History Narrative   Lives at home alone. Feels safe there. She has an alarm system. She has two children. Her son lives locally and her daughter  lives in [place]. She has four granddaughters. She also has two nieces that live locally that she has dinner with on occasion and they will sometimes accompany her to doctor's appointments. She works at the [employer] in [place] about 30 hours a week. She enjoys working and gets a of physical activity there. 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     Outpatient Encounter Medications as of 06/18/2018  Medication Sig  . albuterol (PROVENTIL HFA;VENTOLIN HFA) 108 (90 Base) MCG/ACT inhaler Inhale 2 puffs into the lungs every 6 (six) hours as needed for wheezing or shortness of breath.  Marland Kitchen amLODipine (NORVASC) 5 MG tablet Take 1 tablet (5 mg total) by mouth daily.  Marland Kitchen aspirin EC 81 MG tablet Take 1 tablet (81 mg total) by mouth daily.  . brinzolamide (AZOPT) 1 % ophthalmic suspension Place 1 drop into both eyes 2 (two) times daily.   . Fluticasone-Salmeterol (ADVAIR) 250-50 MCG/DOSE AEPB USE 1 INHALATION TWICE DAILY  . [START ON 08/11/2018] HYDROcodone-acetaminophen (NORCO) 5-325 MG tablet Take 1 tablet by mouth 2 (two) times daily as needed for moderate pain.  Derrill Memo ON 07/11/2018] HYDROcodone-acetaminophen (NORCO) 5-325 MG tablet Take 1 tablet by mouth 2 (two) times daily as needed for moderate pain.  Marland Kitchen HYDROcodone-acetaminophen (NORCO/VICODIN) 5-325 MG tablet Take 1 tablet by mouth 2 (two) times daily as needed for moderate pain.  Marland Kitchen latanoprost (XALATAN) 0.005 % ophthalmic solution Place 1 drop into both eyes at bedtime.  Marland Kitchen lisinopril (PRINIVIL,ZESTRIL) 40 MG tablet TAKE ONE (1) TABLET EACH DAY  . Multiple Vitamins-Minerals (MULTIVITAMIN WITH MINERALS) tablet Take 1 tablet by mouth daily.  Marland Kitchen omeprazole (PRILOSEC) 20 MG capsule Take 1 capsule (20 mg total) by mouth daily as needed.  . pravastatin (PRAVACHOL) 40 MG tablet Take 1 tablet (40 mg total) by mouth daily.   No facility-administered encounter medications on file as of 06/18/2018.     No Known Allergies  Review of Systems  Constitutional: Negative for chills, fatigue and fever.  Respiratory: Negative for cough and shortness of breath.   Cardiovascular: Negative for chest pain and palpitations.  Skin: Positive for wound.  Neurological: Negative for dizziness, tremors, weakness, light-headedness, numbness and headaches.  Psychiatric/Behavioral: Negative for confusion.  All other systems reviewed and are negative.       Objective:  BP 130/77   Pulse 88   Temp 97.8 F (36.6 C) (Oral)   Ht '5\' 5"'$  (1.651 m)   Wt 121 lb (54.9 kg)   BMI 20.14 kg/m    Wt Readings from Last 3 Encounters:  07/09/18 124 lb (56.2 kg)  06/18/18 121 lb (54.9 kg)  06/11/18 123 lb (55.8 kg)    Physical Exam Vitals signs and nursing note reviewed.  Constitutional:      General: She is not in acute distress.    Appearance: Normal appearance. She is not ill-appearing or  toxic-appearing.  HENT:     Head: Normocephalic and atraumatic.     Mouth/Throat:     Mouth: Mucous membranes are moist.     Pharynx: Oropharynx is clear.  Eyes:     Conjunctiva/sclera: Conjunctivae normal.     Pupils: Pupils are equal, round, and reactive to light.  Neck:     Musculoskeletal: Normal range of motion and neck supple.  Cardiovascular:     Rate and Rhythm: Normal rate.     Pulses: Normal pulses.     Heart sounds: Normal heart sounds. No murmur. No friction rub. No  gallop.   Pulmonary:     Effort: Pulmonary effort is normal. No respiratory distress.     Breath sounds: Normal breath sounds.  Skin:    General: Skin is warm and dry.     Capillary Refill: Capillary refill takes less than 2 seconds.       Neurological:     General: No focal deficit present.     Mental Status: She is alert and oriented to person, place, and time.  Psychiatric:        Mood and Affect: Mood normal.        Behavior: Behavior normal.        Thought Content: Thought content normal.        Judgment: Judgment normal.     Results for orders placed or performed in visit on 06/11/18  CBC with Differential/Platelet  Result Value Ref Range   WBC 6.9 3.4 - 10.8 x10E3/uL   RBC 4.19 3.77 - 5.28 x10E6/uL   Hemoglobin 12.9 11.1 - 15.9 g/dL   Hematocrit 38.7 34.0 - 46.6 %   MCV 92 79 - 97 fL   MCH 30.8 26.6 - 33.0 pg   MCHC 33.3 31.5 - 35.7 g/dL   RDW 12.5 11.7 - 15.4 %   Platelets 493 (H) 150 - 450 x10E3/uL   Neutrophils 75 Not Estab. %   Lymphs 11 Not Estab. %   Monocytes 13 Not Estab. %   Eos 1 Not Estab. %   Basos 0 Not Estab. %   Neutrophils Absolute 5.2 1.4 - 7.0 x10E3/uL   Lymphocytes Absolute 0.7 0.7 - 3.1 x10E3/uL   Monocytes Absolute 0.9 0.1 - 0.9 x10E3/uL   EOS (ABSOLUTE) 0.1 0.0 - 0.4 x10E3/uL   Basophils Absolute 0.0 0.0 - 0.2 x10E3/uL   Immature Granulocytes 0 Not Estab. %   Immature Grans (Abs) 0.0 0.0 - 0.1 x10E3/uL  CMP14+EGFR  Result Value Ref Range   Glucose 95 65 -  99 mg/dL   BUN 10 8 - 27 mg/dL   Creatinine, Ser 0.66 0.57 - 1.00 mg/dL   GFR calc non Af Amer 87 >59 mL/min/1.73   GFR calc Af Amer 101 >59 mL/min/1.73   BUN/Creatinine Ratio 15 12 - 28   Sodium 133 (L) 134 - 144 mmol/L   Potassium 5.0 3.5 - 5.2 mmol/L   Chloride 95 (L) 96 - 106 mmol/L   CO2 23 20 - 29 mmol/L   Calcium 10.1 8.7 - 10.3 mg/dL   Total Protein 6.6 6.0 - 8.5 g/dL   Albumin 4.8 (H) 3.7 - 4.7 g/dL   Globulin, Total 1.8 1.5 - 4.5 g/dL   Albumin/Globulin Ratio 2.7 (H) 1.2 - 2.2   Bilirubin Total 0.5 0.0 - 1.2 mg/dL   Alkaline Phosphatase 68 39 - 117 IU/L   AST 20 0 - 40 IU/L   ALT 11 0 - 32 IU/L  Lipid panel  Result Value Ref Range   Cholesterol, Total 202 (H) 100 - 199 mg/dL   Triglycerides 46 0 - 149 mg/dL   HDL 121 >39 mg/dL   VLDL Cholesterol Cal 9 5 - 40 mg/dL   LDL Calculated 72 0 - 99 mg/dL   Chol/HDL Ratio 1.7 0.0 - 4.4 ratio  TSH  Result Value Ref Range   TSH 0.936 0.450 - 4.500 uIU/mL  Microalbumin / creatinine urine ratio  Result Value Ref Range   Creatinine, Urine 30.8 Not Estab. mg/dL   Microalbumin, Urine <3.0 Not Estab. ug/mL   Microalb/Creat Ratio <10  0 - 29 mg/g creat  ToxASSURE Select 13 (MW), Urine  Result Value Ref Range   Summary FINAL        Pertinent labs & imaging results that were available during my care of the patient were reviewed by me and considered in my medical decision making.  Assessment & Plan:  Samantha Huerta was seen today for 1 week recheck.  Diagnoses and all orders for this visit:  Laceration of left forearm, subsequent encounter Wet to dry dressing. No further need to pack wound. Wound care discussed. Return in 2 weeks for reevaluation. Report any new or worsening symptoms.     Continue all other maintenance medications.  Follow up plan: Return in about 2 weeks (around 07/02/2018), or if symptoms worsen or fail to improve, for wound recheck.  Educational handout given for wound care  The above assessment and  management plan was discussed with the patient. The patient verbalized understanding of and has agreed to the management plan. Patient is aware to call the clinic if symptoms persist or worsen. Patient is aware when to return to the clinic for a follow-up visit. Patient educated on when it is appropriate to go to the emergency department.   Monia Pouch, FNP-C [facility] [phone]

## 2018-06-18 NOTE — Patient Instructions (Signed)
Wound Care, Adult  Taking care of your wound properly can help to prevent pain, infection, and scarring. It can also help your wound to heal more quickly.  How to care for your wound  Wound care          Follow instructions from your health care provider about how to take care of your wound. Make sure you:  ? Wash your hands with soap and water before you change the bandage (dressing). If soap and water are not available, use hand sanitizer.  ? Change your dressing as told by your health care provider.  ? Leave stitches (sutures), skin glue, or adhesive strips in place. These skin closures may need to stay in place for 2 weeks or longer. If adhesive strip edges start to loosen and curl up, you may trim the loose edges. Do not remove adhesive strips completely unless your health care provider tells you to do that.   Check your wound area every day for signs of infection. Check for:  ? Redness, swelling, or pain.  ? Fluid or blood.  ? Warmth.  ? Pus or a bad smell.   Ask your health care provider if you should clean the wound with mild soap and water. Doing this may include:  ? Using a clean towel to pat the wound dry after cleaning it. Do not rub or scrub the wound.  ? Applying a cream or ointment. Do this only as told by your health care provider.  ? Covering the incision with a clean dressing.   Ask your health care provider when you can leave the wound uncovered.   Keep the dressing dry until your health care provider says it can be removed. Do not take baths, swim, use a hot tub, or do anything that would put the wound underwater until your health care provider approves. Ask your health care provider if you can take showers. You may only be allowed to take sponge baths.  Medicines     If you were prescribed an antibiotic medicine, cream, or ointment, take or use the antibiotic as told by your health care provider. Do not stop taking or using the antibiotic even if your condition improves.   Take  over-the-counter and prescription medicines only as told by your health care provider. If you were prescribed pain medicine, take it 30 or more minutes before you do any wound care or as told by your health care provider.  General instructions   Return to your normal activities as told by your health care provider. Ask your health care provider what activities are safe.   Do not scratch or pick at the wound.   Do not use any products that contain nicotine or tobacco, such as cigarettes and e-cigarettes. These may delay wound healing. If you need help quitting, ask your health care provider.   Keep all follow-up visits as told by your health care provider. This is important.   Eat a diet that includes protein, vitamin A, vitamin C, and other nutrient-rich foods to help the wound heal.  ? Foods rich in protein include meat, dairy, beans, nuts, and other sources.  ? Foods rich in vitamin A include carrots and dark green, leafy vegetables.  ? Foods rich in vitamin C include citrus, tomatoes, and other fruits and vegetables.  ? Nutrient-rich foods have protein, carbohydrates, fat, vitamins, or minerals. Eat a variety of healthy foods including vegetables, fruits, and whole grains.  Contact a health care provider if:     You received a tetanus shot and you have swelling, severe pain, redness, or bleeding at the injection site.   Your pain is not controlled with medicine.   You have redness, swelling, or pain around the wound.   You have fluid or blood coming from the wound.   Your wound feels warm to the touch.   You have pus or a bad smell coming from the wound.   You have a fever or chills.   You are nauseous or you vomit.   You are dizzy.  Get help right away if:   You have a red streak going away from your wound.   The edges of the wound open up and separate.   Your wound is bleeding, and the bleeding does not stop with gentle pressure.   You have a rash.   You faint.   You have trouble  breathing.  Summary   Always wash your hands with soap and water before changing your bandage (dressing).   To help with healing, eat foods that are rich in protein, vitamin A, vitamin C, and other nutrients.   Check your wound every day for signs of infection. Contact your health care provider if you suspect that your wound is infected.  This information is not intended to replace advice given to you by your health care provider. Make sure you discuss any questions you have with your health care provider.  Document Released: 12/05/2007 Document Revised: 04/08/2017 Document Reviewed: 09/12/2015  Elsevier Interactive Patient Education  2019 [employer]

## 2018-07-01 ENCOUNTER — Other Ambulatory Visit: Payer: Self-pay

## 2018-07-01 ENCOUNTER — Ambulatory Visit (HOSPITAL_COMMUNITY)
Admission: RE | Admit: 2018-07-01 | Discharge: 2018-07-01 | Disposition: A | Discharge status: Home / Self Care | Payer: Medicare Other | Source: Ambulatory Visit | Attending: Family Medicine | Admitting: Family Medicine

## 2018-07-01 DIAGNOSIS — R918 Other nonspecific abnormal finding of lung field: Secondary | ICD-10-CM | POA: Diagnosis not present

## 2018-07-01 DIAGNOSIS — R911 Solitary pulmonary nodule: Secondary | ICD-10-CM | POA: Insufficient documentation

## 2018-07-01 NOTE — Addendum Note (Signed)
Addended by: Sonny Masters on: 07/01/2018 01:49 PM   Modules accepted: Orders

## 2018-07-03 ENCOUNTER — Ambulatory Visit: Payer: Medicare Other | Admitting: Family Medicine

## 2018-07-09 ENCOUNTER — Other Ambulatory Visit: Payer: Self-pay

## 2018-07-09 ENCOUNTER — Encounter: Payer: Self-pay | Admitting: Family Medicine

## 2018-07-09 ENCOUNTER — Ambulatory Visit (INDEPENDENT_AMBULATORY_CARE_PROVIDER_SITE_OTHER): Payer: Medicare Other | Admitting: Family Medicine

## 2018-07-09 VITALS — BP 128/70 | HR 95 | Temp 98.6°F | Ht 65.0 in | Wt 124.0 lb

## 2018-07-09 DIAGNOSIS — R911 Solitary pulmonary nodule: Secondary | ICD-10-CM

## 2018-07-09 DIAGNOSIS — S51812D Laceration without foreign body of left forearm, subsequent encounter: Secondary | ICD-10-CM | POA: Diagnosis not present

## 2018-07-09 DIAGNOSIS — R918 Other nonspecific abnormal finding of lung field: Secondary | ICD-10-CM | POA: Diagnosis not present

## 2018-07-09 NOTE — Progress Notes (Signed)
Subjective:  Patient ID: Samantha Huerta, female    DOB: 12/01/1945, 73 y.o.   MRN: [id number]  Chief Complaint:  Wound Check (laceration left arm)   HPI: Samantha Huerta is a 73 y.o. female presenting on 07/09/2018 for Wound Check (laceration left arm)   1. Laceration of left forearm, subsequent encounter  Pt presents today for laceration recheck. Pt has been cleaning and dressing wound twice daily at home. Pt denies redness, swelling, drainage, fever, chills, or confusion. Pt states her arm feels much better. Denies pain.    2. Lung nodule  Lung CT on 07/01/2018 revealed a new left upper lobe 4 mm nodule. Pt denies cough, shortness of breath, fever, chills, nights sweats, or weight loss. No fatigue. Pt states she has cut back on smoking, states she is down to 1/2 PPD.      Relevant past medical, surgical, family, and social history reviewed and updated as indicated.  Allergies and medications reviewed and updated.   Past Medical History:  Diagnosis Date  . Anxiety   . Arthritis   . COPD (chronic obstructive pulmonary disease) ([place])   . GERD (gastroesophageal reflux disease)   . Glaucoma 1998   Dr Sharen Counter in [facility]  . Hyperlipidemia   . Hypertension   . Osteopenia     Past Surgical History:  Procedure Laterality Date  . ABDOMINAL HYSTERECTOMY    . CARPAL TUNNEL RELEASE Bilateral   . COLONOSCOPY WITH PROPOFOL N/A 10/20/2017   Procedure: COLONOSCOPY WITH PROPOFOL;  Surgeon: Daneil Dolin, MD;  Location: [facility];  Service: Endoscopy;  Laterality: N/A;  1:45pm  . ELBOW SURGERY Right   . SHOULDER SURGERY Left     Social History   Socioeconomic History  . Marital status: Divorced    Spouse name: Not on file  . Number of children: 2  . Years of education: 10  . Highest education level: 10th grade  Occupational History  . Not on file  Social Needs  . Financial resource strain: Not on file  . Food insecurity:    Worry: Never true    Inability: Never  true  . Transportation needs:    Medical: No    Non-medical: No  Tobacco Use  . Smoking status: Current Every Day Smoker    Packs/day: 0.50    Years: 53.00    Pack years: 26.50    Types: Cigarettes  . Smokeless tobacco: Never Used  Substance and Sexual Activity  . Alcohol use: Yes    Alcohol/week: 5.0 standard drinks    Types: 1 Cans of beer, 4 Shots of liquor per week  . Drug use: No  . Sexual activity: Not Currently    Birth control/protection: Surgical    Comment: hyst  Lifestyle  . Physical activity:    Days per week: 5 days    Minutes per session: 60 min  . Stress: Not at all  Relationships  . Social connections:    Talks on phone: More than three times a week    Gets together: More than three times a week    Attends religious service: Never    Active member of club or organization: No    Attends meetings of clubs or organizations: Never    Relationship status: Divorced  . Intimate partner violence:    Fear of current or ex partner: No    Emotionally abused: No    Physically abused: No    Forced sexual activity: Not on  file  Other Topics Concern  . Not on file  Social History Narrative   Lives at home alone. Feels safe there. She has an alarm system. She has two children. Her son lives locally and her daughter lives in [place]. She has four granddaughters. She also has two nieces that live locally that she has dinner with on occasion and they will sometimes accompany her to doctor's appointments. She works at the [employer] in [place] about 30 hours a week. She enjoys working and gets a of physical activity there. 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     Outpatient Encounter Medications as of 07/09/2018  Medication Sig  . albuterol (PROVENTIL HFA;VENTOLIN HFA) 108 (90 Base) MCG/ACT inhaler Inhale 2 puffs into the lungs every 6 (six) hours as needed for wheezing or shortness of breath.  Marland Kitchen amLODipine (NORVASC) 5 MG tablet Take 1 tablet (5 mg  total) by mouth daily.  Marland Kitchen aspirin EC 81 MG tablet Take 1 tablet (81 mg total) by mouth daily.  . brinzolamide (AZOPT) 1 % ophthalmic suspension Place 1 drop into both eyes 2 (two) times daily.  . Fluticasone-Salmeterol (ADVAIR) 250-50 MCG/DOSE AEPB USE 1 INHALATION TWICE DAILY  . [START ON 08/11/2018] HYDROcodone-acetaminophen (NORCO) 5-325 MG tablet Take 1 tablet by mouth 2 (two) times daily as needed for moderate pain.  Derrill Memo ON 07/11/2018] HYDROcodone-acetaminophen (NORCO) 5-325 MG tablet Take 1 tablet by mouth 2 (two) times daily as needed for moderate pain.  Marland Kitchen HYDROcodone-acetaminophen (NORCO/VICODIN) 5-325 MG tablet Take 1 tablet by mouth 2 (two) times daily as needed for moderate pain.  Marland Kitchen latanoprost (XALATAN) 0.005 % ophthalmic solution Place 1 drop into both eyes at bedtime.  Marland Kitchen lisinopril (PRINIVIL,ZESTRIL) 40 MG tablet TAKE ONE (1) TABLET EACH DAY  . Multiple Vitamins-Minerals (MULTIVITAMIN WITH MINERALS) tablet Take 1 tablet by mouth daily.  Marland Kitchen omeprazole (PRILOSEC) 20 MG capsule Take 1 capsule (20 mg total) by mouth daily as needed.  . pravastatin (PRAVACHOL) 40 MG tablet Take 1 tablet (40 mg total) by mouth daily.   No facility-administered encounter medications on file as of 07/09/2018.     No Known Allergies  Review of Systems  Constitutional: Negative for activity change, appetite change, chills, diaphoresis, fatigue, fever and unexpected weight change.  Respiratory: Negative for cough, chest tightness, shortness of breath and wheezing.   Cardiovascular: Negative for chest pain, palpitations and leg swelling.  Musculoskeletal: Negative for back pain.  Skin: Positive for wound.  Neurological: Negative for dizziness, syncope, weakness, light-headedness and headaches.  Psychiatric/Behavioral: Negative for confusion.  All other systems reviewed and are negative.       Objective:  BP 128/70   Pulse 95   Temp 98.6 F (37 C)   Ht _0  (1.651 m)   Wt 124 lb (56.2 kg)   BMI  20.63 kg/m    Wt Readings from Last 3 Encounters:  07/09/18 124 lb (56.2 kg)  06/18/18 121 lb (54.9 kg)  06/11/18 123 lb (55.8 kg)    Physical Exam Vitals signs and nursing note reviewed.  Constitutional:      General: She is not in acute distress.    Appearance: Normal appearance. She is not ill-appearing or toxic-appearing.  HENT:     Head: Normocephalic and atraumatic.     Nose: Nose normal.     Mouth/Throat:     Mouth: Mucous membranes are moist.     Pharynx: Oropharynx is clear.  Cardiovascular:  Rate and Rhythm: Normal rate and regular rhythm.     Heart sounds: Normal heart sounds.  Pulmonary:     Effort: Pulmonary effort is normal. No respiratory distress.     Breath sounds: Normal breath sounds. No rhonchi.  Skin:    General: Skin is warm and dry.     Capillary Refill: Capillary refill takes less than 2 seconds.     Findings: Wound present.       Neurological:     General: No focal deficit present.     Mental Status: She is alert and oriented to person, place, and time.  Psychiatric:        Mood and Affect: Mood normal.        Behavior: Behavior normal.        Thought Content: Thought content normal.        Judgment: Judgment normal.       Results for orders placed or performed in visit on 06/11/18  CBC with Differential/Platelet  Result Value Ref Range   WBC 6.9 3.4 - 10.8 x10E3/uL   RBC 4.19 3.77 - 5.28 x10E6/uL   Hemoglobin 12.9 11.1 - 15.9 g/dL   Hematocrit 38.7 34.0 - 46.6 %   MCV 92 79 - 97 fL   MCH 30.8 26.6 - 33.0 pg   MCHC 33.3 31.5 - 35.7 g/dL   RDW 12.5 11.7 - 15.4 %   Platelets 493 (H) 150 - 450 x10E3/uL   Neutrophils 75 Not Estab. %   Lymphs 11 Not Estab. %   Monocytes 13 Not Estab. %   Eos 1 Not Estab. %   Basos 0 Not Estab. %   Neutrophils Absolute 5.2 1.4 - 7.0 x10E3/uL   Lymphocytes Absolute 0.7 0.7 - 3.1 x10E3/uL   Monocytes Absolute 0.9 0.1 - 0.9 x10E3/uL   EOS (ABSOLUTE) 0.1 0.0 - 0.4 x10E3/uL   Basophils Absolute 0.0  0.0 - 0.2 x10E3/uL   Immature Granulocytes 0 Not Estab. %   Immature Grans (Abs) 0.0 0.0 - 0.1 x10E3/uL  CMP14+EGFR  Result Value Ref Range   Glucose 95 65 - 99 mg/dL   BUN 10 8 - 27 mg/dL   Creatinine, Ser 0.66 0.57 - 1.00 mg/dL   GFR calc non Af Amer 87 >59 mL/min/1.73   GFR calc Af Amer 101 >59 mL/min/1.73   BUN/Creatinine Ratio 15 12 - 28   Sodium 133 (L) 134 - 144 mmol/L   Potassium 5.0 3.5 - 5.2 mmol/L   Chloride 95 (L) 96 - 106 mmol/L   CO2 23 20 - 29 mmol/L   Calcium 10.1 8.7 - 10.3 mg/dL   Total Protein 6.6 6.0 - 8.5 g/dL   Albumin 4.8 (H) 3.7 - 4.7 g/dL   Globulin, Total 1.8 1.5 - 4.5 g/dL   Albumin/Globulin Ratio 2.7 (H) 1.2 - 2.2   Bilirubin Total 0.5 0.0 - 1.2 mg/dL   Alkaline Phosphatase 68 39 - 117 IU/L   AST 20 0 - 40 IU/L   ALT 11 0 - 32 IU/L  Lipid panel  Result Value Ref Range   Cholesterol, Total 202 (H) 100 - 199 mg/dL   Triglycerides 46 0 - 149 mg/dL   HDL 121 >39 mg/dL   VLDL Cholesterol Cal 9 5 - 40 mg/dL   LDL Calculated 72 0 - 99 mg/dL   Chol/HDL Ratio 1.7 0.0 - 4.4 ratio  TSH  Result Value Ref Range   TSH 0.936 0.450 - 4.500 uIU/mL  Microalbumin / creatinine urine ratio  Result Value Ref Range   Creatinine, Urine 30.8 Not Estab. mg/dL   Microalbumin, Urine <3.0 Not Estab. ug/mL   Microalb/Creat Ratio <10 0 - 29 mg/g creat  ToxASSURE Select 13 (MW), Urine  Result Value Ref Range   Summary FINAL        Pertinent labs & imaging results that were available during my care of the patient were reviewed by me and considered in my medical decision making.  Assessment & Plan:  Floyce was seen today for wound check.  Diagnoses and all orders for this visit:  Laceration of left forearm, subsequent encounter Wound care as discussed. Report any new or worsening symptoms.   Lung nodule Discussed CT results and need for follow up scan in 12 months. Will need repeat CT in 06/2019 unless new symptoms arise.     Continue all other maintenance  medications.  Follow up plan: Return if symptoms worsen or fail to improve.   The above assessment and management plan was discussed with the patient. The patient verbalized understanding of and has agreed to the management plan. Patient is aware to call the clinic if symptoms persist or worsen. Patient is aware when to return to the clinic for a follow-up visit. Patient educated on when it is appropriate to go to the emergency department.   Monia Pouch, FNP-C [facility] [phone]

## 2018-09-02 DIAGNOSIS — Z961 Presence of intraocular lens: Secondary | ICD-10-CM | POA: Diagnosis not present

## 2018-09-02 DIAGNOSIS — H1851 Endothelial corneal dystrophy: Secondary | ICD-10-CM | POA: Diagnosis not present

## 2018-09-02 DIAGNOSIS — H401131 Primary open-angle glaucoma, bilateral, mild stage: Secondary | ICD-10-CM | POA: Diagnosis not present

## 2018-09-15 ENCOUNTER — Ambulatory Visit (INDEPENDENT_AMBULATORY_CARE_PROVIDER_SITE_OTHER): Payer: Medicare Other | Admitting: Family Medicine

## 2018-09-15 ENCOUNTER — Other Ambulatory Visit: Payer: Self-pay

## 2018-09-15 ENCOUNTER — Encounter: Payer: Self-pay | Admitting: Family Medicine

## 2018-09-15 VITALS — BP 132/80 | HR 99 | Temp 97.0°F | Ht 65.0 in | Wt 126.4 lb

## 2018-09-15 DIAGNOSIS — E871 Hypo-osmolality and hyponatremia: Secondary | ICD-10-CM | POA: Diagnosis not present

## 2018-09-15 DIAGNOSIS — M545 Low back pain: Secondary | ICD-10-CM

## 2018-09-15 DIAGNOSIS — E782 Mixed hyperlipidemia: Secondary | ICD-10-CM | POA: Diagnosis not present

## 2018-09-15 DIAGNOSIS — J449 Chronic obstructive pulmonary disease, unspecified: Secondary | ICD-10-CM | POA: Diagnosis not present

## 2018-09-15 DIAGNOSIS — G8929 Other chronic pain: Secondary | ICD-10-CM | POA: Diagnosis not present

## 2018-09-15 DIAGNOSIS — I10 Essential (primary) hypertension: Secondary | ICD-10-CM | POA: Diagnosis not present

## 2018-09-15 MED ORDER — PRAVASTATIN SODIUM 40 MG PO TABS: 40.0000 mg | ORAL_TABLET | Freq: Every day | ORAL | 1 refills | Status: DC

## 2018-09-15 MED ORDER — HYDROCODONE-ACETAMINOPHEN 5-325 MG PO TABS: 1.0000 | ORAL_TABLET | Freq: Two times a day (BID) | ORAL | 0 refills | Status: DC | PRN

## 2018-09-15 MED ORDER — LISINOPRIL 40 MG PO TABS: ORAL_TABLET | ORAL | 1 refills | Status: DC

## 2018-09-15 MED ORDER — NALOXONE HCL 4 MG/0.1ML NA LIQD: NASAL | 3 refills | Status: DC

## 2018-09-15 NOTE — Progress Notes (Signed)
Subjective:  Patient ID: Samantha Huerta, female    DOB: 01-30-1946, 73 y.o.   MRN: [id number]  Chief Complaint:  Medical Management of Chronic Issues (3 month follow up ), Hypertension, and Pain   HPI: Samantha Huerta is a 73 y.o. female presenting on 09/15/2018 for Medical Management of Chronic Issues (3 month follow up ), Hypertension, and Pain   1. Chronic midline low back pain without sciatica  Pain assessment: Cause of pain- arthritis Pain location- low back and right wrist Pain on scale of 1-10- 6/10 Frequency- daily What increases pain- activity and certain movements What makes pain Better-medications and rest Effects on ADL - minimal Any change in general medical condition-no  Current opioids rx- Hydrocodone 5/325 # meds rx- #45 Effectiveness of current meds-good Adverse reactions form pain meds-none Morphine equivalent- 10 MEDD  Pill count performed-Yes Last drug screen - 06/11/2018 Urine drug screen today- No Was the [facility] reviewed- yes  If yes were their any concerning findings? - no  Opioid Risk  09/15/2018  Alcohol 0  Illegal Drugs 0  Rx Drugs 0  Alcohol 3  Illegal Drugs 0  Rx Drugs 0  Age between 16-45 years  0  History of Preadolescent Sexual Abuse 0  Psychological Disease 0  Depression 0  Opioid Risk Tool Scoring 3  Opioid Risk Interpretation Low Risk   Pain contract signed on: 06/11/2018   2. Essential hypertension  Complaint with meds - Yes Checking BP at home - No Exercising Regularly - No Watching Salt intake - Yes Pertinent ROS:  Headache - No Chest pain - No Dyspnea - No Palpitations - No LE edema - No They report good compliance with medications and can restate their regimen by memory. No medication side effects.  BP Readings from Last 3 Encounters:  09/15/18 132/80  07/09/18 128/70  06/18/18 130/77     3. COPD (chronic obstructive pulmonary disease) with chronic bronchitis (HCC)  Well controlled on current medications.  Does have some exertional shortness of breath. No resting shortness of breath, fatigue, increased cough, or increased sputum production.    4. Mixed hyperlipidemia  Compliant with medications without associated side effects. Does not watch diet or exercise on a regular basis.      Relevant past medical, surgical, family, and social history reviewed and updated as indicated.  Allergies and medications reviewed and updated.   Past Medical History:  Diagnosis Date  . Anxiety   . Arthritis   . COPD (chronic obstructive pulmonary disease) ([place])   . GERD (gastroesophageal reflux disease)   . Glaucoma 1998   Dr Sharen Counter in [facility]  . Hyperlipidemia   . Hypertension   . Osteopenia     Past Surgical History:  Procedure Laterality Date  . ABDOMINAL HYSTERECTOMY    . CARPAL TUNNEL RELEASE Bilateral   . COLONOSCOPY WITH PROPOFOL N/A 10/20/2017   Procedure: COLONOSCOPY WITH PROPOFOL;  Surgeon: Daneil Dolin, MD;  Location: [facility];  Service: Endoscopy;  Laterality: N/A;  1:45pm  . ELBOW SURGERY Right   . SHOULDER SURGERY Left     Social History   Socioeconomic History  . Marital status: Divorced    Spouse name: Not on file  . Number of children: 2  . Years of education: 10  . Highest education level: 10th grade  Occupational History  . Not on file  Social Needs  . Financial resource strain: Not on file  . Food insecurity    Worry:  Never true    Inability: Never true  . Transportation needs    Medical: No    Non-medical: No  Tobacco Use  . Smoking status: Current Every Day Smoker    Packs/day: 0.50    Years: 53.00    Pack years: 26.50    Types: Cigarettes  . Smokeless tobacco: Never Used  Substance and Sexual Activity  . Alcohol use: Yes    Alcohol/week: 5.0 standard drinks    Types: 1 Cans of beer, 4 Shots of liquor per week  . Drug use: No  . Sexual activity: Not Currently    Birth control/protection: Surgical    Comment: hyst  Lifestyle  . Physical  activity    Days per week: 5 days    Minutes per session: 60 min  . Stress: Not at all  Relationships  . Social connections    Talks on phone: More than three times a week    Gets together: More than three times a week    Attends religious service: Never    Active member of club or organization: No    Attends meetings of clubs or organizations: Never    Relationship status: Divorced  . Intimate partner violence    Fear of current or ex partner: No    Emotionally abused: No    Physically abused: No    Forced sexual activity: Not on file  Other Topics Concern  . Not on file  Social History Narrative   Lives at home alone. Feels safe there. She has an alarm system. She has two children. Her son lives locally and her daughter lives in [place]. She has four granddaughters. She also has two nieces that live locally that she has dinner with on occasion and they will sometimes accompany her to doctor's appointments. She works at the [employer] in [place] about 30 hours a week. She enjoys working and gets a of physical activity there. 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     Outpatient Encounter Medications as of 09/15/2018  Medication Sig  . albuterol (PROVENTIL HFA;VENTOLIN HFA) 108 (90 Base) MCG/ACT inhaler Inhale 2 puffs into the lungs every 6 (six) hours as needed for wheezing or shortness of breath.  Marland Kitchen amLODipine (NORVASC) 5 MG tablet Take 1 tablet (5 mg total) by mouth daily.  Marland Kitchen aspirin EC 81 MG tablet Take 1 tablet (81 mg total) by mouth daily.  . brinzolamide (AZOPT) 1 % ophthalmic suspension Place 1 drop into both eyes 2 (two) times daily.  . Fluticasone-Salmeterol (ADVAIR) 250-50 MCG/DOSE AEPB USE 1 INHALATION TWICE DAILY  . [START ON 11/16/2018] HYDROcodone-acetaminophen (NORCO) 5-325 MG tablet Take 1 tablet by mouth 2 (two) times daily as needed for moderate pain.  Derrill Memo ON 10/16/2018] HYDROcodone-acetaminophen (NORCO) 5-325 MG tablet Take 1 tablet by mouth  2 (two) times daily as needed for moderate pain.  Marland Kitchen HYDROcodone-acetaminophen (NORCO/VICODIN) 5-325 MG tablet Take 1 tablet by mouth 2 (two) times daily as needed for moderate pain.  Marland Kitchen latanoprost (XALATAN) 0.005 % ophthalmic solution Place 1 drop into both eyes at bedtime.  Marland Kitchen lisinopril (ZESTRIL) 40 MG tablet TAKE ONE (1) TABLET EACH DAY  . Multiple Vitamins-Minerals (MULTIVITAMIN WITH MINERALS) tablet Take 1 tablet by mouth daily.  Marland Kitchen omeprazole (PRILOSEC) 20 MG capsule Take 1 capsule (20 mg total) by mouth daily as needed.  . pravastatin (PRAVACHOL) 40 MG tablet Take 1 tablet (40 mg total) by mouth daily.  . [DISCONTINUED] HYDROcodone-acetaminophen ([place]) 5-325  MG tablet Take 1 tablet by mouth 2 (two) times daily as needed for moderate pain.  . [DISCONTINUED] HYDROcodone-acetaminophen (NORCO) 5-325 MG tablet Take 1 tablet by mouth 2 (two) times daily as needed for moderate pain.  . [DISCONTINUED] HYDROcodone-acetaminophen (NORCO/VICODIN) 5-325 MG tablet Take 1 tablet by mouth 2 (two) times daily as needed for moderate pain.  . [DISCONTINUED] lisinopril (PRINIVIL,ZESTRIL) 40 MG tablet TAKE ONE (1) TABLET EACH DAY  . [DISCONTINUED] pravastatin (PRAVACHOL) 40 MG tablet Take 1 tablet (40 mg total) by mouth daily.   No facility-administered encounter medications on file as of 09/15/2018.     No Known Allergies  Review of Systems  Constitutional: Negative for activity change, appetite change, chills, fatigue and fever.  HENT: Negative.   Eyes: Negative.  Negative for photophobia and visual disturbance.  Respiratory: Positive for cough (intermittent), shortness of breath (exertional, baseline) and wheezing. Negative for chest tightness.   Cardiovascular: Negative for chest pain, palpitations and leg swelling.  Gastrointestinal: Negative for blood in stool, constipation, diarrhea, nausea and vomiting.  Endocrine: Negative.  Negative for cold intolerance, heat intolerance, polydipsia, polyphagia and  polyuria.  Genitourinary: Negative for decreased urine volume, difficulty urinating, dysuria, frequency and urgency.  Musculoskeletal: Positive for arthralgias and back pain. Negative for myalgias.  Skin: Negative.   Allergic/Immunologic: Negative.   Neurological: Negative for dizziness, tremors, seizures, syncope, facial asymmetry, speech difficulty, weakness, light-headedness, numbness and headaches.  Hematological: Negative.  Does not bruise/bleed easily.  Psychiatric/Behavioral: Negative for confusion, hallucinations, sleep disturbance and suicidal ideas.  All other systems reviewed and are negative.       Objective:  BP 132/80   Pulse 99   Temp (!) 97 F (36.1 C) (Oral)   Ht _0  (1.651 m)   Wt 126 lb 6.4 oz (57.3 kg)   BMI 21.03 kg/m    Wt Readings from Last 3 Encounters:  09/15/18 126 lb 6.4 oz (57.3 kg)  07/09/18 124 lb (56.2 kg)  06/18/18 121 lb (54.9 kg)    Physical Exam Vitals signs and nursing note reviewed.  Constitutional:      General: She is not in acute distress.    Appearance: Normal appearance. She is well-developed and well-groomed. She is not ill-appearing, toxic-appearing or diaphoretic.  HENT:     Head: Normocephalic and atraumatic.     Jaw: There is normal jaw occlusion.     Right Ear: Hearing normal.     Left Ear: Hearing normal.     Nose: Nose normal.     Mouth/Throat:     Lips: Pink.     Mouth: Mucous membranes are moist.     Pharynx: Oropharynx is clear. Uvula midline.  Eyes:     General: Lids are normal.     Extraocular Movements: Extraocular movements intact.     Conjunctiva/sclera: Conjunctivae normal.     Pupils: Pupils are equal, round, and reactive to light.  Neck:     Musculoskeletal: Normal range of motion and neck supple.     Thyroid: No thyroid mass, thyromegaly or thyroid tenderness.     Vascular: No carotid bruit or JVD.     Trachea: Trachea and phonation normal.  Cardiovascular:     Rate and Rhythm: Normal rate and  regular rhythm.     Chest Wall: PMI is not displaced.     Pulses: Normal pulses.     Heart sounds: Normal heart sounds. No murmur. No friction rub. No gallop.   Pulmonary:     Effort: Pulmonary  effort is normal. No respiratory distress.     Breath sounds: Normal breath sounds. No wheezing.  Abdominal:     General: Bowel sounds are normal. There is no distension or abdominal bruit.     Palpations: Abdomen is soft. There is no hepatomegaly or splenomegaly.     Tenderness: There is no abdominal tenderness. There is no right CVA tenderness or left CVA tenderness.     Hernia: No hernia is present.  Musculoskeletal:     Right elbow: Normal.    Right wrist: She exhibits decreased range of motion and tenderness. She exhibits no bony tenderness, no swelling, no effusion, no crepitus, no deformity and no laceration.     Right hip: Normal.     Left hip: Normal.     Thoracic back: Normal.     Lumbar back: She exhibits decreased range of motion and tenderness. She exhibits no bony tenderness, no swelling, no edema, no deformity, no laceration, no pain, no spasm and normal pulse.     Right lower leg: No edema.     Left lower leg: No edema.  Lymphadenopathy:     Cervical: No cervical adenopathy.  Skin:    General: Skin is warm and dry.     Capillary Refill: Capillary refill takes less than 2 seconds.     Coloration: Skin is not cyanotic, jaundiced or pale.     Findings: No rash.  Neurological:     General: No focal deficit present.     Mental Status: She is alert and oriented to person, place, and time.     Cranial Nerves: Cranial nerves are intact.     Sensory: Sensation is intact.     Motor: Motor function is intact.     Coordination: Coordination is intact.     Gait: Gait is intact.     Deep Tendon Reflexes: Reflexes are normal and symmetric.  Psychiatric:        Attention and Perception: Attention and perception normal.        Mood and Affect: Mood and affect normal.        Speech:  Speech normal.        Behavior: Behavior normal. Behavior is cooperative.        Thought Content: Thought content normal.        Cognition and Memory: Cognition and memory normal.        Judgment: Judgment normal.     Results for orders placed or performed in visit on 06/11/18  CBC with Differential/Platelet  Result Value Ref Range   WBC 6.9 3.4 - 10.8 x10E3/uL   RBC 4.19 3.77 - 5.28 x10E6/uL   Hemoglobin 12.9 11.1 - 15.9 g/dL   Hematocrit 38.7 34.0 - 46.6 %   MCV 92 79 - 97 fL   MCH 30.8 26.6 - 33.0 pg   MCHC 33.3 31.5 - 35.7 g/dL   RDW 12.5 11.7 - 15.4 %   Platelets 493 (H) 150 - 450 x10E3/uL   Neutrophils 75 Not Estab. %   Lymphs 11 Not Estab. %   Monocytes 13 Not Estab. %   Eos 1 Not Estab. %   Basos 0 Not Estab. %   Neutrophils Absolute 5.2 1.4 - 7.0 x10E3/uL   Lymphocytes Absolute 0.7 0.7 - 3.1 x10E3/uL   Monocytes Absolute 0.9 0.1 - 0.9 x10E3/uL   EOS (ABSOLUTE) 0.1 0.0 - 0.4 x10E3/uL   Basophils Absolute 0.0 0.0 - 0.2 x10E3/uL   Immature Granulocytes 0 Not Estab. %  Immature Grans (Abs) 0.0 0.0 - 0.1 x10E3/uL  CMP14+EGFR  Result Value Ref Range   Glucose 95 65 - 99 mg/dL   BUN 10 8 - 27 mg/dL   Creatinine, Ser 0.66 0.57 - 1.00 mg/dL   GFR calc non Af Amer 87 >59 mL/min/1.73   GFR calc Af Amer 101 >59 mL/min/1.73   BUN/Creatinine Ratio 15 12 - 28   Sodium 133 (L) 134 - 144 mmol/L   Potassium 5.0 3.5 - 5.2 mmol/L   Chloride 95 (L) 96 - 106 mmol/L   CO2 23 20 - 29 mmol/L   Calcium 10.1 8.7 - 10.3 mg/dL   Total Protein 6.6 6.0 - 8.5 g/dL   Albumin 4.8 (H) 3.7 - 4.7 g/dL   Globulin, Total 1.8 1.5 - 4.5 g/dL   Albumin/Globulin Ratio 2.7 (H) 1.2 - 2.2   Bilirubin Total 0.5 0.0 - 1.2 mg/dL   Alkaline Phosphatase 68 39 - 117 IU/L   AST 20 0 - 40 IU/L   ALT 11 0 - 32 IU/L  Lipid panel  Result Value Ref Range   Cholesterol, Total 202 (H) 100 - 199 mg/dL   Triglycerides 46 0 - 149 mg/dL   HDL 121 >39 mg/dL   VLDL Cholesterol Cal 9 5 - 40 mg/dL   LDL Calculated  72 0 - 99 mg/dL   Chol/HDL Ratio 1.7 0.0 - 4.4 ratio  TSH  Result Value Ref Range   TSH 0.936 0.450 - 4.500 uIU/mL  Microalbumin / creatinine urine ratio  Result Value Ref Range   Creatinine, Urine 30.8 Not Estab. mg/dL   Microalbumin, Urine <3.0 Not Estab. ug/mL   Microalb/Creat Ratio <10 0 - 29 mg/g creat  ToxASSURE Select 13 (MW), Urine  Result Value Ref Range   Summary FINAL        Pertinent labs & imaging results that were available during my care of the patient were reviewed by me and considered in my medical decision making.  Assessment & Plan:  [place] was seen today for medical management of chronic issues, hypertension and pain.  Diagnoses and all orders for this visit:  Chronic midline low back pain without sciatica Chronic low back pain. Taking 1 - 1.5 tablets per day. This well controls pain. No constipation or fatigue. Narcan kit prescribed.  -     HYDROcodone-acetaminophen (NORCO) 5-325 MG tablet; Take 1 tablet by mouth 2 (two) times daily as needed for moderate pain. -     HYDROcodone-acetaminophen (NORCO) 5-325 MG tablet; Take 1 tablet by mouth 2 (two) times daily as needed for moderate pain. -     HYDROcodone-acetaminophen (NORCO/VICODIN) 5-325 MG tablet; Take 1 tablet by mouth 2 (two) times daily as needed for moderate pain.  Essential hypertension -     CMP14+EGFR -     lisinopril (ZESTRIL) 40 MG tablet; TAKE ONE (1) TABLET EACH DAY  COPD (chronic obstructive pulmonary disease) with chronic bronchitis (HCC) Well controlled on current medications. She does not need refills today. Report any new or worsening symptoms.   Mixed hyperlipidemia Diet and exercise encouraged. Continue medications as prescribed. Will recheck lipids at next visit.  -     pravastatin (PRAVACHOL) 40 MG tablet; Take 1 tablet (40 mg total) by mouth daily.     Continue all other maintenance medications.  Follow up plan: Return in about 3 months (around 12/16/2018), or if symptoms  worsen or fail to improve, for chronic pain, HTN.  Educational handout given for survey  The above  assessment and management plan was discussed with the patient. The patient verbalized understanding of and has agreed to the management plan. Patient is aware to call the clinic if symptoms persist or worsen. Patient is aware when to return to the clinic for a follow-up visit. Patient educated on when it is appropriate to go to the emergency department.   Monia Pouch, FNP-C [facility] [phone]

## 2018-09-15 NOTE — Patient Instructions (Signed)
It was a pleasure seeing you today, Samantha Huerta.  Information regarding what we discussed is included in this packet.  Please make an appointment to see me in 3 months.   In a few days you may receive a survey in the mail or online from [employer] regarding your visit with us today. Please take a moment to fill this out. Your feedback is very important to our office. It can help us better understand your needs as well as improve your experience and satisfaction. Thank you for taking your time to complete it. We care about you.  Because of recent events of COVID-19 ("Coronavirus"), please follow CDC recommendations:   1. Wash your hand frequently 2. Avoid touching your face 3. Stay away from people who are sick 4. If you have symptoms such as fever, cough, shortness of breath then call your healthcare provider for further guidance 5. If you are sick, STAY AT HOME, unless otherwise directed by your healthcare provider. 6. Follow directions from state and national officials regarding staying safe    Please feel free to call our office if any questions or concerns arise.  Warm Regards, Michelle Rakes, FNP-C [facility] [address] [place], [place] [address] [phone]       

## 2018-09-16 ENCOUNTER — Telehealth: Payer: Self-pay | Admitting: Family Medicine

## 2018-09-16 LAB — CMP14+EGFR
ALT: 17 IU/L (ref 0–32)
AST: 21 IU/L (ref 0–40)
Albumin/Globulin Ratio: 2.4 — ABNORMAL HIGH (ref 1.2–2.2)
Albumin: 4.6 g/dL (ref 3.7–4.7)
Alkaline Phosphatase: 74 IU/L (ref 39–117)
BUN/Creatinine Ratio: 15 (ref 12–28)
BUN: 11 mg/dL (ref 8–27)
Bilirubin Total: 0.5 mg/dL (ref 0.0–1.2)
CO2: 20 mmol/L (ref 20–29)
Calcium: 9.6 mg/dL (ref 8.7–10.3)
Chloride: 94 mmol/L — ABNORMAL LOW (ref 96–106)
Creatinine, Ser: 0.71 mg/dL (ref 0.57–1.00)
GFR calc Af Amer: 97 mL/min/{1.73_m2} (ref >59)
GFR calc non Af Amer: 84 mL/min/{1.73_m2} (ref >59)
Globulin, Total: 1.9 g/dL (ref 1.5–4.5)
Glucose: 84 mg/dL (ref 65–99)
Potassium: 4.2 mmol/L (ref 3.5–5.2)
Sodium: 129 mmol/L — ABNORMAL LOW (ref 134–144)
Total Protein: 6.5 g/dL (ref 6.0–8.5)

## 2018-09-16 NOTE — Addendum Note (Signed)
Addended by: Baruch Gouty on: 09/16/2018 08:46 AM   Modules accepted: Orders

## 2018-09-17 NOTE — Telephone Encounter (Signed)
Patient aware of lab results and recommendations. 

## 2018-09-24 ENCOUNTER — Other Ambulatory Visit: Payer: Medicare Other

## 2018-09-24 ENCOUNTER — Other Ambulatory Visit: Payer: Self-pay

## 2018-09-24 DIAGNOSIS — E871 Hypo-osmolality and hyponatremia: Secondary | ICD-10-CM | POA: Diagnosis not present

## 2018-09-25 LAB — OSMOLALITY, URINE: Osmolality, Ur: 303 mOsmol/kg

## 2018-09-25 LAB — BMP8+EGFR
BUN/Creatinine Ratio: 14 (ref 12–28)
BUN: 10 mg/dL (ref 8–27)
CO2: 21 mmol/L (ref 20–29)
Calcium: 9.4 mg/dL (ref 8.7–10.3)
Chloride: 100 mmol/L (ref 96–106)
Creatinine, Ser: 0.74 mg/dL (ref 0.57–1.00)
GFR calc Af Amer: 92 mL/min/{1.73_m2} (ref >59)
GFR calc non Af Amer: 80 mL/min/{1.73_m2} (ref >59)
Glucose: 84 mg/dL (ref 65–99)
Potassium: 4.7 mmol/L (ref 3.5–5.2)
Sodium: 135 mmol/L (ref 134–144)

## 2018-09-25 LAB — SODIUM, URINE, RANDOM: Sodium, Ur: 81 mmol/L

## 2018-09-25 LAB — OSMOLALITY: Osmolality Meas: 272 mOsmol/kg — ABNORMAL LOW (ref 280–301)

## 2018-09-28 ENCOUNTER — Telehealth: Payer: Self-pay | Admitting: Family Medicine

## 2018-09-28 NOTE — Telephone Encounter (Signed)
Patient aware of lab results.

## 2018-10-02 ENCOUNTER — Other Ambulatory Visit: Payer: Self-pay

## 2018-10-02 ENCOUNTER — Other Ambulatory Visit: Payer: Medicare Other

## 2018-10-02 DIAGNOSIS — E871 Hypo-osmolality and hyponatremia: Secondary | ICD-10-CM

## 2018-10-06 LAB — CORTISOL

## 2018-10-06 LAB — THYROID PANEL WITH TSH
Free Thyroxine Index: 1.5 (ref 1.2–4.9)
T3 Uptake Ratio: 25 % (ref 24–39)
T4, Total: 6 ug/dL (ref 4.5–12.0)
TSH: 1.25 u[IU]/mL (ref 0.450–4.500)

## 2018-10-06 LAB — CORTISOL, ACTH STIMULATION

## 2018-10-08 NOTE — Telephone Encounter (Signed)
Lab was supposed to contact her. It stated they did not have adequate blood to run the requested testing. It said the pt would be notified.

## 2018-10-08 NOTE — Telephone Encounter (Signed)
Pt aware of bad specimen from [facility] and will come back to be re-drawn

## 2018-10-09 ENCOUNTER — Other Ambulatory Visit: Payer: Self-pay

## 2018-10-09 ENCOUNTER — Other Ambulatory Visit: Payer: Medicare Other

## 2018-10-09 DIAGNOSIS — E871 Hypo-osmolality and hyponatremia: Secondary | ICD-10-CM | POA: Diagnosis not present

## 2018-10-12 ENCOUNTER — Telehealth: Payer: Self-pay | Admitting: Family Medicine

## 2018-10-20 DIAGNOSIS — Z1231 Encounter for screening mammogram for malignant neoplasm of breast: Secondary | ICD-10-CM | POA: Diagnosis not present

## 2018-10-23 ENCOUNTER — Encounter: Payer: Self-pay | Admitting: Family Medicine

## 2018-10-23 LAB — CORTISOL: Cortisol: 17.6 ug/dL

## 2018-10-23 LAB — CORTISOL, ACTH STIMULATION

## 2018-12-16 ENCOUNTER — Ambulatory Visit (INDEPENDENT_AMBULATORY_CARE_PROVIDER_SITE_OTHER): Payer: Medicare Other | Admitting: Family Medicine

## 2018-12-16 ENCOUNTER — Encounter: Payer: Self-pay | Admitting: Family Medicine

## 2018-12-16 DIAGNOSIS — R112 Nausea with vomiting, unspecified: Secondary | ICD-10-CM | POA: Diagnosis not present

## 2018-12-16 MED ORDER — ONDANSETRON HCL 4 MG PO TABS: 4.0000 mg | ORAL_TABLET | Freq: Three times a day (TID) | ORAL | 0 refills | Status: AC | PRN

## 2018-12-16 NOTE — Progress Notes (Signed)
Virtual Visit via telephone Note Due to COVID-19 pandemic this visit was conducted virtually. This visit type was conducted due to national recommendations for restrictions regarding the COVID-19 Pandemic (e.g. social distancing, sheltering in place) in an effort to limit this patient's exposure and mitigate transmission in our community. All issues noted in this document were discussed and addressed.  A physical exam was not performed with this format.   I connected with Samantha Huerta on 12/16/18 at 1150 by telephone and verified that I am speaking with the correct person using two identifiers. Samantha Huerta is currently located at home and no one is currently with them during visit. The provider, Kari Baars, FNP is located in their office at time of visit.  I discussed the limitations, risks, security and privacy concerns of performing an evaluation and management service by telephone and the availability of in person appointments. I also discussed with the patient that there may be a patient responsible charge related to this service. The patient expressed understanding and agreed to proceed.  Subjective:  Patient ID: Samantha Huerta, female    DOB: [birth year], 73 y.o.   MRN: [id number]  Chief Complaint:  Nausea and Vomiting   HPI: Samantha Huerta is a 73 y.o. female presenting on 12/16/2018 for Nausea and Vomiting   Pt reports nausea and vomiting that started last night around 2230. States she feels like it was caused by something she ate. States she has vomited several times over the last 12 hours. States she is able to hold liquids down. She has voided several times today. No abdominal pain, diarrhea, constipation, fever, chills, cough, shortness of breath, or other symptoms.   Emesis  This is a new problem. The current episode started yesterday. The problem occurs 5 to 10 times per day. The problem has been waxing and waning. The emesis has an appearance of stomach contents  and bile. There has been no fever. Pertinent negatives include no abdominal pain, arthralgias, chest pain, chills, coughing, diarrhea, dizziness, fever, headaches, myalgias, sweats, URI or weight loss. Risk factors include suspect food intake. She has tried nothing for the symptoms.     Relevant past medical, surgical, family, and social history reviewed and updated as indicated.  Allergies and medications reviewed and updated.   Past Medical History:  Diagnosis Date  . Anxiety   . Arthritis   . COPD (chronic obstructive pulmonary disease) (HCC)   . GERD (gastroesophageal reflux disease)   . Glaucoma 1998   Dr Carlynn Purl in [facility]  . Hyperlipidemia   . Hypertension   . Osteopenia     Past Surgical History:  Procedure Laterality Date  . ABDOMINAL HYSTERECTOMY    . CARPAL TUNNEL RELEASE Bilateral   . COLONOSCOPY WITH PROPOFOL N/A 10/20/2017   Procedure: COLONOSCOPY WITH PROPOFOL;  Surgeon: Corbin Ade, MD;  Location: [facility];  Service: Endoscopy;  Laterality: N/A;  1:45pm  . ELBOW SURGERY Right   . SHOULDER SURGERY Left     Social History   Socioeconomic History  . Marital status: Divorced    Spouse name: Not on file  . Number of children: 2  . Years of education: 10  . Highest education level: 10th grade  Occupational History  . Not on file  Social Needs  . Financial resource strain: Not on file  . Food insecurity    Worry: Never true    Inability: Never true  . Transportation needs    Medical: No  Non-medical: No  Tobacco Use  . Smoking status: Current Every Day Smoker    Packs/day: 0.50    Years: 53.00    Pack years: 26.50    Types: Cigarettes  . Smokeless tobacco: Never Used  Substance and Sexual Activity  . Alcohol use: Yes    Alcohol/week: 5.0 standard drinks    Types: 1 Cans of beer, 4 Shots of liquor per week  . Drug use: No  . Sexual activity: Not Currently    Birth control/protection: Surgical    Comment: hyst  Lifestyle  . Physical  activity    Days per week: 5 days    Minutes per session: 60 min  . Stress: Not at all  Relationships  . Social connections    Talks on phone: More than three times a week    Gets together: More than three times a week    Attends religious service: Never    Active member of club or organization: No    Attends meetings of clubs or organizations: Never    Relationship status: Divorced  . Intimate partner violence    Fear of current or ex partner: No    Emotionally abused: No    Physically abused: No    Forced sexual activity: Not on file  Other Topics Concern  . Not on file  Social History Narrative   Lives at home alone. Feels safe there. She has an alarm system. She has two children. Her son lives locally and her daughter lives in [place]. She has four granddaughters. She also has two nieces that live locally that she has dinner with on occasion and they will sometimes accompany her to doctor's appointments. 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 She enjoys working and gets a of physical activity there. 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     Outpatient Encounter Medications as of 12/16/2018  Medication Sig  . albuterol (PROVENTIL HFA;VENTOLIN HFA) 108 (90 Base) MCG/ACT inhaler Inhale 2 puffs into the lungs every 6 (six) hours as needed for wheezing or shortness of breath.  Marland Kitchen. amLODipine (NORVASC) 5 MG tablet Take 1 tablet (5 mg total) by mouth daily.  Marland Kitchen. aspirin EC 81 MG tablet Take 1 tablet (81 mg total) by mouth daily.  . brinzolamide (AZOPT) 1 % ophthalmic suspension Place 1 drop into both eyes 2 (two) times daily.  . Fluticasone-Salmeterol (ADVAIR) 250-50 MCG/DOSE AEPB USE 1 INHALATION TWICE DAILY  . HYDROcodone-acetaminophen (NORCO) 5-325 MG tablet Take 1 tablet by mouth 2 (two) times daily as needed for moderate pain.  Marland Kitchen. HYDROcodone-acetaminophen (NORCO) 5-325 MG tablet Take 1 tablet by mouth 2 (two) times daily as needed for  moderate pain.  Marland Kitchen. HYDROcodone-acetaminophen (NORCO/VICODIN) 5-325 MG tablet Take 1 tablet by mouth 2 (two) times daily as needed for moderate pain.  Marland Kitchen. latanoprost (XALATAN) 0.005 % ophthalmic solution Place 1 drop into both eyes at bedtime.  Marland Kitchen. lisinopril (ZESTRIL) 40 MG tablet TAKE ONE (1) TABLET EACH DAY  . Multiple Vitamins-Minerals (MULTIVITAMIN WITH MINERALS) tablet Take 1 tablet by mouth daily.  . naloxone (NARCAN) nasal spray 4 mg/0.1 mL Use as needed for respiratory depression or narcotic overdose  . omeprazole (PRILOSEC) 20 MG capsule Take 1 capsule (20 mg total) by mouth daily as needed.  . ondansetron (ZOFRAN) 4 MG tablet Take 1 tablet (4 mg total) by mouth every 8 (eight) hours as needed for nausea or vomiting.  . pravastatin (PRAVACHOL) 40  MG tablet Take 1 tablet (40 mg total) by mouth daily.   No facility-administered encounter medications on file as of 12/16/2018.     No Known Allergies  Review of Systems  Constitutional: Negative for activity change, appetite change, chills, diaphoresis, fatigue, fever, unexpected weight change and weight loss.  HENT: Negative.   Eyes: Negative.  Negative for photophobia and visual disturbance.  Respiratory: Negative for cough, chest tightness and shortness of breath.   Cardiovascular: Negative for chest pain, palpitations and leg swelling.  Gastrointestinal: Positive for nausea and vomiting. Negative for abdominal distention, abdominal pain, anal bleeding, blood in stool, constipation, diarrhea and rectal pain.  Endocrine: Negative.   Genitourinary: Negative for decreased urine volume, difficulty urinating, dysuria, frequency and urgency.  Musculoskeletal: Negative for arthralgias and myalgias.  Skin: Negative.   Allergic/Immunologic: Negative.   Neurological: Negative for dizziness, syncope, weakness, light-headedness and headaches.  Hematological: Negative.   Psychiatric/Behavioral: Negative for confusion, hallucinations, sleep  disturbance and suicidal ideas.  All other systems reviewed and are negative.        Observations/Objective: No vital signs or physical exam, this was a telephone or virtual health encounter.  Pt alert and oriented, answers all questions appropriately, and able to speak in full sentences.    Assessment and Plan: Abigael was seen today for nausea and vomiting.  Diagnoses and all orders for this visit:  Nausea and vomiting in adult BRAT diet discussed. Pedialyte or gatorade for hydration. Report any new or worsening symptoms. Advance diet as tolerated. Follow up if not improving. Medications as prescribed.  -     ondansetron (ZOFRAN) 4 MG tablet; Take 1 tablet (4 mg total) by mouth every 8 (eight) hours as needed for nausea or vomiting.     Follow Up Instructions: Return if symptoms worsen or fail to improve.    I discussed the assessment and treatment plan with the patient. The patient was provided an opportunity to ask questions and all were answered. The patient agreed with the plan and demonstrated an understanding of the instructions.   The patient was advised to call back or seek an in-person evaluation if the symptoms worsen or if the condition fails to improve as anticipated.  The above assessment and management plan was discussed with the patient. The patient verbalized understanding of and has agreed to the management plan. Patient is aware to call the clinic if they develop any new symptoms or if symptoms persist or worsen. Patient is aware when to return to the clinic for a follow-up visit. Patient educated on when it is appropriate to go to the emergency department.    I provided 15 minutes of non-face-to-face time during this encounter. The call started at 1150. The call ended at 1205. The other time was used for coordination of care.    Monia Pouch, FNP-C [facility] [address] [place], [place] [address] [phone] 12/16/18

## 2018-12-21 ENCOUNTER — Other Ambulatory Visit: Payer: Self-pay | Admitting: Family Medicine

## 2018-12-21 DIAGNOSIS — G8929 Other chronic pain: Secondary | ICD-10-CM

## 2018-12-25 ENCOUNTER — Ambulatory Visit (INDEPENDENT_AMBULATORY_CARE_PROVIDER_SITE_OTHER): Payer: Medicare Other | Admitting: Family Medicine

## 2018-12-25 ENCOUNTER — Encounter: Payer: Self-pay | Admitting: Family Medicine

## 2018-12-25 DIAGNOSIS — G8929 Other chronic pain: Secondary | ICD-10-CM

## 2018-12-25 DIAGNOSIS — M545 Low back pain: Secondary | ICD-10-CM | POA: Diagnosis not present

## 2018-12-25 MED ORDER — HYDROCODONE-ACETAMINOPHEN 5-325 MG PO TABS: 1.0000 | ORAL_TABLET | Freq: Two times a day (BID) | ORAL | 0 refills | Status: DC | PRN

## 2018-12-25 NOTE — Progress Notes (Signed)
Virtual Visit via telephone Note Due to COVID-19 pandemic this visit was conducted virtually. This visit type was conducted due to national recommendations for restrictions regarding the COVID-19 Pandemic (e.g. social distancing, sheltering in place) in an effort to limit this patient's exposure and mitigate transmission in our community. All issues noted in this document were discussed and addressed.  A physical exam was not performed with this format.   I connected with Samantha Huerta on 12/25/18 at 1030 by telephone and verified that I am speaking with the correct person using two identifiers. Samantha Huerta is currently located at home and no one is currently with them during visit. The provider, Monia Pouch, FNP is located in their office at time of visit.  I discussed the limitations, risks, security and privacy concerns of performing an evaluation and management service by telephone and the availability of in person appointments. I also discussed with the patient that there may be a patient responsible charge related to this service. The patient expressed understanding and agreed to proceed.  Subjective:  Patient ID: Samantha Huerta, female    DOB: 14-Feb-1946, 73 y.o.   MRN: [id number]  Chief Complaint:  Pain   HPI: [name] is a 73 y.o. female presenting on 12/25/2018 for Pain  Pt following up for management of chronic pain in back and wrists due to arthritis.   Pain assessment: Cause of pain- Arthritis Pain location- lower back and wrists Pain on scale of 1-10- 6/10  Frequency- daily What increases pain-working, standing for long periods What makes pain Better-rest and medication Effects on ADL - minimal, able to perform all ADL's Any change in general medical condition-no  Current opioids rx- hydrocodone 5 / 325 mg, 1 tablet 2 times daily as needed for moderate to severe pain # meds rx- #45 Effectiveness of current meds-good control of pain Adverse reactions  form pain meds-none Morphine equivalent- 10 mg MEDD  Pill count performed-No Last drug screen - 06/11/2018 - do at next in office visit.  Urine drug screen today- No Was the [place] reviewed- yes  If yes were their any concerning findings? - no  Opioid Risk  12/25/2018  Alcohol 0  Illegal Drugs 0  Rx Drugs 0  Alcohol 3  Illegal Drugs 0  Rx Drugs 0  Age between 16-45 years  0  History of Preadolescent Sexual Abuse 0  Psychological Disease 2  ADD Negative  OCD Negative  Bipolar Negative  Depression 0  Opioid Risk Tool Scoring 5  Opioid Risk Interpretation Moderate Risk     Pain contract signed on: 06/11/2018   Relevant past medical, surgical, family, and social history reviewed and updated as indicated.  Allergies and medications reviewed and updated.   Past Medical History:  Diagnosis Date  . Anxiety   . Arthritis   . COPD (chronic obstructive pulmonary disease) ([place])   . GERD (gastroesophageal reflux disease)   . Glaucoma 1998   Dr Sharen Counter in [facility]  . Hyperlipidemia   . Hypertension   . Osteopenia     Past Surgical History:  Procedure Laterality Date  . ABDOMINAL HYSTERECTOMY    . CARPAL TUNNEL RELEASE Bilateral   . COLONOSCOPY WITH PROPOFOL N/A 10/20/2017   Procedure: COLONOSCOPY WITH PROPOFOL;  Surgeon: Daneil Dolin, MD;  Location: [facility];  Service: Endoscopy;  Laterality: N/A;  1:45pm  . ELBOW SURGERY Right   . SHOULDER SURGERY Left     Social History   Socioeconomic History  .  Marital status: Divorced    Spouse name: Not on file  . Number of children: 2  . Years of education: 10  . Highest education level: 10th grade  Occupational History  . Not on file  Social Needs  . Financial resource strain: Not on file  . Food insecurity    Worry: Never true    Inability: Never true  . Transportation needs    Medical: No    Non-medical: No  Tobacco Use  . Smoking status: Current Every Day Smoker    Packs/day: 0.50    Years: 53.00     Pack years: 26.50    Types: Cigarettes  . Smokeless tobacco: Never Used  Substance and Sexual Activity  . Alcohol use: Yes    Alcohol/week: 5.0 standard drinks    Types: 1 Cans of beer, 4 Shots of liquor per week  . Drug use: No  . Sexual activity: Not Currently    Birth control/protection: Surgical    Comment: hyst  Lifestyle  . Physical activity    Days per week: 5 days    Minutes per session: 60 min  . Stress: Not at all  Relationships  . Social connections    Talks on phone: More than three times a week    Gets together: More than three times a week    Attends religious service: Never    Active member of club or organization: No    Attends meetings of clubs or organizations: Never    Relationship status: Divorced  . Intimate partner violence    Fear of current or ex partner: No    Emotionally abused: No    Physically abused: No    Forced sexual activity: Not on file  Other Topics Concern  . Not on file  Social History Narrative   Lives at home alone. Feels safe there. She has an alarm system. She has two children. Her son lives locally and her daughter lives in [place]. She has four granddaughters. She also has two nieces that live locally that she has dinner with on occasion and they will sometimes accompany her to doctor's appointments. She works at the [employer] in [place] about 30 hours a week. She enjoys working and gets a of physical activity there. 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     Outpatient Encounter Medications as of 12/25/2018  Medication Sig  . albuterol (PROVENTIL HFA;VENTOLIN HFA) 108 (90 Base) MCG/ACT inhaler Inhale 2 puffs into the lungs every 6 (six) hours as needed for wheezing or shortness of breath.  Marland Kitchen amLODipine (NORVASC) 5 MG tablet Take 1 tablet (5 mg total) by mouth daily.  Marland Kitchen aspirin EC 81 MG tablet Take 1 tablet (81 mg total) by mouth daily.  . brinzolamide (AZOPT) 1 % ophthalmic suspension Place 1 drop into both  eyes 2 (two) times daily.  . Fluticasone-Salmeterol (ADVAIR) 250-50 MCG/DOSE AEPB USE 1 INHALATION TWICE DAILY  . HYDROcodone-acetaminophen (NORCO) 5-325 MG tablet Take 1 tablet by mouth 2 (two) times daily as needed for moderate pain.  Marland Kitchen HYDROcodone-acetaminophen (NORCO) 5-325 MG tablet Take 1 tablet by mouth 2 (two) times daily as needed for moderate pain.  Marland Kitchen HYDROcodone-acetaminophen (NORCO/VICODIN) 5-325 MG tablet Take 1 tablet by mouth 2 (two) times daily as needed for moderate pain.  Marland Kitchen latanoprost (XALATAN) 0.005 % ophthalmic solution Place 1 drop into both eyes at bedtime.  Marland Kitchen lisinopril (ZESTRIL) 40 MG tablet TAKE ONE (1) TABLET EACH DAY  . Multiple  Vitamins-Minerals (MULTIVITAMIN WITH MINERALS) tablet Take 1 tablet by mouth daily.  . naloxone (NARCAN) nasal spray 4 mg/0.1 mL Use as needed for respiratory depression or narcotic overdose  . omeprazole (PRILOSEC) 20 MG capsule Take 1 capsule (20 mg total) by mouth daily as needed.  . ondansetron (ZOFRAN) 4 MG tablet Take 1 tablet (4 mg total) by mouth every 8 (eight) hours as needed for nausea or vomiting.  . pravastatin (PRAVACHOL) 40 MG tablet Take 1 tablet (40 mg total) by mouth daily.  . [DISCONTINUED] HYDROcodone-acetaminophen (NORCO) 5-325 MG tablet Take 1 tablet by mouth 2 (two) times daily as needed for moderate pain.  . [DISCONTINUED] HYDROcodone-acetaminophen (NORCO) 5-325 MG tablet Take 1 tablet by mouth 2 (two) times daily as needed for moderate pain.  . [DISCONTINUED] HYDROcodone-acetaminophen (NORCO/VICODIN) 5-325 MG tablet Take 1 tablet by mouth 2 (two) times daily as needed for moderate pain.   No facility-administered encounter medications on file as of 12/25/2018.     No Known Allergies  Review of Systems  Constitutional: Negative for activity change, appetite change, chills, diaphoresis, fatigue, fever and unexpected weight change.  HENT: Negative.   Eyes: Negative.  Negative for photophobia and visual disturbance.   Respiratory: Positive for cough (chronic, COPD) and shortness of breath (intermittent with exertion, COPD). Negative for chest tightness.   Cardiovascular: Negative for chest pain, palpitations and leg swelling.  Gastrointestinal: Negative for blood in stool, constipation, diarrhea, nausea and vomiting.  Endocrine: Negative.   Genitourinary: Negative for decreased urine volume, difficulty urinating, dysuria, frequency and urgency.  Musculoskeletal: Positive for arthralgias, back pain and myalgias. Negative for gait problem, joint swelling, neck pain and neck stiffness.  Skin: Negative.  Negative for color change and pallor.  Allergic/Immunologic: Negative.   Neurological: Negative for dizziness, tremors, seizures, syncope, facial asymmetry, speech difficulty, weakness, light-headedness, numbness and headaches.  Hematological: Negative.  Does not bruise/bleed easily.  Psychiatric/Behavioral: Negative for agitation, behavioral problems, confusion, decreased concentration, dysphoric mood, hallucinations, self-injury, sleep disturbance and suicidal ideas. The patient is not nervous/anxious and is not hyperactive.   All other systems reviewed and are negative.        Observations/Objective: No vital signs or physical exam, this was a telephone or virtual health encounter.  Pt alert and oriented, answers all questions appropriately, and able to speak in full sentences.    Assessment and Plan: [place] was seen today for pain.  Diagnoses and all orders for this visit:  Encounter for chronic pain management Chronic midline low back pain without sciatica Pain well controlled with current regimen. Pt aware to only take medications as needed for moderate to severe pain. Pt aware to cut back as she needs to taper down on the amount of opioids she is taking. We will discuss this in more detail at next in office visit. Safe medication use discussed in detail. Pt has RX for Nasal Narcan.  -      HYDROcodone-acetaminophen (NORCO) 5-325 MG tablet; Take 1 tablet by mouth 2 (two) times daily as needed for moderate pain. -     HYDROcodone-acetaminophen (NORCO) 5-325 MG tablet; Take 1 tablet by mouth 2 (two) times daily as needed for moderate pain. -     HYDROcodone-acetaminophen (NORCO/VICODIN) 5-325 MG tablet; Take 1 tablet by mouth 2 (two) times daily as needed for moderate pain.     Follow Up Instructions: Return in about 3 months (around 03/27/2019), or if symptoms worsen or fail to improve, for pain.    I discussed the assessment  and treatment plan with the patient. The patient was provided an opportunity to ask questions and all were answered. The patient agreed with the plan and demonstrated an understanding of the instructions.   The patient was advised to call back or seek an in-person evaluation if the symptoms worsen or if the condition fails to improve as anticipated.  The above assessment and management plan was discussed with the patient. The patient verbalized understanding of and has agreed to the management plan. Patient is aware to call the clinic if they develop any new symptoms or if symptoms persist or worsen. Patient is aware when to return to the clinic for a follow-up visit. Patient educated on when it is appropriate to go to the emergency department.    I provided 15 minutes of non-face-to-face time during this encounter. The call started at 1030. The call ended at 1045. The other time was used for coordination of care.    Monia Pouch, FNP-C [facility] [address] [place], [place] [address] [phone] 12/25/18    Due to the nation being under [facility] of Emergency, medication is being electronically prescribed without the patient being seen in the office. Telephone contact has been made with the patient and a follow up appointment will be made. The DEA COVID 19 Policy is attached below. Pt has been seen previously  in person by this provider for management of condition.   Electronic Prescribing of Controlled Substance (EPCS) DEA Policy: Questions and Answers for Prescribing Practitioners (EPCS) DEA Guidance: Use of Mobile Devices in the [place] of EPCS Telemedicine On April 10, 2018, the Network engineer of the Department of Health and Financial controller issues a public health emergency ([place] Emergency Declaration). Question: Can telemedicine now be used under the conditions outlined in Title 21, [place] States Code (U.S.C.), Section 802(54)(D)? Answer: Yes While a prescription for a controlled substance issued by means of the Internet (including telemedicine) must generally be predicated on an in-person medical evaluation (21 U.S.C. 829(e)), the Controlled Substances Act contains certain exceptions to this requirement. One such exception occurs when the Secretary of [place] has declared a public health emergency under 42 U.S.C. [phone] (section 160 of the [place]), as set forth in 21 U.S.C. 802(54)(D). Secretary Andee Poles declared such a public health emergency with regard to COVID-19 on April 10, 2018. ([email or web address]://[email or web address].[email or web address]). For as long as the [employer] of a public health emergency remains in effect, DEA-registered practitioners may issue prescriptions for controlled substances to patients for whom they have not conducted an in-person medical evaluation, provided all of the following conditions are met: Marland Kitchen The prescription is issued for a legitimate medical purpose by a practitioner acting in the usual course of his/her professional practice  . The telemedicine communication is conducted using an audio-visual, real-time, two-way interactive communication system.  . The practitioner is acting in accordance with applicable Federal and State law. Provided the  practitioner satisfies the above requirements, the practitioner may issue the prescription using any of the methods of prescribing currently available and in the manner set forth in the [facility] regulations. Thus, the practitioner may issue a prescription either electronically (for schedules II-V) or by calling in an emergency schedule II prescription to the pharmacy, or by calling in a schedule III-V prescription to the pharmacy. Important note: If the prescribing practitioner has previously conducted an in-person medical evaluation of the patient, the practitioner may issue a prescription for a controlled substance after  having communicated with the patient via telemedicine, or any other means, regardless of whether a public health emergency has been declared by the [place], so long as the prescription is issued for a legitimate medical purpose and the practitioner is acting in the usual course of his/her professional practice. In addition, for the prescription to be valid, the practitioner must comply with any applicable State laws.

## 2019-01-12 ENCOUNTER — Ambulatory Visit: Payer: Medicare Other

## 2019-01-14 ENCOUNTER — Ambulatory Visit (INDEPENDENT_AMBULATORY_CARE_PROVIDER_SITE_OTHER): Payer: Medicare Other

## 2019-01-14 DIAGNOSIS — Z23 Encounter for immunization: Secondary | ICD-10-CM

## 2019-02-18 ENCOUNTER — Other Ambulatory Visit: Payer: Self-pay | Admitting: Family Medicine

## 2019-02-18 DIAGNOSIS — J449 Chronic obstructive pulmonary disease, unspecified: Secondary | ICD-10-CM

## 2019-03-08 ENCOUNTER — Other Ambulatory Visit: Payer: Self-pay | Admitting: Family Medicine

## 2019-03-08 DIAGNOSIS — J449 Chronic obstructive pulmonary disease, unspecified: Secondary | ICD-10-CM

## 2019-03-09 ENCOUNTER — Other Ambulatory Visit: Payer: Self-pay | Admitting: Family Medicine

## 2019-03-09 DIAGNOSIS — I10 Essential (primary) hypertension: Secondary | ICD-10-CM

## 2019-03-24 ENCOUNTER — Other Ambulatory Visit: Payer: Self-pay

## 2019-03-24 ENCOUNTER — Telehealth: Payer: Self-pay | Admitting: Family Medicine

## 2019-03-25 ENCOUNTER — Encounter: Payer: Self-pay | Admitting: Family Medicine

## 2019-03-25 ENCOUNTER — Ambulatory Visit (INDEPENDENT_AMBULATORY_CARE_PROVIDER_SITE_OTHER): Payer: Medicare Other | Admitting: Family Medicine

## 2019-03-25 VITALS — BP 138/74 | HR 114 | Temp 98.6°F | Resp 20 | Ht 65.0 in | Wt 120.0 lb

## 2019-03-25 DIAGNOSIS — E782 Mixed hyperlipidemia: Secondary | ICD-10-CM | POA: Diagnosis not present

## 2019-03-25 DIAGNOSIS — I10 Essential (primary) hypertension: Secondary | ICD-10-CM

## 2019-03-25 DIAGNOSIS — J449 Chronic obstructive pulmonary disease, unspecified: Secondary | ICD-10-CM | POA: Diagnosis not present

## 2019-03-25 DIAGNOSIS — E559 Vitamin D deficiency, unspecified: Secondary | ICD-10-CM | POA: Diagnosis not present

## 2019-03-25 DIAGNOSIS — M545 Low back pain: Secondary | ICD-10-CM | POA: Diagnosis not present

## 2019-03-25 DIAGNOSIS — J418 Mixed simple and mucopurulent chronic bronchitis: Secondary | ICD-10-CM | POA: Insufficient documentation

## 2019-03-25 DIAGNOSIS — G8929 Other chronic pain: Secondary | ICD-10-CM

## 2019-03-25 MED ORDER — HYDROCODONE-ACETAMINOPHEN 5-325 MG PO TABS: 1.0000 | ORAL_TABLET | Freq: Two times a day (BID) | ORAL | 0 refills | Status: DC | PRN

## 2019-03-25 MED ORDER — ALBUTEROL SULFATE HFA 108 (90 BASE) MCG/ACT IN AERS: 2.0000 | INHALATION_SPRAY | RESPIRATORY_TRACT | 11 refills | Status: AC | PRN

## 2019-03-25 MED ORDER — FLUTICASONE-SALMETEROL 250-50 MCG/DOSE IN AEPB: INHALATION_SPRAY | RESPIRATORY_TRACT | 2 refills | Status: DC

## 2019-03-25 NOTE — Progress Notes (Signed)
Subjective:  Patient ID: Samantha Huerta, female    DOB: 18-Aug-1945, 74 y.o.   MRN: [id number]  Patient Care Team: Baruch Gouty, FNP as PCP - General (Family Medicine) Gala Romney Cristopher Estimable, MD as Consulting Physician (Gastroenterology) Amedeo Kinsman, OD (Optometry)   Chief Complaint:  Medical Management of Chronic Issues   HPI: Samantha Huerta is a 74 y.o. female presenting on 03/25/2019 for Medical Management of Chronic Issues   1. COPD (chronic obstructive pulmonary disease) with chronic bronchitis ([place]) Pt reports she has been doing great on Advair daily and albuterol as needed. She still smokes and does not wish to quit at this time. States she has cut back some. No increased cough, sputum production, or shortness of breath. States her symptoms are well controlled and she only has to use her SABA a few times per week.   2. Chronic midline low back pain without sciatica Pain assessment: Cause of pain- arthritis  Pain location- lower back and right wrist Pain on scale of 1-10- 4/10 today, 7-8 / 10 at worst Frequency- daily What increases pain-activity and working What makes pain Better-rest and medication  Effects on ADL - able to perform ADL's, continues to work Any change in general medical condition-none  Current opioids rx- Hydrocodone 5/325 mg  1-2 times per day prn # meds rx- #45 Effectiveness of current meds-good control unless pain gets really bad Adverse reactions form pain meds-none Morphine equivalent- 10 MEDD  Pill count performed-No Last drug screen - 06/11/2018 ( high risk q74m moderate risk q649mlow risk yearly ) Urine drug screen today- No [place]  If yes were their any concerning findings? - no  Opioid Risk  12/25/2018  Alcohol 0  Illegal Drugs 0  Rx Drugs 0  Alcohol 3  Illegal Drugs 0  Rx Drugs 0  Age between 16-45 years  0  History of Preadolescent Sexual Abuse 0  Psychological Disease 2  ADD Negative  OCD Negative    Bipolar Negative  Depression 0  Opioid Risk Tool Scoring 5  Opioid Risk Interpretation Moderate Risk   Pain contract signed on: 06/11/2018  3. Essential hypertension Complaint with meds - Yes Current Medications - norvasc, zestril Checking BP at home - no Exercising Regularly - No Watching Salt intake - Yes Pertinent ROS:  Headache - No Fatigue - No Visual Disturbances - No Chest pain - No Dyspnea - No Palpitations - No LE edema - No They report good compliance with medications and can restate their regimen by memory. No medication side effects.  Family, social, and smoking history reviewed.   BP Readings from Last 3 Encounters:  03/25/19 138/74  09/15/18 132/80  07/09/18 128/70   CMP Latest Ref Rng & Units 09/24/2018 09/15/2018 06/11/2018  Glucose 65 - 99 mg/dL 84 84 95  BUN 8 - 27 mg/dL '10 11 10  '$ Creatinine 0.57 - 1.00 mg/dL 0.74 0.71 0.66  Sodium 134 - 144 mmol/L 135 129(L) 133(L)  Potassium 3.5 - 5.2 mmol/L 4.7 4.2 5.0  Chloride 96 - 106 mmol/L 100 94(L) 95(L)  CO2 20 - 29 mmol/L '21 20 23  '$ Calcium 8.7 - 10.3 mg/dL 9.4 9.6 10.1  Total Protein 6.0 - 8.5 g/dL - 6.5 6.6  Total Bilirubin 0.0 - 1.2 mg/dL - 0.5 0.5  Alkaline Phos 39 - 117 IU/L - 74 68  AST 0 - 40 IU/L - 21 20  ALT 0 - 32 IU/L - 17 11  4. Mixed hyperlipidemia Compliant with medications - Yes Current medications - pravastatin Side effects from medications - No Diet - generally healthy Exercise - none  Lab Results  Component Value Date   CHOL 202 (H) 06/11/2018   HDL 121 06/11/2018   LDLCALC 72 06/11/2018   TRIG 46 06/11/2018   CHOLHDL 1.7 06/11/2018     Family and personal medical history reviewed. Smoking and ETOH history reviewed.    5. Vitamin D deficiency Pt is taking oral repletion therapy. Denies bone pain and tenderness, muscle weakness, fracture, and difficulty walking. No results found for: VD25OH Lab Results  Component Value Date   CALCIUM 9.4 09/24/2018          Relevant past medical, surgical, family, and social history reviewed and updated as indicated.  Allergies and medications reviewed and updated. Date reviewed: Chart in Epic.   Past Medical History:  Diagnosis Date  . Anxiety   . Arthritis   . COPD (chronic obstructive pulmonary disease) ([place])   . GERD (gastroesophageal reflux disease)   . Glaucoma 1998   Dr Sharen Counter in [facility]  . Hyperlipidemia   . Hypertension   . Osteopenia     Past Surgical History:  Procedure Laterality Date  . ABDOMINAL HYSTERECTOMY    . CARPAL TUNNEL RELEASE Bilateral   . COLONOSCOPY WITH PROPOFOL N/A 10/20/2017   Procedure: COLONOSCOPY WITH PROPOFOL;  Surgeon: Daneil Dolin, MD;  Location: [facility];  Service: Endoscopy;  Laterality: N/A;  1:45pm  . ELBOW SURGERY Right   . SHOULDER SURGERY Left     Social History   Socioeconomic History  . Marital status: Divorced    Spouse name: Not on file  . Number of children: 2  . Years of education: 10  . Highest education level: 10th grade  Occupational History  . Not on file  Tobacco Use  . Smoking status: Current Every Day Smoker    Packs/day: 0.50    Years: 53.00    Pack years: 26.50    Types: Cigarettes  . Smokeless tobacco: Never Used  Substance and Sexual Activity  . Alcohol use: Yes    Alcohol/week: 5.0 standard drinks    Types: 1 Cans of beer, 4 Shots of liquor per week  . Drug use: No  . Sexual activity: Not Currently    Birth control/protection: Surgical    Comment: hyst  Other Topics Concern  . Not on file  Social History Narrative   Lives at home alone. Feels safe there. She has an alarm system. She has two children. Her son lives locally and her daughter lives in [place]. She has four granddaughters. She also has two nieces that live locally that she has dinner with on occasion and they will sometimes accompany her to doctor's appointments. She works at the [employer] in [place] about 30 hours a week. She  enjoys working and gets a of physical activity there. 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    Social Determinants of Health   Financial Resource Strain:   . Difficulty of Paying Living Expenses: Not on file  Food Insecurity:   . Worried About Charity fundraiser in the Last Year: Not on file  . Ran Out of Food in the Last Year: Not on file  Transportation Needs:   . Lack of Transportation (Medical): Not on file  . Lack of Transportation (Non-Medical): Not on file  Physical Activity:   . Days of Exercise per  Week: Not on file  . Minutes of Exercise per Session: Not on file  Stress:   . Feeling of Stress : Not on file  Social Connections:   . Frequency of Communication with Friends and Family: Not on file  . Frequency of Social Gatherings with Friends and Family: Not on file  . Attends Religious Services: Not on file  . Active Member of Clubs or Organizations: Not on file  . Attends Archivist Meetings: Not on file  . Marital Status: Not on file  Intimate Partner Violence:   . Fear of Current or Ex-Partner: Not on file  . Emotionally Abused: Not on file  . Physically Abused: Not on file  . Sexually Abused: Not on file    Outpatient Encounter Medications as of 03/25/2019  Medication Sig  . albuterol (VENTOLIN HFA) 108 (90 Base) MCG/ACT inhaler Inhale 2 puffs into the lungs every 4 (four) hours as needed for wheezing or shortness of breath.  Marland Kitchen amLODipine (NORVASC) 5 MG tablet TAKE ONE (1) TABLET EACH DAY  . aspirin EC 81 MG tablet Take 1 tablet (81 mg total) by mouth daily.  . brinzolamide (AZOPT) 1 % ophthalmic suspension Place 1 drop into both eyes 2 (two) times daily.  . Fluticasone-Salmeterol (ADVAIR) 250-50 MCG/DOSE AEPB USE 1 INHALATION TWICE DAILY  . [START ON 05/21/2019] HYDROcodone-acetaminophen (NORCO) 5-325 MG tablet Take 1 tablet by mouth 2 (two) times daily as needed for moderate pain.  Derrill Memo ON 04/24/2019] HYDROcodone-acetaminophen (NORCO) 5-325 MG  tablet Take 1 tablet by mouth 2 (two) times daily as needed for moderate pain.  Marland Kitchen HYDROcodone-acetaminophen (NORCO/VICODIN) 5-325 MG tablet Take 1 tablet by mouth 2 (two) times daily as needed for moderate pain.  Marland Kitchen latanoprost (XALATAN) 0.005 % ophthalmic solution Place 1 drop into both eyes at bedtime.  Marland Kitchen lisinopril (ZESTRIL) 40 MG tablet TAKE ONE (1) TABLET EACH DAY  . Multiple Vitamins-Minerals (MULTIVITAMIN WITH MINERALS) tablet Take 1 tablet by mouth daily.  . naloxone (NARCAN) nasal spray 4 mg/0.1 mL Use as needed for respiratory depression or narcotic overdose  . omeprazole (PRILOSEC) 20 MG capsule Take 1 capsule (20 mg total) by mouth daily as needed.  . ondansetron (ZOFRAN) 4 MG tablet Take 1 tablet (4 mg total) by mouth every 8 (eight) hours as needed for nausea or vomiting.  . pravastatin (PRAVACHOL) 40 MG tablet Take 1 tablet (40 mg total) by mouth daily.  . [DISCONTINUED] albuterol (VENTOLIN HFA) 108 (90 Base) MCG/ACT inhaler USE 2 PUFFS EVERY 6 HOURS AS NEEDED  . [DISCONTINUED] Fluticasone-Salmeterol (ADVAIR) 250-50 MCG/DOSE AEPB USE 1 INHALATION TWICE DAILY  . [DISCONTINUED] HYDROcodone-acetaminophen (NORCO) 5-325 MG tablet Take 1 tablet by mouth 2 (two) times daily as needed for moderate pain.  . [DISCONTINUED] HYDROcodone-acetaminophen (NORCO) 5-325 MG tablet Take 1 tablet by mouth 2 (two) times daily as needed for moderate pain.  . [DISCONTINUED] HYDROcodone-acetaminophen (NORCO/VICODIN) 5-325 MG tablet Take 1 tablet by mouth 2 (two) times daily as needed for moderate pain.  . Cholecalciferol (VITAMIN D3) 100000 UNIT/GM POWD Take by mouth.   No facility-administered encounter medications on file as of 03/25/2019.    No Known Allergies  Review of Systems  Constitutional: Negative for activity change, appetite change, chills, diaphoresis, fatigue, fever and unexpected weight change.  HENT: Negative.   Eyes: Negative.  Negative for photophobia and visual disturbance.   Respiratory: Positive for cough and wheezing. Negative for chest tightness and shortness of breath.   Cardiovascular: Negative for chest pain, palpitations  and leg swelling.  Gastrointestinal: Negative for abdominal pain, blood in stool, constipation, diarrhea, nausea and vomiting.  Endocrine: Negative.   Genitourinary: Negative for decreased urine volume, difficulty urinating, dysuria, frequency and urgency.  Musculoskeletal: Positive for arthralgias and back pain. Negative for gait problem and myalgias.  Skin: Negative.   Allergic/Immunologic: Negative.   Neurological: Negative for dizziness, tremors, seizures, syncope, facial asymmetry, speech difficulty, weakness, light-headedness, numbness and headaches.  Hematological: Negative.   Psychiatric/Behavioral: Negative for confusion, hallucinations, sleep disturbance and suicidal ideas.  All other systems reviewed and are negative.       Objective:  BP 138/74   Pulse (!) 114   Temp 98.6 F (37 C) (Temporal)   Resp 20   Ht '5\' 5"'$  (1.651 m)   Wt 120 lb (54.4 kg)   SpO2 99%   BMI 19.97 kg/m    Wt Readings from Last 3 Encounters:  03/25/19 120 lb (54.4 kg)  09/15/18 126 lb 6.4 oz (57.3 kg)  07/09/18 124 lb (56.2 kg)    Physical Exam Vitals and nursing note reviewed.  Constitutional:      General: She is not in acute distress.    Appearance: Normal appearance. She is well-developed and well-groomed. She is not ill-appearing, toxic-appearing or diaphoretic.  HENT:     Head: Normocephalic and atraumatic.     Jaw: There is normal jaw occlusion.     Right Ear: Hearing normal.     Left Ear: Hearing normal.     Nose: Nose normal.     Mouth/Throat:     Lips: Pink.     Mouth: Mucous membranes are moist.     Pharynx: Oropharynx is clear. Uvula midline.  Eyes:     General: Lids are normal.     Extraocular Movements: Extraocular movements intact.     Conjunctiva/sclera: Conjunctivae normal.     Pupils: Pupils are equal, round,  and reactive to light.  Neck:     Thyroid: No thyroid mass, thyromegaly or thyroid tenderness.     Vascular: No carotid bruit or JVD.     Trachea: Trachea and phonation normal.  Cardiovascular:     Rate and Rhythm: Normal rate and regular rhythm.     Chest Wall: PMI is not displaced.     Pulses: Normal pulses.     Heart sounds: Normal heart sounds. No murmur. No friction rub. No gallop.   Pulmonary:     Effort: Pulmonary effort is normal. No respiratory distress.     Breath sounds: Wheezing (minimal) present.  Abdominal:     General: Bowel sounds are normal. There is no distension or abdominal bruit.     Palpations: Abdomen is soft. There is no hepatomegaly or splenomegaly.     Tenderness: There is no abdominal tenderness. There is no right CVA tenderness or left CVA tenderness.     Hernia: No hernia is present.  Musculoskeletal:     Cervical back: Normal range of motion and neck supple.     Thoracic back: Normal.     Lumbar back: Tenderness present. No swelling, edema, deformity, signs of trauma, lacerations, spasms or bony tenderness. Decreased range of motion. Negative right straight leg raise test and negative left straight leg raise test. No scoliosis.     Right hip: Normal.     Left hip: Normal.     Right lower leg: No edema.     Left lower leg: No edema.  Lymphadenopathy:     Cervical: No cervical adenopathy.  Skin:  General: Skin is warm and dry.     Capillary Refill: Capillary refill takes less than 2 seconds.     Coloration: Skin is not cyanotic, jaundiced or pale.     Findings: No rash.  Neurological:     General: No focal deficit present.     Mental Status: She is alert and oriented to person, place, and time.     Cranial Nerves: Cranial nerves are intact. No cranial nerve deficit.     Sensory: Sensation is intact. No sensory deficit.     Motor: Motor function is intact. No weakness.     Coordination: Coordination is intact. Coordination normal.     Gait: Gait is  intact. Gait normal.     Deep Tendon Reflexes: Reflexes are normal and symmetric. Reflexes normal.  Psychiatric:        Attention and Perception: Attention and perception normal.        Mood and Affect: Mood and affect normal.        Speech: Speech normal.        Behavior: Behavior normal. Behavior is cooperative.        Thought Content: Thought content normal.        Cognition and Memory: Cognition and memory normal.        Judgment: Judgment normal.     Results for orders placed or performed in visit on 10/09/18  Cortisol  Result Value Ref Range   Cortisol 17.6 ug/dL  Cortisol, ACTH Stimulation  Result Value Ref Range   Cortisol Baseline CANCELED ug/dL   Cortisol Stimulated CANCELED        Pertinent labs & imaging results that were available during my care of the patient were reviewed by me and considered in my medical decision making.  Assessment & Plan:  Samantha Huerta was seen today for medical management of chronic issues.  Diagnoses and all orders for this visit:  COPD (chronic obstructive pulmonary disease) with chronic bronchitis ([place]) Doing well on below, no worsening or new symptoms. Will continue.  -     Fluticasone-Salmeterol (ADVAIR) 250-50 MCG/DOSE AEPB; USE 1 INHALATION TWICE DAILY -     albuterol (VENTOLIN HFA) 108 (90 Base) MCG/ACT inhaler; Inhale 2 puffs into the lungs every 4 (four) hours as needed for wheezing or shortness of breath. -     CBC with Differential/Platelet  Chronic midline low back pain without sciatica Doing well on current medications without adverse side effects. Will continue below. Pt aware to only take as needed for severe pain.  -     HYDROcodone-acetaminophen (NORCO) 5-325 MG tablet; Take 1 tablet by mouth 2 (two) times daily as needed for moderate pain. -     HYDROcodone-acetaminophen (NORCO) 5-325 MG tablet; Take 1 tablet by mouth 2 (two) times daily as needed for moderate pain. -     HYDROcodone-acetaminophen (NORCO/VICODIN) 5-325 MG  tablet; Take 1 tablet by mouth 2 (two) times daily as needed for moderate pain.  Essential hypertension BP well controlled. Changes were not made in regimen today. Goal BP is 130/80. Pt aware to report any persistent high or low readings. DASH diet and exercise encouraged. Exercise at least 150 minutes per week and increase as tolerated. Goal BMI > 25. Stress management encouraged. Avoid nicotine and tobacco product use. Avoid excessive alcohol and NSAID's. Avoid more than 2000 mg of sodium daily. Medications as prescribed. Follow up as scheduled.  -     CBC with Differential/Platelet -     CMP14+EGFR -  Lipid panel -     Thyroid Panel With TSH  Mixed hyperlipidemia Diet encouraged - increase intake of fresh fruits and vegetables, increase intake of lean proteins. Bake, broil, or grill foods. Avoid fried, greasy, and fatty foods. Avoid fast foods. Increase intake of fiber-rich whole grains. Exercise encouraged - at least 150 minutes per week and advance as tolerated.  Goal BMI < 25. Continue medications as prescribed. Follow up in 3-6 months as discussed.  -     CMP14+EGFR -     Lipid panel  Vitamin D deficiency Labs pending. Continue repletion therapy. If indicated, will change repletion dosage. Eat foods rich in Vit D including milk, orange juice, yogurt with vitamin D added, salmon or mackerel, canned tuna fish, cereals with vitamin D added, and cod liver oil. Get out in the sun but make sure to wear at least SPF 30 sunscreen.  -     Vitamin D 25 hydroxy     Continue all other maintenance medications.  Follow up plan: Return in about 3 months (around 06/23/2019), or if symptoms worsen or fail to improve.  Continue healthy lifestyle choices, including diet (rich in fruits, vegetables, and lean proteins, and low in salt and simple carbohydrates) and exercise (at least 30 minutes of moderate physical activity daily).  Educational handout given for DASH diet  The above assessment and  management plan was discussed with the patient. The patient verbalized understanding of and has agreed to the management plan. Patient is aware to call the clinic if they develop any new symptoms or if symptoms persist or worsen. Patient is aware when to return to the clinic for a follow-up visit. Patient educated on when it is appropriate to go to the emergency department.   Monia Pouch, FNP-C [facility] [phone]

## 2019-03-25 NOTE — Patient Instructions (Signed)
DASH Eating Plan DASH stands for "Dietary Approaches to Stop Hypertension." The DASH eating plan is a healthy eating plan that has been shown to reduce high blood pressure (hypertension). Additional health benefits may include reducing the risk of type 2 diabetes mellitus, heart disease, and stroke. The DASH eating plan may also help with weight loss.  WHAT DO I NEED TO KNOW ABOUT THE DASH EATING PLAN? For the DASH eating plan, you will follow these general guidelines:  Choose foods with a percent daily value for sodium of less than 5% (as listed on the food label).  Use salt-free seasonings or herbs instead of table salt or sea salt.  Check with your health care provider or pharmacist before using salt substitutes.  Eat lower-sodium products, often labeled as "lower sodium" or "no salt added."  Eat fresh foods.  Eat more vegetables, fruits, and low-fat dairy products.  Choose whole grains. Look for the word "whole" as the first word in the ingredient list.  Choose fish and skinless chicken or turkey more often than red meat. Limit fish, poultry, and meat to 6 oz (170 g) each day.  Limit sweets, desserts, sugars, and sugary drinks.  Choose heart-healthy fats.  Limit cheese to 1 oz (28 g) per day.  Eat more home-cooked food and less restaurant, buffet, and fast food.  Limit fried foods.  Cook foods using methods other than frying.  Limit canned vegetables. If you do use them, rinse them well to decrease the sodium.  When eating at a restaurant, ask that your food be prepared with less salt, or no salt if possible.  WHAT FOODS CAN I EAT? Seek help from a dietitian for individual calorie needs.  Grains Whole grain or whole wheat bread. Brown rice. Whole grain or whole wheat pasta. Quinoa, bulgur, and whole grain cereals. Low-sodium cereals. Corn or whole wheat flour tortillas. Whole grain cornbread. Whole grain crackers. Low-sodium crackers.  Vegetables Fresh or frozen  vegetables (raw, steamed, roasted, or grilled). Low-sodium or reduced-sodium tomato and vegetable juices. Low-sodium or reduced-sodium tomato sauce and paste. Low-sodium or reduced-sodium canned vegetables.   Fruits All fresh, canned (in natural juice), or frozen fruits.  Meat and Other Protein Products Ground beef (85% or leaner), grass-fed beef, or beef trimmed of fat. Skinless chicken or turkey. Ground chicken or turkey. Pork trimmed of fat. All fish and seafood. Eggs. Dried beans, peas, or lentils. Unsalted nuts and seeds. Unsalted canned beans.  Dairy Low-fat dairy products, such as skim or 1% milk, 2% or reduced-fat cheeses, low-fat ricotta or cottage cheese, or plain low-fat yogurt. Low-sodium or reduced-sodium cheeses.  Fats and Oils Tub margarines without trans fats. Light or reduced-fat mayonnaise and salad dressings (reduced sodium). Avocado. Safflower, olive, or canola oils. Natural peanut or almond butter.  Other Unsalted popcorn and pretzels. The items listed above may not be a complete list of recommended foods or beverages. Contact your dietitian for more options.  WHAT FOODS ARE NOT RECOMMENDED?  Grains White bread. White pasta. White rice. Refined cornbread. Bagels and croissants. Crackers that contain trans fat.  Vegetables Creamed or fried vegetables. Vegetables in a cheese sauce. Regular canned vegetables. Regular canned tomato sauce and paste. Regular tomato and vegetable juices.  Fruits Dried fruits. Canned fruit in light or heavy syrup. Fruit juice.  Meat and Other Protein Products Fatty cuts of meat. Ribs, chicken wings, bacon, sausage, bologna, salami, chitterlings, fatback, hot dogs, bratwurst, and packaged luncheon meats. Salted nuts and seeds. Canned beans with salt.    Dairy Whole or 2% milk, cream, half-and-half, and cream cheese. Whole-fat or sweetened yogurt. Full-fat cheeses or blue cheese. Nondairy creamers and whipped toppings. Processed cheese,  cheese spreads, or cheese curds.  Condiments Onion and garlic salt, seasoned salt, table salt, and sea salt. Canned and packaged gravies. Worcestershire sauce. Tartar sauce. Barbecue sauce. Teriyaki sauce. Soy sauce, including reduced sodium. Steak sauce. Fish sauce. Oyster sauce. Cocktail sauce. Horseradish. Ketchup and mustard. Meat flavorings and tenderizers. Bouillon cubes. Hot sauce. Tabasco sauce. Marinades. Taco seasonings. Relishes.  Fats and Oils Butter, stick margarine, lard, shortening, ghee, and bacon fat. Coconut, palm kernel, or palm oils. Regular salad dressings.  Other Pickles and olives. Salted popcorn and pretzels.  The items listed above may not be a complete list of foods and beverages to avoid. Contact your dietitian for more information.  WHERE CAN I FIND MORE INFORMATION? National Heart, Lung, and Blood Institute: www.nhlbi.nih.gov/health/health-topics/topics/dash/ Document Released: 02/14/2011 Document Revised: 07/12/2013 Document Reviewed: 12/30/2012 ExitCare Patient Information 2015 [employer]. This information is not intended to replace advice given to you by your health care provider. Make sure you discuss any questions you have with your health care provider.   I think that you would greatly benefit from seeing a nutritionist.  If you are interested, please call Dr Sykes at [phone] to schedule an appointment.   

## 2019-03-26 ENCOUNTER — Ambulatory Visit: Payer: Medicare Other | Admitting: Family Medicine

## 2019-03-26 LAB — LIPID PANEL
Chol/HDL Ratio: 1.8 ratio (ref 0.0–4.4)
Cholesterol, Total: 212 mg/dL — ABNORMAL HIGH (ref 100–199)
HDL: 119 mg/dL (ref >39)
LDL Chol Calc (NIH): 82 mg/dL (ref 0–99)
Triglycerides: 61 mg/dL (ref 0–149)
VLDL Cholesterol Cal: 11 mg/dL (ref 5–40)

## 2019-03-26 LAB — CBC WITH DIFFERENTIAL/PLATELET
Basophils Absolute: 0.1 10*3/uL (ref 0.0–0.2)
Basos: 1 %
EOS (ABSOLUTE): 0.1 10*3/uL (ref 0.0–0.4)
Eos: 1 %
Hematocrit: 39.3 % (ref 34.0–46.6)
Hemoglobin: 12.9 g/dL (ref 11.1–15.9)
Immature Grans (Abs): 0 10*3/uL (ref 0.0–0.1)
Immature Granulocytes: 0 %
Lymphocytes Absolute: 1 10*3/uL (ref 0.7–3.1)
Lymphs: 18 %
MCH: 31.5 pg (ref 26.6–33.0)
MCHC: 32.8 g/dL (ref 31.5–35.7)
MCV: 96 fL (ref 79–97)
Monocytes Absolute: 0.8 10*3/uL (ref 0.1–0.9)
Monocytes: 13 %
Neutrophils Absolute: 4 10*3/uL (ref 1.4–7.0)
Neutrophils: 67 %
Platelets: 536 10*3/uL — ABNORMAL HIGH (ref 150–450)
RBC: 4.1 x10E6/uL (ref 3.77–5.28)
RDW: 11.6 % — ABNORMAL LOW (ref 11.7–15.4)
WBC: 6 10*3/uL (ref 3.4–10.8)

## 2019-03-26 LAB — CMP14+EGFR
ALT: 18 IU/L (ref 0–32)
AST: 27 IU/L (ref 0–40)
Albumin/Globulin Ratio: 1.8 (ref 1.2–2.2)
Albumin: 4.4 g/dL (ref 3.7–4.7)
Alkaline Phosphatase: 75 IU/L (ref 39–117)
BUN/Creatinine Ratio: 11 — ABNORMAL LOW (ref 12–28)
BUN: 8 mg/dL (ref 8–27)
Bilirubin Total: 0.3 mg/dL (ref 0.0–1.2)
CO2: 22 mmol/L (ref 20–29)
Calcium: 10.1 mg/dL (ref 8.7–10.3)
Chloride: 98 mmol/L (ref 96–106)
Creatinine, Ser: 0.74 mg/dL (ref 0.57–1.00)
GFR calc Af Amer: 92 mL/min/{1.73_m2} (ref >59)
GFR calc non Af Amer: 80 mL/min/{1.73_m2} (ref >59)
Globulin, Total: 2.4 g/dL (ref 1.5–4.5)
Glucose: 136 mg/dL — ABNORMAL HIGH (ref 65–99)
Potassium: 4.7 mmol/L (ref 3.5–5.2)
Sodium: 135 mmol/L (ref 134–144)
Total Protein: 6.8 g/dL (ref 6.0–8.5)

## 2019-03-26 LAB — THYROID PANEL WITH TSH
Free Thyroxine Index: 1.5 (ref 1.2–4.9)
T3 Uptake Ratio: 28 % (ref 24–39)
T4, Total: 5.3 ug/dL (ref 4.5–12.0)
TSH: 1.04 u[IU]/mL (ref 0.450–4.500)

## 2019-03-26 LAB — VITAMIN D 25 HYDROXY (VIT D DEFICIENCY, FRACTURES): Vit D, 25-Hydroxy: 31.1 ng/mL (ref 30.0–100.0)

## 2019-05-20 ENCOUNTER — Encounter: Payer: Self-pay | Admitting: *Deleted

## 2019-06-02 ENCOUNTER — Telehealth: Payer: Self-pay | Admitting: Family Medicine

## 2019-06-02 NOTE — Chronic Care Management (AMB) (Signed)
  Chronic Care Management   Note  06/02/2019 Name: [name] MRN: [id number] DOB: 01/18/1946  Samantha Huerta is a 74 y.o. year old female who is a primary care patient of Rakes, Connye Burkitt, FNP. I reached out to Domenic Polite by phone today in response to a referral sent by Samantha Huerta health plan.     Samantha Huerta was given information about Chronic Care Management services today including:  1. CCM service includes personalized support from designated clinical staff supervised by her physician, including individualized plan of care and coordination with other care providers 2. 24/7 contact phone numbers for assistance for urgent and routine care needs. 3. Service will only be billed when office clinical staff spend 20 minutes or more in a month to coordinate care. 4. Only one practitioner may furnish and bill the service in a calendar month. 5. The patient may stop CCM services at any time (effective at the end of the month) by phone call to the office staff. 6. The patient will be responsible for cost sharing (co-pay) of up to 20% of the service fee (after annual deductible is met).  Patient did not agree to enrollment in care management services and does not wish to consider at this time.  Follow up plan: The patient has been provided with contact information for the care management team and has been advised to call with any health related questions or concerns.   Noreene Larsson, [address], [place], [place] [address] Direct Dial: [phone] [email or web address] Website: [email or web address]

## 2019-06-18 ENCOUNTER — Other Ambulatory Visit: Payer: Self-pay

## 2019-06-18 ENCOUNTER — Ambulatory Visit (INDEPENDENT_AMBULATORY_CARE_PROVIDER_SITE_OTHER): Payer: Medicare Other | Admitting: Family Medicine

## 2019-06-18 ENCOUNTER — Encounter: Payer: Self-pay | Admitting: Family Medicine

## 2019-06-18 VITALS — BP 161/79 | HR 112 | Temp 99.3°F | Ht 65.0 in | Wt 119.6 lb

## 2019-06-18 DIAGNOSIS — G8929 Other chronic pain: Secondary | ICD-10-CM

## 2019-06-18 DIAGNOSIS — M545 Low back pain: Secondary | ICD-10-CM

## 2019-06-18 DIAGNOSIS — I10 Essential (primary) hypertension: Secondary | ICD-10-CM

## 2019-06-18 DIAGNOSIS — Z0289 Encounter for other administrative examinations: Secondary | ICD-10-CM

## 2019-06-18 DIAGNOSIS — R911 Solitary pulmonary nodule: Secondary | ICD-10-CM

## 2019-06-18 MED ORDER — HYDROCODONE-ACETAMINOPHEN 5-325 MG PO TABS: 1.0000 | ORAL_TABLET | Freq: Two times a day (BID) | ORAL | 0 refills | Status: DC | PRN

## 2019-06-18 NOTE — Patient Instructions (Signed)

## 2019-06-18 NOTE — Progress Notes (Signed)
Subjective:  Patient ID: Samantha Huerta, female    DOB: 10-23-1945, 74 y.o.   MRN: [id number]  Patient Care Team: Sharion Balloon, FNP as PCP - General (Family Medicine) Gala Romney Cristopher Estimable, MD as Consulting Physician (Gastroenterology) Amedeo Kinsman, OD (Optometry)   Chief Complaint:  Medical Management of Chronic Issues, COPD, Hypertension, and Hyperlipidemia   HPI: Samantha Huerta is a 74 y.o. female presenting on 06/18/2019 for Medical Management of Chronic Issues, COPD, Hypertension, and Hyperlipidemia   1. Encounter for chronic pain management 2. Chronic midline low back pain without sciatica 3. Pain management contract signed Pain assessment: Cause of pain- arthritis Pain location- lower back, right wrist Pain on scale of 1-10- 4-7/10 daily Frequency- daily What increases pain-activity, work, lifting, certain movements What makes pain Better-rest, heat, medications  Effects on ADL - minimal Any change in general medical condition-none  Current opioids rx- Hydrocodone 5 / 325 mg 1-2 times per day as needed # meds rx- #45 Effectiveness of current meds-fairly good, some days pain is worse than others Adverse reactions form pain meds-none Morphine equivalent- 10 MEDD  Pill count performed-No Last drug screen - 06/11/2018 ( high risk q32m moderate risk q658mlow risk yearly ) Urine drug screen today- Yes [place]  If yes were their any concerning findings? - no  Opioid Risk  12/25/2018  Alcohol 0  Illegal Drugs 0  Rx Drugs 0  Alcohol 3  Illegal Drugs 0  Rx Drugs 0  Age between 16-45 years  0  History of Preadolescent Sexual Abuse 0  Psychological Disease 2  ADD Negative  OCD Negative  Bipolar Negative  Depression 0  Opioid Risk Tool Scoring 5  Opioid Risk Interpretation Moderate Risk     Pain contract signed on: 06/18/2019   4. Lung nodule Current every day smoker with history of lung nodule. Last CT 07/01/2018. Due for  surveillance imaging. Will order today. Pt still has intermittent cough at times. No hemoptysis or weight loss. No fever, chills, or weakness.      Relevant past medical, surgical, family, and social history reviewed and updated as indicated.  Allergies and medications reviewed and updated. Date reviewed: Chart in Epic.   Past Medical History:  Diagnosis Date  . Anxiety   . Arthritis   . COPD (chronic obstructive pulmonary disease) [place]  . GERD (gastroesophageal reflux disease)   . Glaucoma 1998   Dr [facility]aren [facility]. Hyperlipidemia   . Hypertension   . Osteopenia     Past Surgical History:  Procedure Laterality Date  . ABDOMINAL HYSTERECTOMY    . CARPAL TUNNEL RELEASE Bilateral   . COLONOSCOPY WITH PROPOFOL N/A 10/20/2017   Procedure: COLONOSCOPY WITH PROPOFOL;  Surgeon: RoDaneil DolinMD;  Location: [facility];  Service: Endoscopy;  Laterality: N/A;  1:45pm  . ELBOW SURGERY Right   . SHOULDER SURGERY Left     Social History   Socioeconomic History  . Marital status: Divorced    Spouse name: Not on file  . Number of children: 2  . Years of education: 10  . Highest education level: 10th grade  Occupational History  . Not on file  Tobacco Use  . Smoking status: Current Every Day Smoker    Packs/day: 0.50    Years: 53.00    Pack years: 26.50    Types: Cigarettes  . Smokeless tobacco: Never Used  Substance and Sexual Activity  .  Alcohol use: Yes    Alcohol/week: 5.0 standard drinks    Types: 1 Cans of beer, 4 Shots of liquor per week  . Drug use: No  . Sexual activity: Not Currently    Birth control/protection: Surgical    Comment: hyst  Other Topics Concern  . Not on file  Social History Narrative   Lives at home alone. Feels safe there. She has an alarm system. She has two children. Her son lives locally and her daughter lives in [place]. She has four granddaughters. She also has two nieces that live locally that she has dinner with on  occasion and they will sometimes accompany her to doctor's appointments. She works at the [employer] in [place] about 30 hours a week. She enjoys working and gets a of physical activity there. 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    Social Determinants of Health   Financial Resource Strain:   . Difficulty of Paying Living Expenses:   Food Insecurity:   . Worried About Charity fundraiser in the Last Year:   . Arboriculturist in the Last Year:   Transportation Needs:   . Film/video editor (Medical):   Marland Kitchen Lack of Transportation (Non-Medical):   Physical Activity:   . Days of Exercise per Week:   . Minutes of Exercise per Session:   Stress:   . Feeling of Stress :   Social Connections:   . Frequency of Communication with Friends and Family:   . Frequency of Social Gatherings with Friends and Family:   . Attends Religious Services:   . Active Member of Clubs or Organizations:   . Attends Archivist Meetings:   Marland Kitchen Marital Status:   Intimate Partner Violence:   . Fear of Current or Ex-Partner:   . Emotionally Abused:   Marland Kitchen Physically Abused:   . Sexually Abused:     Outpatient Encounter Medications as of 06/18/2019  Medication Sig  . albuterol (VENTOLIN HFA) 108 (90 Base) MCG/ACT inhaler Inhale 2 puffs into the lungs every 4 (four) hours as needed for wheezing or shortness of breath.  Marland Kitchen amLODipine (NORVASC) 5 MG tablet TAKE ONE (1) TABLET EACH DAY  . aspirin EC 81 MG tablet Take 1 tablet (81 mg total) by mouth daily.  . brinzolamide (AZOPT) 1 % ophthalmic suspension Place 1 drop into both eyes 2 (two) times daily.  . Cholecalciferol (VITAMIN D3) 100000 UNIT/GM POWD Take by mouth.  . Fluticasone-Salmeterol (ADVAIR) 250-50 MCG/DOSE AEPB USE 1 INHALATION TWICE DAILY  . [START ON 08/18/2019] HYDROcodone-acetaminophen (NORCO) 5-325 MG tablet Take 1 tablet by mouth 2 (two) times daily as needed for moderate pain.  Derrill Memo ON 07/19/2019]  HYDROcodone-acetaminophen (NORCO) 5-325 MG tablet Take 1 tablet by mouth 2 (two) times daily as needed for moderate pain.  Marland Kitchen HYDROcodone-acetaminophen (NORCO/VICODIN) 5-325 MG tablet Take 1 tablet by mouth 2 (two) times daily as needed for moderate pain.  Marland Kitchen latanoprost (XALATAN) 0.005 % ophthalmic solution Place 1 drop into both eyes at bedtime.  Marland Kitchen lisinopril (ZESTRIL) 40 MG tablet TAKE ONE (1) TABLET EACH DAY  . Multiple Vitamins-Minerals (MULTIVITAMIN WITH MINERALS) tablet Take 1 tablet by mouth daily.  . naloxone (NARCAN) nasal spray 4 mg/0.1 mL Use as needed for respiratory depression or narcotic overdose  . omeprazole (PRILOSEC) 20 MG capsule Take 1 capsule (20 mg total) by mouth daily as needed.  . ondansetron (ZOFRAN) 4 MG tablet Take 1 tablet (4 mg total)  by mouth every 8 (eight) hours as needed for nausea or vomiting.  . pravastatin (PRAVACHOL) 40 MG tablet Take 1 tablet (40 mg total) by mouth daily.  . [DISCONTINUED] HYDROcodone-acetaminophen (NORCO) 5-325 MG tablet Take 1 tablet by mouth 2 (two) times daily as needed for moderate pain.  . [DISCONTINUED] HYDROcodone-acetaminophen (NORCO) 5-325 MG tablet Take 1 tablet by mouth 2 (two) times daily as needed for moderate pain.  . [DISCONTINUED] HYDROcodone-acetaminophen (NORCO/VICODIN) 5-325 MG tablet Take 1 tablet by mouth 2 (two) times daily as needed for moderate pain.   No facility-administered encounter medications on file as of 06/18/2019.    No Known Allergies  Review of Systems  Constitutional: Negative for activity change, appetite change, chills, diaphoresis, fatigue and unexpected weight change.  HENT: Negative.   Eyes: Negative.  Negative for photophobia and visual disturbance.  Respiratory: Positive for cough and wheezing. Negative for chest tightness and shortness of breath.   Cardiovascular: Negative for chest pain, palpitations and leg swelling.  Gastrointestinal: Negative for abdominal pain, blood in stool, constipation,  diarrhea, nausea and vomiting.  Endocrine: Negative.  Negative for polydipsia, polyphagia and polyuria.  Genitourinary: Negative for decreased urine volume, difficulty urinating, dysuria, frequency and urgency.  Musculoskeletal: Positive for arthralgias, back pain and myalgias. Negative for gait problem, joint swelling, neck pain and neck stiffness.  Skin: Negative.   Allergic/Immunologic: Negative.   Neurological: Negative for dizziness, tremors, seizures, syncope, facial asymmetry, speech difficulty, weakness, light-headedness, numbness and headaches.  Hematological: Negative.   Psychiatric/Behavioral: Negative for confusion, hallucinations, sleep disturbance and suicidal ideas.  All other systems reviewed and are negative.       Objective:  BP (!) 161/79   Pulse (!) 112   Temp 99.3 F (37.4 C)   Ht '5\' 5"'$  (1.651 m)   Wt 119 lb 9.6 oz (54.3 kg)   SpO2 98%   BMI 19.90 kg/m    Wt Readings from Last 3 Encounters:  06/18/19 119 lb 9.6 oz (54.3 kg)  03/25/19 120 lb (54.4 kg)  09/15/18 126 lb 6.4 oz (57.3 kg)    Physical Exam Vitals and nursing note reviewed.  Constitutional:      General: She is not in acute distress.    Appearance: Normal appearance. She is well-developed, well-groomed and normal weight. She is not ill-appearing, toxic-appearing or diaphoretic.  HENT:     Head: Normocephalic and atraumatic.     Jaw: There is normal jaw occlusion.     Right Ear: Hearing normal.     Left Ear: Hearing normal.     Nose: Nose normal.     Mouth/Throat:     Lips: Pink.     Mouth: Mucous membranes are moist.     Pharynx: Oropharynx is clear. Uvula midline.  Eyes:     General: Lids are normal.     Extraocular Movements: Extraocular movements intact.     Conjunctiva/sclera: Conjunctivae normal.     Pupils: Pupils are equal, round, and reactive to light.  Neck:     Thyroid: No thyroid mass, thyromegaly or thyroid tenderness.     Vascular: No carotid bruit or JVD.     Trachea:  Trachea and phonation normal.  Cardiovascular:     Rate and Rhythm: Normal rate and regular rhythm.     Chest Wall: PMI is not displaced.     Pulses: Normal pulses.     Heart sounds: Normal heart sounds. No murmur. No friction rub. No gallop.   Pulmonary:     Effort:  Pulmonary effort is normal. No respiratory distress.     Breath sounds: Wheezing present.  Abdominal:     General: Bowel sounds are normal. There is no distension or abdominal bruit.     Palpations: Abdomen is soft. There is no hepatomegaly or splenomegaly.     Tenderness: There is no abdominal tenderness. There is no right CVA tenderness or left CVA tenderness.     Hernia: No hernia is present.  Musculoskeletal:     Right elbow: Normal.     Right forearm: Normal.     Right wrist: Tenderness present. No swelling, deformity, effusion, lacerations, bony tenderness, snuff box tenderness or crepitus. Decreased range of motion. Normal pulse.     Right hand: Normal.     Cervical back: Normal range of motion and neck supple.     Thoracic back: Normal.     Lumbar back: Tenderness present. No swelling, edema, deformity, signs of trauma, lacerations, spasms or bony tenderness. Decreased range of motion. Negative right straight leg raise test and negative left straight leg raise test. No scoliosis.     Right hip: Normal.     Left hip: Normal.     Right lower leg: No edema.     Left lower leg: No edema.  Lymphadenopathy:     Cervical: No cervical adenopathy.  Skin:    General: Skin is warm and dry.     Capillary Refill: Capillary refill takes less than 2 seconds.     Coloration: Skin is not cyanotic, jaundiced or pale.     Findings: No rash.  Neurological:     General: No focal deficit present.     Mental Status: She is alert and oriented to person, place, and time.     Cranial Nerves: Cranial nerves are intact. No cranial nerve deficit.     Sensory: Sensation is intact. No sensory deficit.     Motor: Motor function is intact.  No weakness.     Coordination: Coordination is intact. Coordination normal.     Gait: Gait is intact. Gait normal.     Deep Tendon Reflexes: Reflexes are normal and symmetric. Reflexes normal.  Psychiatric:        Attention and Perception: Attention and perception normal.        Mood and Affect: Mood and affect normal.        Speech: Speech normal.        Behavior: Behavior normal. Behavior is cooperative.        Thought Content: Thought content normal.        Cognition and Memory: Cognition and memory normal.        Judgment: Judgment normal.     Results for orders placed or performed in visit on 03/25/19  CBC with Differential/Platelet  Result Value Ref Range   WBC 6.0 3.4 - 10.8 x10E3/uL   RBC 4.10 3.77 - 5.28 x10E6/uL   Hemoglobin 12.9 11.1 - 15.9 g/dL   Hematocrit 39.3 34.0 - 46.6 %   MCV 96 79 - 97 fL   MCH 31.5 26.6 - 33.0 pg   MCHC 32.8 31.5 - 35.7 g/dL   RDW 11.6 (L) 11.7 - 15.4 %   Platelets 536 (H) 150 - 450 x10E3/uL   Neutrophils 67 Not Estab. %   Lymphs 18 Not Estab. %   Monocytes 13 Not Estab. %   Eos 1 Not Estab. %   Basos 1 Not Estab. %   Neutrophils Absolute 4.0 1.4 - 7.0 x10E3/uL   Lymphocytes  Absolute 1.0 0.7 - 3.1 x10E3/uL   Monocytes Absolute 0.8 0.1 - 0.9 x10E3/uL   EOS (ABSOLUTE) 0.1 0.0 - 0.4 x10E3/uL   Basophils Absolute 0.1 0.0 - 0.2 x10E3/uL   Immature Granulocytes 0 Not Estab. %   Immature Grans (Abs) 0.0 0.0 - 0.1 x10E3/uL  CMP14+EGFR  Result Value Ref Range   Glucose 136 (H) 65 - 99 mg/dL   BUN 8 8 - 27 mg/dL   Creatinine, Ser 0.74 0.57 - 1.00 mg/dL   GFR calc non Af Amer 80 >59 mL/min/1.73   GFR calc Af Amer 92 >59 mL/min/1.73   BUN/Creatinine Ratio 11 (L) 12 - 28   Sodium 135 134 - 144 mmol/L   Potassium 4.7 3.5 - 5.2 mmol/L   Chloride 98 96 - 106 mmol/L   CO2 22 20 - 29 mmol/L   Calcium 10.1 8.7 - 10.3 mg/dL   Total Protein 6.8 6.0 - 8.5 g/dL   Albumin 4.4 3.7 - 4.7 g/dL   Globulin, Total 2.4 1.5 - 4.5 g/dL   Albumin/Globulin  Ratio 1.8 1.2 - 2.2   Bilirubin Total 0.3 0.0 - 1.2 mg/dL   Alkaline Phosphatase 75 39 - 117 IU/L   AST 27 0 - 40 IU/L   ALT 18 0 - 32 IU/L  Lipid panel  Result Value Ref Range   Cholesterol, Total 212 (H) 100 - 199 mg/dL   Triglycerides 61 0 - 149 mg/dL   HDL 119 >39 mg/dL   VLDL Cholesterol Cal 11 5 - 40 mg/dL   LDL Chol Calc (NIH) 82 0 - 99 mg/dL   Chol/HDL Ratio 1.8 0.0 - 4.4 ratio  Thyroid Panel With TSH  Result Value Ref Range   TSH 1.040 0.450 - 4.500 uIU/mL   T4, Total 5.3 4.5 - 12.0 ug/dL   T3 Uptake Ratio 28 24 - 39 %   Free Thyroxine Index 1.5 1.2 - 4.9  Vitamin D 25 hydroxy  Result Value Ref Range   Vit D, 25-Hydroxy 31.1 30.0 - 100.0 ng/mL       Pertinent labs & imaging results that were available during my care of the patient were reviewed by me and considered in my medical decision making.  Assessment & Plan:  Samantha Huerta was seen today for medical management of chronic issues, copd, hypertension and hyperlipidemia.  Diagnoses and all orders for this visit:  Encounter for chronic pain management Chronic midline low back pain without sciatica Pain management contract signed Toxassure ordered today. Doing well with below, will continue.  -     HYDROcodone-acetaminophen (NORCO) 5-325 MG tablet; Take 1 tablet by mouth 2 (two) times daily as needed for moderate pain. -     HYDROcodone-acetaminophen (NORCO) 5-325 MG tablet; Take 1 tablet by mouth 2 (two) times daily as needed for moderate pain. -     HYDROcodone-acetaminophen (NORCO/VICODIN) 5-325 MG tablet; Take 1 tablet by mouth 2 (two) times daily as needed for moderate pain.  Lung nodule History of lung nodules, will order surveillance imaging today.  -     CT CHEST NODULE FOLLOW UP LOW DOSE W/O; Future  Essential hypertension Pt reports good control at home, states readings are in the 120-130/70-80 range. Pt provided log today and will bring to next visit. DASH diet and exercise encouraged.     Continue all  other maintenance medications.  Follow up plan: Return in about 3 months (around 09/17/2019), or if symptoms worsen or fail to improve, for chronic pain, HTN.  Continue healthy  lifestyle choices, including diet (rich in fruits, vegetables, and lean proteins, and low in salt and simple carbohydrates) and exercise (at least 30 minutes of moderate physical activity daily).  Educational handout given for chronic pain  The above assessment and management plan was discussed with the patient. The patient verbalized understanding of and has agreed to the management plan. Patient is aware to call the clinic if they develop any new symptoms or if symptoms persist or worsen. Patient is aware when to return to the clinic for a follow-up visit. Patient educated on when it is appropriate to go to the emergency department.   Monia Pouch, FNP-C [facility] [phone]

## 2019-06-21 LAB — TOXASSURE SELECT 13 (MW), URINE

## 2019-06-25 ENCOUNTER — Ambulatory Visit: Payer: Medicare Other | Admitting: Family Medicine

## 2019-07-07 ENCOUNTER — Other Ambulatory Visit: Payer: Self-pay

## 2019-07-07 ENCOUNTER — Telehealth: Payer: Self-pay

## 2019-07-07 DIAGNOSIS — R918 Other nonspecific abnormal finding of lung field: Secondary | ICD-10-CM

## 2019-07-07 DIAGNOSIS — R911 Solitary pulmonary nodule: Secondary | ICD-10-CM

## 2019-07-07 NOTE — Telephone Encounter (Signed)
Jennifer with [facility] called needing to change CT order. Pt has not had a Lung Cancer screening. Therefore pt cannot have a CT Nodule follow ordered. Needs to be changed to CT w/o contrast. Ordered changed. Pt is scheduled tomorrow.

## 2019-07-08 ENCOUNTER — Other Ambulatory Visit: Payer: Self-pay

## 2019-07-08 ENCOUNTER — Ambulatory Visit (HOSPITAL_COMMUNITY)
Admission: RE | Admit: 2019-07-08 | Discharge: 2019-07-08 | Disposition: A | Discharge status: Home / Self Care | Payer: Medicare Other | Source: Ambulatory Visit | Attending: Family Medicine | Admitting: Family Medicine

## 2019-07-08 DIAGNOSIS — R911 Solitary pulmonary nodule: Secondary | ICD-10-CM | POA: Insufficient documentation

## 2019-07-08 DIAGNOSIS — R918 Other nonspecific abnormal finding of lung field: Secondary | ICD-10-CM | POA: Diagnosis not present

## 2019-07-14 NOTE — Addendum Note (Signed)
Addended by: Cleda Daub on: 07/14/2019 11:56 AM   Modules accepted: Orders

## 2019-07-27 ENCOUNTER — Ambulatory Visit (HOSPITAL_COMMUNITY): Payer: Medicare Other

## 2019-07-28 ENCOUNTER — Other Ambulatory Visit: Payer: Self-pay | Admitting: *Deleted

## 2019-07-28 DIAGNOSIS — J449 Chronic obstructive pulmonary disease, unspecified: Secondary | ICD-10-CM

## 2019-07-28 MED ORDER — FLUTICASONE-SALMETEROL 250-50 MCG/DOSE IN AEPB: INHALATION_SPRAY | RESPIRATORY_TRACT | 2 refills | Status: DC

## 2019-08-10 ENCOUNTER — Other Ambulatory Visit: Payer: Self-pay

## 2019-08-10 DIAGNOSIS — E782 Mixed hyperlipidemia: Secondary | ICD-10-CM

## 2019-08-10 MED ORDER — PRAVASTATIN SODIUM 40 MG PO TABS: 40.0000 mg | ORAL_TABLET | Freq: Every day | ORAL | 0 refills | Status: DC

## 2019-09-14 DIAGNOSIS — S52502A Unspecified fracture of the lower end of left radius, initial encounter for closed fracture: Secondary | ICD-10-CM | POA: Diagnosis not present

## 2019-09-17 DIAGNOSIS — S52532A Colles' fracture of left radius, initial encounter for closed fracture: Secondary | ICD-10-CM | POA: Diagnosis not present

## 2019-09-21 ENCOUNTER — Other Ambulatory Visit: Payer: Self-pay | Admitting: *Deleted

## 2019-09-21 DIAGNOSIS — I10 Essential (primary) hypertension: Secondary | ICD-10-CM

## 2019-09-21 MED ORDER — AMLODIPINE BESYLATE 5 MG PO TABS: ORAL_TABLET | ORAL | 1 refills | Status: DC

## 2019-09-21 MED ORDER — LISINOPRIL 40 MG PO TABS: ORAL_TABLET | ORAL | 1 refills | Status: DC

## 2019-09-22 DIAGNOSIS — S52532P Colles' fracture of left radius, subsequent encounter for closed fracture with malunion: Secondary | ICD-10-CM | POA: Diagnosis not present

## 2019-09-22 DIAGNOSIS — S52502P Unspecified fracture of the lower end of left radius, subsequent encounter for closed fracture with malunion: Secondary | ICD-10-CM | POA: Diagnosis not present

## 2019-09-24 ENCOUNTER — Ambulatory Visit: Payer: Medicare Other | Admitting: Family

## 2019-10-01 ENCOUNTER — Ambulatory Visit: Payer: Medicare Other | Admitting: Family

## 2019-10-21 DIAGNOSIS — S52532P Colles' fracture of left radius, subsequent encounter for closed fracture with malunion: Secondary | ICD-10-CM | POA: Diagnosis not present

## 2019-10-21 DIAGNOSIS — M25632 Stiffness of left wrist, not elsewhere classified: Secondary | ICD-10-CM | POA: Diagnosis not present

## 2019-11-02 ENCOUNTER — Other Ambulatory Visit: Payer: Self-pay | Admitting: Family

## 2019-11-02 DIAGNOSIS — J449 Chronic obstructive pulmonary disease, unspecified: Secondary | ICD-10-CM

## 2019-11-04 ENCOUNTER — Ambulatory Visit: Payer: Medicare Other | Attending: Internal Medicine

## 2019-11-04 DIAGNOSIS — Z23 Encounter for immunization: Secondary | ICD-10-CM

## 2019-11-04 NOTE — Progress Notes (Signed)
° °  Covid-19 Vaccination Clinic  Name:  Samantha Huerta    MRN: [id number] DOB: 10/28/1945  11/04/2019  Samantha Huerta was observed post Covid-19 immunization for 15 minutes without incident. She was provided with Vaccine Information Sheet and instruction to access the V-Safe system.   Samantha Huerta was instructed to call 911 with any severe reactions post vaccine:  Difficulty breathing   Swelling of face and throat   A fast heartbeat   A bad rash all over body   Dizziness and weakness   Immunizations Administered    Name Date Dose VIS Date Route   Pfizer COVID-19 Vaccine 11/04/2019  2:17 PM 0.3 mL 05/05/2018 Intramuscular   Manufacturer: [employer], [employer]   Lot: [id number]   NDC: [address]-0

## 2019-11-18 DIAGNOSIS — S52532P Colles' fracture of left radius, subsequent encounter for closed fracture with malunion: Secondary | ICD-10-CM | POA: Diagnosis not present

## 2019-11-25 ENCOUNTER — Other Ambulatory Visit: Payer: Self-pay | Admitting: Family

## 2019-11-25 ENCOUNTER — Ambulatory Visit: Payer: Medicare Other | Attending: Internal Medicine

## 2019-11-25 DIAGNOSIS — E782 Mixed hyperlipidemia: Secondary | ICD-10-CM

## 2019-11-25 DIAGNOSIS — Z23 Encounter for immunization: Secondary | ICD-10-CM

## 2019-11-25 NOTE — Progress Notes (Signed)
   Covid-19 Vaccination Clinic  Name:  ELLIE BRYAND    MRN: [id number] DOB: Sep 06, 1945  11/25/2019  Ms. Galeno was observed post Covid-19 immunization for 15 minutes without incident. She was provided with Vaccine Information Sheet and instruction to access the V-Safe system.   Ms. Pentland was instructed to call 911 with any severe reactions post vaccine: Marland Kitchen Difficulty breathing  . Swelling of face and throat  . A fast heartbeat  . A bad rash all over body  . Dizziness and weakness   Immunizations Administered    Name Date Dose VIS Date Route   Pfizer COVID-19 Vaccine 11/25/2019  2:15 PM 0.3 mL 05/05/2018 Intramuscular   Manufacturer: [employer], [employer]   Lot: [id number]   NDC: [id number]

## 2019-11-26 ENCOUNTER — Other Ambulatory Visit: Payer: Self-pay | Admitting: *Deleted

## 2019-11-26 DIAGNOSIS — K219 Gastro-esophageal reflux disease without esophagitis: Secondary | ICD-10-CM

## 2019-11-26 MED ORDER — OMEPRAZOLE 20 MG PO CPDR: 20.0000 mg | DELAYED_RELEASE_CAPSULE | Freq: Every day | ORAL | 0 refills | Status: DC | PRN

## 2019-12-02 ENCOUNTER — Encounter: Payer: Self-pay | Admitting: Family

## 2019-12-02 ENCOUNTER — Other Ambulatory Visit: Payer: Self-pay

## 2019-12-02 ENCOUNTER — Ambulatory Visit (INDEPENDENT_AMBULATORY_CARE_PROVIDER_SITE_OTHER): Payer: Medicare Other | Admitting: Family

## 2019-12-02 VITALS — BP 134/81 | HR 110 | Temp 98.2°F | Ht 64.0 in | Wt 106.0 lb

## 2019-12-02 DIAGNOSIS — E559 Vitamin D deficiency, unspecified: Secondary | ICD-10-CM

## 2019-12-02 DIAGNOSIS — I10 Essential (primary) hypertension: Secondary | ICD-10-CM | POA: Diagnosis not present

## 2019-12-02 DIAGNOSIS — I4510 Unspecified right bundle-branch block: Secondary | ICD-10-CM

## 2019-12-02 DIAGNOSIS — K219 Gastro-esophageal reflux disease without esophagitis: Secondary | ICD-10-CM

## 2019-12-02 DIAGNOSIS — M545 Low back pain: Secondary | ICD-10-CM

## 2019-12-02 DIAGNOSIS — Z79899 Other long term (current) drug therapy: Secondary | ICD-10-CM | POA: Diagnosis not present

## 2019-12-02 DIAGNOSIS — Z23 Encounter for immunization: Secondary | ICD-10-CM | POA: Diagnosis not present

## 2019-12-02 DIAGNOSIS — J449 Chronic obstructive pulmonary disease, unspecified: Secondary | ICD-10-CM

## 2019-12-02 DIAGNOSIS — G8929 Other chronic pain: Secondary | ICD-10-CM

## 2019-12-02 DIAGNOSIS — F1120 Opioid dependence, uncomplicated: Secondary | ICD-10-CM

## 2019-12-02 DIAGNOSIS — R000 Tachycardia, unspecified: Secondary | ICD-10-CM

## 2019-12-02 DIAGNOSIS — E782 Mixed hyperlipidemia: Secondary | ICD-10-CM

## 2019-12-02 MED ORDER — HYDROCODONE-ACETAMINOPHEN 5-325 MG PO TABS: 1.0000 | ORAL_TABLET | Freq: Two times a day (BID) | ORAL | 0 refills | Status: DC | PRN

## 2019-12-02 MED ORDER — FLUTICASONE-SALMETEROL 250-50 MCG/DOSE IN AEPB: 1.0000 | INHALATION_SPRAY | Freq: Two times a day (BID) | RESPIRATORY_TRACT | 5 refills | Status: AC

## 2019-12-02 NOTE — Progress Notes (Signed)
Subjective:    Patient ID: Samantha Huerta, female    DOB: 1946-02-25, 74 y.o.   MRN: [id number]  Chief Complaint  Patient presents with  . Medical Management of Chronic Issues    [facility] patient    Pt presents to the office today to establish care. She has not had her pain medication in a few months, but states her pain has worsens and would like to restart this.  Hypertension This is a chronic problem. The current episode started more than 1 year ago. The problem has been resolved since onset. The problem is controlled. Pertinent negatives include no malaise/fatigue, peripheral edema or shortness of breath. Risk factors for coronary artery disease include diabetes mellitus, sedentary lifestyle and smoking/tobacco exposure. The current treatment provides moderate improvement. There is no history of CVA or heart failure.  Back Pain This is a chronic problem. The current episode started more than 1 year ago. The problem occurs intermittently. The problem has been waxing and waning since onset. The pain is present in the lumbar spine. The pain is moderate. She has tried analgesics for the symptoms. The treatment provided moderate relief.  Gastroesophageal Reflux She complains of belching and heartburn. This is a chronic problem. The current episode started more than 1 year ago. The problem occurs rarely. The problem has been waxing and waning. Risk factors include smoking/tobacco exposure. She has tried a PPI for the symptoms. The treatment provided moderate relief.  Nicotine Dependence Presents for follow-up visit. Her urge triggers include company of smokers. The symptoms have been stable. She smokes < 1/2 a pack of cigarettes per day.   COPD PT continues to smoke 1/2 pack a day. Uses Uses Advar BID.    Review of Systems  Constitutional: Negative for malaise/fatigue.  Respiratory: Negative for shortness of breath.   Gastrointestinal: Positive for heartburn.  Musculoskeletal: Positive for  back pain.  All other systems reviewed and are negative.      Objective:   Physical Exam Vitals reviewed.  Constitutional:      General: She is not in acute distress.    Appearance: She is well-developed.     Comments: underweight  HENT:     Head: Normocephalic and atraumatic.     Right Ear: Tympanic membrane normal.     Left Ear: Tympanic membrane normal.  Eyes:     Pupils: Pupils are equal, round, and reactive to light.  Neck:     Thyroid: No thyromegaly.  Cardiovascular:     Rate and Rhythm: Tachycardia present. Rhythm irregular.     Heart sounds: Normal heart sounds. No murmur heard.   Pulmonary:     Effort: Pulmonary effort is normal. No respiratory distress.     Breath sounds: Normal breath sounds. No wheezing.  Abdominal:     General: Bowel sounds are normal. There is no distension.     Palpations: Abdomen is soft.     Tenderness: There is no abdominal tenderness.  Musculoskeletal:        General: No tenderness. Normal range of motion.     Cervical back: Normal range of motion and neck supple.  Skin:    General: Skin is warm and dry.  Neurological:     Mental Status: She is alert and oriented to person, place, and time.     Cranial Nerves: No cranial nerve deficit.     Deep Tendon Reflexes: Reflexes are normal and symmetric.  Psychiatric:        Behavior: Behavior normal.  Thought Content: Thought content normal.        Judgment: Judgment normal.       BP 134/81   Pulse (!) 110   Temp 98.2 F (36.8 C) (Temporal)   Ht 5' 4" (1.626 m)   Wt 106 lb (48.1 kg)   SpO2 99%   BMI 18.19 kg/m      Assessment & Plan:  Samantha Huerta comes in today with chief complaint of Medical Management of Chronic Issues ([facility] patient )   Diagnosis and orders addressed:  1. Encounter for chronic pain management - HYDROcodone-acetaminophen (NORCO/VICODIN) 5-325 MG tablet; Take 1 tablet by mouth 2 (two) times daily as needed for moderate pain.  Dispense: 45  tablet; Refill: 0 - HYDROcodone-acetaminophen (NORCO) 5-325 MG tablet; Take 1 tablet by mouth 2 (two) times daily as needed for moderate pain.  Dispense: 45 tablet; Refill: 0 - HYDROcodone-acetaminophen (NORCO) 5-325 MG tablet; Take 1 tablet by mouth 2 (two) times daily as needed for moderate pain.  Dispense: 45 tablet; Refill: 0 - CMP14+EGFR - CBC with Differential/Platelet  2. Chronic midline low back pain without sciatica - HYDROcodone-acetaminophen (NORCO/VICODIN) 5-325 MG tablet; Take 1 tablet by mouth 2 (two) times daily as needed for moderate pain.  Dispense: 45 tablet; Refill: 0 - HYDROcodone-acetaminophen (NORCO) 5-325 MG tablet; Take 1 tablet by mouth 2 (two) times daily as needed for moderate pain.  Dispense: 45 tablet; Refill: 0 - HYDROcodone-acetaminophen (NORCO) 5-325 MG tablet; Take 1 tablet by mouth 2 (two) times daily as needed for moderate pain.  Dispense: 45 tablet; Refill: 0 - CMP14+EGFR - CBC with Differential/Platelet  3. COPD (chronic obstructive pulmonary disease) with chronic bronchitis (HCC) - Fluticasone-Salmeterol (ADVAIR) 250-50 MCG/DOSE AEPB; Inhale 1 puff into the lungs in the morning and at bedtime.  Dispense: 60 each; Refill: 5 - CMP14+EGFR - CBC with Differential/Platelet  4. Essential hypertension - CMP14+EGFR - CBC with Differential/Platelet  5. Gastroesophageal reflux disease without esophagitis - CMP14+EGFR - CBC with Differential/Platelet  6. Mixed hyperlipidemia - CMP14+EGFR - CBC with Differential/Platelet  7. Vitamin D deficiency - CMP14+EGFR - CBC with Differential/Platelet  8. Uncomplicated opioid dependence ([place]) - ToxASSURE Select 13 (MW), Urine - CMP14+EGFR - CBC with Differential/Platelet  9. Controlled substance agreement signed - ToxASSURE Select 13 (MW), Urine - CMP14+EGFR - CBC with Differential/Platelet  10. Tachycardia - EKG 12-Lead - Ambulatory referral to Cardiology - CMP14+EGFR - CBC with  Differential/Platelet  11. Right bundle branch block (RBBB) Referral to Cardiologists placed - Ambulatory referral to Cardiology - CMP14+EGFR - CBC with Differential/Platelet   Labs pending Patient reviewed in [place] controlled database, no flags noted. Contract and drug screen are up dated today. Health Maintenance reviewed Diet and exercise encouraged  Follow up plan: 3 months    Evelina Dun, FNP

## 2019-12-02 NOTE — Patient Instructions (Signed)

## 2019-12-03 LAB — CBC WITH DIFFERENTIAL/PLATELET
Basophils Absolute: 0 10*3/uL (ref 0.0–0.2)
Basos: 1 %
EOS (ABSOLUTE): 0.1 10*3/uL (ref 0.0–0.4)
Eos: 1 %
Hematocrit: 42.5 % (ref 34.0–46.6)
Hemoglobin: 14.2 g/dL (ref 11.1–15.9)
Immature Grans (Abs): 0 10*3/uL (ref 0.0–0.1)
Immature Granulocytes: 0 %
Lymphocytes Absolute: 1 10*3/uL (ref 0.7–3.1)
Lymphs: 17 %
MCH: 31 pg (ref 26.6–33.0)
MCHC: 33.4 g/dL (ref 31.5–35.7)
MCV: 93 fL (ref 79–97)
Monocytes Absolute: 0.8 10*3/uL (ref 0.1–0.9)
Monocytes: 14 %
Neutrophils Absolute: 4 10*3/uL (ref 1.4–7.0)
Neutrophils: 67 %
Platelets: 288 10*3/uL (ref 150–450)
RBC: 4.58 x10E6/uL (ref 3.77–5.28)
RDW: 12.8 % (ref 11.7–15.4)
WBC: 6 10*3/uL (ref 3.4–10.8)

## 2019-12-03 LAB — CMP14+EGFR
ALT: 26 IU/L (ref 0–32)
AST: 31 IU/L (ref 0–40)
Albumin/Globulin Ratio: 2.2 (ref 1.2–2.2)
Albumin: 4.6 g/dL (ref 3.7–4.7)
Alkaline Phosphatase: 78 IU/L (ref 44–121)
BUN/Creatinine Ratio: 11 — ABNORMAL LOW (ref 12–28)
BUN: 7 mg/dL — ABNORMAL LOW (ref 8–27)
Bilirubin Total: 0.5 mg/dL (ref 0.0–1.2)
CO2: 22 mmol/L (ref 20–29)
Calcium: 10.1 mg/dL (ref 8.7–10.3)
Chloride: 95 mmol/L — ABNORMAL LOW (ref 96–106)
Creatinine, Ser: 0.65 mg/dL (ref 0.57–1.00)
GFR calc Af Amer: 100 mL/min/{1.73_m2} (ref >59)
GFR calc non Af Amer: 87 mL/min/{1.73_m2} (ref >59)
Globulin, Total: 2.1 g/dL (ref 1.5–4.5)
Glucose: 106 mg/dL — ABNORMAL HIGH (ref 65–99)
Potassium: 4.5 mmol/L (ref 3.5–5.2)
Sodium: 133 mmol/L — ABNORMAL LOW (ref 134–144)
Total Protein: 6.7 g/dL (ref 6.0–8.5)

## 2019-12-03 LAB — VITAMIN D 25 HYDROXY (VIT D DEFICIENCY, FRACTURES): Vit D, 25-Hydroxy: 52.4 ng/mL (ref 30.0–100.0)

## 2019-12-08 LAB — TOXASSURE SELECT 13 (MW), URINE

## 2019-12-13 ENCOUNTER — Other Ambulatory Visit: Payer: Self-pay | Admitting: Family

## 2019-12-13 DIAGNOSIS — Z1231 Encounter for screening mammogram for malignant neoplasm of breast: Secondary | ICD-10-CM

## 2020-01-02 DIAGNOSIS — Z13820 Encounter for screening for osteoporosis: Secondary | ICD-10-CM | POA: Diagnosis not present

## 2020-01-31 ENCOUNTER — Encounter: Payer: Self-pay | Admitting: Cardiology

## 2020-01-31 NOTE — Progress Notes (Signed)
Cardiology Office Note  Date: 02/01/2020   ID: Samantha, Huerta 1945/09/16, MRN [id number]  PCP:  [name], FNP  Cardiologist:  Nona Dell, MD Electrophysiologist:  None   Chief Complaint  Patient presents with  . Fast heartbeat    History of Present Illness: Samantha Huerta is a 74 y.o. female referred for cardiology consultation by Ms. Hawks NP for evaluation of tachycardia.  I reviewed the office note from September.  She does not specifically report any sense of palpitations, does have dyspnea on exertion which has been stable, notes that her heart rate is elevated when she checks her vital signs with an automatic cuff at home.  Per my review of the chart, her heart rate has been elevated for at least the last year at various health care checks.  She has multivessel distribution coronary artery calcification by chest CT done back in April.  Otherwise had stable, small benign-appearing pulmonary nodules.  She is on statin therapy and her most recent LDL was 82.  I personally reviewed her ECG today which shows probable sinus tachycardia 121 bpm, rightward axis and RSR' in lead V1 and V2.  I reviewed her lab work, TSH and hemoglobin have been normal.  She is not on any obvious stimulant medications, does have albuterol MDI to use as needed however.  She retired back in April from [employer] in [place], worked there for 19 years.  Past Medical History:  Diagnosis Date  . Anxiety   . Arthritis   . COPD (chronic obstructive pulmonary disease) (HCC)   . GERD (gastroesophageal reflux disease)   . Glaucoma 1998   Dr Carlynn Purl [facility]  . Hyperlipidemia   . Hypertension   . Osteopenia     Past Surgical History:  Procedure Laterality Date  . ABDOMINAL HYSTERECTOMY    . CARPAL TUNNEL RELEASE Bilateral   . COLONOSCOPY WITH PROPOFOL N/A 10/20/2017   Procedure: COLONOSCOPY WITH PROPOFOL;  Surgeon: Corbin Ade, MD;  Location: [facility];  Service:  Endoscopy;  Laterality: N/A;  1:45pm  . ELBOW SURGERY Right   . SHOULDER SURGERY Left     Current Outpatient Medications  Medication Sig Dispense Refill  . albuterol (VENTOLIN HFA) 108 (90 Base) MCG/ACT inhaler Inhale 2 puffs into the lungs every 4 (four) hours as needed for wheezing or shortness of breath. 8.5 g 11  . amLODipine (NORVASC) 5 MG tablet TAKE ONE (1) TABLET EACH DAY 90 tablet 1  . aspirin EC 81 MG tablet Take 1 tablet (81 mg total) by mouth daily. 30 tablet 3  . brinzolamide (AZOPT) 1 % ophthalmic suspension Place 1 drop into both eyes 2 (two) times daily.    . Cholecalciferol (VITAMIN D3) 100000 UNIT/GM POWD Take by mouth.    . Fluticasone-Salmeterol (ADVAIR) 250-50 MCG/DOSE AEPB Inhale 1 puff into the lungs in the morning and at bedtime. 60 each 5  . HYDROcodone-acetaminophen (NORCO) 5-325 MG tablet Take 1 tablet by mouth 2 (two) times daily as needed for moderate pain. 45 tablet 0  . latanoprost (XALATAN) 0.005 % ophthalmic solution Place 1 drop into both eyes at bedtime.    Marland Kitchen lisinopril (ZESTRIL) 40 MG tablet TAKE ONE (1) TABLET EACH DAY 90 tablet 1  . Multiple Vitamins-Minerals (MULTIVITAMIN WITH MINERALS) tablet Take 1 tablet by mouth daily.    . naloxone (NARCAN) nasal spray 4 mg/0.1 mL Use as needed for respiratory depression or narcotic overdose 1 each 3  . omeprazole (  PRILOSEC) 20 MG capsule Take 1 capsule (20 mg total) by mouth daily as needed. 90 capsule 0  . ondansetron (ZOFRAN) 4 MG tablet Take 1 tablet (4 mg total) by mouth every 8 (eight) hours as needed for nausea or vomiting. 20 tablet 0  . pravastatin (PRAVACHOL) 40 MG tablet TAKE ONE (1) TABLET EACH DAY 90 tablet 0   No current facility-administered medications for this visit.   Allergies:  Patient has no known allergies.   Social History: The patient  reports that she has been smoking cigarettes. She has a 26.50 pack-year smoking history. She has never used smokeless tobacco. She reports current alcohol  use of about 5.0 standard drinks of alcohol per week. She reports that she does not use drugs.   Family History: The patient's family history includes Breast cancer (age of onset: 63) in her sister; Diabetes in her sister; Glaucoma in her father, sister, sister, sister, and sister; Hip fracture in her mother; Other in her brother.   ROS: No syncope.  Physical Exam: VS:  BP (!) 148/80   Pulse (!) 121   Ht 5\' 4"  (1.626 m)   Wt 103 lb (46.7 kg)   SpO2 97%   BMI 17.68 kg/m , BMI Body mass index is 17.68 kg/m.  Wt Readings from Last 3 Encounters:  02/01/20 103 lb (46.7 kg)  12/02/19 106 lb (48.1 kg)  06/18/19 119 lb 9.6 oz (54.3 kg)    General: Elderly woman, no distress. HEENT: Conjunctiva and lids normal, wearing a mask. Neck: Supple, no elevated JVP or carotid bruits, no thyromegaly. Lungs: Decreased breath sounds without wheezing. Cardiac: Rapid regular rate and rhythm, no obvious gallop or murmur, no pericardial rub. Abdomen: Soft, nontender, bowel sounds present. Extremities: No pitting edema, distal pulses 2+. Skin: Warm and dry. Musculoskeletal: Mild kyphosis. Neuropsychiatric: Alert and oriented x3, affect grossly appropriate.  ECG:  An ECG dated 12/02/2019 was personally reviewed today and demonstrated:  Sinus tachycardia with right bundle branch block and probable pulmonary disease pattern.  Recent Labwork: 03/25/2019: TSH 1.040 12/02/2019: ALT 26; AST 31; BUN 7; Creatinine, Ser 0.65; Hemoglobin 14.2; Platelets 288; Potassium 4.5; Sodium 133     Component Value Date/Time   CHOL 212 (H) 03/25/2019 1039   CHOL 163 07/27/2012 1731   TRIG 61 03/25/2019 1039   TRIG 99 01/25/2014 1547   TRIG 58 07/27/2012 1731   HDL 119 03/25/2019 1039   HDL 72 01/25/2014 1547   HDL 80 07/27/2012 1731   CHOLHDL 1.8 03/25/2019 1039   LDLCALC 82 03/25/2019 1039   LDLCALC 58 05/11/2013 1500   LDLCALC 71 07/27/2012 1731    Other Studies Reviewed Today:  Chest CT  07/08/2019: IMPRESSION: 1. No acute cardiopulmonary abnormalities. 2. Stable small pulmonary nodules measuring up to 4 mm. No new suspicious lung nodules identified. Imaging findings are compatible with a benign process. These nodules require no further follow-up. This recommendation follows the consensus statement: Guidelines for Management of Small Pulmonary Nodules Detected on CT Images: From the Fleischner Society 2017; Radiology 2017; 284:228-243. 3. Multi vessel coronary artery atherosclerotic calcifications noted.  Aortic Atherosclerosis (ICD10-I70.0).  Assessment and Plan:  1.  Tachycardia, consistent with sinus tachycardia per recent tracings. By chart review heart rate has been elevated for quite some time, she does not specifically report any significant sense of palpitations but does have shortness of breath with baseline COPD as well.  Hemoglobin and TSH normal.  We will obtain a 72-hour Zio patch to investigate heart rate  variability, also an echocardiogram to exclude cardiomyopathy.  2.  Coronary artery calcification by CT imaging, asymptomatic.  She is on statin therapy in the form of Pravachol and her last LDL was 82.  3.  Essential hypertension, on Norvasc and lisinopril.  Medication Adjustments/Labs and Tests Ordered: Current medicines are reviewed at length with the patient today.  Concerns regarding medicines are outlined above.   Tests Ordered: Orders Placed This Encounter  Procedures  . LONG TERM MONITOR (3-14 DAYS)  . EKG 12-Lead  . ECHOCARDIOGRAM COMPLETE    Medication Changes: No orders of the defined types were placed in this encounter.   Disposition:  Follow up test results  Signed, Jonelle Sidle, MD, [facility] 02/01/2020 9:29 AM    [facility] at [facility] [address] [place], [place], [place] [address] Phone: [phone]; Fax: [phone]

## 2020-02-01 ENCOUNTER — Ambulatory Visit (INDEPENDENT_AMBULATORY_CARE_PROVIDER_SITE_OTHER): Payer: Medicare Other | Admitting: Cardiology

## 2020-02-01 ENCOUNTER — Ambulatory Visit (INDEPENDENT_AMBULATORY_CARE_PROVIDER_SITE_OTHER): Payer: Medicare Other

## 2020-02-01 ENCOUNTER — Other Ambulatory Visit: Payer: Self-pay

## 2020-02-01 ENCOUNTER — Telehealth: Payer: Self-pay | Admitting: Cardiology

## 2020-02-01 ENCOUNTER — Encounter: Payer: Self-pay | Admitting: Cardiology

## 2020-02-01 VITALS — BP 148/80 | HR 121 | Ht 64.0 in | Wt 103.0 lb

## 2020-02-01 DIAGNOSIS — I10 Essential (primary) hypertension: Secondary | ICD-10-CM

## 2020-02-01 DIAGNOSIS — R000 Tachycardia, unspecified: Secondary | ICD-10-CM

## 2020-02-01 DIAGNOSIS — R0602 Shortness of breath: Secondary | ICD-10-CM | POA: Diagnosis not present

## 2020-02-01 DIAGNOSIS — I2510 Atherosclerotic heart disease of native coronary artery without angina pectoris: Secondary | ICD-10-CM

## 2020-02-01 NOTE — Telephone Encounter (Signed)
Pre-cert Verification for the following procedure    Echo   DATE:02/08/2020  LOCATION: [facility]

## 2020-02-01 NOTE — Patient Instructions (Addendum)
Medication Instructions:   Your physician recommends that you continue on your current medications as directed. Please refer to the Current Medication list given to you today.  Labwork:  None  Testing/Procedures: Your physician has requested that you have an echocardiogram. Echocardiography is a painless test that uses sound waves to create images of your heart. It provides your doctor with information about the size and shape of your heart and how well your heart's chambers and valves are working. This procedure takes approximately one hour. There are no restrictions for this procedure. Your physician has recommended that you wear a long term monitor for 3 days. Event monitors are medical devices that record the heart's electrical activity. Doctors most often [place] these monitors to diagnose arrhythmias. Arrhythmias are problems with the speed or rhythm of the heartbeat. The monitor is a small, portable device. You can wear one while you do your normal daily activities. This is usually used to diagnose what is causing palpitations/syncope (passing out). [employer] will call you-please answer  Follow-Up:  Your physician recommends that you schedule a follow-up appointment in: pending.   Any Other Special Instructions Will Be Listed Below (If Applicable).  If you need a refill on your cardiac medications before your next appointment, please call your pharmacy.

## 2020-02-01 NOTE — Telephone Encounter (Signed)
Pre-cert Verification for the following procedure     72 hour ZIO AT dx: tachycardia

## 2020-02-02 DIAGNOSIS — R000 Tachycardia, unspecified: Secondary | ICD-10-CM | POA: Diagnosis not present

## 2020-02-05 DIAGNOSIS — R000 Tachycardia, unspecified: Secondary | ICD-10-CM

## 2020-02-07 ENCOUNTER — Ambulatory Visit
Admission: RE | Admit: 2020-02-07 | Discharge: 2020-02-07 | Disposition: A | Discharge status: Home / Self Care | Payer: Medicare Other | Source: Ambulatory Visit | Attending: Family | Admitting: Family

## 2020-02-07 ENCOUNTER — Other Ambulatory Visit: Payer: Self-pay

## 2020-02-07 DIAGNOSIS — Z1231 Encounter for screening mammogram for malignant neoplasm of breast: Secondary | ICD-10-CM

## 2020-02-08 ENCOUNTER — Ambulatory Visit (HOSPITAL_COMMUNITY)
Admission: RE | Admit: 2020-02-08 | Discharge: 2020-02-08 | Disposition: A | Discharge status: Home / Self Care | Payer: Medicare Other | Source: Ambulatory Visit | Attending: Cardiology | Admitting: Cardiology

## 2020-02-08 ENCOUNTER — Other Ambulatory Visit: Payer: Self-pay

## 2020-02-08 DIAGNOSIS — R0602 Shortness of breath: Secondary | ICD-10-CM | POA: Insufficient documentation

## 2020-02-08 DIAGNOSIS — I2510 Atherosclerotic heart disease of native coronary artery without angina pectoris: Secondary | ICD-10-CM | POA: Insufficient documentation

## 2020-02-08 LAB — ECHOCARDIOGRAM COMPLETE
Area-P 1/2: 4.06 cm2
P 1/2 time: 321 msec
S' Lateral: 2.75 cm

## 2020-02-08 NOTE — Progress Notes (Signed)
*  PRELIMINARY RESULTS* Echocardiogram 2D Echocardiogram has been performed.  Stacey Drain 02/08/2020, 1:42 PM

## 2020-02-11 ENCOUNTER — Telehealth: Payer: Self-pay | Admitting: *Deleted

## 2020-02-11 NOTE — Telephone Encounter (Signed)
-----   Message from Jonelle Sidle, MD sent at 02/09/2020  8:20 AM EST ----- Results reviewed.  Overall cardiac function normal, LVEF 55 to 60%.

## 2020-02-11 NOTE — Telephone Encounter (Signed)
Patient informed. Copy sent to PCP °

## 2020-02-14 ENCOUNTER — Other Ambulatory Visit: Payer: Self-pay | Admitting: Family

## 2020-02-14 DIAGNOSIS — R928 Other abnormal and inconclusive findings on diagnostic imaging of breast: Secondary | ICD-10-CM

## 2020-02-21 ENCOUNTER — Other Ambulatory Visit: Payer: Self-pay

## 2020-02-21 DIAGNOSIS — R928 Other abnormal and inconclusive findings on diagnostic imaging of breast: Secondary | ICD-10-CM

## 2020-02-24 ENCOUNTER — Telehealth: Payer: Self-pay | Admitting: *Deleted

## 2020-02-24 NOTE — Telephone Encounter (Signed)
Patient informed. Copy sent to PCP °

## 2020-02-24 NOTE — Telephone Encounter (Signed)
-----   Message from Jonelle Sidle, MD sent at 02/21/2020  4:41 PM EST ----- Results reviewed.  Cardiac monitor shows average heart rate is 98 bpm so she is not persistently tachycardic which was our main concern.  Rare atrial and ventricular ectopy were noted, no significant arrhythmias however.  Overall reassuring.

## 2020-02-28 ENCOUNTER — Emergency Department (HOSPITAL_COMMUNITY): Payer: Medicare Other

## 2020-02-28 ENCOUNTER — Inpatient Hospital Stay: Payer: Self-pay

## 2020-02-28 ENCOUNTER — Other Ambulatory Visit: Payer: Self-pay

## 2020-02-28 ENCOUNTER — Inpatient Hospital Stay (HOSPITAL_COMMUNITY)
Admission: EM | Admit: 2020-02-28 | Discharge: 2020-03-10 | DRG: 388 | Disposition: A | Discharge status: Skilled Nursing Facility | Payer: Medicare Other | Attending: Family Medicine | Admitting: Family Medicine

## 2020-02-28 ENCOUNTER — Inpatient Hospital Stay (HOSPITAL_COMMUNITY): Payer: Medicare Other

## 2020-02-28 DIAGNOSIS — E43 Unspecified severe protein-calorie malnutrition: Secondary | ICD-10-CM | POA: Insufficient documentation

## 2020-02-28 DIAGNOSIS — G9341 Metabolic encephalopathy: Secondary | ICD-10-CM | POA: Diagnosis present

## 2020-02-28 DIAGNOSIS — J690 Pneumonitis due to inhalation of food and vomit: Secondary | ICD-10-CM | POA: Diagnosis not present

## 2020-02-28 DIAGNOSIS — I499 Cardiac arrhythmia, unspecified: Secondary | ICD-10-CM | POA: Diagnosis not present

## 2020-02-28 DIAGNOSIS — J449 Chronic obstructive pulmonary disease, unspecified: Secondary | ICD-10-CM | POA: Diagnosis not present

## 2020-02-28 DIAGNOSIS — K56609 Unspecified intestinal obstruction, unspecified as to partial versus complete obstruction: Secondary | ICD-10-CM

## 2020-02-28 DIAGNOSIS — E861 Hypovolemia: Secondary | ICD-10-CM | POA: Diagnosis present

## 2020-02-28 DIAGNOSIS — I4891 Unspecified atrial fibrillation: Secondary | ICD-10-CM | POA: Diagnosis not present

## 2020-02-28 DIAGNOSIS — Z743 Need for continuous supervision: Secondary | ICD-10-CM | POA: Diagnosis not present

## 2020-02-28 DIAGNOSIS — R531 Weakness: Secondary | ICD-10-CM | POA: Diagnosis not present

## 2020-02-28 DIAGNOSIS — J440 Chronic obstructive pulmonary disease with acute lower respiratory infection: Secondary | ICD-10-CM | POA: Diagnosis not present

## 2020-02-28 DIAGNOSIS — H409 Unspecified glaucoma: Secondary | ICD-10-CM | POA: Diagnosis not present

## 2020-02-28 DIAGNOSIS — F17210 Nicotine dependence, cigarettes, uncomplicated: Secondary | ICD-10-CM | POA: Diagnosis not present

## 2020-02-28 DIAGNOSIS — R571 Hypovolemic shock: Secondary | ICD-10-CM | POA: Diagnosis present

## 2020-02-28 DIAGNOSIS — R109 Unspecified abdominal pain: Secondary | ICD-10-CM | POA: Diagnosis not present

## 2020-02-28 DIAGNOSIS — J90 Pleural effusion, not elsewhere classified: Secondary | ICD-10-CM | POA: Diagnosis not present

## 2020-02-28 DIAGNOSIS — R140 Abdominal distension (gaseous): Secondary | ICD-10-CM | POA: Diagnosis not present

## 2020-02-28 DIAGNOSIS — R112 Nausea with vomiting, unspecified: Secondary | ICD-10-CM | POA: Diagnosis not present

## 2020-02-28 DIAGNOSIS — J439 Emphysema, unspecified: Secondary | ICD-10-CM | POA: Diagnosis not present

## 2020-02-28 DIAGNOSIS — I9589 Other hypotension: Secondary | ICD-10-CM | POA: Diagnosis present

## 2020-02-28 DIAGNOSIS — N179 Acute kidney failure, unspecified: Secondary | ICD-10-CM

## 2020-02-28 DIAGNOSIS — Z7189 Other specified counseling: Secondary | ICD-10-CM | POA: Diagnosis not present

## 2020-02-28 DIAGNOSIS — F419 Anxiety disorder, unspecified: Secondary | ICD-10-CM | POA: Diagnosis present

## 2020-02-28 DIAGNOSIS — E875 Hyperkalemia: Secondary | ICD-10-CM | POA: Diagnosis present

## 2020-02-28 DIAGNOSIS — Z79899 Other long term (current) drug therapy: Secondary | ICD-10-CM

## 2020-02-28 DIAGNOSIS — E876 Hypokalemia: Secondary | ICD-10-CM | POA: Diagnosis present

## 2020-02-28 DIAGNOSIS — I10 Essential (primary) hypertension: Secondary | ICD-10-CM | POA: Diagnosis not present

## 2020-02-28 DIAGNOSIS — F10231 Alcohol dependence with withdrawal delirium: Secondary | ICD-10-CM | POA: Diagnosis present

## 2020-02-28 DIAGNOSIS — R21 Rash and other nonspecific skin eruption: Secondary | ICD-10-CM | POA: Diagnosis not present

## 2020-02-28 DIAGNOSIS — K565 Intestinal adhesions [bands], unspecified as to partial versus complete obstruction: Principal | ICD-10-CM | POA: Diagnosis present

## 2020-02-28 DIAGNOSIS — F1027 Alcohol dependence with alcohol-induced persisting dementia: Secondary | ICD-10-CM | POA: Diagnosis present

## 2020-02-28 DIAGNOSIS — Z4682 Encounter for fitting and adjustment of non-vascular catheter: Secondary | ICD-10-CM | POA: Diagnosis not present

## 2020-02-28 DIAGNOSIS — G8929 Other chronic pain: Secondary | ICD-10-CM | POA: Diagnosis not present

## 2020-02-28 DIAGNOSIS — M6281 Muscle weakness (generalized): Secondary | ICD-10-CM | POA: Diagnosis not present

## 2020-02-28 DIAGNOSIS — E871 Hypo-osmolality and hyponatremia: Secondary | ICD-10-CM | POA: Diagnosis present

## 2020-02-28 DIAGNOSIS — R58 Hemorrhage, not elsewhere classified: Secondary | ICD-10-CM | POA: Diagnosis not present

## 2020-02-28 DIAGNOSIS — K56699 Other intestinal obstruction unspecified as to partial versus complete obstruction: Secondary | ICD-10-CM | POA: Diagnosis not present

## 2020-02-28 DIAGNOSIS — Z7982 Long term (current) use of aspirin: Secondary | ICD-10-CM

## 2020-02-28 DIAGNOSIS — Z7401 Bed confinement status: Secondary | ICD-10-CM | POA: Diagnosis not present

## 2020-02-28 DIAGNOSIS — K219 Gastro-esophageal reflux disease without esophagitis: Secondary | ICD-10-CM

## 2020-02-28 DIAGNOSIS — R54 Age-related physical debility: Secondary | ICD-10-CM | POA: Diagnosis present

## 2020-02-28 DIAGNOSIS — Z20822 Contact with and (suspected) exposure to covid-19: Secondary | ICD-10-CM | POA: Diagnosis present

## 2020-02-28 DIAGNOSIS — E860 Dehydration: Secondary | ICD-10-CM | POA: Diagnosis present

## 2020-02-28 DIAGNOSIS — A419 Sepsis, unspecified organism: Secondary | ICD-10-CM | POA: Diagnosis not present

## 2020-02-28 DIAGNOSIS — Z83511 Family history of glaucoma: Secondary | ICD-10-CM

## 2020-02-28 DIAGNOSIS — Z515 Encounter for palliative care: Secondary | ICD-10-CM | POA: Diagnosis not present

## 2020-02-28 DIAGNOSIS — Z452 Encounter for adjustment and management of vascular access device: Secondary | ICD-10-CM

## 2020-02-28 DIAGNOSIS — E785 Hyperlipidemia, unspecified: Secondary | ICD-10-CM | POA: Diagnosis present

## 2020-02-28 DIAGNOSIS — R6889 Other general symptoms and signs: Secondary | ICD-10-CM | POA: Diagnosis not present

## 2020-02-28 DIAGNOSIS — K6389 Other specified diseases of intestine: Secondary | ICD-10-CM | POA: Diagnosis not present

## 2020-02-28 DIAGNOSIS — R000 Tachycardia, unspecified: Secondary | ICD-10-CM | POA: Diagnosis not present

## 2020-02-28 DIAGNOSIS — M545 Low back pain, unspecified: Secondary | ICD-10-CM | POA: Diagnosis present

## 2020-02-28 DIAGNOSIS — Z833 Family history of diabetes mellitus: Secondary | ICD-10-CM

## 2020-02-28 DIAGNOSIS — R739 Hyperglycemia, unspecified: Secondary | ICD-10-CM | POA: Diagnosis present

## 2020-02-28 LAB — COMPREHENSIVE METABOLIC PANEL
ALT: 17 U/L (ref 0–44)
ALT: 14 U/L (ref 0–44)
AST: 22 U/L (ref 15–41)
AST: 23 U/L (ref 15–41)
Albumin: 4.1 g/dL (ref 3.5–5.0)
Albumin: 3 g/dL — ABNORMAL LOW (ref 3.5–5.0)
Alkaline Phosphatase: 51 U/L (ref 38–126)
Alkaline Phosphatase: 35 U/L — ABNORMAL LOW (ref 38–126)
Anion gap: 23 — ABNORMAL HIGH (ref 5–15)
Anion gap: 15 (ref 5–15)
BUN: 99 mg/dL — ABNORMAL HIGH (ref 8–23)
BUN: 86 mg/dL — ABNORMAL HIGH (ref 8–23)
CO2: 20 mmol/L — ABNORMAL LOW (ref 22–32)
CO2: 24 mmol/L (ref 22–32)
Calcium: 9.6 mg/dL (ref 8.9–10.3)
Calcium: 8.5 mg/dL — ABNORMAL LOW (ref 8.9–10.3)
Chloride: 86 mmol/L — ABNORMAL LOW (ref 98–111)
Chloride: 90 mmol/L — ABNORMAL LOW (ref 98–111)
Creatinine, Ser: 2.02 mg/dL — ABNORMAL HIGH (ref 0.44–1.00)
Creatinine, Ser: 1.73 mg/dL — ABNORMAL HIGH (ref 0.44–1.00)
GFR, Estimated: 25 mL/min — ABNORMAL LOW (ref >60)
GFR, Estimated: 30 mL/min — ABNORMAL LOW (ref >60)
Glucose, Bld: 113 mg/dL — ABNORMAL HIGH (ref 70–99)
Glucose, Bld: 111 mg/dL — ABNORMAL HIGH (ref 70–99)
Potassium: 3.2 mmol/L — ABNORMAL LOW (ref 3.5–5.1)
Potassium: 3.9 mmol/L (ref 3.5–5.1)
Sodium: 129 mmol/L — ABNORMAL LOW (ref 135–145)
Sodium: 129 mmol/L — ABNORMAL LOW (ref 135–145)
Total Bilirubin: 1.4 mg/dL — ABNORMAL HIGH (ref 0.3–1.2)
Total Bilirubin: 0.8 mg/dL (ref 0.3–1.2)
Total Protein: 6.9 g/dL (ref 6.5–8.1)
Total Protein: 5.5 g/dL — ABNORMAL LOW (ref 6.5–8.1)

## 2020-02-28 LAB — CBC
HCT: 40.5 % (ref 36.0–46.0)
Hemoglobin: 13.9 g/dL (ref 12.0–15.0)
MCH: 33.4 pg (ref 26.0–34.0)
MCHC: 34.3 g/dL (ref 30.0–36.0)
MCV: 97.4 fL (ref 80.0–100.0)
Platelets: 514 10*3/uL — ABNORMAL HIGH (ref 150–400)
RBC: 4.16 MIL/uL (ref 3.87–5.11)
RDW: 12.6 % (ref 11.5–15.5)
WBC: 5.5 10*3/uL (ref 4.0–10.5)
nRBC: 0 % (ref 0.0–0.2)

## 2020-02-28 LAB — URINALYSIS, ROUTINE W REFLEX MICROSCOPIC
Bilirubin Urine: NEGATIVE
Glucose, UA: NEGATIVE mg/dL
Glucose, UA: NEGATIVE mg/dL
Hgb urine dipstick: NEGATIVE
Hgb urine dipstick: NEGATIVE
Ketones, ur: 5 mg/dL — AB
Ketones, ur: 5 mg/dL — AB
Leukocytes,Ua: NEGATIVE
Leukocytes,Ua: NEGATIVE
Nitrite: NEGATIVE
Nitrite: NEGATIVE
Protein, ur: NEGATIVE mg/dL
Protein, ur: NEGATIVE mg/dL
Specific Gravity, Urine: 1.018 (ref 1.005–1.030)
Specific Gravity, Urine: 1.021 (ref 1.005–1.030)
pH: 5 (ref 5.0–8.0)
pH: 5 (ref 5.0–8.0)

## 2020-02-28 LAB — TROPONIN I (HIGH SENSITIVITY)
Troponin I (High Sensitivity): 9 ng/L (ref <18)
Troponin I (High Sensitivity): 11 ng/L (ref <18)

## 2020-02-28 LAB — RESP PANEL BY RT-PCR (FLU A&B, COVID) ARPGX2
Influenza A by PCR: NEGATIVE
Influenza B by PCR: NEGATIVE
SARS Coronavirus 2 by RT PCR: NEGATIVE

## 2020-02-28 LAB — LACTIC ACID, PLASMA
Lactic Acid, Venous: 2.4 mmol/L (ref 0.5–1.9)
Lactic Acid, Venous: 3.5 mmol/L (ref 0.5–1.9)

## 2020-02-28 LAB — CBC WITH DIFFERENTIAL/PLATELET
Abs Immature Granulocytes: 0.03 10*3/uL (ref 0.00–0.07)
Basophils Absolute: 0 10*3/uL (ref 0.0–0.1)
Basophils Relative: 0 %
Eosinophils Absolute: 0 10*3/uL (ref 0.0–0.5)
Eosinophils Relative: 0 %
HCT: 44.6 % (ref 36.0–46.0)
Hemoglobin: 15.2 g/dL — ABNORMAL HIGH (ref 12.0–15.0)
Lymphocytes Relative: 19 %
Lymphs Abs: 0.8 10*3/uL (ref 0.7–4.0)
MCH: 34.1 pg — ABNORMAL HIGH (ref 26.0–34.0)
MCHC: 34.1 g/dL (ref 30.0–36.0)
MCV: 100 fL (ref 80.0–100.0)
Monocytes Absolute: 0.6 10*3/uL (ref 0.1–1.0)
Monocytes Relative: 13 %
Neutro Abs: 3 10*3/uL (ref 1.7–7.7)
Neutrophils Relative %: 68 %
Platelets: 522 10*3/uL — ABNORMAL HIGH (ref 150–400)
RBC: 4.46 MIL/uL (ref 3.87–5.11)
RDW: 12.6 % (ref 11.5–15.5)
WBC: 4.4 10*3/uL (ref 4.0–10.5)
nRBC: 0 % (ref 0.0–0.2)

## 2020-02-28 LAB — POC OCCULT BLOOD, ED: Fecal Occult Bld: NEGATIVE

## 2020-02-28 LAB — LIPASE, BLOOD: Lipase: 36 U/L (ref 11–51)

## 2020-02-28 LAB — MAGNESIUM
Magnesium: 2.1 mg/dL (ref 1.7–2.4)
Magnesium: 2.2 mg/dL (ref 1.7–2.4)

## 2020-02-28 LAB — TYPE AND SCREEN
ABO/RH(D): A POS
Antibody Screen: NEGATIVE

## 2020-02-28 LAB — MRSA PCR SCREENING: MRSA by PCR: POSITIVE — AB

## 2020-02-28 LAB — PROTIME-INR
INR: 0.9 (ref 0.8–1.2)
Prothrombin Time: 12.2 seconds (ref 11.4–15.2)

## 2020-02-28 LAB — PHOSPHORUS: Phosphorus: 5 mg/dL — ABNORMAL HIGH (ref 2.5–4.6)

## 2020-02-28 LAB — APTT: aPTT: 25 seconds (ref 24–36)

## 2020-02-28 MED ORDER — LACTATED RINGERS IV BOLUS
500.0000 mL | Freq: Once | INTRAVENOUS | Status: AC
  Administered 2020-02-28: 12:00:00 500 mL via INTRAVENOUS

## 2020-02-28 MED ORDER — FOLIC ACID 1 MG PO TABS: 1.0000 mg | ORAL_TABLET | Freq: Every day | ORAL | Status: DC

## 2020-02-28 MED ORDER — MUPIROCIN 2 % EX OINT
1.0000 "application " | TOPICAL_OINTMENT | Freq: Two times a day (BID) | CUTANEOUS | Status: AC
  Administered 2020-02-28 – 2020-03-04 (×10): 1 via NASAL
  Filled 2020-02-28 (×2): qty 22

## 2020-02-28 MED ORDER — CHLORHEXIDINE GLUCONATE CLOTH 2 % EX PADS
6.0000 | MEDICATED_PAD | Freq: Every day | CUTANEOUS | Status: DC
  Administered 2020-02-28 – 2020-03-10 (×12): 6 via TOPICAL

## 2020-02-28 MED ORDER — LACTATED RINGERS IV SOLN: INTRAVENOUS | Status: DC

## 2020-02-28 MED ORDER — THIAMINE HCL 100 MG PO TABS: 100.0000 mg | ORAL_TABLET | Freq: Every day | ORAL | Status: AC

## 2020-02-28 MED ORDER — LACTATED RINGERS IV BOLUS
500.0000 mL | Freq: Once | INTRAVENOUS | Status: AC
  Administered 2020-02-28: 11:00:00 500 mL via INTRAVENOUS

## 2020-02-28 MED ORDER — MAGNESIUM SULFATE 2 GM/50ML IV SOLN
2.0000 g | Freq: Once | INTRAVENOUS | Status: AC
  Administered 2020-02-28: 13:00:00 2 g via INTRAVENOUS
  Filled 2020-02-28: qty 50

## 2020-02-28 MED ORDER — HEPARIN SODIUM (PORCINE) 5000 UNIT/ML IJ SOLN
5000.0000 [IU] | Freq: Three times a day (TID) | INTRAMUSCULAR | Status: DC
  Administered 2020-02-28 – 2020-03-09 (×30): 5000 [IU] via SUBCUTANEOUS
  Filled 2020-02-28 (×29): qty 1

## 2020-02-28 MED ORDER — THIAMINE HCL 100 MG/ML IJ SOLN
100.0000 mg | Freq: Every day | INTRAMUSCULAR | Status: AC
  Administered 2020-02-28 – 2020-03-04 (×6): 100 mg via INTRAVENOUS
  Filled 2020-02-28 (×6): qty 2

## 2020-02-28 MED ORDER — MIDODRINE HCL 5 MG PO TABS
10.0000 mg | ORAL_TABLET | Freq: Once | ORAL | Status: AC
  Administered 2020-02-28: 16:00:00 10 mg via ORAL
  Filled 2020-02-28: qty 2

## 2020-02-28 MED ORDER — ALBUTEROL SULFATE (2.5 MG/3ML) 0.083% IN NEBU
2.5000 mg | INHALATION_SOLUTION | RESPIRATORY_TRACT | Status: DC | PRN
  Administered 2020-03-08 (×2): 2.5 mg via RESPIRATORY_TRACT
  Filled 2020-02-28: qty 3

## 2020-02-28 MED ORDER — METRONIDAZOLE IN NACL 5-0.79 MG/ML-% IV SOLN
500.0000 mg | Freq: Once | INTRAVENOUS | Status: AC
  Administered 2020-02-28: 10:00:00 500 mg via INTRAVENOUS
  Filled 2020-02-28: qty 100

## 2020-02-28 MED ORDER — SODIUM CHLORIDE 0.9 % IV BOLUS
250.0000 mL | INTRAVENOUS | Status: DC | PRN
  Administered 2020-02-28 – 2020-03-03 (×2): 250 mL via INTRAVENOUS

## 2020-02-28 MED ORDER — OCTREOTIDE LOAD VIA INFUSION
50.0000 ug | Freq: Once | INTRAVENOUS | Status: AC
  Administered 2020-02-28: 10:00:00 50 ug via INTRAVENOUS
  Filled 2020-02-28: qty 25

## 2020-02-28 MED ORDER — SODIUM CHLORIDE 0.9 % IV SOLN
2.0000 g | Freq: Once | INTRAVENOUS | Status: AC
  Administered 2020-02-28: 10:00:00 2 g via INTRAVENOUS
  Filled 2020-02-28: qty 20

## 2020-02-28 MED ORDER — PANTOPRAZOLE SODIUM 40 MG IV SOLR
40.0000 mg | Freq: Two times a day (BID) | INTRAVENOUS | Status: DC
  Administered 2020-02-28 – 2020-03-09 (×20): 40 mg via INTRAVENOUS
  Filled 2020-02-28 (×20): qty 40

## 2020-02-28 MED ORDER — NOREPINEPHRINE 4 MG/250ML-% IV SOLN
0.0000 ug/min | INTRAVENOUS | Status: DC
  Administered 2020-02-28: 16:00:00 2 ug/min via INTRAVENOUS
  Administered 2020-02-29: 03:00:00 12 ug/min via INTRAVENOUS
  Administered 2020-02-29: 22:00:00 6 ug/min via INTRAVENOUS
  Administered 2020-02-29: 09:00:00 9 ug/min via INTRAVENOUS
  Administered 2020-03-01: 11:00:00 5 ug/min via INTRAVENOUS
  Administered 2020-03-01: 23:00:00 10 ug/min via INTRAVENOUS
  Administered 2020-03-02: 07:00:00 8 ug/min via INTRAVENOUS
  Administered 2020-03-03: 21:00:00 2 ug/min via INTRAVENOUS
  Administered 2020-03-03: 01:00:00 5 ug/min via INTRAVENOUS
  Administered 2020-03-05: 02:00:00 10 ug/min via INTRAVENOUS
  Administered 2020-03-05: 10:00:00 8 ug/min via INTRAVENOUS
  Administered 2020-03-06: 05:00:00 3 ug/min via INTRAVENOUS
  Filled 2020-02-28 (×12): qty 250

## 2020-02-28 MED ORDER — POTASSIUM CHLORIDE 10 MEQ/100ML IV SOLN
10.0000 meq | INTRAVENOUS | Status: AC
  Administered 2020-02-28 (×4): 10 meq via INTRAVENOUS
  Filled 2020-02-28 (×3): qty 100

## 2020-02-28 MED ORDER — PIPERACILLIN-TAZOBACTAM 3.375 G IVPB
3.3750 g | Freq: Two times a day (BID) | INTRAVENOUS | Status: DC
  Administered 2020-02-28 – 2020-02-29 (×2): 3.375 g via INTRAVENOUS
  Filled 2020-02-28 (×2): qty 50

## 2020-02-28 MED ORDER — ONDANSETRON HCL 4 MG PO TABS: 4.0000 mg | ORAL_TABLET | Freq: Four times a day (QID) | ORAL | Status: DC | PRN

## 2020-02-28 MED ORDER — VANCOMYCIN HCL IN DEXTROSE 1-5 GM/200ML-% IV SOLN
1000.0000 mg | Freq: Once | INTRAVENOUS | Status: AC
  Administered 2020-02-28: 21:00:00 1000 mg via INTRAVENOUS
  Filled 2020-02-28: qty 200

## 2020-02-28 MED ORDER — BRINZOLAMIDE 1 % OP SUSP
1.0000 [drp] | Freq: Two times a day (BID) | OPHTHALMIC | Status: DC
  Administered 2020-02-28 – 2020-03-10 (×20): 1 [drp] via OPHTHALMIC
  Filled 2020-02-28: qty 10

## 2020-02-28 MED ORDER — UMECLIDINIUM BROMIDE 62.5 MCG/INH IN AEPB
1.0000 | INHALATION_SPRAY | Freq: Every day | RESPIRATORY_TRACT | Status: DC
  Administered 2020-02-29 – 2020-03-10 (×6): 1 via RESPIRATORY_TRACT
  Filled 2020-02-28: qty 7

## 2020-02-28 MED ORDER — POTASSIUM CHLORIDE 2 MEQ/ML IV SOLN: INTRAVENOUS | Status: DC

## 2020-02-28 MED ORDER — ACETAMINOPHEN 325 MG PO TABS
650.0000 mg | ORAL_TABLET | Freq: Four times a day (QID) | ORAL | Status: DC | PRN
  Administered 2020-03-09: 650 mg via ORAL
  Filled 2020-02-28: qty 2

## 2020-02-28 MED ORDER — LACTATED RINGERS IV BOLUS (SEPSIS)
1000.0000 mL | Freq: Once | INTRAVENOUS | Status: AC
  Administered 2020-02-28: 10:00:00 1000 mL via INTRAVENOUS

## 2020-02-28 MED ORDER — VANCOMYCIN VARIABLE DOSE PER UNSTABLE RENAL FUNCTION (PHARMACIST DOSING): Status: DC

## 2020-02-28 MED ORDER — LATANOPROST 0.005 % OP SOLN
1.0000 [drp] | Freq: Every day | OPHTHALMIC | Status: DC
  Administered 2020-02-28 – 2020-03-09 (×10): 1 [drp] via OPHTHALMIC
  Filled 2020-02-28 (×2): qty 2.5

## 2020-02-28 MED ORDER — SODIUM CHLORIDE 0.9 % IV BOLUS
500.0000 mL | Freq: Once | INTRAVENOUS | Status: AC
  Administered 2020-02-28: 22:00:00 500 mL via INTRAVENOUS

## 2020-02-28 MED ORDER — PANTOPRAZOLE SODIUM 40 MG IV SOLR
40.0000 mg | Freq: Once | INTRAVENOUS | Status: AC
  Administered 2020-02-28: 10:00:00 40 mg via INTRAVENOUS
  Filled 2020-02-28: qty 40

## 2020-02-28 MED ORDER — PIPERACILLIN-TAZOBACTAM 3.375 G IVPB 30 MIN
3.3750 g | Freq: Two times a day (BID) | INTRAVENOUS | Status: DC
  Filled 2020-02-28 (×2): qty 50

## 2020-02-28 MED ORDER — LORAZEPAM 1 MG PO TABS: 1.0000 mg | ORAL_TABLET | ORAL | Status: DC | PRN

## 2020-02-28 MED ORDER — CHLORHEXIDINE GLUCONATE CLOTH 2 % EX PADS
6.0000 | MEDICATED_PAD | Freq: Every day | CUTANEOUS | Status: AC
  Administered 2020-02-29 – 2020-03-04 (×4): 6 via TOPICAL

## 2020-02-28 MED ORDER — NOREPINEPHRINE 4 MG/250ML-% IV SOLN
INTRAVENOUS | Status: AC
  Filled 2020-02-28: qty 250

## 2020-02-28 MED ORDER — ARFORMOTEROL TARTRATE 15 MCG/2ML IN NEBU
15.0000 ug | INHALATION_SOLUTION | Freq: Two times a day (BID) | RESPIRATORY_TRACT | Status: DC
  Administered 2020-02-28 – 2020-03-10 (×22): 15 ug via RESPIRATORY_TRACT
  Filled 2020-02-28 (×23): qty 2

## 2020-02-28 MED ORDER — ADULT MULTIVITAMIN W/MINERALS CH: 1.0000 | ORAL_TABLET | Freq: Every day | ORAL | Status: DC

## 2020-02-28 MED ORDER — LORAZEPAM 2 MG/ML IJ SOLN
1.0000 mg | INTRAMUSCULAR | Status: DC | PRN
  Administered 2020-02-29: 10:00:00 1 mg via INTRAVENOUS
  Administered 2020-02-29 – 2020-03-01 (×2): 2 mg via INTRAVENOUS
  Administered 2020-03-01: 22:00:00 1 mg via INTRAVENOUS
  Administered 2020-03-02: 07:00:00 2 mg via INTRAVENOUS
  Filled 2020-02-28 (×6): qty 1

## 2020-02-28 MED ORDER — LACTATED RINGERS IV BOLUS
250.0000 mL | Freq: Once | INTRAVENOUS | Status: AC
  Administered 2020-02-28: 14:00:00 250 mL via INTRAVENOUS

## 2020-02-28 MED ORDER — MORPHINE SULFATE (PF) 2 MG/ML IV SOLN
1.0000 mg | INTRAVENOUS | Status: DC | PRN
  Administered 2020-02-29 – 2020-03-08 (×18): 1 mg via INTRAVENOUS
  Filled 2020-02-28 (×18): qty 1

## 2020-02-28 MED ORDER — LORAZEPAM 2 MG/ML IJ SOLN
0.5000 mg | Freq: Once | INTRAMUSCULAR | Status: AC
  Administered 2020-02-28: 11:00:00 0.5 mg via INTRAVENOUS
  Filled 2020-02-28: qty 1

## 2020-02-28 MED ORDER — ACETAMINOPHEN 650 MG RE SUPP
650.0000 mg | Freq: Four times a day (QID) | RECTAL | Status: DC | PRN
  Administered 2020-03-03: 13:00:00 650 mg via RECTAL
  Filled 2020-02-28: qty 1

## 2020-02-28 MED ORDER — ONDANSETRON HCL 4 MG/2ML IJ SOLN
4.0000 mg | Freq: Four times a day (QID) | INTRAMUSCULAR | Status: DC | PRN
  Administered 2020-02-28: 22:00:00 4 mg via INTRAVENOUS
  Filled 2020-02-28: qty 2

## 2020-02-28 MED ORDER — SODIUM CHLORIDE 0.9 % IV SOLN
50.0000 ug/h | INTRAVENOUS | Status: DC
  Administered 2020-02-28: 10:00:00 50 ug/h via INTRAVENOUS
  Filled 2020-02-28 (×3): qty 1

## 2020-02-28 NOTE — Progress Notes (Addendum)
Pharmacy Antibiotic Note  Samantha Huerta is a 74 y.o. female admitted on 02/28/2020 with SBO and hypovolemic shock secondary to GI loss due to vomiting; LA 3.5.  Pharmacy has been consulted for Zosyn and vancomycin dosing for sepsis.  WBC 4.4, afebrile; Scr 2.02, CrCl 17.1 ml/min (baseline Scr appears to be ~0.65-0.74; pt with AKI in the setting of hypotension/vomiting and ACE inhibitor use)  Plan: Zosyn 3.375 gm IV Q 12 hrs Vancomycin 1 gm IV X 1; given pt's poor and unstable renal function, will dose vancomycin by levels until renal function stabilizes; check vancomycin random level in 24 hrs (or sooner, if renal function improved with AM labs) to evaluate vancomycin clearance Monitor WBC, temp, clinical improvement, cultures, renal function, vancomycin levels  Height: 5\' 5"  (165.1 cm) Weight: 44.9 kg (98 lb 15.8 oz) IBW/kg (Calculated) : 57  Temp (24hrs), Avg:97.3 F (36.3 C), Min:96.8 F (36 C), Max:97.7 F (36.5 C)  Recent Labs  Lab 02/28/20 0913 02/28/20 1145  WBC 4.4  --   CREATININE 2.02*  --   LATICACIDVEN 2.4* 3.5*    Estimated Creatinine Clearance: 17.1 mL/min (A) (by C-G formula based on SCr of 2.02 mg/dL (H)).    No Known Allergies  Antimicrobials this admission: 12/20 ceftriaxone X 1 12/20 metronidazole IV X 1 12/20 Zosyn >> 12/20 Vancomycin >>  Microbiology results: 12/20 BCx X 2: in process 12/20 UCx: pending 12/20 MRSA PCR: positivie 12/20 COVID, flu A, flu B: negative  Thank you for allowing pharmacy to be a part of this patients care.  1/21, PharmD, BCPS, [facility] Clinical Pharmacist 02/28/2020 6:11 PM

## 2020-02-28 NOTE — ED Provider Notes (Addendum)
[facility] EMERGENCY DEPARTMENT Provider Note   CSN: [id number] Arrival date & time: 02/28/20  7322     History Chief Complaint  Patient presents with  . Nausea  . Weakness    Samantha Huerta is a 74 y.o. female history includes GERD, COPD, hypertension, hyperlipidemia.  Patient arrives today for abdominal pain nausea vomiting and generalized weakness.  Symptoms began 5 days ago she reports multiple episodes of black emesis over the past 5 days associated with generalized abdominal pain a throbbing sensation constant worsened with emesis minimally improved with rest, pain does not radiate.  She is unable to keep food down for the past few days and has been feeling increasingly weak.  She denies similar problems in the past.  Additionally patient reports that she drinks alcohol 5 days a week, denies history of GI bleed.  Denies fever/chills, headache, chest pain/shortness of breath, cough/hemoptysis, dysuria/hematuria, diarrhea, melena, hematochezia, fall/injury or any additional concerns.  HPI     Past Medical History:  Diagnosis Date  . Anxiety   . Arthritis   . COPD (chronic obstructive pulmonary disease) (HCC)   . GERD (gastroesophageal reflux disease)   . Glaucoma 1998   Dr Carlynn Purl in [facility]  . Hyperlipidemia   . Hypertension   . Osteopenia     Patient Active Problem List   Diagnosis Date Noted  . SBO (small bowel obstruction) (HCC) 02/28/2020  . Opiate dependence (HCC) 12/02/2019  . Controlled substance agreement signed 12/02/2019  . Vitamin D deficiency 03/25/2019  . Chronic midline low back pain without sciatica 12/25/2018  . Heme positive stool 07/31/2017  . Lung nodule 04/03/2017  . Pain management contract signed 01/05/2016  . Chronic back pain 05/12/2013  . Hypertension 07/27/2012  . Hyperlipidemia 07/27/2012  . GERD (gastroesophageal reflux disease) 07/27/2012  . COPD (chronic obstructive pulmonary disease) with chronic bronchitis (HCC)  07/27/2012  . Glaucoma 07/27/2012    Past Surgical History:  Procedure Laterality Date  . ABDOMINAL HYSTERECTOMY    . CARPAL TUNNEL RELEASE Bilateral   . COLONOSCOPY WITH PROPOFOL N/A 10/20/2017   Procedure: COLONOSCOPY WITH PROPOFOL;  Surgeon: Corbin Ade, MD;  Location: [facility];  Service: Endoscopy;  Laterality: N/A;  1:45pm  . ELBOW SURGERY Right   . SHOULDER SURGERY Left      OB History    Gravida  2   Para  2   Term  2   Preterm      AB      Living  2     SAB      IAB      Ectopic      Multiple      Live Births  2           Family History  Problem Relation Age of Onset  . Hip fracture Mother   . Glaucoma Sister   . Breast cancer Sister 46  . Diabetes Sister   . Glaucoma Sister   . Glaucoma Sister   . Glaucoma Sister   . Other Brother        killed in MVA  . Glaucoma Father   . Colon cancer Neg Hx     Social History   Tobacco Use  . Smoking status: Current Every Day Smoker    Packs/day: 0.50    Years: 53.00    Pack years: 26.50    Types: Cigarettes  . Smokeless tobacco: Never Used  Vaping Use  . Vaping Use: Never used  Substance Use Topics  . Alcohol use: Yes    Alcohol/week: 5.0 standard drinks    Types: 1 Cans of beer, 4 Shots of liquor per week  . Drug use: No    Home Medications Prior to Admission medications   Medication Sig Start Date End Date Taking? Authorizing Provider  albuterol (VENTOLIN HFA) 108 (90 Base) MCG/ACT inhaler Inhale 2 puffs into the lungs every 4 (four) hours as needed for wheezing or shortness of breath. 03/25/19   Sonny Masters, FNP  amLODipine (NORVASC) 5 MG tablet TAKE ONE (1) TABLET EACH DAY 09/21/19   Jannifer Rodney A, FNP  aspirin EC 81 MG tablet Take 1 tablet (81 mg total) by mouth daily. 06/11/18   Sonny Masters, FNP  brinzolamide (AZOPT) 1 % ophthalmic suspension Place 1 drop into both eyes 2 (two) times daily.    [provider]  Cholecalciferol (VITAMIN D3) 100000 UNIT/GM POWD  Take by mouth.    [provider]  Fluticasone-Salmeterol (ADVAIR) 250-50 MCG/DOSE AEPB Inhale 1 puff into the lungs in the morning and at bedtime. 12/02/19   [name], FNP  HYDROcodone-acetaminophen (NORCO) 5-325 MG tablet Take 1 tablet by mouth 2 (two) times daily as needed for moderate pain. 12/02/19 02/01/20  Jannifer Rodney A, FNP  latanoprost (XALATAN) 0.005 % ophthalmic solution Place 1 drop into both eyes at bedtime.    [provider]  lisinopril (ZESTRIL) 40 MG tablet TAKE ONE (1) TABLET EACH DAY 09/21/19   Hawks, Neysa Bonito A, FNP  Multiple Vitamins-Minerals (MULTIVITAMIN WITH MINERALS) tablet Take 1 tablet by mouth daily.    [provider]  naloxone [facility]) nasal spray 4 mg/0.1 mL Use as needed for respiratory depression or narcotic overdose 09/15/18   Sonny Masters, FNP  omeprazole (PRILOSEC) 20 MG capsule Take 1 capsule (20 mg total) by mouth daily as needed. 11/26/19   Jannifer Rodney A, FNP  ondansetron (ZOFRAN) 4 MG tablet Take 1 tablet (4 mg total) by mouth every 8 (eight) hours as needed for nausea or vomiting. 12/16/18   Sonny Masters, FNP  pravastatin (PRAVACHOL) 40 MG tablet TAKE ONE (1) TABLET EACH DAY 11/25/19   [name], FNP    Allergies    Patient has no known allergies.  Review of Systems   Review of Systems Ten systems are reviewed and are negative for acute change except as noted in the HPI  Physical Exam Updated Vital Signs BP 92/78   Pulse 98   Temp (!) 96.8 F (36 C) (Oral)   Resp 20   Ht  (1.651 m)   Wt 45.4 kg   SpO2 100%   BMI 16.64 kg/m   Physical Exam Constitutional:      General: She is not in acute distress.    Appearance: Normal appearance. She is well-developed. She is ill-appearing. She is not diaphoretic.  HENT:     Head: Normocephalic and atraumatic.     Right Ear: External ear normal.     Left Ear: External ear normal.     Mouth/Throat:     Comments: Dried blood present corner mouth Eyes:      General: Vision grossly intact. Gaze aligned appropriately.     Pupils: Pupils are equal, round, and reactive to light.  Neck:     Trachea: Trachea and phonation normal.  Cardiovascular:     Rate and Rhythm: Normal rate.  Pulmonary:     Effort: Pulmonary effort is normal. No respiratory distress.  Breath sounds: Normal breath sounds.  Abdominal:     General: There is no distension.     Palpations: Abdomen is soft.     Tenderness: There is abdominal tenderness. There is guarding. There is no rebound.  Genitourinary:    Comments: Rectal examination chaperoned by Jimmey Ralph nurse tech. Normal rectal tone no bleeding or thrombosed external hemorrhoids. No palpable internal hemorrhoids. No gross blood on exam Musculoskeletal:        General: Normal range of motion.     Cervical back: Normal range of motion.  Skin:    General: Skin is warm and dry.  Neurological:     Mental Status: She is alert.     GCS: GCS eye subscore is 4. GCS verbal subscore is 5. GCS motor subscore is 6.     Comments: Speech is clear and goal oriented, follows commands Major Cranial nerves without deficit, no facial droop Moves extremities without ataxia, coordination intact  Psychiatric:        Behavior: Behavior normal.     ED Results / Procedures / Treatments   Labs (all labs ordered are listed, but only abnormal results are displayed) Labs Reviewed  LACTIC ACID, PLASMA - Abnormal; Notable for the following components:      Result Value   Lactic Acid, Venous 2.4 (*)    All other components within normal limits  LACTIC ACID, PLASMA - Abnormal; Notable for the following components:   Lactic Acid, Venous 3.5 (*)    All other components within normal limits  COMPREHENSIVE METABOLIC PANEL - Abnormal; Notable for the following components:   Sodium 129 (*)    Potassium 3.2 (*)    Chloride 86 (*)    CO2 20 (*)    Glucose, Bld 113 (*)    BUN 99 (*)    Creatinine, Ser 2.02 (*)    Total Bilirubin 1.4 (*)     GFR, Estimated 25 (*)    Anion gap 23 (*)    All other components within normal limits  CBC WITH DIFFERENTIAL/PLATELET - Abnormal; Notable for the following components:   Hemoglobin 15.2 (*)    MCH 34.1 (*)    Platelets 522 (*)    All other components within normal limits  URINALYSIS, ROUTINE W REFLEX MICROSCOPIC - Abnormal; Notable for the following components:   APPearance HAZY (*)    Ketones, ur 5 (*)    All other components within normal limits  CULTURE, BLOOD (ROUTINE X 2)  RESP PANEL BY RT-PCR (FLU A&B, COVID) ARPGX2  URINE CULTURE  CULTURE, BLOOD (ROUTINE X 2)  PROTIME-INR  APTT  LIPASE, BLOOD  MAGNESIUM  I-STAT CHEM 8, ED  POC OCCULT BLOOD, ED  TYPE AND SCREEN  TROPONIN I (HIGH SENSITIVITY)  TROPONIN I (HIGH SENSITIVITY)    EKG EKG Interpretation  Date/Time:  Monday February 28 2020 09:06:41 EST Ventricular Rate:  121 PR Interval:    QRS Duration: 133 QT Interval:  370 QTC Calculation: 525 R Axis:   97 Text Interpretation: Sinus tachycardia Paired ventricular premature complexes RBBB and LPFB No STEMI Confirmed by Alona Bene [phone]) on 02/28/2020 9:22:05 AM   Radiology CT ABDOMEN PELVIS WO CONTRAST  Result Date: 02/28/2020 CLINICAL DATA:  Nausea and vomiting for several days EXAM: CT ABDOMEN AND PELVIS WITHOUT CONTRAST TECHNIQUE: Multidetector CT imaging of the abdomen and pelvis was performed following the standard protocol without IV contrast. COMPARISON:  None. FINDINGS: Lower chest: No acute abnormality. Hepatobiliary: No focal liver abnormality is seen. No gallstones,  gallbladder wall thickening, or biliary dilatation. Pancreas: Unremarkable. No pancreatic ductal dilatation or surrounding inflammatory changes. Spleen: Normal in size without focal abnormality. Adrenals/Urinary Tract: Adrenal glands are within normal limits. Kidneys are well visualized bilaterally. No renal calculi or obstructive changes are seen. Bladder is decompressed. Stomach/Bowel: Colon  shows diverticular change without evidence of obstructive or inflammatory change. Colon is predominately decompressed although dense material is noted within the colon which may be related to ingested material. The appendix is not visualized and surgical clips are noted likely related to prior removal. The stomach is significantly distended with fluid as is the small bowel throughout the jejunum in into the proximal ileum. There is a transition zone identified in the mid abdomen best seen on image number 44 of series 2 and image number 41 of series 5. This is likely related to adhesions as no definitive mass is seen. The more distal small bowel is unremarkable. Vascular/Lymphatic: Aortic atherosclerosis. No enlarged abdominal or pelvic lymph nodes. Reproductive: Status post hysterectomy. No adnexal masses. Other: No abdominal wall hernia or abnormality. No abdominopelvic ascites. Musculoskeletal: Degenerative changes of lumbar spine are noted. No acute compression deformity is noted. IMPRESSION: High-grade small bowel obstruction likely related to adhesions in the proximal ileum. More distal small bowel and colon are decompressed. No other focal abnormality is noted. Electronically Signed   By: Alcide Clever M.D.   On: 02/28/2020 11:10   DG Abdomen Acute W/Chest  Result Date: 02/28/2020 CLINICAL DATA:  Abdominal pain, hypotension, vomiting EXAM: DG ABDOMEN ACUTE WITH 1 VIEW CHEST COMPARISON:  CT 07/08/2019 FINDINGS: Heart size and mediastinal contours are within normal limits. Aortic Atherosclerosis (ICD10-170.0). Lungs hyperinflated with chronic coarse attenuated bronchovascular markings. No focal infiltrate. No free air. Gas distended small bowel loops in the lower abdomen, left greater than right, only slightly increased since previous abdominal radiograph 03/20/2017. Surgical clips in the lower pelvis. The colon is decompressed. There are no abnormal calcifications. Multilevel lumbar spondylitic change. No  fracture or worrisome bone lesion. IMPRESSION: 1. Small bowel distention suggesting  ileus or obstruction. 2. No free air. Electronically Signed   By: Corlis Leak M.D.   On: 02/28/2020 09:46    Procedures .Critical Care Performed by: Bill Salinas, PA-C Authorized by: Bill Salinas, PA-C   Critical care provider statement:    Critical care time (minutes):  40   Critical care was time spent personally by me on the following activities:  Discussions with consultants, evaluation of patient's response to treatment, examination of patient, ordering and performing treatments and interventions, ordering and review of laboratory studies, ordering and review of radiographic studies, pulse oximetry, re-evaluation of patient's condition, obtaining history from patient or surrogate, review of old charts and development of treatment plan with patient or surrogate   (including critical care time)  Medications Ordered in ED Medications  octreotide (SANDOSTATIN) 2 mcg/mL load via infusion 50 mcg (50 mcg Intravenous Bolus from Bag 02/28/20 1019)    And  octreotide (SANDOSTATIN) 500 mcg in sodium chloride 0.9 % 250 mL (2 mcg/mL) infusion (50 mcg/hr Intravenous Infusion Verify 02/28/20 1059)  lactated ringers infusion ( Intravenous New Bag/Given 02/28/20 1032)  lactated ringers bolus 1,000 mL (0 mLs Intravenous Stopped 02/28/20 1017)  pantoprazole (PROTONIX) injection 40 mg (40 mg Intravenous Given 02/28/20 0939)  cefTRIAXone (ROCEPHIN) 2 g in sodium chloride 0.9 % 100 mL IVPB (0 g Intravenous Stopped 02/28/20 1017)  metroNIDAZOLE (FLAGYL) IVPB 500 mg (0 mg Intravenous Stopped 02/28/20 1057)  lactated ringers bolus  500 mL (0 mLs Intravenous Stopped 02/28/20 1057)  LORazepam (ATIVAN) injection 0.5 mg (0.5 mg Intravenous Given 02/28/20 1054)  lactated ringers bolus 500 mL (0 mLs Intravenous Stopped 02/28/20 1207)    ED Course  I have reviewed the triage vital signs and the nursing  notes.  Pertinent labs & imaging results that were available during my care of the patient were reviewed by me and considered in my medical decision making (see chart for details).  Clinical Course as of 02/28/20 1212  Mon Feb 28, 2020  9323 Jimmey Ralph NT [BM]  1125 Dr. Henreitta Leber [BM]  1135 Dr. Jerral Ralph [BM]  1137 Dr. Marletta Lor [BM]    Clinical Course User Index [BM] Elizabeth Palau   MDM Rules/Calculators/A&P                         Additional history obtained from: 1. Nursing notes from this visit. 2. Review of electronic medical records. ---- 74 year old female arrived via EMS for concern of multiple days of abdominal pain nausea vomiting with black emesis.  She drinks alcohol up to 5 days a week.  No known history of esophageal varices or GI bleed.  No fever chills chest pain cough hemoptysis melena or hematochezia.  On exam she is ill-appearing, cardiopulmonary exam unremarkable, abdomen diffusely tender with guarding.  She has what appears to be black blood stained to the corners of her lips.  No active emesis was given Zofran PTA.  She is hypotensive on arrival.  Currently does not meet SIRS criteria but due to concern of hypotension will obtain screening labs including lactic and blood cultures.  Empiric antibiotics Rocephin and Flagyl ordered along with 1 L lactated Ringer's and infusion.  Will order x-ray of the chest with acute abdomen as well as CT abdomen pelvis.  Concern for possible upper GI bleed on examination, will obtain type and screen and give octreotide and Protonix.  Case discussed with Dr. Jacqulyn Bath who has seen and evaluated patient and agrees with plan. ---- CBC without leukocytosis or anemia. Hemoccult negative High-sensitivity troponin within normal limits Lipase within normal limits Urinalysis without evidence of infection, ketones suggestive of dehydration CMP shows AKI, hyponatremia, hypochloremia, hypokalemia, LFTs nonacute, gap of 23.  Suggestive of severe  dehydration. Lactic elevated at 2.4  DG chest/abdomen:  IMPRESSION:  1. Small bowel distention suggesting ileus or obstruction.  2. No free air.   EKG: Sinus tachycardia Paired ventricular premature complexes RBBB and LPFB No STEMI Confirmed by Alona Bene [phone]) on 02/28/2020 9:22:05 AM  QTC prolonged at 525.  Magnesium added.  Will give Ativan for nausea/vomiting and avoid further Zofran - CTAP w/o Contast:   IMPRESSION:  High-grade small bowel obstruction likely related to adhesions in  the proximal ileum. More distal small bowel and colon are  decompressed.    No other focal abnormality is noted.   NG tube placed, over 1 L gastric content removed patient reports improving symptoms.  Patient reassessed states understanding of care plan is agreeable for admission.  11:25 AM: Consulted general surgery, Dr. Henreitta Leber coming to see patient.  11:35 AM: Consulted hospitalist Dr. Jerral Ralph patient accepted for admission.   11:37 AM: Consulted gastroenterology regarding possible upper GI bleed, Dr. Marletta Lor will speak with Dr. Henreitta Leber about future care  12:05 PM: Patient reassessed improving vital signs with IV fluids.  Pressure 92/78; heart rate in the 90s.  She appears fluid responsive.  Discussed case with Dr. Jacqulyn Bath and consulted Dr.  Ghimire and advised of vital signs, plan is to hold off on central line and closely monitor.    Note: Portions of this report may have been transcribed using voice recognition software. Every effort was made to ensure accuracy; however, inadvertent computerized transcription errors may still be present. Final Clinical Impression(s) / ED Diagnoses Final diagnoses:  SBO (small bowel obstruction) [facility](HCC)    Rx / DC Orders ED Discharge Orders    None       Elizabeth PalauMorelli, Yeily Link A, PA-C 02/28/20 1213    Elizabeth PalauMorelli, Gabriell Casimir A, PA-C 02/28/20 1247    Maia PlanLong, Joshua G, MD 02/29/20 [phone]

## 2020-02-28 NOTE — ED Notes (Signed)
CRITICAL VALUE ALERT  Critical Value:  Lactic acid 2.4  Date & Time Notied:  02/28/2020 0950  Provider Notified: Dr. Jacqulyn Bath  Orders Received/Actions taken: see chart

## 2020-02-28 NOTE — Consult Note (Signed)
[facility] Consult  Reason for Consult: SBO Referring Physician:  Dr. Rennis Petty  Chief Complaint    Nausea; Weakness      HPI: Samantha Huerta is a 74 y.o. female with chronic pain on a pain contract, COPD, GERD, HTN, HLD, who comes in with 5 days of abdominal pain, distention, nausea and vomiting. She also reports her last BM 5 days ago. She has not had any flatus. She comes in hypotensive to the ED with an AKI and findings consistent with dehydration.    Workup has revealed a high grade SBO with transition in the terminal ileum. She has a very large stomach and dilated bowel on CT. She has had a NG place and already 1300 output per the RN.  She says that her vomiting started out green and has become more brown in nature. She denies any prior SBO and has no cardiac history. She saw cardiology for tachycardia a few weeks ago and ECHO was normal.    She says that she had a hysterectomy in the past.   Past Medical History:  Diagnosis Date  . Anxiety   . Arthritis   . COPD (chronic obstructive pulmonary disease) (HCC)   . GERD (gastroesophageal reflux disease)   . Glaucoma 1998   Dr Carlynn Purl in [facility]  . Hyperlipidemia   . Hypertension   . Osteopenia     Past Surgical History:  Procedure Laterality Date  . ABDOMINAL HYSTERECTOMY    . CARPAL TUNNEL RELEASE Bilateral   . COLONOSCOPY WITH PROPOFOL N/A 10/20/2017   Procedure: COLONOSCOPY WITH PROPOFOL;  Surgeon: Corbin Ade, MD;  Location: [facility] SUITE;  Service: Endoscopy;  Laterality: N/A;  1:45pm  . ELBOW SURGERY Right   . SHOULDER SURGERY Left     Family History  Problem Relation Age of Onset  . Hip fracture Mother   . Glaucoma Sister   . Breast cancer Sister 67  . Diabetes Sister   . Glaucoma Sister   . Glaucoma Sister   . Glaucoma Sister   . Other Brother        killed in MVA  . Glaucoma Father   . Colon cancer Neg Hx     Social History   Tobacco Use  . Smoking status: Current Every Day  Smoker    Packs/day: 0.50    Years: 53.00    Pack years: 26.50    Types: Cigarettes  . Smokeless tobacco: Never Used  Vaping Use  . Vaping Use: Never used  Substance Use Topics  . Alcohol use: Yes    Alcohol/week: 5.0 standard drinks    Types: 1 Cans of beer, 4 Shots of liquor per week  . Drug use: No    Medications: I have reviewed the patient's current medications. Current Facility-Administered Medications  Medication Dose Route Frequency Provider Last Rate Last Admin  . lactated ringers infusion   Intravenous Continuous Harlene Salts A, PA-C 150 mL/hr at 02/28/20 1032 New Bag at 02/28/20 1032  . octreotide (SANDOSTATIN) 500 mcg in sodium chloride 0.9 % 250 mL (2 mcg/mL) infusion  50 mcg/hr Intravenous Continuous Harlene Salts A, PA-C 25 mL/hr at 02/28/20 1059 50 mcg/hr at 02/28/20 1059   Current Outpatient Medications  Medication Sig Dispense Refill Last Dose  . albuterol (VENTOLIN HFA) 108 (90 Base) MCG/ACT inhaler Inhale 2 puffs into the lungs every 4 (four) hours as needed for wheezing or shortness of breath. 8.5 g 11   . amLODipine (NORVASC) 5 MG tablet  TAKE ONE (1) TABLET EACH DAY 90 tablet 1   . aspirin EC 81 MG tablet Take 1 tablet (81 mg total) by mouth daily. 30 tablet 3   . brinzolamide (AZOPT) 1 % ophthalmic suspension Place 1 drop into both eyes 2 (two) times daily.     . Cholecalciferol (VITAMIN D3) 100000 UNIT/GM POWD Take by mouth.     . Fluticasone-Salmeterol (ADVAIR) 250-50 MCG/DOSE AEPB Inhale 1 puff into the lungs in the morning and at bedtime. 60 each 5   . HYDROcodone-acetaminophen (NORCO) 5-325 MG tablet Take 1 tablet by mouth 2 (two) times daily as needed for moderate pain. 45 tablet 0   . latanoprost (XALATAN) 0.005 % ophthalmic solution Place 1 drop into both eyes at bedtime.     Marland Kitchen lisinopril (ZESTRIL) 40 MG tablet TAKE ONE (1) TABLET EACH DAY 90 tablet 1   . Multiple Vitamins-Minerals (MULTIVITAMIN WITH MINERALS) tablet Take 1 tablet by mouth daily.      . naloxone (NARCAN) nasal spray 4 mg/0.1 mL Use as needed for respiratory depression or narcotic overdose 1 each 3   . omeprazole (PRILOSEC) 20 MG capsule Take 1 capsule (20 mg total) by mouth daily as needed. 90 capsule 0   . ondansetron (ZOFRAN) 4 MG tablet Take 1 tablet (4 mg total) by mouth every 8 (eight) hours as needed for nausea or vomiting. 20 tablet 0   . pravastatin (PRAVACHOL) 40 MG tablet TAKE ONE (1) TABLET EACH DAY 90 tablet 0     No Known Allergies   ROS:  A comprehensive review of systems was negative except for: Gastrointestinal: positive for abdominal pain, constipation, nausea and vomiting  Blood pressure (!) 87/53, pulse 99, temperature (!) 96.8 F (36 C), temperature source Oral, resp. rate (!) 25, height  (1.651 m), weight 45.4 kg, SpO2 100 %. Physical Exam Vitals reviewed.  Constitutional:      Appearance: She is ill-appearing.  HENT:     Head: Normocephalic.     Nose: Nose normal.     Mouth/Throat:     Mouth: Mucous membranes are dry.  Eyes:     Extraocular Movements: Extraocular movements intact.  Cardiovascular:     Rate and Rhythm: Tachycardia present.  Pulmonary:     Effort: Pulmonary effort is normal.  Abdominal:     General: There is distension.     Palpations: Abdomen is soft.     Tenderness: There is abdominal tenderness.     Comments: Mildly tender, midline lower incision  Musculoskeletal:        General: No swelling.     Cervical back: No rigidity.  Skin:    General: Skin is warm.  Neurological:     General: No focal deficit present.     Mental Status: She is alert and oriented to person, place, and time.  Psychiatric:        Mood and Affect: Mood normal.        Behavior: Behavior normal.        Thought Content: Thought content normal.        Judgment: Judgment normal.     Results: Results for orders placed or performed during the hospital encounter of 02/28/20 (from the past 48 hour(s))  Lactic acid, plasma     Status:  Abnormal   Collection Time: 02/28/20  9:13 AM  Result Value Ref Range   Lactic Acid, Venous 2.4 (HH) 0.5 - 1.9 mmol/L    Comment: CRITICAL RESULT CALLED TO, READ BACK BY  AND VERIFIED WITH: DOSS,M AT 9:50AM ON 02/28/20 BY [facility] Performed at [facility], [address], [place], [place] [address]   Comprehensive metabolic panel     Status: Abnormal   Collection Time: 02/28/20  9:13 AM  Result Value Ref Range   Sodium 129 (L) 135 - 145 mmol/L    Comment: RESULT REPEATED AND VERIFIED   Potassium 3.2 (L) 3.5 - 5.1 mmol/L   Chloride 86 (L) 98 - 111 mmol/L   CO2 20 (L) 22 - 32 mmol/L   Glucose, Bld 113 (H) 70 - 99 mg/dL    Comment: Glucose reference range applies only to samples taken after fasting for at least 8 hours.   BUN 99 (H) 8 - 23 mg/dL   Creatinine, Ser 6.04 (H) 0.44 - 1.00 mg/dL   Calcium 9.6 8.9 - 54.0 mg/dL   Total Protein 6.9 6.5 - 8.1 g/dL   Albumin 4.1 3.5 - 5.0 g/dL   AST 22 15 - 41 U/L   ALT 17 0 - 44 U/L   Alkaline Phosphatase 51 38 - 126 U/L   Total Bilirubin 1.4 (H) 0.3 - 1.2 mg/dL   GFR, Estimated 25 (L) >60 mL/min    Comment: (NOTE) Calculated using the CKD-EPI Creatinine Equation (2021)    Anion gap 23 (H) 5 - 15    Comment: Performed at [facility], [address], [place], [place] [address]  CBC WITH DIFFERENTIAL     Status: Abnormal   Collection Time: 02/28/20  9:13 AM  Result Value Ref Range   WBC 4.4 4.0 - 10.5 K/uL   RBC 4.46 3.87 - 5.11 MIL/uL   Hemoglobin 15.2 (H) 12.0 - 15.0 g/dL   HCT 14.7 82.9 - 56.2 %   MCV 100.0 80.0 - 100.0 fL   MCH 34.1 (H) 26.0 - 34.0 pg   MCHC 34.1 30.0 - 36.0 g/dL   RDW 13.0 86.5 - 78.4 %   Platelets 522 (H) 150 - 400 K/uL   nRBC 0.0 0.0 - 0.2 %   Neutrophils Relative % 68 %   Neutro Abs 3.0 1.7 - 7.7 K/uL   Lymphocytes Relative 19 %   Lymphs Abs 0.8 0.7 - 4.0 K/uL   Monocytes Relative 13 %   Monocytes Absolute 0.6 0.1 - 1.0 K/uL   Eosinophils Relative 0 %   Eosinophils Absolute 0.0 0.0 - 0.5 K/uL    Basophils Relative 0 %   Basophils Absolute 0.0 0.0 - 0.1 K/uL   Abs Immature Granulocytes 0.03 0.00 - 0.07 K/uL    Comment: Performed at [facility], [address], [place], [place] [address]  Protime-INR     Status: None   Collection Time: 02/28/20  9:13 AM  Result Value Ref Range   Prothrombin Time 12.2 11.4 - 15.2 seconds   INR 0.9 0.8 - 1.2    Comment: (NOTE) INR goal varies based on device and disease states. Performed at [facility], [address] [place], [place] [address]   APTT     Status: None   Collection Time: 02/28/20  9:13 AM  Result Value Ref Range   aPTT 25 24 - 36 seconds    Comment: Performed at [facility], [address], [place], [place] [address]  Urinalysis, Routine w reflex microscopic Urine, Catheterized     Status: Abnormal   Collection Time: 02/28/20  9:13 AM  Result Value Ref Range   Color, Urine YELLOW YELLOW   APPearance HAZY (A) CLEAR   Specific Gravity,  Urine 1.018 1.005 - 1.030   pH 5.0 5.0 - 8.0   Glucose, UA NEGATIVE NEGATIVE mg/dL   Hgb urine dipstick NEGATIVE NEGATIVE   Bilirubin Urine NEGATIVE NEGATIVE   Ketones, ur 5 (A) NEGATIVE mg/dL   Protein, ur NEGATIVE NEGATIVE mg/dL   Nitrite NEGATIVE NEGATIVE   Leukocytes,Ua NEGATIVE NEGATIVE    Comment: Performed at [facility], [address] [place], [place] [address]  Troponin I (High Sensitivity)     Status: None   Collection Time: 02/28/20  9:13 AM  Result Value Ref Range   Troponin I (High Sensitivity) 9 <18 ng/L    Comment: (NOTE) Elevated high sensitivity troponin I (hsTnI) values and significant  changes across serial measurements may suggest ACS but many other  chronic and acute conditions are known to elevate hsTnI results.  Refer to the Links section for chest pain algorithms and additional  guidance. Performed at [facility], [address] [place], [place] [address]   Lipase, blood     Status: None   Collection Time: 02/28/20  9:13 AM  Result Value Ref Range   Lipase  36 11 - 51 U/L    Comment: Performed at [facility], [address] [place], [place] [address]  Blood Culture (routine x 2)     Status: None (Preliminary result)   Collection Time: 02/28/20  9:14 AM   Specimen: Blood  Result Value Ref Range   Specimen Description BLOOD RIGHT ANTECUBITAL    Special Requests      BOTTLES DRAWN AEROBIC AND ANAEROBIC Blood Culture results may not be optimal due to an inadequate volume of blood received in culture bottles Performed at [facility], [address] [place], [place] [address]    Culture PENDING    Report Status PENDING   Resp Panel by RT-PCR (Flu A&B, Covid) Nasopharyngeal Swab     Status: None   Collection Time: 02/28/20  9:20 AM   Specimen: Nasopharyngeal Swab; Nasopharyngeal(NP) swabs in vial transport medium  Result Value Ref Range   SARS Coronavirus 2 by RT PCR NEGATIVE NEGATIVE    Comment: (NOTE) SARS-CoV-2 target nucleic acids are NOT DETECTED.  The SARS-CoV-2 RNA is generally detectable in upper respiratory specimens during the acute phase of infection. The lowest concentration of SARS-CoV-2 viral copies this assay can detect is 138 copies/mL. A negative result does not preclude SARS-Cov-2 infection and should not be used as the sole basis for treatment or other patient management decisions. A negative result may occur with  improper specimen collection/handling, submission of specimen other than nasopharyngeal swab, presence of viral mutation(s) within the areas targeted by this assay, and inadequate number of viral copies(<138 copies/mL). A negative result must be combined with clinical observations, patient history, and epidemiological information. The expected result is Negative.  Fact Sheet for Patients:  BloggerCourse.comhttps://www.fda.gov/media/152166/download  Fact Sheet for Healthcare Providers:  SeriousBroker.ithttps://www.fda.gov/media/152162/download  This test is no t yet approved or cleared by the [place] FDA and  has been authorized  for detection and/or diagnosis of SARS-CoV-2 by FDA under an Emergency Use Authorization (EUA). This EUA will remain  in effect (meaning this test can be used) for the duration of the COVID-19 declaration under Section 564(b)(1) of the Act, 21 U.S.C.section 360bbb-3(b)(1), unless the authorization is terminated  or revoked sooner.       Influenza A by PCR NEGATIVE NEGATIVE   Influenza B by PCR NEGATIVE NEGATIVE    Comment: (NOTE) The Xpert Xpress SARS-CoV-2/FLU/RSV plus assay is intended as  an aid in the diagnosis of influenza from Nasopharyngeal swab specimens and should not be used as a sole basis for treatment. Nasal washings and aspirates are unacceptable for Xpert Xpress SARS-CoV-2/FLU/RSV testing.  Fact Sheet for Patients: BloggerCourse.com  Fact Sheet for Healthcare Providers: SeriousBroker.it  This test is not yet approved or cleared by the [place] FDA and has been authorized for detection and/or diagnosis of SARS-CoV-2 by FDA under an Emergency Use Authorization (EUA). This EUA will remain in effect (meaning this test can be used) for the duration of the COVID-19 declaration under Section 564(b)(1) of the Act, 21 U.S.C. section 360bbb-3(b)(1), unless the authorization is terminated or revoked.  Performed at [facility], [address], [place], [place] [address]   POC occult blood, ED     Status: None   Collection Time: 02/28/20  9:27 AM  Result Value Ref Range   Fecal Occult Bld NEGATIVE NEGATIVE  Type and screen [facility]     Status: None   Collection Time: 02/28/20  9:27 AM  Result Value Ref Range   ABO/RH(D) A POS    Antibody Screen NEG    Sample Expiration      03/02/2020,2359 Performed at [facility], [address], [place], [place] [address]   Magnesium     Status: None   Collection Time: 02/28/20  9:57 AM  Result Value Ref Range   Magnesium 2.1 1.7 - 2.4 mg/dL    Comment: Performed at  [facility], [address] [place], [place] [address]   Personally reviewed- patient with large stomach and dilated small bowel, consistent with obstruction, transition in the mid abdomen in the ileum, no free fluid or free air  CT ABDOMEN PELVIS WO CONTRAST  Result Date: 02/28/2020 CLINICAL DATA:  Nausea and vomiting for several days EXAM: CT ABDOMEN AND PELVIS WITHOUT CONTRAST TECHNIQUE: Multidetector CT imaging of the abdomen and pelvis was performed following the standard protocol without IV contrast. COMPARISON:  None. FINDINGS: Lower chest: No acute abnormality. Hepatobiliary: No focal liver abnormality is seen. No gallstones, gallbladder wall thickening, or biliary dilatation. Pancreas: Unremarkable. No pancreatic ductal dilatation or surrounding inflammatory changes. Spleen: Normal in size without focal abnormality. Adrenals/Urinary Tract: Adrenal glands are within normal limits. Kidneys are well visualized bilaterally. No renal calculi or obstructive changes are seen. Bladder is decompressed. Stomach/Bowel: Colon shows diverticular change without evidence of obstructive or inflammatory change. Colon is predominately decompressed although dense material is noted within the colon which may be related to ingested material. The appendix is not visualized and surgical clips are noted likely related to prior removal. The stomach is significantly distended with fluid as is the small bowel throughout the jejunum in into the proximal ileum. There is a transition zone identified in the mid abdomen best seen on image number 44 of series 2 and image number 41 of series 5. This is likely related to adhesions as no definitive mass is seen. The more distal small bowel is unremarkable. Vascular/Lymphatic: Aortic atherosclerosis. No enlarged abdominal or pelvic lymph nodes. Reproductive: Status post hysterectomy. No adnexal masses. Other: No abdominal wall hernia or abnormality. No abdominopelvic ascites.  Musculoskeletal: Degenerative changes of lumbar spine are noted. No acute compression deformity is noted. IMPRESSION: High-grade small bowel obstruction likely related to adhesions in the proximal ileum. More distal small bowel and colon are decompressed. No other focal abnormality is noted. Electronically Signed   By: Alcide Clever M.D.   On: 02/28/2020 11:10   DG Abdomen Acute  W/Chest  Result Date: 02/28/2020 CLINICAL DATA:  Abdominal pain, hypotension, vomiting EXAM: DG ABDOMEN ACUTE WITH 1 VIEW CHEST COMPARISON:  CT 07/08/2019 FINDINGS: Heart size and mediastinal contours are within normal limits. Aortic Atherosclerosis (ICD10-170.0). Lungs hyperinflated with chronic coarse attenuated bronchovascular markings. No focal infiltrate. No free air. Gas distended small bowel loops in the lower abdomen, left greater than right, only slightly increased since previous abdominal radiograph 03/20/2017. Surgical clips in the lower pelvis. The colon is decompressed. There are no abnormal calcifications. Multilevel lumbar spondylitic change. No fracture or worrisome bone lesion. IMPRESSION: 1. Small bowel distention suggesting  ileus or obstruction. 2. No free air. Electronically Signed   By: Corlis Leak M.D.   On: 02/28/2020 09:46     Assessment & Plan:  JANYAH SINGLETERRY is a 74 y.o. female with a SBO and dehydration, AKI. I think her hypotension and tachycardia is related to dehydration. She is tender minimally and soft. Lactic is mildly elevated but needs resuscitation. I do not think she needs emergent surgical intervention. I think her BP and tachycardia will improve with resuscitation.  No signs of infection or thickening in the bowel.   Discussed with patient etiology of SBO and unpredictable nature of recurrence. Discussed plan for non operative management and possibility of needing operation if does not resolve.   -NPO and NG in place, will hold on SBFT today given the extent of the output and her stomach  distention, possibly tomorrow - Replace lytes- K  - Fluid resuscitation - Some concern in ED for GIB, H&H is high due to concentration, I think any bloody emesis likely from retching and dark output from obstruction, discussed with Dr. Marletta Lor and I think can hold on GI consult for now    All questions were answered to the satisfaction of the patient.  COVID negative.     Lucretia Roers 02/28/2020, 11:24 AM

## 2020-02-28 NOTE — Progress Notes (Addendum)
BP had improved in the ED post IVF resuscitation Now again dropping after she got to the ICU Looks essentially unchanged-remains alert/awake-relatively asymptomatic  Plan Place PICC 0.9 NS 250 cc bolus Start Levophed Recheck CBC Check Random cortisol Type and screen  Addendum: PICC line team-not available-Dr Henreitta Leber placing central line now Awaiting CBC/Random cortisol Will start empiric Zosyn/Vanco till cultures come back. May need CCM consult if no improvement-watch closely

## 2020-02-28 NOTE — Procedures (Signed)
Procedure Note  02/28/20    Preoperative Diagnosis: Hypotension from hypovolemia, dehydration, acute kidney injury    Postoperative Diagnosis: Same   Procedure(s) Performed: Central Line placement, Right internal jugular    Surgeon: Leatrice Jewels. Henreitta Leber, MD   Assistants: None   Anesthesia: 1% lidocaine    Complications: None    Indications: Ms. Samantha Huerta is a 74 y.o. with SBO and severe dehydration with hypotension. She has received several liters of fluid and has hypotension still. She is otherwise with improved abdominal pain and distention. Her abdomen is much softer now and without rebound or guarding. I discussed the risk and benefits of placement of the central line with her, including but not limited to bleeding, infection, and risk of pneumothorax. She has given consent for the procedure.    Procedure: The patient placed supine. The right chest and neck was prepped and draped in the usual sterile fashion.  Wearing full gown and gloves, I performed the procedure.  One percent lidocaine was used for local anesthesia. An ultrasound was utilized to assess the jugular vein.  The needle with syringe was advanced into the jugular vein under US guidance with dark venous return, and a wire was placed using the Seldinger technique without difficulty.  Ectopia was not noted.  The skin was knicked and a dilator was placed, and the three lumen catheter was placed over the wire with continued control of the wire.  There was good draw back of blood from all three lumens and each flushed easily with saline.  The catheter was secured in 4 points with 2-0 silk and a biopatch and dressing was placed.     The patient tolerated the procedure well, and the CXR was ordered to confirm position of the central line.   Algis Greenhouse, MD [facility] [address] [place], [place] [address] [phone] (office)

## 2020-02-28 NOTE — ED Notes (Signed)
Pt vomiitng dark green brown vomit, smells of bowel.

## 2020-02-28 NOTE — ED Notes (Signed)
Report called to [facility]

## 2020-02-28 NOTE — ED Triage Notes (Signed)
Pt states she's been vomiting since Wednesday.  Reports to be vomiting black.  Pt pale, and cool.  Pt given 4mg  zofran iv and NS en route

## 2020-02-28 NOTE — Progress Notes (Signed)
PICC order received but patient is stable at this time therefore PICC not able to be placed.  Spoke with Brayton El, RN regarding this.  She states that surgeon is coming to place CVL, PICC order to be canceled.

## 2020-02-28 NOTE — H&P (Addendum)
HISTORY AND PHYSICAL       PATIENT DETAILS Name: Samantha Huerta Age: 74 y.o. Sex: female Date of Birth: [birth year] Admit Date: 02/28/2020 ZOX:WRUEAPCP:Hawks, Edilia Bohristy A, FNP   Patient coming from: Home   CHIEF COMPLAINT:  Nausea/vomiting/abdominal pain x5 days  HPI: Samantha Huerta is a 74 y.o. female with medical history significant of glaucoma, HTN, COPD, chronic back pain-presented to the ED with nausea/vomiting/abdominal pain x5 days.  Per patient-she has had intractable nausea and vomiting for the past few days-vomiting is mostly bilious and at times dark.  She has not noted any blood in the vomitus.  She complains of colicky abdominal pain-10/10 as well.  Her last bowel movement was approximately 4-5 days back, her last flatus was approximately 3 days back.  Upon presentation to the emergency room-she was noted to be hypotensive-labs showed AKI-CT imaging showed high-grade SBO.  Hospitalist service was asked to admit this patient for further evaluation and treatment.  Patient denies any fever, headache, chest pain or shortness of breath.  ED Course: Hypotensive-received 3.5 L bolus IV fluid-labs showed AKI-General surgery consulted.  Note: Lives at: Home Mobility: Independent Chronic Indwelling Foley:no   REVIEW OF SYSTEMS:  Constitutional:   No  weight loss, night sweats,  Fevers, chills, fatigue.  HEENT:    No headaches, Dysphagia,Tooth/dental problems,Sore throat,  No sneezing, itching, ear ache, nasal congestion, post nasal drip  Cardio-vascular: No chest pain,Orthopnea, PND,lower extremity edema, anasarca, palpitations  GI:  No heartburn, indigestion  Resp: No shortness of breath, cough, hemoptysis,plueritic chest pain.   Skin:  No rash or lesions.  GU:  No dysuria, change in color of urine, no urgency or frequency.  No flank pain.  Musculoskeletal: No joint pain or swelling.  No decreased range of motion.  No back pain.  Endocrine: No heat  intolerance, no cold intolerance, no polyuria, no polydipsia  Psych: No change in mood or affect. No depression or anxiety.  No memory loss.   ALLERGIES:  No Known Allergies  PAST MEDICAL HISTORY: Past Medical History:  Diagnosis Date  . Anxiety   . Arthritis   . COPD (chronic obstructive pulmonary disease) (HCC)   . GERD (gastroesophageal reflux disease)   . Glaucoma 1998   [facility]  . Hyperlipidemia   . Hypertension   . Osteopenia     PAST SURGICAL HISTORY: Past Surgical History:  Procedure Laterality Date  . ABDOMINAL HYSTERECTOMY    . CARPAL TUNNEL RELEASE Bilateral   . COLONOSCOPY WITH PROPOFOL N/A 10/20/2017   Procedure: COLONOSCOPY WITH PROPOFOL;  Surgeon: Corbin Adeourk, Robert M, MD;  Location: [facility];  Service: Endoscopy;  Laterality: N/A;  1:45pm  . ELBOW SURGERY Right   . SHOULDER SURGERY Left     MEDICATIONS AT HOME: Prior to Admission medications   Medication Sig Start Date End Date Taking? Authorizing Provider  albuterol (VENTOLIN HFA) 108 (90 Base) MCG/ACT inhaler Inhale 2 puffs into the lungs every 4 (four) hours as needed for wheezing or shortness of breath. 03/25/19   Sonny Mastersakes, Linda M, FNP  amLODipine (NORVASC) 5 MG tablet TAKE ONE (1) TABLET EACH DAY 09/21/19   Jannifer RodneyHawks, Christy A, FNP  aspirin EC 81 MG tablet Take 1 tablet (81 mg total) by mouth daily. 06/11/18   Sonny Mastersakes, Linda M, FNP  brinzolamide (AZOPT) 1 % ophthalmic suspension Place 1 drop into both eyes 2 (two) times daily.    [provider]  Cholecalciferol (VITAMIN  D3) 100000 UNIT/GM POWD Take by mouth.    [provider]  Fluticasone-Salmeterol (ADVAIR) 250-50 MCG/DOSE AEPB Inhale 1 puff into the lungs in the morning and at bedtime. 12/02/19   [name], FNP  HYDROcodone-acetaminophen (NORCO) 5-325 MG tablet Take 1 tablet by mouth 2 (two) times daily as needed for moderate pain. 12/02/19 02/01/20  Jannifer Rodney A, FNP  latanoprost (XALATAN) 0.005 % ophthalmic solution  Place 1 drop into both eyes at bedtime.    [provider]  lisinopril (ZESTRIL) 40 MG tablet TAKE ONE (1) TABLET EACH DAY 09/21/19   Hawks, Neysa Bonito A, FNP  Multiple Vitamins-Minerals (MULTIVITAMIN WITH MINERALS) tablet Take 1 tablet by mouth daily.    [provider]  naloxone [facility]) nasal spray 4 mg/0.1 mL Use as needed for respiratory depression or narcotic overdose 09/15/18   Sonny Masters, FNP  omeprazole (PRILOSEC) 20 MG capsule Take 1 capsule (20 mg total) by mouth daily as needed. 11/26/19   Jannifer Rodney A, FNP  ondansetron (ZOFRAN) 4 MG tablet Take 1 tablet (4 mg total) by mouth every 8 (eight) hours as needed for nausea or vomiting. 12/16/18   Sonny Masters, FNP  pravastatin (PRAVACHOL) 40 MG tablet TAKE ONE (1) TABLET EACH DAY 11/25/19   [name], FNP    FAMILY HISTORY: Family History  Problem Relation Age of Onset  . Hip fracture Mother   . Glaucoma Sister   . Breast cancer Sister 30  . Diabetes Sister   . Glaucoma Sister   . Glaucoma Sister   . Glaucoma Sister   . Other Brother        killed in MVA  . Glaucoma Father   . Colon cancer Neg Hx       SOCIAL HISTORY:  reports that she has been smoking cigarettes. She has a 26.50 pack-year smoking history. She has never used smokeless tobacco. She reports current alcohol use of about 5.0 standard drinks of alcohol per week. She reports that she does not use drugs.  PHYSICAL EXAM: Blood pressure 92/78, pulse 98, temperature (!) 96.8 F (36 C), temperature source Oral, resp. rate 20, height  (1.651 m), weight 45.4 kg, SpO2 100 %.  General appearance :Awake, alert, chronically sick appearing. Eyes:, pupils equally reactive to light and accomodation,no scleral icterus.Pink conjunctiva HEENT: Atraumatic and Normocephalic Neck: supple, no JVD.  Resp:Good air entry bilaterally, no added sounds  CVS: S1 S2 regular, no murmurs.  GI: Bowel sounds sluggish-slightly distended-not tender-no peritoneal  signs. Extremities: B/L Lower Ext shows no edema, both legs are warm to touch Neurology:  speech clear,Non focal, sensation is grossly intact. Psychiatric: Normal judgment and insight. Alert and oriented x 3. Normal mood. Skin:No Rash, warm and dry Wounds:N/A  LABS ON ADMISSION:  I have personally reviewed following labs and imaging studies  CBC: Recent Labs  Lab 02/28/20 0913  WBC 4.4  NEUTROABS 3.0  HGB 15.2*  HCT 44.6  MCV 100.0  PLT 522*    Basic Metabolic Panel: Recent Labs  Lab 02/28/20 0913 02/28/20 0957  NA 129*  --   K 3.2*  --   CL 86*  --   CO2 20*  --   GLUCOSE 113*  --   BUN 99*  --   CREATININE 2.02*  --   CALCIUM 9.6  --   MG  --  2.1    GFR: Estimated Creatinine Clearance: 17.2 mL/min (A) (by C-G formula based on SCr of 2.02 mg/dL (H)).  Liver Function Tests: Recent Labs  Lab 02/28/20 0913  AST 22  ALT 17  ALKPHOS 51  BILITOT 1.4*  PROT 6.9  ALBUMIN 4.1   Recent Labs  Lab 02/28/20 0913  LIPASE 36   No results for input(s): AMMONIA in the last 168 hours.  Coagulation Profile: Recent Labs  Lab 02/28/20 0913  INR 0.9    Cardiac Enzymes: No results for input(s): CKTOTAL, CKMB, CKMBINDEX, TROPONINI in the last 168 hours.  BNP (last 3 results) No results for input(s): PROBNP in the last 8760 hours.  HbA1C: No results for input(s): HGBA1C in the last 72 hours.  CBG: No results for input(s): GLUCAP in the last 168 hours.  Lipid Profile: No results for input(s): CHOL, HDL, LDLCALC, TRIG, CHOLHDL, LDLDIRECT in the last 72 hours.  Thyroid Function Tests: No results for input(s): TSH, T4TOTAL, FREET4, T3FREE, THYROIDAB in the last 72 hours.  Anemia Panel: No results for input(s): VITAMINB12, FOLATE, FERRITIN, TIBC, IRON, RETICCTPCT in the last 72 hours.  Urine analysis:    Component Value Date/Time   COLORURINE YELLOW 02/28/2020 0913   APPEARANCEUR HAZY (A) 02/28/2020 0913   APPEARANCEUR Clear 03/20/2017 1112   LABSPEC  1.018 02/28/2020 0913   PHURINE 5.0 02/28/2020 0913   GLUCOSEU NEGATIVE 02/28/2020 0913   HGBUR NEGATIVE 02/28/2020 0913   BILIRUBINUR NEGATIVE 02/28/2020 0913   BILIRUBINUR Negative 03/20/2017 1112   KETONESUR 5 (A) 02/28/2020 0913   PROTEINUR NEGATIVE 02/28/2020 0913   UROBILINOGEN negative 01/25/2014 1529   NITRITE NEGATIVE 02/28/2020 0913   LEUKOCYTESUR NEGATIVE 02/28/2020 0913    Sepsis Labs: Lactic Acid, Venous    Component Value Date/Time   LATICACIDVEN 3.5 [facility]) 02/28/2020 1145     Microbiology: Recent Results (from the past 240 hour(s))  Blood Culture (routine x 2)     Status: None (Preliminary result)   Collection Time: 02/28/20  9:14 AM   Specimen: Blood  Result Value Ref Range Status   Specimen Description BLOOD RIGHT ANTECUBITAL  Final   Special Requests   Final    BOTTLES DRAWN AEROBIC AND ANAEROBIC Blood Culture results may not be optimal due to an inadequate volume of blood received in culture bottles Performed at [facility], [address], [place], [place] [address]    Culture PENDING  Incomplete   Report Status PENDING  Incomplete  Resp Panel by RT-PCR (Flu A&B, Covid) Nasopharyngeal Swab     Status: None   Collection Time: 02/28/20  9:20 AM   Specimen: Nasopharyngeal Swab; Nasopharyngeal(NP) swabs in vial transport medium  Result Value Ref Range Status   SARS Coronavirus 2 by RT PCR NEGATIVE NEGATIVE Final    Comment: (NOTE) SARS-CoV-2 target nucleic acids are NOT DETECTED.  The SARS-CoV-2 RNA is generally detectable in upper respiratory specimens during the acute phase of infection. The lowest concentration of SARS-CoV-2 viral copies this assay can detect is 138 copies/mL. A negative result does not preclude SARS-Cov-2 infection and should not be used as the sole basis for treatment or other patient management decisions. A negative result may occur with  improper specimen collection/handling, submission of specimen other than nasopharyngeal swab,  presence of viral mutation(s) within the areas targeted by this assay, and inadequate number of viral copies(<138 copies/mL). A negative result must be combined with clinical observations, patient history, and epidemiological information. The expected result is Negative.  Fact Sheet for Patients:  BloggerCourse.com  Fact Sheet for Healthcare Providers:  SeriousBroker.it  This test is no t yet approved or cleared  by the [place] and  has been authorized for detection and/or diagnosis of SARS-CoV-2 by FDA under an Emergency Use Authorization (EUA). This EUA will remain  in effect (meaning this test can be used) for the duration of the COVID-19 declaration under Section 564(b)(1) of the Act, 21 U.S.C.section 360bbb-3(b)(1), unless the authorization is terminated  or revoked sooner.       Influenza A by PCR NEGATIVE NEGATIVE Final   Influenza B by PCR NEGATIVE NEGATIVE Final    Comment: (NOTE) The Xpert Xpress SARS-CoV-2/FLU/RSV plus assay is intended as an aid in the diagnosis of influenza from Nasopharyngeal swab specimens and should not be used as a sole basis for treatment. Nasal washings and aspirates are unacceptable for Xpert Xpress SARS-CoV-2/FLU/RSV testing.  Fact Sheet for Patients: BloggerCourse.com  Fact Sheet for Healthcare Providers: SeriousBroker.it  This test is not yet approved or cleared by the [place] FDA and has been authorized for detection and/or diagnosis of SARS-CoV-2 by FDA under an Emergency Use Authorization (EUA). This EUA will remain in effect (meaning this test can be used) for the duration of the COVID-19 declaration under Section 564(b)(1) of the Act, 21 U.S.C. section 360bbb-3(b)(1), unless the authorization is terminated or revoked.  Performed at [facility], [address], [place], [place] [address]       RADIOLOGIC STUDIES  ON ADMISSION: CT ABDOMEN PELVIS WO CONTRAST  Result Date: 02/28/2020 CLINICAL DATA:  Nausea and vomiting for several days EXAM: CT ABDOMEN AND PELVIS WITHOUT CONTRAST TECHNIQUE: Multidetector CT imaging of the abdomen and pelvis was performed following the standard protocol without IV contrast. COMPARISON:  None. FINDINGS: Lower chest: No acute abnormality. Hepatobiliary: No focal liver abnormality is seen. No gallstones, gallbladder wall thickening, or biliary dilatation. Pancreas: Unremarkable. No pancreatic ductal dilatation or surrounding inflammatory changes. Spleen: Normal in size without focal abnormality. Adrenals/Urinary Tract: Adrenal glands are within normal limits. Kidneys are well visualized bilaterally. No renal calculi or obstructive changes are seen. Bladder is decompressed. Stomach/Bowel: Colon shows diverticular change without evidence of obstructive or inflammatory change. Colon is predominately decompressed although dense material is noted within the colon which may be related to ingested material. The appendix is not visualized and surgical clips are noted likely related to prior removal. The stomach is significantly distended with fluid as is the small bowel throughout the jejunum in into the proximal ileum. There is a transition zone identified in the mid abdomen best seen on image number 44 of series 2 and image number 41 of series 5. This is likely related to adhesions as no definitive mass is seen. The more distal small bowel is unremarkable. Vascular/Lymphatic: Aortic atherosclerosis. No enlarged abdominal or pelvic lymph nodes. Reproductive: Status post hysterectomy. No adnexal masses. Other: No abdominal wall hernia or abnormality. No abdominopelvic ascites. Musculoskeletal: Degenerative changes of lumbar spine are noted. No acute compression deformity is noted. IMPRESSION: High-grade small bowel obstruction likely related to adhesions in the proximal ileum. More distal small bowel  and colon are decompressed. No other focal abnormality is noted. Electronically Signed   By: Alcide Clever M.D.   On: 02/28/2020 11:10   DG Abdomen Acute W/Chest  Result Date: 02/28/2020 CLINICAL DATA:  Abdominal pain, hypotension, vomiting EXAM: DG ABDOMEN ACUTE WITH 1 VIEW CHEST COMPARISON:  CT 07/08/2019 FINDINGS: Heart size and mediastinal contours are within normal limits. Aortic Atherosclerosis (ICD10-170.0). Lungs hyperinflated with chronic coarse attenuated bronchovascular markings. No focal infiltrate. No free air. Gas distended small bowel loops in the  lower abdomen, left greater than right, only slightly increased since previous abdominal radiograph 03/20/2017. Surgical clips in the lower pelvis. The colon is decompressed. There are no abnormal calcifications. Multilevel lumbar spondylitic change. No fracture or worrisome bone lesion. IMPRESSION: 1. Small bowel distention suggesting  ileus or obstruction. 2. No free air. Electronically Signed   By: Corlis Leak M.D.   On: 02/28/2020 09:46    I have personally reviewed images of chest xray-no pneumonia  EKG:  Personally reviewed.  Sinus tachycardia  ASSESSMENT AND PLAN: Hypovolemic shock: Secondary to GI loss due to vomiting-plan is to continue IV fluid resuscitation-discussed with ED-PA-C-if hypotension persists-they will place central line for pressor support.  Will admit to the ICU for close monitoring.  AKI: Hemodynamically mediated in the setting of hypotension/vomiting and ACE inhibitor use.  Hopeful that with improvement in blood pressure and IV fluid resuscitation-renal function will improve over the next few days.  Avoid nephrotoxic agents.  Hypokalemia: Secondary to GI loss-replete and recheck.  High-grade small bowel obstruction: NG tube in place-keep n.p.o.-General surgery following with plans for supportive care-await further recommendations from general surgery.    Note-do not think patient has any GI bleed at this  point-she had some dark-colored vomitus-but on my exam-vomitus is bilious.  Apart from PPI-do not think she needs any further intervention at this point.  Plans are to monitor closely.  History of hypertension: Hypotensive on presentation-plans are to hold all antihypertensives-once BP improves-plans are to resume her usual medications.  COPD: Stable-no exacerbation-continue bronchodilators as ordered.  Glaucoma: Resume usual eyedrop regimen.  Chronic back pain: Narcotics as needed for now.  Alcohol abuse: Per daughter-drinks a significant amount of vodka on a daily basis-no signs of alcohol withdrawal currently-starting Ativan per CIWA protocol.  Monitor closely for DTs.  Further plan will depend as patient's clinical course evolves and further radiologic and laboratory data become available. Patient will be monitored closely.  Above noted plan was discussed with patient face to face at bedside, she was in agreement.   Family communication: Daughter-[name]-[phone]-on 12/20  CONSULTS: Gen Surgery  DVT Prophylaxis: Prophylactic Heparin  Code Status: Full Code  Disposition Plan:  Discharge back home vs SNF possibly in 3-4 days, depending on clinical course  Admission status:  Inpatient going to ICU  The patient is critically ill with multiple organ system failure and requires high complexity decision making for assessment and support, frequent evaluation and titration of therapies, advanced monitoring, review of radiographic studies and interpretation of complex data.     Total time spent  55 minutes.Greater than 50% of this time was spent in counseling, explanation of diagnosis, planning of further management, and coordination of care.  Severity of illness: The appropriate patient status for this patient is INPATIENT. Inpatient status is judged to be reasonable and necessary in order to provide the required intensity of service to ensure the patient's safety. The  patient's presenting symptoms, physical exam findings, and initial radiographic and laboratory data in the context of their chronic comorbidities is felt to place them at high risk for further clinical deterioration. Furthermore, it is not anticipated that the patient will be medically stable for discharge from the hospital within 2 midnights of admission. The following factors support the patient status of inpatient.   " The patient's presenting symptoms include vomiting. " The worrisome physical exam findings include abdominal distention " The initial radiographic and laboratory data are worrisome because of high-grade SBO, AKI, hyperkalemia " The chronic co-morbidities include hypertension,  COPD   * I certify that at the point of admission it is my clinical judgment that the patient will require inpatient hospital care spanning beyond 2 midnights from the point of admission due to high intensity of service, high risk for further deterioration and high frequency of surveillance required.  Jeoffrey Massed [facility] Pager [phone]  If 7PM-7AM, please contact night-coverage  Please page via [email or web address]  Go to [email or web address] and use [place]'s universal password to access. If you do not have the password, please contact the hospital operator.  Locate the [facility] provider you are looking for under [facility] and page to a number that you can be directly reached. If you still have difficulty reaching the provider, please page the [facility] (Director on Call) for the Hospitalists listed on amion for assistance.  02/28/2020, 12:19 PM

## 2020-02-28 NOTE — ED Notes (Signed)
Date and time results received: 02/28/20 1210   Test: Lactic Acid Critical Value: 3.5  Name of Provider Notified: Donn Pierini  Orders Received? Or Actions Taken?: Fluids ordered

## 2020-02-28 NOTE — Progress Notes (Signed)
Bladder scan showed 250 mL in bladder. Pt has not voided since being on the unit. MD made aware. Stated to continue to monitor and keep him aware and may place an order for a Vyolet Sakuma.

## 2020-02-29 ENCOUNTER — Other Ambulatory Visit: Payer: Medicare Other

## 2020-02-29 DIAGNOSIS — K56609 Unspecified intestinal obstruction, unspecified as to partial versus complete obstruction: Secondary | ICD-10-CM | POA: Diagnosis not present

## 2020-02-29 DIAGNOSIS — N179 Acute kidney failure, unspecified: Secondary | ICD-10-CM | POA: Diagnosis not present

## 2020-02-29 DIAGNOSIS — J449 Chronic obstructive pulmonary disease, unspecified: Secondary | ICD-10-CM | POA: Diagnosis not present

## 2020-02-29 DIAGNOSIS — E861 Hypovolemia: Secondary | ICD-10-CM

## 2020-02-29 DIAGNOSIS — I9589 Other hypotension: Secondary | ICD-10-CM | POA: Diagnosis not present

## 2020-02-29 LAB — COMPREHENSIVE METABOLIC PANEL
ALT: 15 U/L (ref 0–44)
AST: 25 U/L (ref 15–41)
Albumin: 3.1 g/dL — ABNORMAL LOW (ref 3.5–5.0)
Alkaline Phosphatase: 33 U/L — ABNORMAL LOW (ref 38–126)
Anion gap: 12 (ref 5–15)
BUN: 77 mg/dL — ABNORMAL HIGH (ref 8–23)
CO2: 26 mmol/L (ref 22–32)
Calcium: 8.3 mg/dL — ABNORMAL LOW (ref 8.9–10.3)
Chloride: 91 mmol/L — ABNORMAL LOW (ref 98–111)
Creatinine, Ser: 1.31 mg/dL — ABNORMAL HIGH (ref 0.44–1.00)
GFR, Estimated: 42 mL/min — ABNORMAL LOW (ref >60)
Glucose, Bld: 141 mg/dL — ABNORMAL HIGH (ref 70–99)
Potassium: 3.4 mmol/L — ABNORMAL LOW (ref 3.5–5.1)
Sodium: 129 mmol/L — ABNORMAL LOW (ref 135–145)
Total Bilirubin: 0.6 mg/dL (ref 0.3–1.2)
Total Protein: 5.6 g/dL — ABNORMAL LOW (ref 6.5–8.1)

## 2020-02-29 LAB — URINE CULTURE: Culture: NO GROWTH

## 2020-02-29 LAB — CBC
HCT: 39.3 % (ref 36.0–46.0)
Hemoglobin: 13.7 g/dL (ref 12.0–15.0)
MCH: 33.7 pg (ref 26.0–34.0)
MCHC: 34.9 g/dL (ref 30.0–36.0)
MCV: 96.6 fL (ref 80.0–100.0)
Platelets: 498 10*3/uL — ABNORMAL HIGH (ref 150–400)
RBC: 4.07 MIL/uL (ref 3.87–5.11)
RDW: 12.4 % (ref 11.5–15.5)
WBC: 5.3 10*3/uL (ref 4.0–10.5)
nRBC: 0 % (ref 0.0–0.2)

## 2020-02-29 LAB — MAGNESIUM: Magnesium: 2 mg/dL (ref 1.7–2.4)

## 2020-02-29 LAB — CREATININE, SERUM
Creatinine, Ser: 0.76 mg/dL (ref 0.44–1.00)
GFR, Estimated: >60 mL/min (ref >60)

## 2020-02-29 LAB — LACTIC ACID, PLASMA: Lactic Acid, Venous: 1.7 mmol/L (ref 0.5–1.9)

## 2020-02-29 LAB — CORTISOL: Cortisol, Plasma: >100 ug/dL

## 2020-02-29 MED ORDER — DEXMEDETOMIDINE HCL IN NACL 400 MCG/100ML IV SOLN
0.4000 ug/kg/h | INTRAVENOUS | Status: DC
  Administered 2020-02-29: 14:00:00 0.4 ug/kg/h via INTRAVENOUS
  Administered 2020-03-01 (×2): 0.8 ug/kg/h via INTRAVENOUS
  Administered 2020-03-01 – 2020-03-02 (×2): 1.2 ug/kg/h via INTRAVENOUS
  Administered 2020-03-03 – 2020-03-04 (×6): 1.4 ug/kg/h via INTRAVENOUS
  Administered 2020-03-04 – 2020-03-05 (×2): 1.2 ug/kg/h via INTRAVENOUS
  Administered 2020-03-05: 01:00:00 1.1 ug/kg/h via INTRAVENOUS
  Administered 2020-03-05 – 2020-03-07 (×7): 1.4 ug/kg/h via INTRAVENOUS
  Filled 2020-02-29 (×23): qty 100

## 2020-02-29 MED ORDER — POTASSIUM CHLORIDE 10 MEQ/100ML IV SOLN
10.0000 meq | INTRAVENOUS | Status: AC
  Administered 2020-02-29 (×3): 10 meq via INTRAVENOUS
  Filled 2020-02-29 (×3): qty 100

## 2020-02-29 MED ORDER — VANCOMYCIN HCL 500 MG/100ML IV SOLN
500.0000 mg | INTRAVENOUS | Status: DC
  Filled 2020-02-29: qty 100

## 2020-02-29 MED ORDER — PIPERACILLIN-TAZOBACTAM 3.375 G IVPB: 3.3750 g | Freq: Three times a day (TID) | INTRAVENOUS | Status: DC

## 2020-02-29 NOTE — Progress Notes (Addendum)
Patient started on Precedex drip at 1400 for increasing agitation. Agitation and CIWA scale has decreased since.

## 2020-02-29 NOTE — Progress Notes (Signed)
Initial Nutrition Assessment  DOCUMENTATION CODES:   Severe malnutrition in context of chronic illness  INTERVENTION:  Once patient is cleared for diet advancement RD will add appropriate ONS   NUTRITION DIAGNOSIS:  Severe Malnutrition related to acute illness,chronic illness (SBO and chronic COPD) as evidenced by moderate and severe fat depletion,severe muscle depletion. Weight loss- 8 kg (15%) the past 8 months.  GOAL:  Patient will meet greater than or equal to 90% of their needs  MONITOR:  PO intake,Supplement acceptance,Labs,Weight trends,Skin,I & O's  REASON FOR ASSESSMENT:   Malnutrition Screening Tool   ASSESSMENT: Patient is an underweight 74 yo female with history of COPD, GERD, HTN, HLD, daily alcohol and tobacco use. She presents from home with complaint of N/V and abdominal pain x 5 days. Hypovolemic shock, AKI, hypokalemia and small bowel obstruction. Receiving pressor support.   12/20- Central line placed. NGT placed right nare-bile, brown output 1.7 liters per nursing.  Patient discussed during rounds this morning. NPO currently. Patient limited her response to questions. Unable to provide details of diet history. No family present. Chart reviewed.    Medications reviewed and include: Folvite, MVI,Thimaine  IV-Lactated ringers@ 150 ml/hr   Drips: Levophed   Labs: BMP Latest Ref Rng & Units 02/29/2020 02/28/2020 02/28/2020  Glucose 70 - 99 mg/dL 308(M) 578(I) 696(E)  BUN 8 - 23 mg/dL 95(M) 84(X) 32(G)  Creatinine 0.44 - 1.00 mg/dL 4.01(U) 2.72(Z) 3.66(Y)  BUN/Creat Ratio 12 - 28 - - -  Sodium 135 - 145 mmol/L 129(L) 129(L) 129(L)  Potassium 3.5 - 5.1 mmol/L 3.4(L) 3.9 3.2(L)  Chloride 98 - 111 mmol/L 91(L) 90(L) 86(L)  CO2 22 - 32 mmol/L 26 24 20(L)  Calcium 8.9 - 10.3 mg/dL 8.3(L) 8.5(L) 9.6      Intake/Output Summary (Last 24 hours) at 02/29/2020 1527 Last data filed at 02/29/2020 1506 Gross per 24 hour  Intake 4781.52 ml  Output 3675 ml  Net  1106.52 ml   Patient weight has decreased ~ 2 kg since late September- not significant and down overall 8 kg / 15% since April 30th (8 months).   NUTRITION - FOCUSED PHYSICAL EXAM:  Flowsheet Row Most Recent Value  Orbital Region Moderate depletion  Upper Arm Region Severe depletion  Thoracic and Lumbar Region Severe depletion  Buccal Region Moderate depletion  Temple Region Mild depletion  Clavicle Bone Region Severe depletion  Clavicle and Acromion Bone Region Severe depletion  Scapular Bone Region Unable to assess  Dorsal Hand Mild depletion  Patellar Region Severe depletion  Anterior Thigh Region Severe depletion  Posterior Calf Region Severe depletion  Edema (RD Assessment) None  Hair Reviewed  Eyes Reviewed  Mouth Reviewed  Skin Reviewed  Nails Reviewed      Diet Order:   Diet Order            Diet NPO time specified  Diet effective now                 EDUCATION NEEDS:   Not appropriate for education at this time Skin:  Skin Assessment: Reviewed RN Assessment  Last BM:  12/15  Height:   Ht Readings from Last 1 Encounters:  02/28/20 5\' 5"  (1.651 m)    Weight:   Wt Readings from Last 1 Encounters:  02/29/20 46.3 kg    Ideal Body Weight:   57 kg  BMI:  Body mass index is 16.99 kg/m.  Estimated Nutritional Needs:   Kcal:  03/02/20  Protein:  78-87 gr  Fluid:  >1300 ml daily  Royann Shivers MS,RD,CSG,LDN Pager: Loretha Stapler

## 2020-02-29 NOTE — Progress Notes (Signed)
Patient is becoming increasingly more agitated as the day progresses. She has begun to pull off her leads, gown, and tug at her NG tube and Foley. Ativan being given prn per order and safety mitts placed on patient. Dr. Jerral Ralph notified of patients condition. Will continue to monitor.

## 2020-02-29 NOTE — Progress Notes (Addendum)
PROGRESS NOTE        PATIENT DETAILS Name: Samantha Huerta Age: 74 y.o. Sex: female Date of Birth: 1946-01-25 Admit Date: 02/28/2020 Admitting Physician [name] [name], MD QDI:[id number], [name], FNP  Brief Narrative: Patient is a 74 y.o. female with PMHx of COPD, HTN, glaucoma, chronic back pain, tobacco use, alcohol abuse-presented with 5-day history of abdominal pain, vomiting.  She was found to have hypovolemic shock, AKI and high-grade bowel obstruction.  She was kept n.p.o.-NGT was placed-IV fluid resuscitation was started-she was subsequently admitted to the ICU.  Even with IV fluid resuscitation-she remained hypotensive-hence right IJ central venous access was obtained-and patient was started on pressors.  See below for further details.  Significant events: 12/20>> admit for hypovolemic shock, AKI in the setting of bowel obstruction. 12/20>> persistently hypotensive in spite of IVF resuscitation-right IJ placed by surgery-started on Levophed  Significant studies: 12/20>> CT abdomen/pelvis: High-grade SBO related to adhesions in the proximal ileum 12/20>> chest x-ray: No pneumonia (personally reviewed)  Antimicrobial therapy: Vancomycin: 12/20>> 12/21 Zosyn: 12/20>> 12/21 Ceftriaxone: 12/20 x 1 in ED Flagyl: 12/20 x 1 in ED  Microbiology data: 12/20>> blood cultures: Pending  Procedures : 12/20>> right IJ central venous catheter placed by Dr. Henreitta Leber  Consults: General surgery  DVT Prophylaxis : heparin injection 5,000 Units Start: 02/28/20 1400   Subjective: Feels much better-no BM/flatus overnight.  No abdominal pain.  No chest pain or shortness of breath.   Assessment/Plan: Hypovolemic shock: Due to severe dehydration/vomiting-BP improving after initiation of Levophed-AKI better as well.  Continue IVF-volume status stable-nursing staff to attempt to titrate down Levophed to off. Random cortisol >100! Doubt sepsis  physiology-empirically started on antimicrobial therapy on 12/20-suspect we can discontinue today.  AKI: Hemodynamically mediated due to vomiting and lisinopril use.  UA without proteinuria, CT abdomen without hydronephrosis.  Renal function improving-although not much urine output overnight-continue Foley catheter x1 more day for strict intake/output monitoring-if renal function/urine output continues to improve-suspect Foley catheter can be discontinued on 12/22.  Avoid nephrotoxic agents-repeat electrolytes in a.m.  Hypokalemia: Due to GI loss-NG tube suctioning-continue to replete.  Repeat labs in a.m.  Hyponatremia: Mild: Due to hypovolemia-continue gentle IV fluid hydration-repeat electrolytes tomorrow.  SBO: Remains n.p.o.-NG tube in place-no BM or flatus yet-await further input from general surgery.  COPD: Stable-continue bronchodilators.  Glaucoma: Continue eyedrops.  Tobacco abuse: Counseled-declines transdermal nicotine.  Alcohol abuse: Drinks significant amount of vodka daily-claims last drink was on 12/18-no signs of withdrawal as of now.  On Ativan per CIWA protocol.  Addendum-patient gradually getting more confused per RN-not responding to Ativan-starting Precedex.  Chronic back pain: On as needed narcotics  Diet: Diet Order            Diet NPO time specified  Diet effective now                  Code Status: Full code   Family Communication: Daughter-[name]-[phone]-on 12/20-left voicemail on 12./21  Disposition Plan: Status is: Inpatient  Remains inpatient appropriate because:Inpatient level of care appropriate due to severity of illness   Dispo: The patient is from: Home              Anticipated d/c is to: Home              Anticipated d/c date is: 3 days  Patient currently is not medically stable to d/c.    Barriers to Discharge: SBO-AKI-Hypovolumic shock on Levophed-remains NPO  Antimicrobial agents: Anti-infectives (From  admission, onward)   Start     Dose/Rate Route Frequency Ordered Stop   02/29/20 2200  vancomycin (VANCOREADY) IVPB 500 mg/100 mL        500 mg 100 mL/hr over 60 Minutes Intravenous Every 24 hours 02/29/20 0851     02/29/20 1400  piperacillin-tazobactam (ZOSYN) IVPB 3.375 g        3.375 g 12.5 mL/hr over 240 Minutes Intravenous Every 8 hours 02/29/20 0855     02/28/20 1900  vancomycin (VANCOCIN) IVPB 1000 mg/200 mL premix        1,000 mg 200 mL/hr over 60 Minutes Intravenous  Once 02/28/20 1825 02/29/20 0729   02/28/20 1845  piperacillin-tazobactam (ZOSYN) IVPB 3.375 g  Status:  Discontinued        3.375 g 12.5 mL/hr over 240 Minutes Intravenous Every 12 hours 02/28/20 1831 02/29/20 0855   02/28/20 1830  piperacillin-tazobactam (ZOSYN) IVPB 3.375 g  Status:  Discontinued        3.375 g 100 mL/hr over 30 Minutes Intravenous Every 12 hours 02/28/20 1825 02/28/20 1831   02/28/20 1825  vancomycin variable dose per unstable renal function (pharmacist dosing)  Status:  Discontinued         Does not apply See admin instructions 02/28/20 1825 02/29/20 0851   02/28/20 0930  cefTRIAXone (ROCEPHIN) 2 g in sodium chloride 0.9 % 100 mL IVPB        2 g 200 mL/hr over 30 Minutes Intravenous  Once 02/28/20 0920 02/28/20 1017   02/28/20 0930  metroNIDAZOLE (FLAGYL) IVPB 500 mg        500 mg 100 mL/hr over 60 Minutes Intravenous  Once 02/28/20 0920 02/28/20 1057       Time spent: 35 minutes-Greater than 50% of this time was spent in counseling, explanation of diagnosis, planning of further management, and coordination of care.  MEDICATIONS: Scheduled Meds:  arformoterol  15 mcg Nebulization BID   brinzolamide  1 drop Both Eyes BID   Chlorhexidine Gluconate Cloth  6 each Topical Daily   Chlorhexidine Gluconate Cloth  6 each Topical Q0600   folic acid  1 mg Oral Daily   heparin  5,000 Units Subcutaneous Q8H   latanoprost  1 drop Both Eyes QHS   multivitamin with minerals  1 tablet Oral  Daily   mupirocin ointment  1 application Nasal BID   pantoprazole (PROTONIX) IV  40 mg Intravenous Q12H   thiamine  100 mg Oral Daily   Or   thiamine  100 mg Intravenous Daily   umeclidinium bromide  1 puff Inhalation Daily   Continuous Infusions:  lactated ringers 150 mL/hr at 02/29/20 0248   norepinephrine (LEVOPHED) Adult infusion 12 mcg/min (02/29/20 0252)   piperacillin-tazobactam (ZOSYN)  IV     potassium chloride 10 mEq (02/29/20 0846)   sodium chloride 250 mL (02/28/20 1648)   vancomycin     PRN Meds:.acetaminophen **OR** acetaminophen, albuterol, LORazepam **OR** LORazepam, morphine injection, ondansetron **OR** ondansetron (ZOFRAN) IV, sodium chloride   PHYSICAL EXAM: Vital signs: Vitals:   02/29/20 0730 02/29/20 0800 02/29/20 0847 02/29/20 0850  BP: 128/61 121/61    Pulse: 83 89    Resp: (!) 21 (!) 21    Temp:  98.5 F (36.9 C)    TempSrc:  Oral    SpO2: 100% 100% 100% 100%  Weight:  Height:       Filed Weights   02/28/20 0856 02/28/20 1455 02/29/20 0506  Weight: 45.4 kg 44.9 kg 46.3 kg   Body mass index is 16.99 kg/m.   Gen Exam:Alert awake-not in any distress HEENT:atraumatic, normocephalic Chest: B/L clear to auscultation anteriorly CVS:S1S2 regular Abdomen:soft non tender, non distended Extremities:no edema Neurology: Non focal Skin: no rash  I have personally reviewed following labs and imaging studies  LABORATORY DATA: CBC: Recent Labs  Lab 02/28/20 0913 02/28/20 2035 02/29/20 0429  WBC 4.4 5.5 5.3  NEUTROABS 3.0  --   --   HGB 15.2* 13.9 13.7  HCT 44.6 40.5 39.3  MCV 100.0 97.4 96.6  PLT 522* 514* 498*    Basic Metabolic Panel: Recent Labs  Lab 02/28/20 0913 02/28/20 0957 02/28/20 2035 02/29/20 0429  NA 129*  --  129* 129*  K 3.2*  --  3.9 3.4*  CL 86*  --  90* 91*  CO2 20*  --  24 26  GLUCOSE 113*  --  111* 141*  BUN 99*  --  86* 77*  CREATININE 2.02*  --  1.73* 1.31*  CALCIUM 9.6  --  8.5* 8.3*  MG   --  2.1 2.2 2.0  PHOS  --   --  5.0*  --     GFR: Estimated Creatinine Clearance: 27.1 mL/min (A) (by C-G formula based on SCr of 1.31 mg/dL (H)).  Liver Function Tests: Recent Labs  Lab 02/28/20 0913 02/28/20 2035 02/29/20 0429  AST ALT ALKPHOS 51 35* 33*  BILITOT 1.4* 0.8 0.6  PROT 6.9 5.5* 5.6*  ALBUMIN 4.1 3.0* 3.1*   Recent Labs  Lab 02/28/20 0913  LIPASE 36   No results for input(s): AMMONIA in the last 168 hours.  Coagulation Profile: Recent Labs  Lab 02/28/20 0913  INR 0.9    Cardiac Enzymes: No results for input(s): CKTOTAL, CKMB, CKMBINDEX, TROPONINI in the last 168 hours.  BNP (last 3 results) No results for input(s): PROBNP in the last 8760 hours.  Lipid Profile: No results for input(s): CHOL, HDL, LDLCALC, TRIG, CHOLHDL, LDLDIRECT in the last 72 hours.  Thyroid Function Tests: No results for input(s): TSH, T4TOTAL, FREET4, T3FREE, THYROIDAB in the last 72 hours.  Anemia Panel: No results for input(s): VITAMINB12, FOLATE, FERRITIN, TIBC, IRON, RETICCTPCT in the last 72 hours.  Urine analysis:    Component Value Date/Time   COLORURINE AMBER (A) 02/28/2020 2200   APPEARANCEUR HAZY (A) 02/28/2020 2200   APPEARANCEUR Clear 03/20/2017 1112   LABSPEC 1.021 02/28/2020 2200   PHURINE 5.0 02/28/2020 2200   GLUCOSEU NEGATIVE 02/28/2020 2200   HGBUR NEGATIVE 02/28/2020 2200   BILIRUBINUR SMALL (A) 02/28/2020 2200   BILIRUBINUR Negative 03/20/2017 1112   KETONESUR 5 (A) 02/28/2020 2200   PROTEINUR NEGATIVE 02/28/2020 2200   UROBILINOGEN negative 01/25/2014 1529   NITRITE NEGATIVE 02/28/2020 2200   LEUKOCYTESUR NEGATIVE 02/28/2020 2200    Sepsis Labs: Lactic Acid, Venous    Component Value Date/Time   LATICACIDVEN 1.7 02/29/2020 0429    MICROBIOLOGY: Recent Results (from the past 240 hour(s))  Blood Culture (routine x 2)     Status: None (Preliminary result)   Collection Time: 02/28/20  9:14 AM   Specimen: Blood   Result Value Ref Range Status   Specimen Description BLOOD RIGHT ANTECUBITAL  Final   Special Requests   Final    BOTTLES DRAWN AEROBIC AND ANAEROBIC Blood Culture results may  not be optimal due to an inadequate volume of blood received in culture bottles Performed at [facility], [address] [place], [place] [address]    Culture PENDING  Incomplete   Report Status PENDING  Incomplete  Resp Panel by RT-PCR (Flu A&B, Covid) Nasopharyngeal Swab     Status: None   Collection Time: 02/28/20  9:20 AM   Specimen: Nasopharyngeal Swab; Nasopharyngeal(NP) swabs in vial transport medium  Result Value Ref Range Status   SARS Coronavirus 2 by RT PCR NEGATIVE NEGATIVE Final    Comment: (NOTE) SARS-CoV-2 target nucleic acids are NOT DETECTED.  The SARS-CoV-2 RNA is generally detectable in upper respiratory specimens during the acute phase of infection. The lowest concentration of SARS-CoV-2 viral copies this assay can detect is 138 copies/mL. A negative result does not preclude SARS-Cov-2 infection and should not be used as the sole basis for treatment or other patient management decisions. A negative result may occur with  improper specimen collection/handling, submission of specimen other than nasopharyngeal swab, presence of viral mutation(s) within the areas targeted by this assay, and inadequate number of viral copies(<138 copies/mL). A negative result must be combined with clinical observations, patient history, and epidemiological information. The expected result is Negative.  Fact Sheet for Patients:  BloggerCourse.com  Fact Sheet for Healthcare Providers:  SeriousBroker.it  This test is no t yet approved or cleared by the [place] FDA and  has been authorized for detection and/or diagnosis of SARS-CoV-2 by FDA under an Emergency Use Authorization (EUA). This EUA will remain  in effect (meaning this test can be used) for the  duration of the COVID-19 declaration under Section 564(b)(1) of the Act, 21 U.S.C.section 360bbb-3(b)(1), unless the authorization is terminated  or revoked sooner.       Influenza A by PCR NEGATIVE NEGATIVE Final   Influenza B by PCR NEGATIVE NEGATIVE Final    Comment: (NOTE) The Xpert Xpress SARS-CoV-2/FLU/RSV plus assay is intended as an aid in the diagnosis of influenza from Nasopharyngeal swab specimens and should not be used as a sole basis for treatment. Nasal washings and aspirates are unacceptable for Xpert Xpress SARS-CoV-2/FLU/RSV testing.  Fact Sheet for Patients: BloggerCourse.com  Fact Sheet for Healthcare Providers: SeriousBroker.it  This test is not yet approved or cleared by the [place] FDA and has been authorized for detection and/or diagnosis of SARS-CoV-2 by FDA under an Emergency Use Authorization (EUA). This EUA will remain in effect (meaning this test can be used) for the duration of the COVID-19 declaration under Section 564(b)(1) of the Act, 21 U.S.C. section 360bbb-3(b)(1), unless the authorization is terminated or revoked.  Performed at [facility], [address] [place], [place] [address]   MRSA PCR Screening     Status: Abnormal   Collection Time: 02/28/20  2:35 PM   Specimen: Nasopharyngeal  Result Value Ref Range Status   MRSA by PCR POSITIVE (A) NEGATIVE Final    Comment:        The GeneXpert MRSA Assay (FDA approved for NASAL specimens only), is one component of a comprehensive MRSA colonization surveillance program. It is not intended to diagnose MRSA infection nor to guide or monitor treatment for MRSA infections. RESULT CALLED TO, READ BACK BY AND VERIFIED WITH: SHELTON,A AT 1632 BY HUFFINES,S ON 02/28/20. Performed at [facility], [address], [place], [place] [address]     RADIOLOGY STUDIES/RESULTS: CT ABDOMEN PELVIS WO CONTRAST  Result Date: 02/28/2020 CLINICAL  DATA:  Nausea and vomiting for several days  EXAM: CT ABDOMEN AND PELVIS WITHOUT CONTRAST TECHNIQUE: Multidetector CT imaging of the abdomen and pelvis was performed following the standard protocol without IV contrast. COMPARISON:  None. FINDINGS: Lower chest: No acute abnormality. Hepatobiliary: No focal liver abnormality is seen. No gallstones, gallbladder wall thickening, or biliary dilatation. Pancreas: Unremarkable. No pancreatic ductal dilatation or surrounding inflammatory changes. Spleen: Normal in size without focal abnormality. Adrenals/Urinary Tract: Adrenal glands are within normal limits. Kidneys are well visualized bilaterally. No renal calculi or obstructive changes are seen. Bladder is decompressed. Stomach/Bowel: Colon shows diverticular change without evidence of obstructive or inflammatory change. Colon is predominately decompressed although dense material is noted within the colon which may be related to ingested material. The appendix is not visualized and surgical clips are noted likely related to prior removal. The stomach is significantly distended with fluid as is the small bowel throughout the jejunum in into the proximal ileum. There is a transition zone identified in the mid abdomen best seen on image number 44 of series 2 and image number 41 of series 5. This is likely related to adhesions as no definitive mass is seen. The more distal small bowel is unremarkable. Vascular/Lymphatic: Aortic atherosclerosis. No enlarged abdominal or pelvic lymph nodes. Reproductive: Status post hysterectomy. No adnexal masses. Other: No abdominal wall hernia or abnormality. No abdominopelvic ascites. Musculoskeletal: Degenerative changes of lumbar spine are noted. No acute compression deformity is noted. IMPRESSION: High-grade small bowel obstruction likely related to adhesions in the proximal ileum. More distal small bowel and colon are decompressed. No other focal abnormality is noted. Electronically  Signed   By: Alcide Clever M.D.   On: 02/28/2020 11:10   DG Chest Port 1 View  Result Date: 02/28/2020 CLINICAL DATA:  74 year old female status post central line placement. EXAM: PORTABLE CHEST 1 VIEW COMPARISON:  Chest radiograph dated 04/05/2016 and CT dated 07/08/2019. FINDINGS: Right IJ central venous line with tip over central SVC. Enteric tube with tip in the distal stomach. There is background of emphysema. No focal consolidation, pleural effusion or pneumothorax. The cardiac silhouette is within limits. Atherosclerotic calcification of the aorta. Osteopenia with degenerative changes of the spine and shoulders. Old healed right hip fractures. No acute osseous pathology. IMPRESSION: Right IJ central venous line with tip over central SVC. Electronically Signed   By: Elgie Collard M.D.   On: 02/28/2020 18:46   DG Abdomen Acute W/Chest  Result Date: 02/28/2020 CLINICAL DATA:  Abdominal pain, hypotension, vomiting EXAM: DG ABDOMEN ACUTE WITH 1 VIEW CHEST COMPARISON:  CT 07/08/2019 FINDINGS: Heart size and mediastinal contours are within normal limits. Aortic Atherosclerosis (ICD10-170.0). Lungs hyperinflated with chronic coarse attenuated bronchovascular markings. No focal infiltrate. No free air. Gas distended small bowel loops in the lower abdomen, left greater than right, only slightly increased since previous abdominal radiograph 03/20/2017. Surgical clips in the lower pelvis. The colon is decompressed. There are no abnormal calcifications. Multilevel lumbar spondylitic change. No fracture or worrisome bone lesion. IMPRESSION: 1. Small bowel distention suggesting  ileus or obstruction. 2. No free air. Electronically Signed   By: Corlis Leak M.D.   On: 02/28/2020 09:46   [place] EKG SITE RITE  Result Date: 02/28/2020 If Site Rite image not attached, placement could not be confirmed due to current cardiac rhythm.    LOS: 1 day   Jeoffrey Massed, MD  [facility]    To contact the  attending provider between 7A-7P or the covering provider during after hours 7P-7A, please log into the web site  [email or web address] and access using universal [facility] password for that web site. If you do not have the password, please call the hospital operator.  02/29/2020, 9:09 AM

## 2020-02-29 NOTE — TOC Initial Note (Signed)
Transition of Care [facility]) - Initial/Assessment Note    Patient Details  Name: Samantha Huerta MRN: [id number] Date of Birth: 03/18/1945  [facility]) CM/SW Contact:    Karn Cassis, LCSW Phone Number: 02/29/2020, 11:36 AM  Clinical Narrative: Pt admitted due to hypovolemic shock. TOC consulted due to substance use. Pt reports she lives alone. Her daughter lives in [place], but she does have 2 nieces who live locally and assist if needed. Pt ambulates with a walker. She plans to return home when medically stable. LCSW discussed substance use and pt said she started drinking in the spring after she retired because she was bored. She decided to quit a week ago because she wanted to. Prior to this, she admits to drinking a fifth of liquor a couple times of week. Pt indicates she can stop on her own and does not feel this has been a problem for her. She declines treatment resources and said she will contact her PCP if resources are needed in the future.                    Expected Discharge Plan: Home/Self Care Barriers to Discharge: Continued Medical Work up   Patient Goals and CMS Choice Patient states their goals for this hospitalization and ongoing recovery are:: return home      Expected Discharge Plan and Services Expected Discharge Plan: Home/Self Care In-house Referral: Clinical Social Work     Living arrangements for the past 2 months: Single Family Home                 DME Arranged: N/A           HH Agency: NA        Prior Living Arrangements/Services Living arrangements for the past 2 months: Single Family Home Lives with:: Self Patient language and need for interpreter reviewed:: Yes Do you feel safe going back to the place where you live?: Yes      Need for Family Participation in Patient Care: No (Comment)   Current home services: DME (cane, walker, shower chair) Criminal Activity/Legal Involvement Pertinent to Current  Situation/Hospitalization: No - Comment as needed  Activities of Daily Living Home Assistive Devices/Equipment: Dentures (specify type),Bedside commode/3-in-1,Walker (specify type),Shower chair with back,Cane (specify quad or straight) ADL Screening (condition at time of admission) Patient's cognitive ability adequate to safely complete daily activities?: Yes Is the patient deaf or have difficulty hearing?: No Does the patient have difficulty seeing, even when wearing glasses/contacts?: No Does the patient have difficulty concentrating, remembering, or making decisions?: No Patient able to express need for assistance with ADLs?: Yes Does the patient have difficulty dressing or bathing?: No Independently performs ADLs?: Yes (appropriate for developmental age) Does the patient have difficulty walking or climbing stairs?: Yes Weakness of Legs: Both Weakness of Arms/Hands: None  Permission Sought/Granted                  Emotional Assessment   Attitude/Demeanor/Rapport: Engaged Affect (typically observed): Accepting Orientation: : Oriented to Self,Oriented to Place,Oriented to  Time,Oriented to Situation Alcohol / Substance Use: Alcohol Use Psych Involvement: No (comment)  Admission diagnosis:  SBO (small bowel obstruction) (HCC) [K56.609] Patient Active Problem List   Diagnosis Date Noted  . SBO (small bowel obstruction) (HCC) 02/28/2020  . AKI (acute kidney injury) (HCC)   . Hypotension due to hypovolemia   . Opiate dependence (HCC) 12/02/2019  . Controlled substance agreement signed 12/02/2019  . Vitamin  D deficiency 03/25/2019  . Chronic midline low back pain without sciatica 12/25/2018  . Heme positive stool 07/31/2017  . Lung nodule 04/03/2017  . Pain management contract signed 01/05/2016  . Chronic back pain 05/12/2013  . Hypertension 07/27/2012  . Hyperlipidemia 07/27/2012  . GERD (gastroesophageal reflux disease) 07/27/2012  . COPD (chronic obstructive pulmonary  disease) with chronic bronchitis (HCC) 07/27/2012  . Glaucoma 07/27/2012   PCP:  [name], FNP Pharmacy:   [facility] [place], [place] - [address] [place] [address] Phone: [phone] Fax: [phone]     Social Determinants of Health (SDOH) Interventions    Readmission Risk Interventions Readmission Risk Prevention Plan 02/29/2020  Transportation Screening Complete  HRI or Home Care Consult Complete  Social Work Consult for Recovery Care Planning/Counseling Complete  Palliative Care Screening Not Applicable  Medication Review Oceanographer) Complete  Some recent data might be hidden

## 2020-02-29 NOTE — Progress Notes (Signed)
Pharmacy Antibiotic Note  Samantha Huerta is a 74 y.o. female admitted on 02/28/2020 with SBO and hypovolemic shock secondary to GI loss due to vomiting; LA 3.5.  Pharmacy has been consulted for Zosyn and vancomycin dosing for sepsis. WBC 4.4, afebrile; Scr 2.02> 1.36, CrCl 27.1 ml/min (baseline Scr appears to be ~0.65-0.74; pt with AKI in the setting of hypotension/vomiting and ACE inhibitor use) SBO with abdominal pain that has improved.   Plan: Increase Zosyn 3.375 gm IV Q8 hrs EID over 4 hours Vancomycin 500mg  IV q24h Monitor WBC, temp, clinical improvement, cultures, renal function, vancomycin levels  Height: 5\' 5"  (165.1 cm) Weight: 46.3 kg (102 lb 1.2 oz) IBW/kg (Calculated) : 57  Temp (24hrs), Avg:97.5 F (36.4 C), Min:96.8 F (36 C), Max:98.2 F (36.8 C)  Recent Labs  Lab 02/28/20 0913 02/28/20 1145 02/28/20 2035 02/29/20 0429  WBC 4.4  --  5.5 5.3  CREATININE 2.02*  --  1.73* 1.31*  LATICACIDVEN 2.4* 3.5*  --  1.7    Estimated Creatinine Clearance: 27.1 mL/min (A) (by C-G formula based on SCr of 1.31 mg/dL (H)).    No Known Allergies  Antimicrobials this admission: 12/20 ceftriaxone X 1 12/20 metronidazole IV X 1 12/20 Zosyn >> 12/20 Vancomycin >>  Microbiology results: 12/20 BCx X 2: in process 12/20 UCx: pending 12/20 MRSA PCR: positivie 12/20 COVID, flu A, flu B: negative  Thank you for allowing pharmacy to be a part of this patient's care.  1/21, BS Pharm D, 1/21 Clinical Pharmacist Pager [phone] 02/29/2020 8:31 AM

## 2020-02-29 NOTE — Progress Notes (Signed)
[facility] Progress Note     Subjective: Abdomen less distended this AM. Ng with high output.  Says no pain this AM. Looks like patient is more confused based on notes compared to this Am and is on CIWA protocol now for agitation.    Objective: Vital signs in last 24 hours: Temp:  [97.1 F (36.2 C)-98.5 F (36.9 C)] 98.5 F (36.9 C) (12/21 0800) Pulse Rate:  [70-106] 70 (12/21 1600) Resp:  [15-32] 15 (12/21 1600) BP: (71-139)/(44-118) 105/55 (12/21 1600) SpO2:  [100 %] 100 % (12/21 1600) Weight:  [46.3 kg] 46.3 kg (12/21 0506) Last BM Date: 02/23/20  Intake/Output from previous day: 12/20 0701 - 12/21 0700 In: 6736.8 [I.V.:3693.6; NG/GT:50; IV Piggyback:2993.3] Out: 3675 [Urine:325; Emesis/NG output:3350] Intake/Output this shift: Total I/O In: 2182.9 [I.V.:1859.3; IV Piggyback:323.6] Out: 1000 [Emesis/NG output:1000]  General appearance: alert and no distress Resp: normal work of breathing GI: soft, moderately distended, nontender  Lab Results:  Recent Labs    02/28/20 2035 02/29/20 0429  WBC 5.5 5.3  HGB 13.9 13.7  HCT 40.5 39.3  PLT 514* 498*   BMET Recent Labs    02/28/20 2035 02/29/20 0429  NA 129* 129*  K 3.9 3.4*  CL 90* 91*  CO2 24 26  GLUCOSE 111* 141*  BUN 86* 77*  CREATININE 1.73* 1.31*  CALCIUM 8.5* 8.3*   PT/INR Recent Labs    02/28/20 0913  LABPROT 12.2  INR 0.9    Studies/Results: CT ABDOMEN PELVIS WO CONTRAST  Result Date: 02/28/2020 CLINICAL DATA:  Nausea and vomiting for several days EXAM: CT ABDOMEN AND PELVIS WITHOUT CONTRAST TECHNIQUE: Multidetector CT imaging of the abdomen and pelvis was performed following the standard protocol without IV contrast. COMPARISON:  None. FINDINGS: Lower chest: No acute abnormality. Hepatobiliary: No focal liver abnormality is seen. No gallstones, gallbladder wall thickening, or biliary dilatation. Pancreas: Unremarkable. No pancreatic ductal dilatation or surrounding  inflammatory changes. Spleen: Normal in size without focal abnormality. Adrenals/Urinary Tract: Adrenal glands are within normal limits. Kidneys are well visualized bilaterally. No renal calculi or obstructive changes are seen. Bladder is decompressed. Stomach/Bowel: Colon shows diverticular change without evidence of obstructive or inflammatory change. Colon is predominately decompressed although dense material is noted within the colon which may be related to ingested material. The appendix is not visualized and surgical clips are noted likely related to prior removal. The stomach is significantly distended with fluid as is the small bowel throughout the jejunum in into the proximal ileum. There is a transition zone identified in the mid abdomen best seen on image number 44 of series 2 and image number 41 of series 5. This is likely related to adhesions as no definitive mass is seen. The more distal small bowel is unremarkable. Vascular/Lymphatic: Aortic atherosclerosis. No enlarged abdominal or pelvic lymph nodes. Reproductive: Status post hysterectomy. No adnexal masses. Other: No abdominal wall hernia or abnormality. No abdominopelvic ascites. Musculoskeletal: Degenerative changes of lumbar spine are noted. No acute compression deformity is noted. IMPRESSION: High-grade small bowel obstruction likely related to adhesions in the proximal ileum. More distal small bowel and colon are decompressed. No other focal abnormality is noted. Electronically Signed   By: Alcide Clever M.D.   On: 02/28/2020 11:10   DG Chest Port 1 View  Result Date: 02/28/2020 CLINICAL DATA:  74 year old female status post central line placement. EXAM: PORTABLE CHEST 1 VIEW COMPARISON:  Chest radiograph dated 04/05/2016 and CT dated 07/08/2019. FINDINGS: Right IJ central venous line with tip  over central SVC. Enteric tube with tip in the distal stomach. There is background of emphysema. No focal consolidation, pleural effusion or  pneumothorax. The cardiac silhouette is within limits. Atherosclerotic calcification of the aorta. Osteopenia with degenerative changes of the spine and shoulders. Old healed right hip fractures. No acute osseous pathology. IMPRESSION: Right IJ central venous line with tip over central SVC. Electronically Signed   By: Elgie Collard M.D.   On: 02/28/2020 18:46   DG Abdomen Acute W/Chest  Result Date: 02/28/2020 CLINICAL DATA:  Abdominal pain, hypotension, vomiting EXAM: DG ABDOMEN ACUTE WITH 1 VIEW CHEST COMPARISON:  CT 07/08/2019 FINDINGS: Heart size and mediastinal contours are within normal limits. Aortic Atherosclerosis (ICD10-170.0). Lungs hyperinflated with chronic coarse attenuated bronchovascular markings. No focal infiltrate. No free air. Gas distended small bowel loops in the lower abdomen, left greater than right, only slightly increased since previous abdominal radiograph 03/20/2017. Surgical clips in the lower pelvis. The colon is decompressed. There are no abnormal calcifications. Multilevel lumbar spondylitic change. No fracture or worrisome bone lesion. IMPRESSION: 1. Small bowel distention suggesting  ileus or obstruction. 2. No free air. Electronically Signed   By: Corlis Leak M.D.   On: 02/28/2020 09:46   [place] EKG SITE RITE  Result Date: 02/28/2020 If Site Rite image not attached, placement could not be confirmed due to current cardiac rhythm.   Anti-infectives: Anti-infectives (From admission, onward)   Start     Dose/Rate Route Frequency Ordered Stop   02/29/20 2200  vancomycin (VANCOREADY) IVPB 500 mg/100 mL  Status:  Discontinued        500 mg 100 mL/hr over 60 Minutes Intravenous Every 24 hours 02/29/20 0851 02/29/20 1001   02/29/20 1400  piperacillin-tazobactam (ZOSYN) IVPB 3.375 g  Status:  Discontinued        3.375 g 12.5 mL/hr over 240 Minutes Intravenous Every 8 hours 02/29/20 0855 02/29/20 1001   02/28/20 1900  vancomycin (VANCOCIN) IVPB 1000 mg/200 mL premix         1,000 mg 200 mL/hr over 60 Minutes Intravenous  Once 02/28/20 1825 02/29/20 0729   02/28/20 1845  piperacillin-tazobactam (ZOSYN) IVPB 3.375 g  Status:  Discontinued        3.375 g 12.5 mL/hr over 240 Minutes Intravenous Every 12 hours 02/28/20 1831 02/29/20 0855   02/28/20 1830  piperacillin-tazobactam (ZOSYN) IVPB 3.375 g  Status:  Discontinued        3.375 g 100 mL/hr over 30 Minutes Intravenous Every 12 hours 02/28/20 1825 02/28/20 1831   02/28/20 1825  vancomycin variable dose per unstable renal function (pharmacist dosing)  Status:  Discontinued         Does not apply See admin instructions 02/28/20 1825 02/29/20 0851   02/28/20 0930  cefTRIAXone (ROCEPHIN) 2 g in sodium chloride 0.9 % 100 mL IVPB        2 g 200 mL/hr over 30 Minutes Intravenous  Once 02/28/20 0920 02/28/20 1017   02/28/20 0930  metroNIDAZOLE (FLAGYL) IVPB 500 mg        500 mg 100 mL/hr over 60 Minutes Intravenous  Once 02/28/20 0920 02/28/20 1057      Assessment/Plan: Samantha Huerta is a 74 yo with SBO from likely adhesions. Dehydrated and getting resuscitated with improving in her AKI. On just a little levophed this AM. Lactic had trended down and abdomen was reassuring this Am.  -NPO, NG, do not want to rush into SBFT due to patient being agitated and having so much output  yesterday at 3L  -Will continue to assess and do KUB in AM  -CIWA for alcohol and getting on precedex by hospitalist team for withdrawal/ DTs   Discussed with Team.    LOS: 1 day    Lucretia Roers 02/29/2020

## 2020-03-01 ENCOUNTER — Inpatient Hospital Stay (HOSPITAL_COMMUNITY): Payer: Medicare Other

## 2020-03-01 ENCOUNTER — Other Ambulatory Visit: Payer: Self-pay

## 2020-03-01 ENCOUNTER — Encounter (HOSPITAL_COMMUNITY): Payer: Self-pay | Admitting: Internal Medicine

## 2020-03-01 DIAGNOSIS — K56609 Unspecified intestinal obstruction, unspecified as to partial versus complete obstruction: Secondary | ICD-10-CM | POA: Diagnosis not present

## 2020-03-01 DIAGNOSIS — E43 Unspecified severe protein-calorie malnutrition: Secondary | ICD-10-CM | POA: Insufficient documentation

## 2020-03-01 LAB — COMPREHENSIVE METABOLIC PANEL
ALT: 12 U/L (ref 0–44)
AST: 20 U/L (ref 15–41)
Albumin: 2.6 g/dL — ABNORMAL LOW (ref 3.5–5.0)
Alkaline Phosphatase: 41 U/L (ref 38–126)
Anion gap: 11 (ref 5–15)
BUN: 41 mg/dL — ABNORMAL HIGH (ref 8–23)
CO2: 28 mmol/L (ref 22–32)
Calcium: 8.4 mg/dL — ABNORMAL LOW (ref 8.9–10.3)
Chloride: 92 mmol/L — ABNORMAL LOW (ref 98–111)
Creatinine, Ser: 0.62 mg/dL (ref 0.44–1.00)
GFR, Estimated: >60 mL/min (ref >60)
Glucose, Bld: 91 mg/dL (ref 70–99)
Potassium: 3 mmol/L — ABNORMAL LOW (ref 3.5–5.1)
Sodium: 131 mmol/L — ABNORMAL LOW (ref 135–145)
Total Bilirubin: 1 mg/dL (ref 0.3–1.2)
Total Protein: 5.3 g/dL — ABNORMAL LOW (ref 6.5–8.1)

## 2020-03-01 LAB — CBC
HCT: 37.1 % (ref 36.0–46.0)
Hemoglobin: 12.7 g/dL (ref 12.0–15.0)
MCH: 33.5 pg (ref 26.0–34.0)
MCHC: 34.2 g/dL (ref 30.0–36.0)
MCV: 97.9 fL (ref 80.0–100.0)
Platelets: 398 10*3/uL (ref 150–400)
RBC: 3.79 MIL/uL — ABNORMAL LOW (ref 3.87–5.11)
RDW: 12.5 % (ref 11.5–15.5)
WBC: 17.1 10*3/uL — ABNORMAL HIGH (ref 4.0–10.5)
nRBC: 0 % (ref 0.0–0.2)

## 2020-03-01 MED ORDER — POTASSIUM CHLORIDE CRYS ER 20 MEQ PO TBCR: 40.0000 meq | EXTENDED_RELEASE_TABLET | ORAL | Status: AC

## 2020-03-01 MED ORDER — CHLORHEXIDINE GLUCONATE 0.12 % MT SOLN
15.0000 mL | Freq: Two times a day (BID) | OROMUCOSAL | Status: DC
  Administered 2020-03-01 – 2020-03-10 (×17): 15 mL via OROMUCOSAL
  Filled 2020-03-01 (×15): qty 15

## 2020-03-01 MED ORDER — ORAL CARE MOUTH RINSE
15.0000 mL | Freq: Two times a day (BID) | OROMUCOSAL | Status: DC
  Administered 2020-03-01 – 2020-03-10 (×17): 15 mL via OROMUCOSAL

## 2020-03-01 MED ORDER — DIATRIZOATE MEGLUMINE & SODIUM 66-10 % PO SOLN
90.0000 mL | Freq: Once | ORAL | Status: AC
  Administered 2020-03-01: 13:00:00 90 mL via NASOGASTRIC
  Filled 2020-03-01: qty 90

## 2020-03-01 MED ORDER — POTASSIUM CHLORIDE 10 MEQ/100ML IV SOLN
10.0000 meq | INTRAVENOUS | Status: AC
  Administered 2020-03-01 (×4): 10 meq via INTRAVENOUS
  Filled 2020-03-01 (×2): qty 100

## 2020-03-01 MED ORDER — DIATRIZOATE MEGLUMINE & SODIUM 66-10 % PO SOLN
ORAL | Status: AC
  Filled 2020-03-01: qty 90

## 2020-03-01 MED ORDER — FOLIC ACID 5 MG/ML IJ SOLN
1.0000 mg | Freq: Every day | INTRAMUSCULAR | Status: AC
  Administered 2020-03-01 – 2020-03-04 (×4): 1 mg via INTRAVENOUS
  Filled 2020-03-01 (×4): qty 0.2

## 2020-03-01 NOTE — Progress Notes (Addendum)
PROGRESS NOTE        PATIENT DETAILS Name: Samantha Huerta Age: 74 y.o. Sex: female Date of Birth: Jan 09, 1946 Admit Date: 02/28/2020 Admitting Physician Samantha Shorter [name], MD Samantha Huerta, Samantha Bo, FNP  Brief Narrative: Patient is a 74 y.o. female with PMHx of COPD, HTN, glaucoma, chronic back pain, tobacco use, alcohol abuse-presented with 5-day history of abdominal pain, vomiting.  She was found to have hypovolemic shock, AKI and high-grade bowel obstruction.  She was kept n.p.o.-NGT was placed-IV fluid resuscitation was started-she was subsequently admitted to the ICU.  Even with IV fluid resuscitation-she remained hypotensive-hence right IJ central venous access was obtained-and patient was started on pressors.  See below for further details.  Significant events: 12/20>> admit for hypovolemic shock, AKI in the setting of bowel obstruction. 12/20>> persistently hypotensive in spite of IVF resuscitation-right IJ placed by surgery-started on Levophed  Significant studies: 12/20>> CT abdomen/pelvis: High-grade SBO related to adhesions in the proximal ileum 12/20>> chest x-ray: No pneumonia (personally reviewed) 12/21- abd xray with SBO  Antimicrobial therapy: Vancomycin: 12/20>> 12/21 Zosyn: 12/20>> 12/21 Ceftriaxone: 12/20 x 1 in ED Flagyl: 12/20 x 1 in ED  Microbiology data: 12/20>> blood cultures: Pending 03/01/20 Repeat Blood cx pending  Procedures : 12/20>> right IJ central venous catheter placed by Dr. Henreitta Huerta  Consults: General surgery  DVT Prophylaxis : heparin injection 5,000 Units Start: 02/28/20 1400  Subjective:  -No further emesis, -Denies significant abdominal pain -Confusion and delirium tremors concerns persist -  Assessment/Plan: Hypovolemic shock: Due to severe dehydration/vomiting-BP improving after initiation of Levophed-    Continue IVF- --nursing staff to attempt to titrate down Levophed to off.  Random cortisol  >100!  Doubt sepsis physiology -Empiric Vanco and Zosyn discontinued on 02/29/2020 -Repeat blood cultures requested on 03/01/20 due to worsening leukocytosis and persistent hypotension  AKI: Hemodynamically mediated due to vomiting and lisinopril use.  UA without proteinuria, CT abdomen without hydronephrosis. -AKI aspiration -Creatinine is down to 0.62 from 2.0 on admission -renally adjust medications, avoid nephrotoxic agents / dehydration  / hypotension -Discontinue Foley catheter  Hypokalemia: Due to GI loss-NG tube suctioning-continue to replete.    Hyponatremia: Mild: Due to hypovolemia-continue gentle IV fluid hydration- -sodium is up to 131 from 129  SBO: Remains n.p.o.-NG tube in place-no BM or flatus yet-await further input from general surgery. May Need   COPD: Stable-continue bronchodilators.  Glaucoma: Continue eyedrops.  Tobacco abuse: Counseled-declines transdermal nicotine.  Alcohol abuse: Drinks significant amount of vodka daily-claims last drink was on 12/18- --patient with delirium tremens, agitation and confusion --CIWA protocol. -Continue IV Precedex drip  Chronic back pain: On as needed narcotics  CRITICAL CARE Performed by: Samantha Huerta   Total critical care time: 43 minutes  Critical care time was exclusive of separately billable procedures and treating other patients. --Persistent hemodynamic instability and hypotension requiring IV Levophed drip continuously --- Delirium tremens with persistent agitation and DTs requiring IV Precedex drip continuously  Critical care was necessary to treat or prevent imminent or life-threatening deterioration.  Critical care was time spent personally by me on the following activities: development of treatment plan with patient and/or surrogate as well as nursing, discussions with consultants, evaluation of patient's response to treatment, examination of patient, obtaining history from patient or surrogate,  ordering and performing treatments and interventions, ordering and review of laboratory studies, ordering and review  of radiographic studies, pulse oximetry and re-evaluation of patient's condition.   Diet: Diet Order            Diet NPO time specified  Diet effective now                  Code Status: Full code   Family Communication: Daughter-Samantha Huerta-[phone]-on 12/20-left voicemail on 12./21  Disposition Plan: Status is: Inpatient  Remains inpatient appropriate because:Inpatient level of care appropriate due to severity of illness   Dispo: The patient is from: Home              Anticipated d/c is to: Home              Anticipated d/c date is: 3 days              Patient currently is not medically stable to d/c.   Barriers to Discharge: SBO-AKI-Hypovolumic shock on Levophed-remains NPO  Antimicrobial agents: Anti-infectives (From admission, onward)   Start     Dose/Rate Route Frequency Ordered Stop   02/29/20 2200  vancomycin (VANCOREADY) IVPB 500 mg/100 mL  Status:  Discontinued        500 mg 100 mL/hr over 60 Minutes Intravenous Every 24 hours 02/29/20 0851 02/29/20 1001   02/29/20 1400  piperacillin-tazobactam (ZOSYN) IVPB 3.375 g  Status:  Discontinued        3.375 g 12.5 mL/hr over 240 Minutes Intravenous Every 8 hours 02/29/20 0855 02/29/20 1001   02/28/20 1900  vancomycin (VANCOCIN) IVPB 1000 mg/200 mL premix        1,000 mg 200 mL/hr over 60 Minutes Intravenous  Once 02/28/20 1825 02/29/20 0729   02/28/20 1845  piperacillin-tazobactam (ZOSYN) IVPB 3.375 g  Status:  Discontinued        3.375 g 12.5 mL/hr over 240 Minutes Intravenous Every 12 hours 02/28/20 1831 02/29/20 0855   02/28/20 1830  piperacillin-tazobactam (ZOSYN) IVPB 3.375 g  Status:  Discontinued        3.375 g 100 mL/hr over 30 Minutes Intravenous Every 12 hours 02/28/20 1825 02/28/20 1831   02/28/20 1825  vancomycin variable dose per unstable renal function (pharmacist dosing)  Status:   Discontinued         Does not apply See admin instructions 02/28/20 1825 02/29/20 0851   02/28/20 0930  cefTRIAXone (ROCEPHIN) 2 g in sodium chloride 0.9 % 100 mL IVPB        2 g 200 mL/hr over 30 Minutes Intravenous  Once 02/28/20 0920 02/28/20 1017   02/28/20 0930  metroNIDAZOLE (FLAGYL) IVPB 500 mg        500 mg 100 mL/hr over 60 Minutes Intravenous  Once 02/28/20 0920 02/28/20 1057       Time spent: 35 minutes-Greater than 50% of this time was spent in counseling, explanation of diagnosis, planning of further management, and coordination of care.  MEDICATIONS: Scheduled Meds: . arformoterol  15 mcg Nebulization BID  . brinzolamide  1 drop Both Eyes BID  . Chlorhexidine Gluconate Cloth  6 each Topical Daily  . Chlorhexidine Gluconate Cloth  6 each Topical Q0600  . folic acid  1 mg Intravenous Daily  . heparin  5,000 Units Subcutaneous Q8H  . latanoprost  1 drop Both Eyes QHS  . multivitamin with minerals  1 tablet Oral Daily  . mupirocin ointment  1 application Nasal BID  . pantoprazole (PROTONIX) IV  40 mg Intravenous Q12H  . potassium chloride  40 mEq Oral Q3H  .  thiamine  100 mg Oral Daily   Or  . thiamine  100 mg Intravenous Daily  . umeclidinium bromide  1 puff Inhalation Daily   Continuous Infusions: . dexmedetomidine (PRECEDEX) IV infusion 0.8 mcg/kg/hr (03/01/20 0126)  . lactated ringers 75 mL/hr at 03/01/20 0942  . norepinephrine (LEVOPHED) Adult infusion 5 mcg/min (03/01/20 1036)  . potassium chloride 10 mEq (03/01/20 1130)  . sodium chloride 250 mL (02/28/20 1648)   PRN Meds:.acetaminophen **OR** acetaminophen, albuterol, LORazepam **OR** LORazepam, morphine injection, ondansetron **OR** ondansetron (ZOFRAN) IV, sodium chloride   PHYSICAL EXAM: Vital signs: Vitals:   03/01/20 0538 03/01/20 0710 03/01/20 0717 03/01/20 1115  BP:      Pulse:  61  (!) 103  Resp:  17  15  Temp:  97.7 F (36.5 C)  (!) 97.3 F (36.3 C)  TempSrc:  Axillary  Axillary  SpO2:  100% 100% 100% 100%  Weight:      Height:       Filed Weights   02/28/20 1455 02/29/20 0506 03/01/20 0447  Weight: 44.9 kg 46.3 kg 46 kg   Body mass index is 16.88 kg/m.   Gen Exam:Alert awake-not in any distress HEENT:atraumatic, normocephalic Nose - NG tube Neck -- Rt IJ central line Chest: Diminished in bases, no wheezing CVS:S1S2 regular Abdomen:soft , diminished bowel sounds, not significantly distended, not significantly tender Extremities:no edema, pedal pulses noted Neurology: Non focal Skin: no rash, warm/dry GU- foley in situ-- to be removed  I have personally reviewed following labs and imaging studies  LABORATORY DATA: CBC: Recent Labs  Lab 02/28/20 0913 02/28/20 2035 02/29/20 0429 03/01/20 0430  WBC 4.4 5.5 5.3 17.1*  NEUTROABS 3.0  --   --   --   HGB 15.2* 13.9 13.7 12.7  HCT 44.6 40.5 39.3 37.1  MCV 100.0 97.4 96.6 97.9  PLT 522* 514* 498* 398    Basic Metabolic Panel: Recent Labs  Lab 02/28/20 0913 02/28/20 0957 02/28/20 2035 02/29/20 0429 02/29/20 1820 03/01/20 0430  NA 129*  --  129* 129*  --  131*  K 3.2*  --  3.9 3.4*  --  3.0*  CL 86*  --  90* 91*  --  92*  CO2 20*  --  24 26  --  28  GLUCOSE 113*  --  111* 141*  --  91  BUN 99*  --  86* 77*  --  41*  CREATININE 2.02*  --  1.73* 1.31* 0.76 0.62  CALCIUM 9.6  --  8.5* 8.3*  --  8.4*  MG  --  2.1 2.2 2.0  --   --   PHOS  --   --  5.0*  --   --   --     GFR: Estimated Creatinine Clearance: 44.1 mL/min (by C-G formula based on SCr of 0.62 mg/dL).  Liver Function Tests: Recent Labs  Lab 02/28/20 0913 02/28/20 2035 02/29/20 0429 03/01/20 0430  AST ALT ALKPHOS 51 35* 33* 41  BILITOT 1.4* 0.8 0.6 1.0  PROT 6.9 5.5* 5.6* 5.3*  ALBUMIN 4.1 3.0* 3.1* 2.6*   Recent Labs  Lab 02/28/20 0913  LIPASE 36   No results for input(s): AMMONIA in the last 168 hours.  Coagulation Profile: Recent Labs  Lab 02/28/20 0913  INR 0.9    Cardiac Enzymes: No  results for input(s): CKTOTAL, CKMB, CKMBINDEX, TROPONINI in the last 168 hours.  BNP (last 3 results) No results  for input(s): PROBNP in the last 8760 hours.  Lipid Profile: No results for input(s): CHOL, HDL, LDLCALC, TRIG, CHOLHDL, LDLDIRECT in the last 72 hours.  Thyroid Function Tests: No results for input(s): TSH, T4TOTAL, FREET4, T3FREE, THYROIDAB in the last 72 hours.  Anemia Panel: No results for input(s): VITAMINB12, FOLATE, FERRITIN, TIBC, IRON, RETICCTPCT in the last 72 hours.  Urine analysis:    Component Value Date/Time   COLORURINE AMBER (A) 02/28/2020 2200   APPEARANCEUR HAZY (A) 02/28/2020 2200   APPEARANCEUR Clear 03/20/2017 1112   LABSPEC 1.021 02/28/2020 2200   PHURINE 5.0 02/28/2020 2200   GLUCOSEU NEGATIVE 02/28/2020 2200   HGBUR NEGATIVE 02/28/2020 2200   BILIRUBINUR SMALL (A) 02/28/2020 2200   BILIRUBINUR Negative 03/20/2017 1112   KETONESUR 5 (A) 02/28/2020 2200   PROTEINUR NEGATIVE 02/28/2020 2200   UROBILINOGEN negative 01/25/2014 1529   NITRITE NEGATIVE 02/28/2020 2200   LEUKOCYTESUR NEGATIVE 02/28/2020 2200   Sepsis Labs: Lactic Acid, Venous    Component Value Date/Time   LATICACIDVEN 1.7 02/29/2020 0429    MICROBIOLOGY: Recent Results (from the past 240 hour(s))  Urine culture     Status: None   Collection Time: 02/28/20  9:13 AM   Specimen: In/Out Cath Urine  Result Value Ref Range Status   Specimen Description   Final    IN/OUT CATH URINE Performed at [facility], [address] [place], [place] [address]    Special Requests   Final    NONE Performed at [facility], [address] [place], [place] [address]    Culture   Final    NO GROWTH Performed at [facility], [address] [place], [place] [address]    Report Status 02/29/2020 FINAL  Final  Blood Culture (routine x 2)     Status: None (Preliminary result)   Collection Time: 02/28/20  9:14 AM   Specimen: BLOOD  Result Value Ref Range Status   Specimen  Description BLOOD RIGHT ANTECUBITAL  Final   Special Requests   Final    BOTTLES DRAWN AEROBIC AND ANAEROBIC Blood Culture results may not be optimal due to an inadequate volume of blood received in culture bottles   Culture   Final    NO GROWTH 2 DAYS Performed at [facility], [address] [place], [place] [address]    Report Status PENDING  Incomplete  Resp Panel by RT-PCR (Flu A&B, Covid) Nasopharyngeal Swab     Status: None   Collection Time: 02/28/20  9:20 AM   Specimen: Nasopharyngeal Swab; Nasopharyngeal(NP) swabs in vial transport medium  Result Value Ref Range Status   SARS Coronavirus 2 by RT PCR NEGATIVE NEGATIVE Final    Comment: (NOTE) SARS-CoV-2 target nucleic acids are NOT DETECTED.  The SARS-CoV-2 RNA is generally detectable in upper respiratory specimens during the acute phase of infection. The lowest concentration of SARS-CoV-2 viral copies this assay can detect is 138 copies/mL. A negative result does not preclude SARS-Cov-2 infection and should not be used as the sole basis for treatment or other patient management decisions. A negative result may occur with  improper specimen collection/handling, submission of specimen other than nasopharyngeal swab, presence of viral mutation(s) within the areas targeted by this assay, and inadequate number of viral copies(<138 copies/mL). A negative result must be combined with clinical observations, patient history, and epidemiological information. The expected result is Negative.  Fact Sheet for Patients:  BloggerCourse.com  Fact Sheet for Healthcare Providers:  SeriousBroker.it  This test is no t  yet approved or cleared by the [place] and  has been authorized for detection and/or diagnosis of SARS-CoV-2 by FDA under an Emergency Use Authorization (EUA). This EUA will remain  in effect (meaning this test can be used) for the duration of the COVID-19  declaration under Section 564(b)(1) of the Act, 21 U.S.C.section 360bbb-3(b)(1), unless the authorization is terminated  or revoked sooner.       Influenza A by PCR NEGATIVE NEGATIVE Final   Influenza B by PCR NEGATIVE NEGATIVE Final    Comment: (NOTE) The Xpert Xpress SARS-CoV-2/FLU/RSV plus assay is intended as an aid in the diagnosis of influenza from Nasopharyngeal swab specimens and should not be used as a sole basis for treatment. Nasal washings and aspirates are unacceptable for Xpert Xpress SARS-CoV-2/FLU/RSV testing.  Fact Sheet for Patients: BloggerCourse.com  Fact Sheet for Healthcare Providers: SeriousBroker.it  This test is not yet approved or cleared by the [place] FDA and has been authorized for detection and/or diagnosis of SARS-CoV-2 by FDA under an Emergency Use Authorization (EUA). This EUA will remain in effect (meaning this test can be used) for the duration of the COVID-19 declaration under Section 564(b)(1) of the Act, 21 U.S.C. section 360bbb-3(b)(1), unless the authorization is terminated or revoked.  Performed at [facility], [address] [place], [place] [address]   Blood Culture (routine x 2)     Status: None (Preliminary result)   Collection Time: 02/28/20  9:57 AM   Specimen: BLOOD LEFT FOREARM  Result Value Ref Range Status   Specimen Description BLOOD LEFT FOREARM  Final   Special Requests   Final    BOTTLES DRAWN AEROBIC AND ANAEROBIC Blood Culture results may not be optimal due to an inadequate volume of blood received in culture bottles   Culture   Final    NO GROWTH 2 DAYS Performed at [facility], [address], [place], [place] [address]    Report Status PENDING  Incomplete  MRSA PCR Screening     Status: Abnormal   Collection Time: 02/28/20  2:35 PM   Specimen: Nasopharyngeal  Result Value Ref Range Status   MRSA by PCR POSITIVE (A) NEGATIVE Final    Comment:        The  GeneXpert MRSA Assay (FDA approved for NASAL specimens only), is one component of a comprehensive MRSA colonization surveillance program. It is not intended to diagnose MRSA infection nor to guide or monitor treatment for MRSA infections. RESULT CALLED TO, READ BACK BY AND VERIFIED WITH: SHELTON,A AT 1632 BY HUFFINES,S ON 02/28/20. Performed at [facility], [address], [place], [place] [address]     RADIOLOGY STUDIES/RESULTS: DG Abd 1 View  Result Date: 03/01/2020 CLINICAL DATA:  Small bowel obstruction EXAM: ABDOMEN - 1 VIEW COMPARISON:  CT 2 days ago FINDINGS: Ongoing small bowel obstruction with dilated loops in the central abdomen measuring up to 3.8 cm. The degree of distension is improved, but no new colonic gas is seen. No gross pneumoperitoneum. Clear lung bases. IMPRESSION: Ongoing small bowel obstruction with improved small bowel distension. Electronically Signed   By: Marnee Spring M.D.   On: 03/01/2020 07:44   DG Chest Port 1 View  Result Date: 02/28/2020 CLINICAL DATA:  74 year old female status post central line placement. EXAM: PORTABLE CHEST 1 VIEW COMPARISON:  Chest radiograph dated 04/05/2016 and CT dated 07/08/2019. FINDINGS: Right IJ central venous line with tip over central SVC. Enteric tube with tip in the distal stomach. There is background  of emphysema. No focal consolidation, pleural effusion or pneumothorax. The cardiac silhouette is within limits. Atherosclerotic calcification of the aorta. Osteopenia with degenerative changes of the spine and shoulders. Old healed right hip fractures. No acute osseous pathology. IMPRESSION: Right IJ central venous line with tip over central SVC. Electronically Signed   By: Elgie CollardArash  Radparvar M.D.   On: 02/28/2020 18:46   US EKG SITE RITE  Result Date: 02/28/2020 If Site Rite image not attached, placement could not be confirmed due to current cardiac rhythm.   LOS: 2 days   Samantha Haleourage Darcel Frane, MD  [facility]  To  contact the attending provider between 7A-7P or the covering provider during after hours 7P-7A, please log into the web site [email or web address] and access using universal [place] password for that web site. If you do not have the password, please call the hospital operator.  03/01/2020, 11:41 AM

## 2020-03-01 NOTE — Progress Notes (Signed)
X-ray with improvement. Looks like some contrast into the colon.  Notified [name], daughter.  Algis Greenhouse, MD

## 2020-03-01 NOTE — Progress Notes (Signed)
[facility] Progress Note     Subjective: Patient with SBO that is improving with less distention. More calm now with precedex. Remains on a small dose levophed. Leukocytosis up for unknown etiology.    Says her abdomen is not hurting and has been having flatus she thinks. Ng output remains fairly high.    Objective: Vital signs in last 24 hours: Temp:  [97.2 F (36.2 C)-97.9 F (36.6 C)] 97.3 F (36.3 C) (12/22 1115) Pulse Rate:  [61-112] 79 (12/22 1300) Resp:  [14-25] 22 (12/22 1300) BP: (83-133)/(49-102) 87/54 (12/22 1300) SpO2:  [100 %] 100 % (12/22 1300) Weight:  [46 kg] 46 kg (12/22 0447) Last BM Date: 02/23/20  Intake/Output from previous day: 12/21 0701 - 12/22 0700 In: 4254.7 [I.V.:3931.1; IV Piggyback:323.6] Out: 2825 [Urine:825; Emesis/NG output:2000] Intake/Output this shift: Total I/O In: 858.6 [I.V.:858.6] Out: 1275 [Urine:275; Emesis/NG output:1000]  General appearance: alert and no distress Resp: normal work of breathing GI: soft, minimally distended, nontender  Lab Results:  Recent Labs    02/29/20 0429 03/01/20 0430  WBC 5.3 17.1*  HGB 13.7 12.7  HCT 39.3 37.1  PLT 498* 398   BMET Recent Labs    02/29/20 0429 02/29/20 1820 03/01/20 0430  NA 129*  --  131*  K 3.4*  --  3.0*  CL 91*  --  92*  CO2 26  --  28  GLUCOSE 141*  --  91  BUN 77*  --  41*  CREATININE 1.31* 0.76 0.62  CALCIUM 8.3*  --  8.4*   PT/INR Recent Labs    02/28/20 0913  LABPROT 12.2  INR 0.9    Studies/Results: DG Abd 1 View  Result Date: 03/01/2020 CLINICAL DATA:  Small bowel obstruction EXAM: ABDOMEN - 1 VIEW COMPARISON:  CT 2 days ago FINDINGS: Ongoing small bowel obstruction with dilated loops in the central abdomen measuring up to 3.8 cm. The degree of distension is improved, but no new colonic gas is seen. No gross pneumoperitoneum. Clear lung bases. IMPRESSION: Ongoing small bowel obstruction with improved small bowel distension.  Electronically Signed   By: Marnee Spring M.D.   On: 03/01/2020 07:44   DG Chest Port 1 View  Result Date: 02/28/2020 CLINICAL DATA:  74 year old female status post central line placement. EXAM: PORTABLE CHEST 1 VIEW COMPARISON:  Chest radiograph dated 04/05/2016 and CT dated 07/08/2019. FINDINGS: Right IJ central venous line with tip over central SVC. Enteric tube with tip in the distal stomach. There is background of emphysema. No focal consolidation, pleural effusion or pneumothorax. The cardiac silhouette is within limits. Atherosclerotic calcification of the aorta. Osteopenia with degenerative changes of the spine and shoulders. Old healed right hip fractures. No acute osseous pathology. IMPRESSION: Right IJ central venous line with tip over central SVC. Electronically Signed   By: Elgie Collard M.D.   On: 02/28/2020 18:46   [place] EKG SITE RITE  Result Date: 02/28/2020 If Site Rite image not attached, placement could not be confirmed due to current cardiac rhythm.   Anti-infectives: Anti-infectives (From admission, onward)   Start     Dose/Rate Route Frequency Ordered Stop   02/29/20 2200  vancomycin (VANCOREADY) IVPB 500 mg/100 mL  Status:  Discontinued        500 mg 100 mL/hr over 60 Minutes Intravenous Every 24 hours 02/29/20 0851 02/29/20 1001   02/29/20 1400  piperacillin-tazobactam (ZOSYN) IVPB 3.375 g  Status:  Discontinued        3.375 g 12.5 mL/hr  over 240 Minutes Intravenous Every 8 hours 02/29/20 0855 02/29/20 1001   02/28/20 1900  vancomycin (VANCOCIN) IVPB 1000 mg/200 mL premix        1,000 mg 200 mL/hr over 60 Minutes Intravenous  Once 02/28/20 1825 02/29/20 0729   02/28/20 1845  piperacillin-tazobactam (ZOSYN) IVPB 3.375 g  Status:  Discontinued        3.375 g 12.5 mL/hr over 240 Minutes Intravenous Every 12 hours 02/28/20 1831 02/29/20 0855   02/28/20 1830  piperacillin-tazobactam (ZOSYN) IVPB 3.375 g  Status:  Discontinued        3.375 g 100 mL/hr over 30 Minutes  Intravenous Every 12 hours 02/28/20 1825 02/28/20 1831   02/28/20 1825  vancomycin variable dose per unstable renal function (pharmacist dosing)  Status:  Discontinued         Does not apply See admin instructions 02/28/20 1825 02/29/20 0851   02/28/20 0930  cefTRIAXone (ROCEPHIN) 2 g in sodium chloride 0.9 % 100 mL IVPB        2 g 200 mL/hr over 30 Minutes Intravenous  Once 02/28/20 0920 02/28/20 1017   02/28/20 0930  metroNIDAZOLE (FLAGYL) IVPB 500 mg        500 mg 100 mL/hr over 60 Minutes Intravenous  Once 02/28/20 0920 02/28/20 1057      Assessment/Plan: Ms. Templer is a 74 yo with SBO and alcohol withdrawal yesterday on precedex. Did not pursue SBFT yesterday due to this. Patient leukocytosis worsening for unknown reason, no fevers, and could be reactive. Remains on a small amount of levophed. Discussed with Dr. Mariea Clonts. Plan for SBFT today. Xray this AM with some minor improvement and confirmation of NG tube.  If continued obstruction will have to plan for surgery tomorrow.    LOS: 2 days    Lucretia Roers 03/01/2020

## 2020-03-02 ENCOUNTER — Inpatient Hospital Stay (HOSPITAL_COMMUNITY): Payer: Medicare Other

## 2020-03-02 DIAGNOSIS — K56609 Unspecified intestinal obstruction, unspecified as to partial versus complete obstruction: Secondary | ICD-10-CM | POA: Diagnosis not present

## 2020-03-02 LAB — CBC WITH DIFFERENTIAL/PLATELET
Abs Immature Granulocytes: 0.38 10*3/uL — ABNORMAL HIGH (ref 0.00–0.07)
Basophils Absolute: 0.1 10*3/uL (ref 0.0–0.1)
Basophils Relative: 0 %
Eosinophils Absolute: 0 10*3/uL (ref 0.0–0.5)
Eosinophils Relative: 0 %
HCT: 38.2 % (ref 36.0–46.0)
Hemoglobin: 12.7 g/dL (ref 12.0–15.0)
Immature Granulocytes: 2 %
Lymphocytes Relative: 5 %
Lymphs Abs: 0.9 10*3/uL (ref 0.7–4.0)
MCH: 33.1 pg (ref 26.0–34.0)
MCHC: 33.2 g/dL (ref 30.0–36.0)
MCV: 99.5 fL (ref 80.0–100.0)
Monocytes Absolute: 0.8 10*3/uL (ref 0.1–1.0)
Monocytes Relative: 5 %
Neutro Abs: 15.1 10*3/uL — ABNORMAL HIGH (ref 1.7–7.7)
Neutrophils Relative %: 88 %
Platelets: 418 10*3/uL — ABNORMAL HIGH (ref 150–400)
RBC: 3.84 MIL/uL — ABNORMAL LOW (ref 3.87–5.11)
RDW: 12.7 % (ref 11.5–15.5)
WBC: 17.2 10*3/uL — ABNORMAL HIGH (ref 4.0–10.5)
nRBC: 0 % (ref 0.0–0.2)

## 2020-03-02 LAB — COMPREHENSIVE METABOLIC PANEL
ALT: 14 U/L (ref 0–44)
AST: 30 U/L (ref 15–41)
Albumin: 2.7 g/dL — ABNORMAL LOW (ref 3.5–5.0)
Alkaline Phosphatase: 50 U/L (ref 38–126)
Anion gap: 13 (ref 5–15)
BUN: 24 mg/dL — ABNORMAL HIGH (ref 8–23)
CO2: 26 mmol/L (ref 22–32)
Calcium: 8.6 mg/dL — ABNORMAL LOW (ref 8.9–10.3)
Chloride: 95 mmol/L — ABNORMAL LOW (ref 98–111)
Creatinine, Ser: 0.69 mg/dL (ref 0.44–1.00)
GFR, Estimated: >60 mL/min (ref >60)
Glucose, Bld: 121 mg/dL — ABNORMAL HIGH (ref 70–99)
Potassium: 3.4 mmol/L — ABNORMAL LOW (ref 3.5–5.1)
Sodium: 134 mmol/L — ABNORMAL LOW (ref 135–145)
Total Bilirubin: 0.8 mg/dL (ref 0.3–1.2)
Total Protein: 5.4 g/dL — ABNORMAL LOW (ref 6.5–8.1)

## 2020-03-02 LAB — MAGNESIUM: Magnesium: 1.8 mg/dL (ref 1.7–2.4)

## 2020-03-02 MED ORDER — BISACODYL 10 MG RE SUPP
10.0000 mg | Freq: Once | RECTAL | Status: AC
  Administered 2020-03-02: 09:00:00 10 mg via RECTAL
  Filled 2020-03-02: qty 1

## 2020-03-02 MED ORDER — BISACODYL 10 MG RE SUPP: 10.0000 mg | Freq: Once | RECTAL | Status: DC

## 2020-03-02 MED ORDER — POTASSIUM CHLORIDE 10 MEQ/100ML IV SOLN
10.0000 meq | INTRAVENOUS | Status: AC
  Administered 2020-03-02 (×4): 10 meq via INTRAVENOUS
  Filled 2020-03-02 (×2): qty 100

## 2020-03-02 MED ORDER — LORAZEPAM 2 MG/ML IJ SOLN
1.0000 mg | INTRAMUSCULAR | Status: DC | PRN
  Administered 2020-03-03: 01:00:00 1 mg via INTRAVENOUS
  Filled 2020-03-02 (×2): qty 1

## 2020-03-02 MED ORDER — SODIUM CHLORIDE 0.9 % IV SOLN
3.0000 g | Freq: Four times a day (QID) | INTRAVENOUS | Status: AC
  Administered 2020-03-03 – 2020-03-07 (×20): 3 g via INTRAVENOUS
  Filled 2020-03-02 (×3): qty 8
  Filled 2020-03-02 (×2): qty 3
  Filled 2020-03-02 (×5): qty 8
  Filled 2020-03-02: qty 3
  Filled 2020-03-02 (×2): qty 8
  Filled 2020-03-02 (×3): qty 3
  Filled 2020-03-02: qty 8
  Filled 2020-03-02: qty 3
  Filled 2020-03-02: qty 8
  Filled 2020-03-02 (×2): qty 3
  Filled 2020-03-02 (×4): qty 8
  Filled 2020-03-02 (×2): qty 3

## 2020-03-02 MED ORDER — NICOTINE 14 MG/24HR TD PT24
14.0000 mg | MEDICATED_PATCH | Freq: Every day | TRANSDERMAL | Status: DC
  Administered 2020-03-02 – 2020-03-10 (×9): 14 mg via TRANSDERMAL
  Filled 2020-03-02 (×8): qty 1

## 2020-03-02 NOTE — Progress Notes (Signed)
[facility] Progress Note     Subjective: Patient still confused. No complaints of pain. Called [name] last night and Xray improved. Xray this AM about the same. Possible aspiration PNA. Continues with leukocytosis.   Objective: Vital signs in last 24 hours: Temp:  [97.3 F (36.3 C)-98.6 F (37 C)] 97.7 F (36.5 C) (12/23 0724) Pulse Rate:  [66-126] 126 (12/23 0724) Resp:  [11-47] 31 (12/23 0724) BP: (76-130)/(39-96) 109/67 (12/23 0645) SpO2:  [64 %-100 %] 100 % (12/23 0820) Weight:  [45.7 kg] 45.7 kg (12/23 0500) Last BM Date: 02/23/20  Intake/Output from previous day: 12/22 0701 - 12/23 0700 In: 2882.8 [I.V.:2482.8; IV Piggyback:400] Out: 4075 [Urine:575; Emesis/NG output:3500] Intake/Output this shift: No intake/output data recorded.  General appearance: sleeping and distressed with awakening Resp: normal work of breathing GI: soft, nondistended and nontender  Lab Results:  Recent Labs    03/01/20 0430 03/02/20 0436  WBC 17.1* 17.2*  HGB 12.7 12.7  HCT 37.1 38.2  PLT 398 418*   BMET Recent Labs    03/01/20 0430 03/02/20 0436  NA 131* 134*  K 3.0* 3.4*  CL 92* 95*  CO2 28 26  GLUCOSE 91 121*  BUN 41* 24*  CREATININE 0.62 0.69  CALCIUM 8.4* 8.6*   PT/INR No results for input(s): LABPROT, INR in the last 72 hours.  Studies/Results: DG Abd 1 View  Result Date: 03/01/2020 CLINICAL DATA:  Small bowel obstruction EXAM: ABDOMEN - 1 VIEW COMPARISON:  CT 2 days ago FINDINGS: Ongoing small bowel obstruction with dilated loops in the central abdomen measuring up to 3.8 cm. The degree of distension is improved, but no new colonic gas is seen. No gross pneumoperitoneum. Clear lung bases. IMPRESSION: Ongoing small bowel obstruction with improved small bowel distension. Electronically Signed   By: Marnee Spring M.D.   On: 03/01/2020 07:44   DG ABD ACUTE 2+V W 1V CHEST  Result Date: 03/02/2020 CLINICAL DATA:  Small-bowel obstruction. EXAM: DG  ABDOMEN ACUTE WITH 1 VIEW CHEST COMPARISON:  March 01, 2020. FINDINGS: Mild left basilar opacities with partial silhouetting of the left hemidiaphragm. No visible pleural effusions or pneumothorax. Stable cardiomediastinal silhouette. Enteric tube courses below the diaphragm with the tip in the distal stomach/peripyloric region, unchanged. Right central venous catheter with the tip projecting in the region of the superior cavoatrial junction. Slight increase in small bowel dilation with small bowel measuring up to 4 cm in the left lower abdomen. No other interval change. No gross pneumoperitoneum on this supine radiograph. Surgical clips projecting over the lower abdomen/pelvis. Polyarticular degenerative change. IMPRESSION: 1. Slight increase in small bowel dilation. 2. Mild left basilar opacities, which may represent atelectasis, aspiration, and/or pneumonia. Electronically Signed   By: Feliberto Harts MD   On: 03/02/2020 08:20   DG Abd Portable 1V-Small Bowel Obstruction Protocol-initial, 8 hr delay  Result Date: 03/01/2020 CLINICAL DATA:  74 year old female with small bowel obstruction. 8 hour delayed image. EXAM: PORTABLE ABDOMEN - 1 VIEW COMPARISON:  CT of the abdomen pelvis dated 02/28/2020 abdominal radiograph dated 03/01/2020 FINDINGS: Enteric tube with tip in the distal stomach. Improved dilatation of the small bowel compared to the prior radiograph now measuring up to 3 cm in the left lower abdomen. No other interval change. IMPRESSION: Improved dilatation of the small bowel compared to prior radiograph. Continued follow-up recommended. Electronically Signed   By: Elgie Collard M.D.   On: 03/01/2020 22:29    Anti-infectives: Anti-infectives (From admission, onward)   Start  Dose/Rate Route Frequency Ordered Stop   02/29/20 2200  vancomycin (VANCOREADY) IVPB 500 mg/100 mL  Status:  Discontinued        500 mg 100 mL/hr over 60 Minutes Intravenous Every 24 hours 02/29/20 0851 02/29/20  1001   02/29/20 1400  piperacillin-tazobactam (ZOSYN) IVPB 3.375 g  Status:  Discontinued        3.375 g 12.5 mL/hr over 240 Minutes Intravenous Every 8 hours 02/29/20 0855 02/29/20 1001   02/28/20 1900  vancomycin (VANCOCIN) IVPB 1000 mg/200 mL premix        1,000 mg 200 mL/hr over 60 Minutes Intravenous  Once 02/28/20 1825 02/29/20 0729   02/28/20 1845  piperacillin-tazobactam (ZOSYN) IVPB 3.375 g  Status:  Discontinued        3.375 g 12.5 mL/hr over 240 Minutes Intravenous Every 12 hours 02/28/20 1831 02/29/20 0855   02/28/20 1830  piperacillin-tazobactam (ZOSYN) IVPB 3.375 g  Status:  Discontinued        3.375 g 100 mL/hr over 30 Minutes Intravenous Every 12 hours 02/28/20 1825 02/28/20 1831   02/28/20 1825  vancomycin variable dose per unstable renal function (pharmacist dosing)  Status:  Discontinued         Does not apply See admin instructions 02/28/20 1825 02/29/20 0851   02/28/20 0930  cefTRIAXone (ROCEPHIN) 2 g in sodium chloride 0.9 % 100 mL IVPB        2 g 200 mL/hr over 30 Minutes Intravenous  Once 02/28/20 0920 02/28/20 1017   02/28/20 0930  metroNIDAZOLE (FLAGYL) IVPB 500 mg        500 mg 100 mL/hr over 60 Minutes Intravenous  Once 02/28/20 0920 02/28/20 1057      Assessment/Plan: Samantha Huerta is a 74 yo with greatly improved distention. Abdomen is benign now. Xray is improved after SBFT.  NG output is high but NG is very deep prob distal stomach to almost duodenum. RN pulled back 8cm this AM.  Leukocytosis likely from aspiration PNA No plans for surgical intervention for SBO at this time, Xray about the same which was improve last night, and I see contrast in colon Suppository NG and NPO for now until bowel function   Updated Samantha Huerta again this Am. She is traveling back to [facility] today and will be back after Xmas.  Discussed with RN and Dr. Mariea Clonts.   LOS: 3 days    Samantha Huerta 03/02/2020

## 2020-03-02 NOTE — Progress Notes (Deleted)
Notified MD Robb Matar of pt bladder scan results: 373 mL. Awaiting MD orders

## 2020-03-02 NOTE — Progress Notes (Signed)
Notified MD Robb Matar of pt bladder scan results: 373 mL of urine. Awaiting MD orders

## 2020-03-02 NOTE — Progress Notes (Signed)
CRITICAL VALUE ALERT  Critical Value:  Notified MD of pt CIWA score   Date & Time Notied:  03/01/20 & 22:00   Provider Notified: Dr. Henreitta Leber  Orders Received/Actions taken: No new orders, follow CIWA protocol

## 2020-03-02 NOTE — Progress Notes (Signed)
[facility] night shift coverage.  The staff reported that the patient had a recurrence of urinary retention with 373 mL measured on bladder scan. Foley cath placement ordered given recurrence and poor clinical condition of the patient.  Sanda Klein, MD

## 2020-03-02 NOTE — Progress Notes (Signed)
PROGRESS NOTE        PATIENT DETAILS Name: Samantha Huerta Age: 74 y.o. Sex: female Date of Birth: 01/26/1946 Admit Date: 02/28/2020 Admitting Physician [name] [name], MD SEG:BTDVV, [name], FNP  Brief Narrative: Patient is a 74 y.o. female with PMHx of COPD, HTN, glaucoma, chronic back pain, tobacco use, alcohol abuse-presented with 5-day history of abdominal pain, vomiting.  She was found to have hypovolemic shock, AKI and high-grade bowel obstruction.  She was kept n.p.o.-NGT was placed-IV fluid resuscitation was started-she was subsequently admitted to the ICU.  Even with IV fluid resuscitation-she remained hypotensive-hence right IJ central venous access was obtained-and patient was started on pressors.  See below for further details.  Significant events: 12/20>> admit for hypovolemic shock, AKI in the setting of bowel obstruction. 12/20>> persistently hypotensive in spite of IVF resuscitation-right IJ placed by surgery-started on Levophed  Significant studies: 12/20>> CT abdomen/pelvis: High-grade SBO related to adhesions in the proximal ileum 12/20>> chest x-ray: No pneumonia (personally reviewed) 12/21- abd xray with SBO  Antimicrobial therapy: Vancomycin: 12/20>> 12/21 Zosyn: 12/20>> 12/21 Ceftriaxone: 12/20 x 1 in ED Flagyl: 12/20 x 1 in ED  Microbiology data: 12/20>> blood cultures: Pending 03/01/20 Repeat Blood cx pending  Procedures : 12/20>> right IJ central venous catheter placed by Dr. Henreitta Leber  Consults: General surgery  DVT Prophylaxis : heparin injection 5,000 Units Start: 02/28/20 1400  Subjective:  Alcohol withdrawal symptoms/delirium tremens-worsened  No BM, no flatus Voiding difficulties persist -  Assessment/Plan: Hypovolemic shock: Due to severe dehydration/vomiting-BP improving after initiation of Levophed-    Continue IVF- --nursing staff to attempt to titrate down Levophed to off.  Random cortisol >100!   Doubt sepsis physiology -Empiric Vanco and Zosyn discontinued on 02/29/2020 -Repeat blood cultures requested on 03/01/20 due to worsening leukocytosis and persistent hypotension  -Presumed Aspiration Pneumonia--leukocytosis noted, Unasyn as ordered  AKI: Hemodynamically mediated due to vomiting and lisinopril use.  UA without proteinuria, CT abdomen without hydronephrosis. -Creatinine is down to 0.69 from 2.0 on admission -renally adjust medications, avoid nephrotoxic agents / dehydration  / hypotension  -Urinary retention--patient failed voiding trial on 03/01/2020,  foley in situ--reinserted 03/02/2020 due to urinary retention  Hypokalemia: Due to GI loss-NG tube suctioning-continue to replete.    Hyponatremia: Mild: Due to hypovolemia-continue gentle IV fluid hydration- -sodium is up to 134 from 129  SBO: Remains n.p.o.-NG tube in place-no BM or flatus yet- -Small bowel follow-through on 03/01/2020 and subsequent serial x-rays demonstrates persistent SBO, not worse,  -General surgeon Dr. Henreitta Leber recommends conservative management, hold off on operative intervention at this time  COPD: Stable-continue bronchodilators.  Glaucoma: Continue eyedrops.  Tobacco abuse:use transdermal nicotine.  Alcohol abuse/delirium tremens: --Alcohol withdrawal symptoms/delirium tremens-worsened  -We will increase Precedex drip to 1.4, okay to use as needed Ativan PTA pt drank significant amount of vodka daily-claims last drink was on 12/18- --patient with delirium tremens, agitation and confusion --CIWA protocol. - Chronic back pain: On as needed narcotics  CRITICAL CARE Performed by: Shon Hale   Total critical care time: 41 minutes  Critical care time was exclusive of separately billable procedures and treating other patients. --Persistent hemodynamic instability and hypotension requiring IV Levophed drip continuously --- Delirium tremens with persistent agitation and DTs requiring  IV Precedex drip continuously  Critical care was necessary to treat or prevent imminent or life-threatening deterioration.  Critical care was time spent personally by me on the following activities: development of treatment plan with patient and/or surrogate as well as nursing, discussions with consultants, evaluation of patient's response to treatment, examination of patient, obtaining history from patient or surrogate, ordering and performing treatments and interventions, ordering and review of laboratory studies, ordering and review of radiographic studies, pulse oximetry and re-evaluation of patient's condition.   Diet: Diet Order            Diet NPO time specified  Diet effective now                  Code Status: Full code   Family Communication: Daughter-[name]-on 12/20-left voicemail on 12./21  Disposition Plan: Status is: Inpatient  Remains inpatient appropriate because:Inpatient level of care appropriate due to severity of illness   Dispo: The patient is from: Home              Anticipated d/c is to: Home              Anticipated d/c date is: 3 days              Patient currently is not medically stable to d/c.   Barriers to Discharge: SBO-AKI-Hypovolumic shock on Levophed-remains NPO  Antimicrobial agents: Anti-infectives (From admission, onward)   Start     Dose/Rate Route Frequency Ordered Stop   02/29/20 2200  vancomycin (VANCOREADY) IVPB 500 mg/100 mL  Status:  Discontinued        500 mg 100 mL/hr over 60 Minutes Intravenous Every 24 hours 02/29/20 0851 02/29/20 1001   02/29/20 1400  piperacillin-tazobactam (ZOSYN) IVPB 3.375 g  Status:  Discontinued        3.375 g 12.5 mL/hr over 240 Minutes Intravenous Every 8 hours 02/29/20 0855 02/29/20 1001   02/28/20 1900  vancomycin (VANCOCIN) IVPB 1000 mg/200 mL premix        1,000 mg 200 mL/hr over 60 Minutes Intravenous  Once 02/28/20 1825 02/29/20 0729   02/28/20 1845   piperacillin-tazobactam (ZOSYN) IVPB 3.375 g  Status:  Discontinued        3.375 g 12.5 mL/hr over 240 Minutes Intravenous Every 12 hours 02/28/20 1831 02/29/20 0855   02/28/20 1830  piperacillin-tazobactam (ZOSYN) IVPB 3.375 g  Status:  Discontinued        3.375 g 100 mL/hr over 30 Minutes Intravenous Every 12 hours 02/28/20 1825 02/28/20 1831   02/28/20 1825  vancomycin variable dose per unstable renal function (pharmacist dosing)  Status:  Discontinued         Does not apply See admin instructions 02/28/20 1825 02/29/20 0851   02/28/20 0930  cefTRIAXone (ROCEPHIN) 2 g in sodium chloride 0.9 % 100 mL IVPB        2 g 200 mL/hr over 30 Minutes Intravenous  Once 02/28/20 0920 02/28/20 1017   02/28/20 0930  metroNIDAZOLE (FLAGYL) IVPB 500 mg        500 mg 100 mL/hr over 60 Minutes Intravenous  Once 02/28/20 0920 02/28/20 1057       Time spent: 35 minutes-Greater than 50% of this time was spent in counseling, explanation of diagnosis, planning of further management, and coordination of care.  MEDICATIONS: Scheduled Meds: . arformoterol  15 mcg Nebulization BID  . bisacodyl  10 mg Rectal Once  . brinzolamide  1 drop Both Eyes BID  . chlorhexidine  15 mL Mouth Rinse BID  . Chlorhexidine Gluconate Cloth  6 each Topical Daily  .  Chlorhexidine Gluconate Cloth  6 each Topical Q0600  . folic acid  1 mg Intravenous Daily  . heparin  5,000 Units Subcutaneous Q8H  . latanoprost  1 drop Both Eyes QHS  . mouth rinse  15 mL Mouth Rinse q12n4p  . multivitamin with minerals  1 tablet Oral Daily  . mupirocin ointment  1 application Nasal BID  . pantoprazole (PROTONIX) IV  40 mg Intravenous Q12H  . thiamine  100 mg Oral Daily   Or  . thiamine  100 mg Intravenous Daily  . umeclidinium bromide  1 puff Inhalation Daily   Continuous Infusions: . dexmedetomidine (PRECEDEX) IV infusion 1.2 mcg/kg/hr (03/02/20 1603)  . lactated ringers Stopped (03/01/20 2229)  . norepinephrine (LEVOPHED) Adult  infusion 7 mcg/min (03/02/20 1249)  . sodium chloride 250 mL (02/28/20 1648)   PRN Meds:.acetaminophen **OR** acetaminophen, albuterol, LORazepam, morphine injection, ondansetron **OR** ondansetron (ZOFRAN) IV, sodium chloride   PHYSICAL EXAM: Vital signs: Vitals:   03/02/20 1515 03/02/20 1530 03/02/20 1545 03/02/20 1635  BP: 101/62 (!) 118/58 (!) 96/57   Pulse: (!) 46  73 76  Resp: (!) 24 (!) 23 (!) 25 (!) 26  Temp:    97.8 F (36.6 C)  TempSrc:    Axillary  SpO2: (!) 51%  (!) 83% 92%  Weight:      Height:       Filed Weights   02/29/20 0506 03/01/20 0447 03/02/20 0500  Weight: 46.3 kg 46 kg 45.7 kg   Body mass index is 16.77 kg/m.   Gen Exam:Alert awake-not in any distress HEENT:atraumatic, normocephalic Nose - NG tube Neck -- Rt IJ central line Chest: Diminished in bases, no wheezing CVS:S1S2 regular Abdomen:soft , diminished bowel sounds, not significantly distended, not significantly tender Extremities:no edema, pedal pulses noted Neurology: Non focal Skin: no rash, warm/dry GU- foley in situ--reinserted 03/02/2020 due to urinary retention  I have personally reviewed following labs and imaging studies  LABORATORY DATA: CBC: Recent Labs  Lab 02/28/20 0913 02/28/20 2035 02/29/20 0429 03/01/20 0430 03/02/20 0436  WBC 4.4 5.5 5.3 17.1* 17.2*  NEUTROABS 3.0  --   --   --  15.1*  HGB 15.2* 13.9 13.7 12.7 12.7  HCT 44.6 40.5 39.3 37.1 38.2  MCV 100.0 97.4 96.6 97.9 99.5  PLT 522* 514* 498* 398 418*    Basic Metabolic Panel: Recent Labs  Lab 02/28/20 0913 02/28/20 0957 02/28/20 2035 02/29/20 0429 02/29/20 1820 03/01/20 0430 03/02/20 0436  NA 129*  --  129* 129*  --  131* 134*  K 3.2*  --  3.9 3.4*  --  3.0* 3.4*  CL 86*  --  90* 91*  --  92* 95*  CO2 20*  --  24 26  --  28 26  GLUCOSE 113*  --  111* 141*  --  91 121*  BUN 99*  --  86* 77*  --  41* 24*  CREATININE 2.02*  --  1.73* 1.31* 0.76 0.62 0.69  CALCIUM 9.6  --  8.5* 8.3*  --  8.4* 8.6*   MG  --  2.1 2.2 2.0  --   --  1.8  PHOS  --   --  5.0*  --   --   --   --     GFR: Estimated Creatinine Clearance: 43.8 mL/min (by C-G formula based on SCr of 0.69 mg/dL).  Liver Function Tests: Recent Labs  Lab 02/28/20 0913 02/28/20 2035 02/29/20 0429 03/01/20 0430 03/02/20 0436  AST 22  23 25 20 30   ALT 17 14 15 12 14   ALKPHOS 51 35* 33* 41 50  BILITOT 1.4* 0.8 0.6 1.0 0.8  PROT 6.9 5.5* 5.6* 5.3* 5.4*  ALBUMIN 4.1 3.0* 3.1* 2.6* 2.7*   Recent Labs  Lab 02/28/20 0913  LIPASE 36   No results for input(s): AMMONIA in the last 168 hours.  Coagulation Profile: Recent Labs  Lab 02/28/20 0913  INR 0.9    Cardiac Enzymes: No results for input(s): CKTOTAL, CKMB, CKMBINDEX, TROPONINI in the last 168 hours.  BNP (last 3 results) No results for input(s): PROBNP in the last 8760 hours.  Lipid Profile: No results for input(s): CHOL, HDL, LDLCALC, TRIG, CHOLHDL, LDLDIRECT in the last 72 hours.  Thyroid Function Tests: No results for input(s): TSH, T4TOTAL, FREET4, T3FREE, THYROIDAB in the last 72 hours.  Anemia Panel: No results for input(s): VITAMINB12, FOLATE, FERRITIN, TIBC, IRON, RETICCTPCT in the last 72 hours.  Urine analysis:    Component Value Date/Time   COLORURINE AMBER (A) 02/28/2020 2200   APPEARANCEUR HAZY (A) 02/28/2020 2200   APPEARANCEUR Clear 03/20/2017 1112   LABSPEC 1.021 02/28/2020 2200   PHURINE 5.0 02/28/2020 2200   GLUCOSEU NEGATIVE 02/28/2020 2200   HGBUR NEGATIVE 02/28/2020 2200   BILIRUBINUR SMALL (A) 02/28/2020 2200   BILIRUBINUR Negative 03/20/2017 1112   KETONESUR 5 (A) 02/28/2020 2200   PROTEINUR NEGATIVE 02/28/2020 2200   UROBILINOGEN negative 01/25/2014 1529   NITRITE NEGATIVE 02/28/2020 2200   LEUKOCYTESUR NEGATIVE 02/28/2020 2200   Sepsis Labs: Lactic Acid, Venous    Component Value Date/Time   LATICACIDVEN 1.7 02/29/2020 0429    MICROBIOLOGY: Recent Results (from the past 240 hour(s))  Urine culture     Status:  None   Collection Time: 02/28/20  9:13 AM   Specimen: In/Out Cath Urine  Result Value Ref Range Status   Specimen Description   Final    IN/OUT CATH URINE Performed at [facility], [address] [place], [address] [place]    Special Requests   Final    NONE Performed at [facility], [address], [place], [address] [place]    Culture   Final    NO GROWTH Performed at [facility], [address] [place], [address] [place]    Report Status 02/29/2020 FINAL  Final  Blood Culture (routine x 2)     Status: None (Preliminary result)   Collection Time: 02/28/20  9:14 AM   Specimen: BLOOD  Result Value Ref Range Status   Specimen Description BLOOD RIGHT ANTECUBITAL  Final   Special Requests   Final    BOTTLES DRAWN AEROBIC AND ANAEROBIC Blood Culture results may not be optimal due to an inadequate volume of blood received in culture bottles   Culture   Final    NO GROWTH 3 DAYS Performed at [facility], [address] [place], [address] [place]    Report Status PENDING  Incomplete  Resp Panel by RT-PCR (Flu A&B, Covid) Nasopharyngeal Swab     Status: None   Collection Time: 02/28/20  9:20 AM   Specimen: Nasopharyngeal Swab; Nasopharyngeal(NP) swabs in vial transport medium  Result Value Ref Range Status   SARS Coronavirus 2 by RT PCR NEGATIVE NEGATIVE Final    Comment: (NOTE) SARS-CoV-2 target nucleic acids are NOT DETECTED.  The SARS-CoV-2 RNA is generally detectable in upper respiratory specimens during the acute phase of infection. The lowest concentration of SARS-CoV-2 viral copies this assay can detect is 138 copies/mL.  A negative result does not preclude SARS-Cov-2 infection and should not be used as the sole basis for treatment or other patient management decisions. A negative result may occur with  improper specimen collection/handling, submission of specimen other than nasopharyngeal swab, presence of viral mutation(s) within the areas targeted by this  assay, and inadequate number of viral copies(<138 copies/mL). A negative result must be combined with clinical observations, patient history, and epidemiological information. The expected result is Negative.  Fact Sheet for Patients:  BloggerCourse.com  Fact Sheet for Healthcare Providers:  SeriousBroker.it  This test is no t yet approved or cleared by the [place] FDA and  has been authorized for detection and/or diagnosis of SARS-CoV-2 by FDA under an Emergency Use Authorization (EUA). This EUA will remain  in effect (meaning this test can be used) for the duration of the COVID-19 declaration under Section 564(b)(1) of the Act, 21 U.S.C.section 360bbb-3(b)(1), unless the authorization is terminated  or revoked sooner.       Influenza A by PCR NEGATIVE NEGATIVE Final   Influenza B by PCR NEGATIVE NEGATIVE Final    Comment: (NOTE) The Xpert Xpress SARS-CoV-2/FLU/RSV plus assay is intended as an aid in the diagnosis of influenza from Nasopharyngeal swab specimens and should not be used as a sole basis for treatment. Nasal washings and aspirates are unacceptable for Xpert Xpress SARS-CoV-2/FLU/RSV testing.  Fact Sheet for Patients: BloggerCourse.com  Fact Sheet for Healthcare Providers: SeriousBroker.it  This test is not yet approved or cleared by the [place] FDA and has been authorized for detection and/or diagnosis of SARS-CoV-2 by FDA under an Emergency Use Authorization (EUA). This EUA will remain in effect (meaning this test can be used) for the duration of the COVID-19 declaration under Section 564(b)(1) of the Act, 21 U.S.C. section 360bbb-3(b)(1), unless the authorization is terminated or revoked.  Performed at [facility], [address] [place], [place] [address]   Blood Culture (routine x 2)     Status: None (Preliminary result)   Collection Time:  02/28/20  9:57 AM   Specimen: BLOOD LEFT FOREARM  Result Value Ref Range Status   Specimen Description BLOOD LEFT FOREARM  Final   Special Requests   Final    BOTTLES DRAWN AEROBIC AND ANAEROBIC Blood Culture results may not be optimal due to an inadequate volume of blood received in culture bottles   Culture   Final    NO GROWTH 3 DAYS Performed at [facility], [address] [place], [place] [address]    Report Status PENDING  Incomplete  MRSA PCR Screening     Status: Abnormal   Collection Time: 02/28/20  2:35 PM   Specimen: Nasopharyngeal  Result Value Ref Range Status   MRSA by PCR POSITIVE (A) NEGATIVE Final    Comment:        The GeneXpert MRSA Assay (FDA approved for NASAL specimens only), is one component of a comprehensive MRSA colonization surveillance program. It is not intended to diagnose MRSA infection nor to guide or monitor treatment for MRSA infections. RESULT CALLED TO, READ BACK BY AND VERIFIED WITH: SHELTON,A AT 1632 BY HUFFINES,S ON 02/28/20. Performed at [facility], [address], [place], [place] [address]   Culture, blood (Routine X 2) w Reflex to ID Panel     Status: None (Preliminary result)   Collection Time: 03/01/20 11:33 AM   Specimen: BLOOD  Result Value Ref Range Status   Specimen Description BLOOD BLOOD RIGHT ARM  Final   Special  Requests   Final    BOTTLES DRAWN AEROBIC AND ANAEROBIC Blood Culture adequate volume   Culture   Final    NO GROWTH < 24 HOURS Performed at [facility], [address], [place], [place] [address]    Report Status PENDING  Incomplete  Culture, blood (Routine X 2) w Reflex to ID Panel     Status: None (Preliminary result)   Collection Time: 03/01/20 11:33 AM   Specimen: BLOOD  Result Value Ref Range Status   Specimen Description BLOOD BLOOD LEFT ARM  Final   Special Requests   Final    BOTTLES DRAWN AEROBIC AND ANAEROBIC Blood Culture results may not be optimal due to an inadequate volume of blood received  in culture bottles   Culture   Final    NO GROWTH < 24 HOURS Performed at [facility], [address] [place], [place] [address]    Report Status PENDING  Incomplete    RADIOLOGY STUDIES/RESULTS: DG Abd 1 View  Result Date: 03/01/2020 CLINICAL DATA:  Small bowel obstruction EXAM: ABDOMEN - 1 VIEW COMPARISON:  CT 2 days ago FINDINGS: Ongoing small bowel obstruction with dilated loops in the central abdomen measuring up to 3.8 cm. The degree of distension is improved, but no new colonic gas is seen. No gross pneumoperitoneum. Clear lung bases. IMPRESSION: Ongoing small bowel obstruction with improved small bowel distension. Electronically Signed   By: Marnee Spring M.D.   On: 03/01/2020 07:44   DG ABD ACUTE 2+V W 1V CHEST  Result Date: 03/02/2020 CLINICAL DATA:  Small-bowel obstruction. EXAM: DG ABDOMEN ACUTE WITH 1 VIEW CHEST COMPARISON:  March 01, 2020. FINDINGS: Mild left basilar opacities with partial silhouetting of the left hemidiaphragm. No visible pleural effusions or pneumothorax. Stable cardiomediastinal silhouette. Enteric tube courses below the diaphragm with the tip in the distal stomach/peripyloric region, unchanged. Right central venous catheter with the tip projecting in the region of the superior cavoatrial junction. Slight increase in small bowel dilation with small bowel measuring up to 4 cm in the left lower abdomen. No other interval change. No gross pneumoperitoneum on this supine radiograph. Surgical clips projecting over the lower abdomen/pelvis. Polyarticular degenerative change. IMPRESSION: 1. Slight increase in small bowel dilation. 2. Mild left basilar opacities, which may represent atelectasis, aspiration, and/or pneumonia. Electronically Signed   By: Feliberto Harts MD   On: 03/02/2020 08:20   DG Abd Portable 1V-Small Bowel Obstruction Protocol-initial, 8 hr delay  Result Date: 03/01/2020 CLINICAL DATA:  74 year old female with small bowel obstruction. 8 hour  delayed image. EXAM: PORTABLE ABDOMEN - 1 VIEW COMPARISON:  CT of the abdomen pelvis dated 02/28/2020 abdominal radiograph dated 03/01/2020 FINDINGS: Enteric tube with tip in the distal stomach. Improved dilatation of the small bowel compared to the prior radiograph now measuring up to 3 cm in the left lower abdomen. No other interval change. IMPRESSION: Improved dilatation of the small bowel compared to prior radiograph. Continued follow-up recommended. Electronically Signed   By: Elgie Collard M.D.   On: 03/01/2020 22:29    LOS: 3 days   Shon Hale, MD  [facility]  To contact the attending provider between 7A-7P or the covering provider during after hours 7P-7A, please log into the web site [email or web address] and access using universal [place] password for that web site. If you do not have the password, please call the hospital operator.  03/02/2020, 5:42 PM

## 2020-03-02 NOTE — Progress Notes (Addendum)
Pharmacy Antibiotic Note  Samantha Huerta is a 74 y.o. female admitted on 02/28/2020 with SBO and hypovolemic shock secondary to GI loss due to vomiting; also with AKI, with Scr now back to BL. Pharmacy was consulted for Zosyn and vancomycin dosing for sepsis; pt rec'd 1 dose of vancomycin on 12/20 and Zosyn from 12/20-21, then empiric antibiotics were discontinued. Pharmacy is now consulted to dose Unasyn for aspiration pneumonia.  WBC up to 17.2, Tmax 99.6; Scr 0.69, CrCl 43.8 ml/min (AKI resolved, renal function stable)  Plan: Unasyn 3 gm IV Q 6 hrs Monitor WBC, temp, clinical improvement, cultures, renal function  Height: 5\' 5"  (165.1 cm) Weight: 45.7 kg (100 lb 12 oz) IBW/kg (Calculated) : 57  Temp (24hrs), Avg:98.4 F (36.9 C), Min:97.7 F (36.5 C), Max:99.6 F (37.6 C)  Recent Labs  Lab 02/28/20 0913 02/28/20 1145 02/28/20 2035 02/29/20 0429 02/29/20 1820 03/01/20 0430 03/02/20 0436  WBC 4.4  --  5.5 5.3  --  17.1* 17.2*  CREATININE 2.02*  --  1.73* 1.31* 0.76 0.62 0.69  LATICACIDVEN 2.4* 3.5*  --  1.7  --   --   --     Estimated Creatinine Clearance: 43.8 mL/min (by C-G formula based on SCr of 0.69 mg/dL).    No Known Allergies  Antimicrobials this admission: 12/20 ceftriaxone X 1 12/20 metronidazole IV X 1 12/20 Zosyn >>12/21 12/20 Vancomycin X 1  Microbiology results: 12/20 BCx X 2: NGTD 12/20 UCx: NG/final 12/22 BC X 2:  NGTD 12/20 MRSA PCR: positivie 12/20 COVID, flu A, flu B: negative  Thank you for allowing pharmacy to be a part of this patient's care.  1/21, PharmD, BCPS, [facility] Clinical Pharmacist 03/02/2020 5:54 PM

## 2020-03-03 ENCOUNTER — Inpatient Hospital Stay (HOSPITAL_COMMUNITY): Payer: Medicare Other

## 2020-03-03 DIAGNOSIS — K56609 Unspecified intestinal obstruction, unspecified as to partial versus complete obstruction: Secondary | ICD-10-CM | POA: Diagnosis not present

## 2020-03-03 LAB — GLUCOSE, CAPILLARY: Glucose-Capillary: 103 mg/dL — ABNORMAL HIGH (ref 70–99)

## 2020-03-03 MED ORDER — LORAZEPAM 2 MG/ML IJ SOLN
1.0000 mg | INTRAMUSCULAR | Status: DC | PRN
  Administered 2020-03-03 – 2020-03-05 (×6): 1 mg via INTRAVENOUS
  Filled 2020-03-03 (×6): qty 1

## 2020-03-03 MED ORDER — TRAVASOL 10 % IV SOLN
INTRAVENOUS | Status: AC
  Filled 2020-03-03: qty 412.8

## 2020-03-03 MED ORDER — LACTATED RINGERS IV SOLN: INTRAVENOUS | Status: DC

## 2020-03-03 MED ORDER — INSULIN ASPART 100 UNIT/ML ~~LOC~~ SOLN
0.0000 [IU] | Freq: Three times a day (TID) | SUBCUTANEOUS | Status: DC
  Administered 2020-03-04 – 2020-03-05 (×3): 2 [IU] via SUBCUTANEOUS

## 2020-03-03 NOTE — Progress Notes (Signed)
PROGRESS NOTE        PATIENT DETAILS Name: Samantha Huerta Age: 74 y.o. Sex: female Date of Birth: [birth year] Admit Date: 02/28/2020 Admitting Physician [name] [name], MD PNT:IRWER, [name], FNP  Brief Narrative: Patient is a 74 y.o. female with PMHx of COPD, HTN, glaucoma, chronic back pain, tobacco use, alcohol abuse-presented with 5-day history of abdominal pain, vomiting.  She was found to have hypovolemic shock, AKI and high-grade bowel obstruction.  She was kept n.p.o.-NGT was placed-IV fluid resuscitation was started-she was subsequently admitted to the ICU.  Even with IV fluid resuscitation-she remained hypotensive-hence right IJ central venous access was obtained-and patient was started on pressors.  See below for further details.  Significant events: 12/20>> admit for hypovolemic shock, AKI in the setting of bowel obstruction. 12/20>> persistently hypotensive in spite of IVF resuscitation-right IJ placed by surgery-started on Levophed -TPN started on 03/03/2020  Significant studies: 12/20>> CT abdomen/pelvis: High-grade SBO related to adhesions in the proximal ileum 12/20>> chest x-ray: No pneumonia (personally reviewed) 12/21- abd xray with SBO  Antimicrobial therapy: Vancomycin: 12/20>> 12/21 Zosyn: 12/20>> 12/21 Ceftriaxone: 12/20 x 1 in ED Flagyl: 12/20 x 1 in ED -Unasyn started on 03/02/2020  Microbiology data: 12/20>> blood cultures: Pending 03/01/20 Repeat Blood cx pending  Procedures : 12/20>> right IJ central venous catheter placed by Dr. Henreitta Leber  Consults: General surgery  DVT Prophylaxis : heparin injection 5,000 Units Start: 02/28/20 1400  Subjective:  Alcohol withdrawal symptoms/delirium tremens-persist - Granddaughter at [place] at bedside -Foley with adequate urine output, -T-max 101 -  Assessment/Plan: Hypovolemic shock: Due to severe dehydration/vomiting-BP improving after initiation of Levophed- weaning  down levophed--- down to -Unable to wean off Levophed completely at this time, would continue to try   Continue IVF- Random cortisol >100  -Empiric Vanco and Zosyn discontinued on 02/29/2020 -Repeat blood cultures requested on 03/01/20 due to worsening leukocytosis and persistent hypotension -IV Unasyn started on 03/02/2020 for aspiration pneumonia  -Presumed Aspiration Pneumonia--leukocytosis noted, Unasyn as ordered  AKI: Hemodynamically mediated due to vomiting and lisinopril use.  UA without proteinuria, CT abdomen without hydronephrosis. -Creatinine is down to 0.69 from 2.0 on admission -renally adjust medications, avoid nephrotoxic agents / dehydration  / hypotension  -Urinary retention--patient failed voiding trial on 03/01/2020,  foley in situ--reinserted 03/02/2020 due to urinary retention  Hypokalemia: Due to GI loss-NG tube suctioning-continue to replete.    Hyponatremia: Mild: Due to hypovolemia-continue gentle IV fluid hydration- -sodium is up to 134 from 129  SBO: Remains n.p.o.-NG tube in place-no BM or flatus yet- -Small bowel follow-through on 03/01/2020 and subsequent serial x-rays demonstrates that SBO is Not worse,  -General surgeon Dr. Henreitta Leber recommends conservative management, hold off on operative intervention at this time -Start IV TPN on 03/03/2020  COPD: Stable-continue bronchodilators.  Glaucoma: Continue eyedrops.  Tobacco abuse:use transdermal nicotine.  Alcohol abuse/delirium tremens: --Alcohol withdrawal symptoms/delirium tremens-worsened  -c/n Precedex drip at 1.4, okay to use as needed Ativan PTA pt drank significant amount of vodka daily-claims last drink was on 12/18- --patient with delirium tremens, agitation and confusion --CIWA protocol. - Chronic back pain: On as needed narcotics  CRITICAL CARE Performed by: Shon Hale  Total critical care time: 46 minutes  Critical care time was exclusive of separately billable procedures  and treating other patients. --Persistent hemodynamic instability and hypotension requiring IV Levophed drip  continuously --- Delirium tremens with persistent agitation and DTs requiring IV Precedex drip continuously  Critical care was necessary to treat or prevent imminent or life-threatening deterioration.  Critical care was time spent personally by me on the following activities: development of treatment plan with patient and/or surrogate as well as nursing, discussions with consultants, evaluation of patient's response to treatment, examination of patient, obtaining history from patient or surrogate, ordering and performing treatments and interventions, ordering and review of laboratory studies, ordering and review of radiographic studies, pulse oximetry and re-evaluation of patient's condition.   Diet: Diet Order            Diet NPO time specified  Diet effective now                  Code Status: Full code   Family Communication: Daughter-[name]-[phone]-, also discussed with granddaughter Ladona Ridgel at bedside on 03/03/2020  Disposition Plan: Status is: Inpatient  Remains inpatient appropriate because:Inpatient level of care appropriate due to severity of illness   Dispo: The patient is from: Home              Anticipated d/c is to: Home              Anticipated d/c date is: 3 days              Patient currently is not medically stable to d/c.   Barriers to Discharge: SBO-AKI-Hypovolumic shock on Levophed-remains NPO  Antimicrobial agents: Anti-infectives (From admission, onward)   Start     Dose/Rate Route Frequency Ordered Stop   03/02/20 1830  Ampicillin-Sulbactam (UNASYN) 3 g in sodium chloride 0.9 % 100 mL IVPB        3 g 200 mL/hr over 30 Minutes Intravenous Every 6 hours 03/02/20 1804     02/29/20 2200  vancomycin (VANCOREADY) IVPB 500 mg/100 mL  Status:  Discontinued        500 mg 100 mL/hr over 60 Minutes Intravenous Every 24 hours 02/29/20 0851  02/29/20 1001   02/29/20 1400  piperacillin-tazobactam (ZOSYN) IVPB 3.375 g  Status:  Discontinued        3.375 g 12.5 mL/hr over 240 Minutes Intravenous Every 8 hours 02/29/20 0855 02/29/20 1001   02/28/20 1900  vancomycin (VANCOCIN) IVPB 1000 mg/200 mL premix        1,000 mg 200 mL/hr over 60 Minutes Intravenous  Once 02/28/20 1825 02/29/20 0729   02/28/20 1845  piperacillin-tazobactam (ZOSYN) IVPB 3.375 g  Status:  Discontinued        3.375 g 12.5 mL/hr over 240 Minutes Intravenous Every 12 hours 02/28/20 1831 02/29/20 0855   02/28/20 1830  piperacillin-tazobactam (ZOSYN) IVPB 3.375 g  Status:  Discontinued        3.375 g 100 mL/hr over 30 Minutes Intravenous Every 12 hours 02/28/20 1825 02/28/20 1831   02/28/20 1825  vancomycin variable dose per unstable renal function (pharmacist dosing)  Status:  Discontinued         Does not apply See admin instructions 02/28/20 1825 02/29/20 0851   02/28/20 0930  cefTRIAXone (ROCEPHIN) 2 g in sodium chloride 0.9 % 100 mL IVPB        2 g 200 mL/hr over 30 Minutes Intravenous  Once 02/28/20 0920 02/28/20 1017   02/28/20 0930  metroNIDAZOLE (FLAGYL) IVPB 500 mg        500 mg 100 mL/hr over 60 Minutes Intravenous  Once 02/28/20 0920 02/28/20 1057       MEDICATIONS:  Scheduled Meds: . arformoterol  15 mcg Nebulization BID  . bisacodyl  10 mg Rectal Once  . brinzolamide  1 drop Both Eyes BID  . chlorhexidine  15 mL Mouth Rinse BID  . Chlorhexidine Gluconate Cloth  6 each Topical Daily  . Chlorhexidine Gluconate Cloth  6 each Topical Q0600  . folic acid  1 mg Intravenous Daily  . heparin  5,000 Units Subcutaneous Q8H  . [START ON 03/04/2020] insulin aspart  0-9 Units Subcutaneous Q8H  . latanoprost  1 drop Both Eyes QHS  . mouth rinse  15 mL Mouth Rinse q12n4p  . mupirocin ointment  1 application Nasal BID  . nicotine  14 mg Transdermal Daily  . pantoprazole (PROTONIX) IV  40 mg Intravenous Q12H  . thiamine  100 mg Oral Daily   Or  .  thiamine  100 mg Intravenous Daily  . umeclidinium bromide  1 puff Inhalation Daily   Continuous Infusions: . ampicillin-sulbactam (UNASYN) IV 3 g (03/03/20 0953)  . dexmedetomidine (PRECEDEX) IV infusion 1.4 mcg/kg/hr (03/03/20 0849)  . lactated ringers    . norepinephrine (LEVOPHED) Adult infusion 4 mcg/min (03/03/20 0953)  . sodium chloride 250 mL (02/28/20 1648)  . TPN ADULT (ION)     PRN Meds:.acetaminophen **OR** acetaminophen, albuterol, LORazepam, morphine injection, ondansetron **OR** ondansetron (ZOFRAN) IV, sodium chloride   PHYSICAL EXAM: Vital signs: Vitals:   03/03/20 1000 03/03/20 1100 03/03/20 1200 03/03/20 1300  BP: 106/63 112/69 112/71 101/82  Pulse: 81 97  89  Resp: (!) 24 (!) 24 (!) 34 (!) 35  Temp:      TempSrc:      SpO2: 97% 97%  99%  Weight:      Height:       Filed Weights   03/01/20 0447 03/02/20 0500 03/03/20 0500  Weight: 46 kg 45.7 kg 45.6 kg   Body mass index is 16.73 kg/m.   Gen Exam:Alert awake-not in any distress HEENT:atraumatic, normocephalic Nose - NG tube Neck -- Rt IJ central line Chest: Diminished in left base, no wheezing CVS:S1S2 regular Abdomen:soft , diminished bowel sounds, not significantly distended, not significantly tender Extremities:no edema, pedal pulses noted Neurology: Non focal Skin: no rash, warm/dry GU- foley in situ--reinserted 03/02/2020 due to urinary retention  I have personally reviewed following labs and imaging studies  LABORATORY DATA: CBC: Recent Labs  Lab 02/28/20 0913 02/28/20 2035 02/29/20 0429 03/01/20 0430 03/02/20 0436  WBC 4.4 5.5 5.3 17.1* 17.2*  NEUTROABS 3.0  --   --   --  15.1*  HGB 15.2* 13.9 13.7 12.7 12.7  HCT 44.6 40.5 39.3 37.1 38.2  MCV 100.0 97.4 96.6 97.9 99.5  PLT 522* 514* 498* 398 418*    Basic Metabolic Panel: Recent Labs  Lab 02/28/20 0913 02/28/20 0957 02/28/20 2035 02/29/20 0429 02/29/20 1820 03/01/20 0430 03/02/20 0436  NA 129*  --  129* 129*  --  131*  134*  K 3.2*  --  3.9 3.4*  --  3.0* 3.4*  CL 86*  --  90* 91*  --  92* 95*  CO2 20*  --  24 26  --  28 26  GLUCOSE 113*  --  111* 141*  --  91 121*  BUN 99*  --  86* 77*  --  41* 24*  CREATININE 2.02*  --  1.73* 1.31* 0.76 0.62 0.69  CALCIUM 9.6  --  8.5* 8.3*  --  8.4* 8.6*  MG  --  2.1 2.2 2.0  --   --  1.8  PHOS  --   --  5.0*  --   --   --   --     GFR: Estimated Creatinine Clearance: 43.7 mL/min (by C-G formula based on SCr of 0.69 mg/dL).  Liver Function Tests: Recent Labs  Lab 02/28/20 0913 02/28/20 2035 02/29/20 0429 03/01/20 0430 03/02/20 0436  AST 22 23 25 20 30   ALT 17 14 15 12 14   ALKPHOS 51 35* 33* 41 50  BILITOT 1.4* 0.8 0.6 1.0 0.8  PROT 6.9 5.5* 5.6* 5.3* 5.4*  ALBUMIN 4.1 3.0* 3.1* 2.6* 2.7*   Recent Labs  Lab 02/28/20 0913  LIPASE 36   No results for input(s): AMMONIA in the last 168 hours.  Coagulation Profile: Recent Labs  Lab 02/28/20 0913  INR 0.9    Cardiac Enzymes: No results for input(s): CKTOTAL, CKMB, CKMBINDEX, TROPONINI in the last 168 hours.  BNP (last 3 results) No results for input(s): PROBNP in the last 8760 hours.  Lipid Profile: No results for input(s): CHOL, HDL, LDLCALC, TRIG, CHOLHDL, LDLDIRECT in the last 72 hours.  Thyroid Function Tests: No results for input(s): TSH, T4TOTAL, FREET4, T3FREE, THYROIDAB in the last 72 hours.  Anemia Panel: No results for input(s): VITAMINB12, FOLATE, FERRITIN, TIBC, IRON, RETICCTPCT in the last 72 hours.  Urine analysis:    Component Value Date/Time   COLORURINE AMBER (A) 02/28/2020 2200   APPEARANCEUR HAZY (A) 02/28/2020 2200   APPEARANCEUR Clear 03/20/2017 1112   LABSPEC 1.021 02/28/2020 2200   PHURINE 5.0 02/28/2020 2200   GLUCOSEU NEGATIVE 02/28/2020 2200   HGBUR NEGATIVE 02/28/2020 2200   BILIRUBINUR SMALL (A) 02/28/2020 2200   BILIRUBINUR Negative 03/20/2017 1112   KETONESUR 5 (A) 02/28/2020 2200   PROTEINUR NEGATIVE 02/28/2020 2200   UROBILINOGEN negative  01/25/2014 1529   NITRITE NEGATIVE 02/28/2020 2200   LEUKOCYTESUR NEGATIVE 02/28/2020 2200   Sepsis Labs: Lactic Acid, Venous    Component Value Date/Time   LATICACIDVEN 1.7 02/29/2020 0429    MICROBIOLOGY: Recent Results (from the past 240 hour(s))  Urine culture     Status: None   Collection Time: 02/28/20  9:13 AM   Specimen: In/Out Cath Urine  Result Value Ref Range Status   Specimen Description   Final    IN/OUT CATH URINE Performed at [facility], [address] [place], [place] [address]    Special Requests   Final    NONE Performed at [facility], [address], [place], [place] [address]    Culture   Final    NO GROWTH Performed at [facility], [address], [place], [place] [address]    Report Status 02/29/2020 FINAL  Final  Blood Culture (routine x 2)     Status: None (Preliminary result)   Collection Time: 02/28/20  9:14 AM   Specimen: BLOOD  Result Value Ref Range Status   Specimen Description BLOOD RIGHT ANTECUBITAL  Final   Special Requests   Final    BOTTLES DRAWN AEROBIC AND ANAEROBIC Blood Culture results may not be optimal due to an inadequate volume of blood received in culture bottles   Culture   Final    NO GROWTH 4 DAYS Performed at [facility], [address], [place], [place] [address]    Report Status PENDING  Incomplete  Resp Panel by RT-PCR (Flu A&B, Covid) Nasopharyngeal Swab     Status: None   Collection Time: 02/28/20  9:20 AM   Specimen: Nasopharyngeal Swab; Nasopharyngeal(NP) swabs in vial transport  medium  Result Value Ref Range Status   SARS Coronavirus 2 by RT PCR NEGATIVE NEGATIVE Final    Comment: (NOTE) SARS-CoV-2 target nucleic acids are NOT DETECTED.  The SARS-CoV-2 RNA is generally detectable in upper respiratory specimens during the acute phase of infection. The lowest concentration of SARS-CoV-2 viral copies this assay can detect is 138 copies/mL. A negative result does not preclude SARS-Cov-2 infection and  should not be used as the sole basis for treatment or other patient management decisions. A negative result may occur with  improper specimen collection/handling, submission of specimen other than nasopharyngeal swab, presence of viral mutation(s) within the areas targeted by this assay, and inadequate number of viral copies(<138 copies/mL). A negative result must be combined with clinical observations, patient history, and epidemiological information. The expected result is Negative.  Fact Sheet for Patients:  BloggerCourse.comhttps://www.fda.gov/media/152166/download  Fact Sheet for Healthcare Providers:  SeriousBroker.ithttps://www.fda.gov/media/152162/download  This test is no t yet approved or cleared by the [place] FDA and  has been authorized for detection and/or diagnosis of SARS-CoV-2 by FDA under an Emergency Use Authorization (EUA). This EUA will remain  in effect (meaning this test can be used) for the duration of the COVID-19 declaration under Section 564(b)(1) of the Act, 21 U.S.C.section 360bbb-3(b)(1), unless the authorization is terminated  or revoked sooner.       Influenza A by PCR NEGATIVE NEGATIVE Final   Influenza B by PCR NEGATIVE NEGATIVE Final    Comment: (NOTE) The Xpert Xpress SARS-CoV-2/FLU/RSV plus assay is intended as an aid in the diagnosis of influenza from Nasopharyngeal swab specimens and should not be used as a sole basis for treatment. Nasal washings and aspirates are unacceptable for Xpert Xpress SARS-CoV-2/FLU/RSV testing.  Fact Sheet for Patients: BloggerCourse.comhttps://www.fda.gov/media/152166/download  Fact Sheet for Healthcare Providers: SeriousBroker.ithttps://www.fda.gov/media/152162/download  This test is not yet approved or cleared by the [place] FDA and has been authorized for detection and/or diagnosis of SARS-CoV-2 by FDA under an Emergency Use Authorization (EUA). This EUA will remain in effect (meaning this test can be used) for the duration of the COVID-19 declaration  under Section 564(b)(1) of the Act, 21 U.S.C. section 360bbb-3(b)(1), unless the authorization is terminated or revoked.  Performed at [facility], [address] [place], [place] [address]   Blood Culture (routine x 2)     Status: None (Preliminary result)   Collection Time: 02/28/20  9:57 AM   Specimen: BLOOD LEFT FOREARM  Result Value Ref Range Status   Specimen Description BLOOD LEFT FOREARM  Final   Special Requests   Final    BOTTLES DRAWN AEROBIC AND ANAEROBIC Blood Culture results may not be optimal due to an inadequate volume of blood received in culture bottles   Culture   Final    NO GROWTH 4 DAYS Performed at [facility], [address] [place], [place] [address]    Report Status PENDING  Incomplete  MRSA PCR Screening     Status: Abnormal   Collection Time: 02/28/20  2:35 PM   Specimen: Nasopharyngeal  Result Value Ref Range Status   MRSA by PCR POSITIVE (A) NEGATIVE Final    Comment:        The GeneXpert MRSA Assay (FDA approved for NASAL specimens only), is one component of a comprehensive MRSA colonization surveillance program. It is not intended to diagnose MRSA infection nor to guide or monitor treatment for MRSA infections. RESULT CALLED TO, READ BACK BY AND VERIFIED WITH: SHELTON,A AT 1632 BY HUFFINES,S ON 02/28/20.  Performed at [facility], [address], [place], [place] [address]   Culture, blood (Routine X 2) w Reflex to ID Panel     Status: None (Preliminary result)   Collection Time: 03/01/20 11:33 AM   Specimen: BLOOD  Result Value Ref Range Status   Specimen Description BLOOD BLOOD RIGHT ARM  Final   Special Requests   Final    BOTTLES DRAWN AEROBIC AND ANAEROBIC Blood Culture adequate volume   Culture   Final    NO GROWTH 2 DAYS Performed at [facility], [address], [place], [place] [address]    Report Status PENDING  Incomplete  Culture, blood (Routine X 2) w Reflex to ID Panel     Status: None (Preliminary result)   Collection  Time: 03/01/20 11:33 AM   Specimen: BLOOD  Result Value Ref Range Status   Specimen Description BLOOD BLOOD LEFT ARM  Final   Special Requests   Final    BOTTLES DRAWN AEROBIC AND ANAEROBIC Blood Culture results may not be optimal due to an inadequate volume of blood received in culture bottles   Culture   Final    NO GROWTH 2 DAYS Performed at [facility], [address] [place], [place] [address]    Report Status PENDING  Incomplete    RADIOLOGY STUDIES/RESULTS: DG ABD ACUTE 2+V W 1V CHEST  Result Date: 03/03/2020 CLINICAL DATA:  Small-bowel obstruction, COPD, hypertension, smoker EXAM: DG ABDOMEN ACUTE WITH 1 VIEW CHEST COMPARISON:  03/02/2020 FINDINGS: Tip of RIGHT jugular line projects over SVC. Nasogastric tube extends into stomach. Normal heart size, mediastinal contours, and pulmonary vascularity. Severe aorta Emphysematous and bronchitic changes consistent with COPD. Atelectasis versus consolidation LEFT lower lobe slightly increased. Remaining lungs free of acute infiltrate. No pleural effusion or pneumothorax. Single prominent small bowel loop in the mid abdomen. Questionable gastric wall thickening versus artifact from underdistention. No bowel wall thickening or free air. Multilevel degenerative disc disease changes lumbar spine. Degenerative changes RIGHT hip joint. Surgical clips in pelvis. IMPRESSION: COPD changes with atelectasis versus consolidation LEFT lower lobe. Single prominent small bowel loop in the mid abdomen with decreased small bowel distension since previous study. Questionable gastric wall thickening versus artifact from underdistention. Electronically Signed   By: Ulyses Southward M.D.   On: 03/03/2020 09:47   DG ABD ACUTE 2+V W 1V CHEST  Result Date: 03/02/2020 CLINICAL DATA:  Small-bowel obstruction. EXAM: DG ABDOMEN ACUTE WITH 1 VIEW CHEST COMPARISON:  March 01, 2020. FINDINGS: Mild left basilar opacities with partial silhouetting of the left hemidiaphragm. No  visible pleural effusions or pneumothorax. Stable cardiomediastinal silhouette. Enteric tube courses below the diaphragm with the tip in the distal stomach/peripyloric region, unchanged. Right central venous catheter with the tip projecting in the region of the superior cavoatrial junction. Slight increase in small bowel dilation with small bowel measuring up to 4 cm in the left lower abdomen. No other interval change. No gross pneumoperitoneum on this supine radiograph. Surgical clips projecting over the lower abdomen/pelvis. Polyarticular degenerative change. IMPRESSION: 1. Slight increase in small bowel dilation. 2. Mild left basilar opacities, which may represent atelectasis, aspiration, and/or pneumonia. Electronically Signed   By: Feliberto Harts MD   On: 03/02/2020 08:20   DG Abd Portable 1V-Small Bowel Obstruction Protocol-initial, 8 hr delay  Result Date: 03/01/2020 CLINICAL DATA:  74 year old female with small bowel obstruction. 8 hour delayed image. EXAM: PORTABLE ABDOMEN - 1 VIEW COMPARISON:  CT of the abdomen pelvis dated 02/28/2020 abdominal radiograph dated  03/01/2020 FINDINGS: Enteric tube with tip in the distal stomach. Improved dilatation of the small bowel compared to the prior radiograph now measuring up to 3 cm in the left lower abdomen. No other interval change. IMPRESSION: Improved dilatation of the small bowel compared to prior radiograph. Continued follow-up recommended. Electronically Signed   By: Elgie Collard M.D.   On: 03/01/2020 22:29    LOS: 4 days   Shon Hale, MD  [facility]  To contact the attending provider between 7A-7P or the covering provider during after hours 7P-7A, please log into the web site [email or web address] and access using universal Commerce password for that web site. If you do not have the password, please call the hospital operator.  03/03/2020, 1:14 PM

## 2020-03-03 NOTE — Progress Notes (Signed)
[facility] Progress Note     Subjective: Xray this AM with some improvement, continued loop that is distended. No BM but NG output down with pulling it back (it was too distal).   Alcohol withdrawal and PNA treatment continuing.   Objective: Vital signs in last 24 hours: Temp:  [97.8 F (36.6 C)-101 F (38.3 C)] 99.3 F (37.4 C) (12/24 0400) Pulse Rate:  [29-100] 60 (12/24 0700) Resp:  [12-43] 30 (12/24 0900) BP: (83-119)/(44-80) 104/65 (12/24 0900) SpO2:  [51 %-100 %] 97 % (12/24 0813) Weight:  [45.6 kg] 45.6 kg (12/24 0500) Last BM Date: 02/23/20  Intake/Output from previous day: 12/23 0701 - 12/24 0700 In: 1572 [I.V.:1362; IV Piggyback:210] Out: 1415 [Urine:815; Emesis/NG output:600] Intake/Output this shift: No intake/output data recorded.  General appearance: uncooperative GI: soft, nondistended, nontender  Lab Results:  Recent Labs    03/01/20 0430 03/02/20 0436  WBC 17.1* 17.2*  HGB 12.7 12.7  HCT 37.1 38.2  PLT 398 418*   BMET Recent Labs    03/01/20 0430 03/02/20 0436  NA 131* 134*  K 3.0* 3.4*  CL 92* 95*  CO2 28 26  GLUCOSE 91 121*  BUN 41* 24*  CREATININE 0.62 0.69  CALCIUM 8.4* 8.6*   PT/INR No results for input(s): LABPROT, INR in the last 72 hours.  Studies/Results: DG ABD ACUTE 2+V W 1V CHEST  Result Date: 03/03/2020 CLINICAL DATA:  Small-bowel obstruction, COPD, hypertension, smoker EXAM: DG ABDOMEN ACUTE WITH 1 VIEW CHEST COMPARISON:  03/02/2020 FINDINGS: Tip of RIGHT jugular line projects over SVC. Nasogastric tube extends into stomach. Normal heart size, mediastinal contours, and pulmonary vascularity. Severe aorta Emphysematous and bronchitic changes consistent with COPD. Atelectasis versus consolidation LEFT lower lobe slightly increased. Remaining lungs free of acute infiltrate. No pleural effusion or pneumothorax. Single prominent small bowel loop in the mid abdomen. Questionable gastric wall thickening versus  artifact from underdistention. No bowel wall thickening or free air. Multilevel degenerative disc disease changes lumbar spine. Degenerative changes RIGHT hip joint. Surgical clips in pelvis. IMPRESSION: COPD changes with atelectasis versus consolidation LEFT lower lobe. Single prominent small bowel loop in the mid abdomen with decreased small bowel distension since previous study. Questionable gastric wall thickening versus artifact from underdistention. Electronically Signed   By: Ulyses Southward M.D.   On: 03/03/2020 09:47   DG ABD ACUTE 2+V W 1V CHEST  Result Date: 03/02/2020 CLINICAL DATA:  Small-bowel obstruction. EXAM: DG ABDOMEN ACUTE WITH 1 VIEW CHEST COMPARISON:  March 01, 2020. FINDINGS: Mild left basilar opacities with partial silhouetting of the left hemidiaphragm. No visible pleural effusions or pneumothorax. Stable cardiomediastinal silhouette. Enteric tube courses below the diaphragm with the tip in the distal stomach/peripyloric region, unchanged. Right central venous catheter with the tip projecting in the region of the superior cavoatrial junction. Slight increase in small bowel dilation with small bowel measuring up to 4 cm in the left lower abdomen. No other interval change. No gross pneumoperitoneum on this supine radiograph. Surgical clips projecting over the lower abdomen/pelvis. Polyarticular degenerative change. IMPRESSION: 1. Slight increase in small bowel dilation. 2. Mild left basilar opacities, which may represent atelectasis, aspiration, and/or pneumonia. Electronically Signed   By: Feliberto Harts MD   On: 03/02/2020 08:20   DG Abd Portable 1V-Small Bowel Obstruction Protocol-initial, 8 hr delay  Result Date: 03/01/2020 CLINICAL DATA:  74 year old female with small bowel obstruction. 8 hour delayed image. EXAM: PORTABLE ABDOMEN - 1 VIEW COMPARISON:  CT of the abdomen pelvis dated  02/28/2020 abdominal radiograph dated 03/01/2020 FINDINGS: Enteric tube with tip in the distal  stomach. Improved dilatation of the small bowel compared to the prior radiograph now measuring up to 3 cm in the left lower abdomen. No other interval change. IMPRESSION: Improved dilatation of the small bowel compared to prior radiograph. Continued follow-up recommended. Electronically Signed   By: Elgie Collard M.D.   On: 03/01/2020 22:29    Anti-infectives: Anti-infectives (From admission, onward)   Start     Dose/Rate Route Frequency Ordered Stop   03/02/20 1830  Ampicillin-Sulbactam (UNASYN) 3 g in sodium chloride 0.9 % 100 mL IVPB        3 g 200 mL/hr over 30 Minutes Intravenous Every 6 hours 03/02/20 1804     02/29/20 2200  vancomycin (VANCOREADY) IVPB 500 mg/100 mL  Status:  Discontinued        500 mg 100 mL/hr over 60 Minutes Intravenous Every 24 hours 02/29/20 0851 02/29/20 1001   02/29/20 1400  piperacillin-tazobactam (ZOSYN) IVPB 3.375 g  Status:  Discontinued        3.375 g 12.5 mL/hr over 240 Minutes Intravenous Every 8 hours 02/29/20 0855 02/29/20 1001   02/28/20 1900  vancomycin (VANCOCIN) IVPB 1000 mg/200 mL premix        1,000 mg 200 mL/hr over 60 Minutes Intravenous  Once 02/28/20 1825 02/29/20 0729   02/28/20 1845  piperacillin-tazobactam (ZOSYN) IVPB 3.375 g  Status:  Discontinued        3.375 g 12.5 mL/hr over 240 Minutes Intravenous Every 12 hours 02/28/20 1831 02/29/20 0855   02/28/20 1830  piperacillin-tazobactam (ZOSYN) IVPB 3.375 g  Status:  Discontinued        3.375 g 100 mL/hr over 30 Minutes Intravenous Every 12 hours 02/28/20 1825 02/28/20 1831   02/28/20 1825  vancomycin variable dose per unstable renal function (pharmacist dosing)  Status:  Discontinued         Does not apply See admin instructions 02/28/20 1825 02/29/20 0851   02/28/20 0930  cefTRIAXone (ROCEPHIN) 2 g in sodium chloride 0.9 % 100 mL IVPB        2 g 200 mL/hr over 30 Minutes Intravenous  Once 02/28/20 0920 02/28/20 1017   02/28/20 0930  metroNIDAZOLE (FLAGYL) IVPB 500 mg        500  mg 100 mL/hr over 60 Minutes Intravenous  Once 02/28/20 0920 02/28/20 1057      Assessment/Plan: Samantha Huerta is a 74 yo with SBO that has been improving. NG output a lot less. Concern for aspiration PNA on left. Abdomen improving. No rush to surgery given other issues with alcohol withdrawal and PNA.   NG and NPO until having bowel function  Discussed with Dr. Mariea Clonts.    LOS: 4 days    Samantha Huerta 03/03/2020

## 2020-03-03 NOTE — Progress Notes (Signed)
PHARMACY - TOTAL PARENTERAL NUTRITION CONSULT NOTE   Indication: Small bowel obstruction  Patient Measurements: Height: 5\' 5"  (165.1 cm) Weight: 45.6 kg (100 lb 8.5 oz) IBW/kg (Calculated) : 57 TPN AdjBW (KG): 44.9 Body mass index is 16.73 kg/m.   Assessment:  Severe Malnutrition due to small bowel obstruction with moderate.  Patient with concern of aspiration pneumonia and alcohol withdrawal.  Glucose / Insulin: stable- no insulin  Electrolytes: phos 5.0 on 12/20  K: 3.4  Na 134 Renal: Scr 0.69 LFTs / TGs: WNL Prealbumin / albumin: albumin 2.7 Intake / Output; MIVF:   Lactated Ringer @ 75 mL/hr   GI Imaging: CT abdomen/pelvis: High-grade SBO related to adhesions in the proximal ileum  Surgeries / Procedures:  12/20>> right IJ central venous catheter placed by Dr. 1/21 access: 12/20- Central line  TPN start date:  12/24  Nutritional Goals (per RD recommendation on 12/24): kCal: 1/25, Protein: 78-87 g, Fluid: >1300 mL/day Goal TPN rate is 80 mL/hr (provides 82 g of protein and 1370 kcals per day)  Due to shortage of lipids- goal lipid in TPN will be ~ 15% of kcals.   Current Nutrition:  NPO  Plan:  Start TPN at 40 mL/hr at 1800-  Electrolytes in TPN: 35mEq/L of Na, 53mEq/L of K, 63mEq/L of Ca, 12mEq/L of Mg, and 0 mmol/L of Phos. Cl:Ac ratio 1:1 Add standard MVI and trace elements to TPN Initiate Sensitive q8h SSI and adjust as needed  Reduce MIVF to 35 mL/hr at 1800 Monitor TPN labs on Mon/Thurs  4m, PharmD Clinical Pharmacist 03/03/2020 10:25 AM

## 2020-03-03 NOTE — Progress Notes (Signed)
Stopped levophed at 1420 as patient's blood pressures have been within goal range. Since stopping blood pressure's have been in the 80's systolic and 50's-60's diastolic with MAPs all above 60. Normal saline fluid bolus given at 1731. Blood pressure's are currently still in above listed range. Will continue to monitor and restart Levophed if pressure's drop lower.

## 2020-03-03 NOTE — Progress Notes (Addendum)
Initial Nutrition Assessment  DOCUMENTATION CODES:   Severe malnutrition in context of chronic illness  INTERVENTION:  TPN per pharmacy  Monitor magnesium, potassium, and phosphorus daily for at least 3 days, MD to replete as needed, as pt is at risk for refeeding syndrome given her malnourished state.  NUTRITION DIAGNOSIS:  Severe Malnutrition related to acute illness,chronic illness (SBO and chronic COPD) as evidenced by moderate and severe fat depletion,severe muscle depletion. Weight loss- 8 kg (15%) the past 8 months.  GOAL:  Patient will meet greater than or equal to 90% of their needs   -TPN being intiated  MONITOR:  PO intake,Supplement acceptance,Labs,Weight trends,Skin,I & O's  REASON FOR ASSESSMENT:   Consult,NPO/Clear Liquid Diet,Other (Comment) (New TPN/TNA)   ASSESSMENT: Patient is an underweight 74 yo female with history of COPD, GERD, HTN, HLD, daily alcohol and tobacco use. She presents from home with complaint of N/V and abdominal pain x 5 days. Hypovolemic shock, AKI, hypokalemia and small bowel obstruction. Receiving pressor support.   12/20- Central line placed. NGT placed right nare-bile, brown output 1.7 liters per nursing.  12/21 Patient discussed during rounds this morning. NPO currently. Patient limited her response to questions. Unable to provide details of diet history. No family present. Chart reviewed.  12/22 NG output high. CIWA protocol.  12/23-urinary retention-373 ml. Foley cath placed. Possible aspiration pna per MD note.  12/24 Patient still no BM- NG output 600 ml has decreased. TPN starting today.   Medications reviewed and include: Folvite, MVI,Thimaine  IV-Lactated ringers@ 75 ml/hr -d/c'd  Drips: Levophed   Labs: Sodium 134-improved,  potassium 3.4- upward trend    Intake/Output Summary (Last 24 hours) at 03/03/2020 1006 Last data filed at 03/03/2020 0600 Gross per 24 hour  Intake 1572.04 ml  Output 1415 ml  Net 157.04 ml    Patient weight has decreased ~ 2 kg since late September- not significant and down overall 8 kg / 15% since April 30th (8 months).   NUTRITION - FOCUSED PHYSICAL EXAM:  Flowsheet Row Most Recent Value  Orbital Region Moderate depletion  Upper Arm Region Severe depletion  Thoracic and Lumbar Region Severe depletion  Buccal Region Moderate depletion  Temple Region Mild depletion  Clavicle Bone Region Severe depletion  Clavicle and Acromion Bone Region Severe depletion  Scapular Bone Region Unable to assess  Dorsal Hand Mild depletion  Patellar Region Severe depletion  Anterior Thigh Region Severe depletion  Posterior Calf Region Severe depletion  Edema (RD Assessment) None  Hair Reviewed  Eyes Reviewed  Mouth Reviewed  Skin Reviewed  Nails Reviewed      Diet Order:   Diet Order            Diet NPO time specified  Diet effective now                 EDUCATION NEEDS:   Not appropriate for education at this time Skin:  Skin Assessment: Reviewed RN Assessment  Last BM:  12/15  Height:   Ht Readings from Last 1 Encounters:  02/28/20 5\' 5"  (1.651 m)    Weight:   Wt Readings from Last 1 Encounters:  03/03/20 45.6 kg    Ideal Body Weight:   57 kg  BMI:  Body mass index is 16.73 kg/m.  Estimated Nutritional Needs:   Kcal:  03/05/20  Protein:  78-87 gr  Fluid:  >1300 ml daily  [id number] MS,RD,CSG,LDN Pager: Royann Shivers

## 2020-03-04 DIAGNOSIS — K56609 Unspecified intestinal obstruction, unspecified as to partial versus complete obstruction: Secondary | ICD-10-CM | POA: Diagnosis not present

## 2020-03-04 LAB — CULTURE, BLOOD (ROUTINE X 2)
Culture: NO GROWTH
Culture: NO GROWTH

## 2020-03-04 LAB — COMPREHENSIVE METABOLIC PANEL
ALT: 29 U/L (ref 0–44)
AST: 30 U/L (ref 15–41)
Albumin: 2.6 g/dL — ABNORMAL LOW (ref 3.5–5.0)
Alkaline Phosphatase: 58 U/L (ref 38–126)
Anion gap: 13 (ref 5–15)
BUN: 21 mg/dL (ref 8–23)
CO2: 26 mmol/L (ref 22–32)
Calcium: 8.3 mg/dL — ABNORMAL LOW (ref 8.9–10.3)
Chloride: 98 mmol/L (ref 98–111)
Creatinine, Ser: 0.73 mg/dL (ref 0.44–1.00)
GFR, Estimated: >60 mL/min (ref >60)
Glucose, Bld: 112 mg/dL — ABNORMAL HIGH (ref 70–99)
Potassium: 3.1 mmol/L — ABNORMAL LOW (ref 3.5–5.1)
Sodium: 137 mmol/L (ref 135–145)
Total Bilirubin: 0.7 mg/dL (ref 0.3–1.2)
Total Protein: 5.5 g/dL — ABNORMAL LOW (ref 6.5–8.1)

## 2020-03-04 LAB — TSH: TSH: 5.007 u[IU]/mL — ABNORMAL HIGH (ref 0.350–4.500)

## 2020-03-04 LAB — DIFFERENTIAL
Band Neutrophils: 2 %
Basophils Absolute: 0 10*3/uL (ref 0.0–0.1)
Basophils Relative: 0 %
Eosinophils Absolute: 0 10*3/uL (ref 0.0–0.5)
Eosinophils Relative: 0 %
Lymphocytes Relative: 8 %
Lymphs Abs: 0.7 10*3/uL (ref 0.7–4.0)
Metamyelocytes Relative: 2 %
Monocytes Absolute: 1.2 10*3/uL — ABNORMAL HIGH (ref 0.1–1.0)
Monocytes Relative: 14 %
Myelocytes: 2 %
Neutro Abs: 6.4 10*3/uL (ref 1.7–7.7)
Neutrophils Relative %: 72 %

## 2020-03-04 LAB — CBC
HCT: 39.5 % (ref 36.0–46.0)
Hemoglobin: 13 g/dL (ref 12.0–15.0)
MCH: 33.3 pg (ref 26.0–34.0)
MCHC: 32.9 g/dL (ref 30.0–36.0)
MCV: 101.3 fL — ABNORMAL HIGH (ref 80.0–100.0)
Platelets: 342 10*3/uL (ref 150–400)
RBC: 3.9 MIL/uL (ref 3.87–5.11)
RDW: 13.1 % (ref 11.5–15.5)
WBC: 8.7 10*3/uL (ref 4.0–10.5)
nRBC: 0 % (ref 0.0–0.2)

## 2020-03-04 LAB — MAGNESIUM: Magnesium: 1.5 mg/dL — ABNORMAL LOW (ref 1.7–2.4)

## 2020-03-04 LAB — PREALBUMIN: Prealbumin: 9.9 mg/dL — ABNORMAL LOW (ref 18–38)

## 2020-03-04 LAB — GLUCOSE, CAPILLARY
Glucose-Capillary: 109 mg/dL — ABNORMAL HIGH (ref 70–99)
Glucose-Capillary: 147 mg/dL — ABNORMAL HIGH (ref 70–99)
Glucose-Capillary: 168 mg/dL — ABNORMAL HIGH (ref 70–99)
Glucose-Capillary: 198 mg/dL — ABNORMAL HIGH (ref 70–99)

## 2020-03-04 LAB — TRIGLYCERIDES: Triglycerides: 146 mg/dL (ref <150)

## 2020-03-04 LAB — LACTIC ACID, PLASMA: Lactic Acid, Venous: 2.6 mmol/L (ref 0.5–1.9)

## 2020-03-04 LAB — PHOSPHORUS: Phosphorus: 2.9 mg/dL (ref 2.5–4.6)

## 2020-03-04 MED ORDER — POTASSIUM CHLORIDE 10 MEQ/100ML IV SOLN
10.0000 meq | INTRAVENOUS | Status: AC
  Administered 2020-03-04 (×4): 10 meq via INTRAVENOUS
  Filled 2020-03-04 (×2): qty 100

## 2020-03-04 MED ORDER — TRAVASOL 10 % IV SOLN
INTRAVENOUS | Status: AC
  Filled 2020-03-04: qty 412.8

## 2020-03-04 MED ORDER — MAGNESIUM SULFATE 4 GM/100ML IV SOLN
4.0000 g | Freq: Once | INTRAVENOUS | Status: AC
  Administered 2020-03-04: 16:00:00 4 g via INTRAVENOUS
  Filled 2020-03-04: qty 100

## 2020-03-04 NOTE — Progress Notes (Signed)
PHARMACY - TOTAL PARENTERAL NUTRITION CONSULT NOTE   Indication: Small bowel obstruction  Patient Measurements: Height: 5\' 5"  (165.1 cm) Weight: 47 kg (103 lb 9.9 oz) IBW/kg (Calculated) : 57 TPN AdjBW (KG): 44.9 Body mass index is 17.24 kg/m.   Assessment:  Severe Malnutrition due to small bowel obstruction with moderate.  Patient with concern of aspiration pneumonia and alcohol withdrawal.  Glucose / Insulin: stable- no insulin  Electrolytes: phos 2.9   K: 3.4  Na 134 mag 1.5 Renal: Scr 0.69 LFTs / TGs: WNL Prealbumin / albumin: albumin 2.7 Intake / Output; MIVF:   Lactated Ringer @ 75 mL/hr   GI Imaging: CT abdomen/pelvis: High-grade SBO related to adhesions in the proximal ileum  Surgeries / Procedures:  12/20>> right IJ central venous catheter placed by Dr. access: 12/20- Central line  TPN start date:  12/24  Nutritional Goals (per RD recommendation on 12/24): kCal: 1/25, Protein: 78-87 g, Fluid: >1300 mL/day Goal TPN rate is 80 mL/hr (provides 82 g of protein and 1370 kcals per day)  Due to shortage of lipids- goal lipid in TPN will be ~ 15% of kcals.   Current Nutrition:  NPO  Plan:   RN never started TPN on 12/24- will start that TPN now 0900 and reorder same 1/2 rate TPN for tonight with minor adjustments based on labs.  Start TPN at 40 mL/hr at 1800-  Electrolytes in TPN: 54mEq/L of Na, 35mEq/L of K, 32mEq/L of Ca, 80mEq/L of Mg, and 15 mmol/L of Phos. Cl:Ac ratio 1:1 Add standard MVI, thiamine, folic acid, and trace elements to TPN Initiate Sensitive q8h SSI and adjust as needed  Reduce MIVF to 35 mL/hr at 1800 Monitor TPN labs on Mon/Thurs  4m, PharmD Clinical Pharmacist 03/04/2020 8:42 AM

## 2020-03-04 NOTE — Progress Notes (Signed)
[facility] Progress Note     Subjective: No new imaging this morning.  Hemodynamically, she has improved and is not requiring any vasopressor support.  She has required an increased dose of Precedex due to agitation.  NG output 400 cc, no bowel movement recorded.  Alcohol withdrawal and PNA treatment continuing.   Objective: Vital signs in last 24 hours: Temp:  [97.6 F (36.4 C)-98.4 F (36.9 C)] 97.6 F (36.4 C) (12/25 0845) Pulse Rate:  [56-97] 56 (12/25 1000) Resp:  [10-35] 19 (12/25 1000) BP: (86-116)/(49-82) 97/58 (12/25 1000) SpO2:  [94 %-100 %] 98 % (12/25 1000) Last BM Date: 02/23/20  Intake/Output from previous day: 12/24 0701 - 12/25 0700 In: 799.2 [I.V.:709.2; IV Piggyback:90] Out: 1050 [Urine:650; Emesis/NG output:400] Intake/Output this shift: Total I/O In: 581.4 [I.V.:255; IV Piggyback:326.4] Out: -   General appearance: Drowsy, arousable with touch to shoulder GI: soft, nondistended, nontender  Lab Results:  Recent Labs    03/02/20 0436 03/04/20 0426  WBC 17.2* 8.7  HGB 12.7 13.0  HCT 38.2 39.5  PLT 418* 342   BMET Recent Labs    03/02/20 0436 03/04/20 0426  NA 134* 137  K 3.4* 3.1*  CL 95* 98  CO2 26 26  GLUCOSE 121* 112*  BUN 24* 21  CREATININE 0.69 0.73  CALCIUM 8.6* 8.3*   PT/INR No results for input(s): LABPROT, INR in the last 72 hours.  Studies/Results: DG ABD ACUTE 2+V W 1V CHEST  Result Date: 03/03/2020 CLINICAL DATA:  Small-bowel obstruction, COPD, hypertension, smoker EXAM: DG ABDOMEN ACUTE WITH 1 VIEW CHEST COMPARISON:  03/02/2020 FINDINGS: Tip of RIGHT jugular line projects over SVC. Nasogastric tube extends into stomach. Normal heart size, mediastinal contours, and pulmonary vascularity. Severe aorta Emphysematous and bronchitic changes consistent with COPD. Atelectasis versus consolidation LEFT lower lobe slightly increased. Remaining lungs free of acute infiltrate. No pleural effusion or pneumothorax.  Single prominent small bowel loop in the mid abdomen. Questionable gastric wall thickening versus artifact from underdistention. No bowel wall thickening or free air. Multilevel degenerative disc disease changes lumbar spine. Degenerative changes RIGHT hip joint. Surgical clips in pelvis. IMPRESSION: COPD changes with atelectasis versus consolidation LEFT lower lobe. Single prominent small bowel loop in the mid abdomen with decreased small bowel distension since previous study. Questionable gastric wall thickening versus artifact from underdistention. Electronically Signed   By: Ulyses Southward M.D.   On: 03/03/2020 09:47    Anti-infectives: Anti-infectives (From admission, onward)   Start     Dose/Rate Route Frequency Ordered Stop   03/02/20 1830  Ampicillin-Sulbactam (UNASYN) 3 g in sodium chloride 0.9 % 100 mL IVPB        3 g 200 mL/hr over 30 Minutes Intravenous Every 6 hours 03/02/20 1804     02/29/20 2200  vancomycin (VANCOREADY) IVPB 500 mg/100 mL  Status:  Discontinued        500 mg 100 mL/hr over 60 Minutes Intravenous Every 24 hours 02/29/20 0851 02/29/20 1001   02/29/20 1400  piperacillin-tazobactam (ZOSYN) IVPB 3.375 g  Status:  Discontinued        3.375 g 12.5 mL/hr over 240 Minutes Intravenous Every 8 hours 02/29/20 0855 02/29/20 1001   02/28/20 1900  vancomycin (VANCOCIN) IVPB 1000 mg/200 mL premix        1,000 mg 200 mL/hr over 60 Minutes Intravenous  Once 02/28/20 1825 02/29/20 0729   02/28/20 1845  piperacillin-tazobactam (ZOSYN) IVPB 3.375 g  Status:  Discontinued  3.375 g 12.5 mL/hr over 240 Minutes Intravenous Every 12 hours 02/28/20 1831 02/29/20 0855   02/28/20 1830  piperacillin-tazobactam (ZOSYN) IVPB 3.375 g  Status:  Discontinued        3.375 g 100 mL/hr over 30 Minutes Intravenous Every 12 hours 02/28/20 1825 02/28/20 1831   02/28/20 1825  vancomycin variable dose per unstable renal function (pharmacist dosing)  Status:  Discontinued         Does not apply See  admin instructions 02/28/20 1825 02/29/20 0851   02/28/20 0930  cefTRIAXone (ROCEPHIN) 2 g in sodium chloride 0.9 % 100 mL IVPB        2 g 200 mL/hr over 30 Minutes Intravenous  Once 02/28/20 0920 02/28/20 1017   02/28/20 0930  metroNIDAZOLE (FLAGYL) IVPB 500 mg        500 mg 100 mL/hr over 60 Minutes Intravenous  Once 02/28/20 0920 02/28/20 1057      Assessment/Plan: Ms. Ryback is a 74 yo with SBO that has been improving. NG output continues to decrease. Concern for aspiration PNA on left. Abdomen improving. No rush to surgery given other issues with alcohol withdrawal and PNA.   NG and NPO until having bowel function  Discussed with Dr. Mariea Clonts.    Duanne Guess 03/04/2020

## 2020-03-04 NOTE — Progress Notes (Signed)
PROGRESS NOTE        PATIENT DETAILS Name: Samantha Huerta Age: 74 y.o. Sex: female Date of Birth: 1945/07/22 Admit Date: 02/28/2020 Admitting Physician [name] [name], MD MVH:QIONG, [name], FNP  Brief Narrative: Patient is a 74 y.o. female with PMHx of COPD, HTN, glaucoma, chronic back pain, tobacco use, alcohol abuse-presented with 5-day history of abdominal pain, vomiting.  She was found to have hypovolemic shock, AKI and high-grade bowel obstruction.  She was kept n.p.o.-NGT was placed-IV fluid resuscitation was started-she was subsequently admitted to the ICU.  Even with IV fluid resuscitation-she remained hypotensive-hence right IJ central venous access was obtained-and patient was started on pressors.  See below for further details.  Significant events: 12/20>> admit for hypovolemic shock, AKI in the setting of bowel obstruction. 12/20>> persistently hypotensive in spite of IVF resuscitation-right IJ placed by surgery-started on Levophed -TPN started on 03/04/2020  Significant studies: 12/20>> CT abdomen/pelvis: High-grade SBO related to adhesions in the proximal ileum 12/20>> chest x-ray: No pneumonia (personally reviewed) 12/21- abd xray with SBO  Antimicrobial therapy: Vancomycin: 12/20>> 12/21 Zosyn: 12/20>> 12/21 Ceftriaxone: 12/20 x 1 in ED Flagyl: 12/20 x 1 in ED -Unasyn started on 03/02/2020  Microbiology data: 12/20>> blood cultures: NGTD 03/01/20 Repeat Blood cx NGTD  Procedures : 12/20>> right IJ central venous catheter placed by Dr. Henreitta Leber  Consults: General surgery  DVT Prophylaxis : heparin injection 5,000 Units Start: 02/28/20 1400  Subjective:  Alcohol withdrawal symptoms/delirium tremens-persist - Son was at bedside  Having episodes of agitation , requiring iv precedex and iv lorazepam  -No BM -Still awaiting return of bowel function Levophed has been weaned off  Assessment/Plan: Hypovolemic shock: Due to  severe dehydration/vomiting-BP improving after initiation of Levophed- weaning down levophed--- down to -Unable to wean off Levophed completely at this time, would continue to try   Continue IVF- Random cortisol >100  -Empiric Vanco and Zosyn discontinued on 02/29/2020 -Repeat blood cultures requested on 03/01/20 due to worsening leukocytosis and persistent hypotension -IV Unasyn started on 03/02/2020 for aspiration pneumonia  -Presumed Aspiration Pneumonia--leukocytosis resolved, Unasyn as ordered  AKI: Hemodynamically mediated due to vomiting and lisinopril use.  UA without proteinuria, CT abdomen without hydronephrosis. -Creatinine was 2.0 on admission -AKI has resolved -renally adjust medications, avoid nephrotoxic agents / dehydration  / hypotension  -Urinary retention--patient failed voiding trial on 03/01/2020,  foley in situ--reinserted 03/02/2020 due to urinary retention  Hyponatremia/hypomagnesemia/hypokalemia: Mild: Due to hypovolemia-continue gentle IV fluid hydration- -replace and recheck electrolytes, -Monitor electrolytes carefully with TPN infusion  SBO: Remains n.p.o.-NG tube in place-no BM or flatus yet- -Small bowel follow-through on 03/01/2020 and subsequent serial x-rays demonstrates that SBO is Not worse,  -General surgeon Dr. Kristine Royal recommends conservative management, hold off on operative intervention at this time -Started IV TPN on 03/03/2020  COPD: Stable-continue bronchodilators.  Glaucoma: Continue eyedrops.  Tobacco abuse:use transdermal nicotine.  Alcohol abuse/delirium tremens: --Alcohol withdrawal symptoms/delirium tremens-worsened  -c/n Precedex drip at 1.4,  Plus c/n as needed Ativan PTA pt drank significant amount of vodka daily-claims last drink was on 12/18- --patient with delirium tremens, agitation and confusion --CIWA protocol. - Chronic back pain: On as needed narcotics  Urinary retention--  foley in situ--reinserted  03/02/2020 after patient failed voiding trial due to urinary retention  CRITICAL CARE Performed by: Shon Hale  Total critical care time:  46 minutes  Critical care time was exclusive of separately billable procedures and treating other patients.  --- Delirium tremens with persistent agitation and DTs requiring IV Precedex drip continuously  Critical care was necessary to treat or prevent imminent or life-threatening deterioration.  Critical care was time spent personally by me on the following activities: development of treatment plan with patient and/or surrogate as well as nursing, discussions with consultants, evaluation of patient's response to treatment, examination of patient, obtaining history from patient or surrogate, ordering and performing treatments and interventions, ordering and review of laboratory studies, ordering and review of radiographic studies, pulse oximetry and re-evaluation of patient's condition.   Diet: Diet Order            Diet NPO time specified  Diet effective now                  Code Status: Full code   Family Communication: Daughter-[name]- phone call 03/04/2020  Disposition Plan: Status is: Inpatient  Remains inpatient appropriate because:Inpatient level of care appropriate due to severity of illness -SBO, n.p.o., awaiting return of bowel function and tolerance of oral intake  Dispo: The patient is from: Home              Anticipated d/c is to: Home              Anticipated d/c date is: 3 days              Patient currently is not medically stable to d/c.   Barriers to Discharge: SBO, n.p.o., awaiting return of bowel function and tolerance of oral intake  Antimicrobial agents: Anti-infectives (From admission, onward)   Start     Dose/Rate Route Frequency Ordered Stop   03/02/20 1830  Ampicillin-Sulbactam (UNASYN) 3 g in sodium chloride 0.9 % 100 mL IVPB        3 g 200 mL/hr over 30 Minutes Intravenous Every  6 hours 03/02/20 1804     02/29/20 2200  vancomycin (VANCOREADY) IVPB 500 mg/100 mL  Status:  Discontinued        500 mg 100 mL/hr over 60 Minutes Intravenous Every 24 hours 02/29/20 0851 02/29/20 1001   02/29/20 1400  piperacillin-tazobactam (ZOSYN) IVPB 3.375 g  Status:  Discontinued        3.375 g 12.5 mL/hr over 240 Minutes Intravenous Every 8 hours 02/29/20 0855 02/29/20 1001   02/28/20 1900  vancomycin (VANCOCIN) IVPB 1000 mg/200 mL premix        1,000 mg 200 mL/hr over 60 Minutes Intravenous  Once 02/28/20 1825 02/29/20 0729   02/28/20 1845  piperacillin-tazobactam (ZOSYN) IVPB 3.375 g  Status:  Discontinued        3.375 g 12.5 mL/hr over 240 Minutes Intravenous Every 12 hours 02/28/20 1831 02/29/20 0855   02/28/20 1830  piperacillin-tazobactam (ZOSYN) IVPB 3.375 g  Status:  Discontinued        3.375 g 100 mL/hr over 30 Minutes Intravenous Every 12 hours 02/28/20 1825 02/28/20 1831   02/28/20 1825  vancomycin variable dose per unstable renal function (pharmacist dosing)  Status:  Discontinued         Does not apply See admin instructions 02/28/20 1825 02/29/20 0851   02/28/20 0930  cefTRIAXone (ROCEPHIN) 2 g in sodium chloride 0.9 % 100 mL IVPB        2 g 200 mL/hr over 30 Minutes Intravenous  Once 02/28/20 0920 02/28/20 1017   02/28/20 0930  metroNIDAZOLE (FLAGYL) IVPB 500 mg  500 mg 100 mL/hr over 60 Minutes Intravenous  Once 02/28/20 0920 02/28/20 1057       MEDICATIONS: Scheduled Meds: . arformoterol  15 mcg Nebulization BID  . bisacodyl  10 mg Rectal Once  . brinzolamide  1 drop Both Eyes BID  . chlorhexidine  15 mL Mouth Rinse BID  . Chlorhexidine Gluconate Cloth  6 each Topical Daily  . Chlorhexidine Gluconate Cloth  6 each Topical Q0600  . heparin  5,000 Units Subcutaneous Q8H  . insulin aspart  0-9 Units Subcutaneous Q8H  . latanoprost  1 drop Both Eyes QHS  . mouth rinse  15 mL Mouth Rinse q12n4p  . nicotine  14 mg Transdermal Daily  . pantoprazole  (PROTONIX) IV  40 mg Intravenous Q12H  . thiamine  100 mg Oral Daily  . umeclidinium bromide  1 puff Inhalation Daily   Continuous Infusions: . ampicillin-sulbactam (UNASYN) IV Stopped (03/04/20 1135)  . dexmedetomidine (PRECEDEX) IV infusion 1.2 mcg/kg/hr (03/04/20 1451)  . lactated ringers    . magnesium sulfate bolus IVPB    . norepinephrine (LEVOPHED) Adult infusion 0 mcg/min (03/04/20 0800)  . potassium chloride    . sodium chloride 250 mL (03/03/20 1731)  . TPN ADULT (ION) 40 mL/hr at 03/04/20 0847  . TPN ADULT (ION)     PRN Meds:.acetaminophen **OR** acetaminophen, albuterol, LORazepam, morphine injection, ondansetron **OR** ondansetron (ZOFRAN) IV, sodium chloride   PHYSICAL EXAM: Vital signs: Vitals:   03/04/20 1230 03/04/20 1300 03/04/20 1330 03/04/20 1400  BP: (!) 96/59 (!) 104/55 104/64 102/64  Pulse: (!) 57 (!) 57 (!) 56 60  Resp: 16 10 10 11   Temp:      TempSrc:      SpO2: 97% 97% 98% 93%  Weight:      Height:       Filed Weights   03/02/20 0500 03/03/20 0500 03/03/20 1003  Weight: 45.7 kg 45.6 kg 47 kg   Body mass index is 17.24 kg/m.   Gen Exam:Alert awake-not in any distress HEENT:atraumatic, normocephalic Nose - NG tube Neck -- Rt IJ central line Chest: Diminished in left base, no wheezing CVS:S1S2 regular Abdomen:soft , diminished bowel sounds, not significantly distended, not significantly tender Extremities:no edema, pedal pulses noted Neurology: Non focal, some tremors Psych-episodes of confusion and agitation persist Skin: no rash, warm/dry GU- foley in situ--reinserted 03/02/2020 due to urinary retention  I have personally reviewed following labs and imaging studies  LABORATORY DATA: CBC: Recent Labs  Lab 02/28/20 0913 02/28/20 2035 02/29/20 0429 03/01/20 0430 03/02/20 0436 03/04/20 0426  WBC 4.4 5.5 5.3 17.1* 17.2* 8.7  NEUTROABS 3.0  --   --   --  15.1* 6.4  HGB 15.2* 13.9 13.7 12.7 12.7 13.0  HCT 44.6 40.5 39.3 37.1 38.2  39.5  MCV 100.0 97.4 96.6 97.9 99.5 101.3*  PLT 522* 514* 498* 398 418* 342    Basic Metabolic Panel: Recent Labs  Lab 02/28/20 0957 02/28/20 2035 02/29/20 0429 02/29/20 1820 03/01/20 0430 03/02/20 0436 03/04/20 0426  NA  --  129* 129*  --  131* 134* 137  K  --  3.9 3.4*  --  3.0* 3.4* 3.1*  CL  --  90* 91*  --  92* 95* 98  CO2  --  24 26  --  28 26 26   GLUCOSE  --  111* 141*  --  91 121* 112*  BUN  --  86* 77*  --  41* 24* 21  CREATININE  --  1.73* 1.31* 0.76 0.62 0.69 0.73  CALCIUM  --  8.5* 8.3*  --  8.4* 8.6* 8.3*  MG 2.1 2.2 2.0  --   --  1.8 1.5*  PHOS  --  5.0*  --   --   --   --  2.9    GFR: Estimated Creatinine Clearance: 45.1 mL/min (by C-G formula based on SCr of 0.73 mg/dL).  Liver Function Tests: Recent Labs  Lab 02/28/20 2035 02/29/20 0429 03/01/20 0430 03/02/20 0436 03/04/20 0426  AST ALT ALKPHOS 35* 33* 41 50 58  BILITOT 0.8 0.6 1.0 0.8 0.7  PROT 5.5* 5.6* 5.3* 5.4* 5.5*  ALBUMIN 3.0* 3.1* 2.6* 2.7* 2.6*   Recent Labs  Lab 02/28/20 0913  LIPASE 36   No results for input(s): AMMONIA in the last 168 hours.  Coagulation Profile: Recent Labs  Lab 02/28/20 0913  INR 0.9    Cardiac Enzymes: No results for input(s): CKTOTAL, CKMB, CKMBINDEX, TROPONINI in the last 168 hours.  BNP (last 3 results) No results for input(s): PROBNP in the last 8760 hours.  Lipid Profile: Recent Labs    03/04/20 0427  TRIG 146    Thyroid Function Tests: No results for input(s): TSH, T4TOTAL, FREET4, T3FREE, THYROIDAB in the last 72 hours.  Anemia Panel: No results for input(s): VITAMINB12, FOLATE, FERRITIN, TIBC, IRON, RETICCTPCT in the last 72 hours.  Urine analysis:    Component Value Date/Time   COLORURINE AMBER (A) 02/28/2020 2200   APPEARANCEUR HAZY (A) 02/28/2020 2200   APPEARANCEUR Clear 03/20/2017 1112   LABSPEC 1.021 02/28/2020 2200   PHURINE 5.0 02/28/2020 2200   GLUCOSEU NEGATIVE 02/28/2020 2200    HGBUR NEGATIVE 02/28/2020 2200   BILIRUBINUR SMALL (A) 02/28/2020 2200   BILIRUBINUR Negative 03/20/2017 1112   KETONESUR 5 (A) 02/28/2020 2200   PROTEINUR NEGATIVE 02/28/2020 2200   UROBILINOGEN negative 01/25/2014 1529   NITRITE NEGATIVE 02/28/2020 2200   LEUKOCYTESUR NEGATIVE 02/28/2020 2200   Sepsis Labs: Lactic Acid, Venous    Component Value Date/Time   LATICACIDVEN 1.7 02/29/2020 0429    MICROBIOLOGY: Recent Results (from the past 240 hour(s))  Urine culture     Status: None   Collection Time: 02/28/20  9:13 AM   Specimen: In/Out Cath Urine  Result Value Ref Range Status   Specimen Description   Final    IN/OUT CATH URINE Performed at [facility], [address] [place], [place] [address]    Special Requests   Final    NONE Performed at [facility], [address] [place], [place] [address]    Culture   Final    NO GROWTH Performed at [facility], [address], [place], [place] [address]    Report Status 02/29/2020 FINAL  Final  Blood Culture (routine x 2)     Status: None   Collection Time: 02/28/20  9:14 AM   Specimen: BLOOD  Result Value Ref Range Status   Specimen Description BLOOD RIGHT ANTECUBITAL  Final   Special Requests   Final    BOTTLES DRAWN AEROBIC AND ANAEROBIC Blood Culture results may not be optimal due to an inadequate volume of blood received in culture bottles   Culture   Final    NO GROWTH 5 DAYS Performed at [facility], [address] [place], [place] [address]    Report Status 03/04/2020 FINAL  Final  Resp Panel by RT-PCR (Flu A&B, Covid) Nasopharyngeal  Swab     Status: None   Collection Time: 02/28/20  9:20 AM   Specimen: Nasopharyngeal Swab; Nasopharyngeal(NP) swabs in vial transport medium  Result Value Ref Range Status   SARS Coronavirus 2 by RT PCR NEGATIVE NEGATIVE Final    Comment: (NOTE) SARS-CoV-2 target nucleic acids are NOT DETECTED.  The SARS-CoV-2 RNA is generally detectable in upper respiratory specimens  during the acute phase of infection. The lowest concentration of SARS-CoV-2 viral copies this assay can detect is 138 copies/mL. A negative result does not preclude SARS-Cov-2 infection and should not be used as the sole basis for treatment or other patient management decisions. A negative result may occur with  improper specimen collection/handling, submission of specimen other than nasopharyngeal swab, presence of viral mutation(s) within the areas targeted by this assay, and inadequate number of viral copies(<138 copies/mL). A negative result must be combined with clinical observations, patient history, and epidemiological information. The expected result is Negative.  Fact Sheet for Patients:  BloggerCourse.com  Fact Sheet for Healthcare Providers:  SeriousBroker.it  This test is no t yet approved or cleared by the [place] FDA and  has been authorized for detection and/or diagnosis of SARS-CoV-2 by FDA under an Emergency Use Authorization (EUA). This EUA will remain  in effect (meaning this test can be used) for the duration of the COVID-19 declaration under Section 564(b)(1) of the Act, 21 U.S.C.section 360bbb-3(b)(1), unless the authorization is terminated  or revoked sooner.       Influenza A by PCR NEGATIVE NEGATIVE Final   Influenza B by PCR NEGATIVE NEGATIVE Final    Comment: (NOTE) The Xpert Xpress SARS-CoV-2/FLU/RSV plus assay is intended as an aid in the diagnosis of influenza from Nasopharyngeal swab specimens and should not be used as a sole basis for treatment. Nasal washings and aspirates are unacceptable for Xpert Xpress SARS-CoV-2/FLU/RSV testing.  Fact Sheet for Patients: BloggerCourse.com  Fact Sheet for Healthcare Providers: SeriousBroker.it  This test is not yet approved or cleared by the [place] FDA and has been authorized for detection  and/or diagnosis of SARS-CoV-2 by FDA under an Emergency Use Authorization (EUA). This EUA will remain in effect (meaning this test can be used) for the duration of the COVID-19 declaration under Section 564(b)(1) of the Act, 21 U.S.C. section 360bbb-3(b)(1), unless the authorization is terminated or revoked.  Performed at [facility], [address], [place], [place] [address]   Blood Culture (routine x 2)     Status: None   Collection Time: 02/28/20  9:57 AM   Specimen: BLOOD LEFT FOREARM  Result Value Ref Range Status   Specimen Description BLOOD LEFT FOREARM  Final   Special Requests   Final    BOTTLES DRAWN AEROBIC AND ANAEROBIC Blood Culture results may not be optimal due to an inadequate volume of blood received in culture bottles   Culture   Final    NO GROWTH 5 DAYS Performed at [facility], [address] [place], [place] [address]    Report Status 03/04/2020 FINAL  Final  MRSA PCR Screening     Status: Abnormal   Collection Time: 02/28/20  2:35 PM   Specimen: Nasopharyngeal  Result Value Ref Range Status   MRSA by PCR POSITIVE (A) NEGATIVE Final    Comment:        The GeneXpert MRSA Assay (FDA approved for NASAL specimens only), is one component of a comprehensive MRSA colonization surveillance program. It is not intended to diagnose MRSA infection nor  to guide or monitor treatment for MRSA infections. RESULT CALLED TO, READ BACK BY AND VERIFIED WITH: SHELTON,A AT 1632 BY HUFFINES,S ON 02/28/20. Performed at [facility], [address], [place], [place] [address]   Culture, blood (Routine X 2) w Reflex to ID Panel     Status: None (Preliminary result)   Collection Time: 03/01/20 11:33 AM   Specimen: BLOOD  Result Value Ref Range Status   Specimen Description BLOOD BLOOD RIGHT ARM  Final   Special Requests   Final    BOTTLES DRAWN AEROBIC AND ANAEROBIC Blood Culture adequate volume   Culture   Final    NO GROWTH 3 DAYS Performed at [facility], [address], [place], [place] [address]    Report Status PENDING  Incomplete  Culture, blood (Routine X 2) w Reflex to ID Panel     Status: None (Preliminary result)   Collection Time: 03/01/20 11:33 AM   Specimen: BLOOD  Result Value Ref Range Status   Specimen Description BLOOD BLOOD LEFT ARM  Final   Special Requests   Final    BOTTLES DRAWN AEROBIC AND ANAEROBIC Blood Culture results may not be optimal due to an inadequate volume of blood received in culture bottles   Culture   Final    NO GROWTH 3 DAYS Performed at [facility], [address] [place], [place] [address]    Report Status PENDING  Incomplete    RADIOLOGY STUDIES/RESULTS: DG ABD ACUTE 2+V W 1V CHEST  Result Date: 03/03/2020 CLINICAL DATA:  Small-bowel obstruction, COPD, hypertension, smoker EXAM: DG ABDOMEN ACUTE WITH 1 VIEW CHEST COMPARISON:  03/02/2020 FINDINGS: Tip of RIGHT jugular line projects over SVC. Nasogastric tube extends into stomach. Normal heart size, mediastinal contours, and pulmonary vascularity. Severe aorta Emphysematous and bronchitic changes consistent with COPD. Atelectasis versus consolidation LEFT lower lobe slightly increased. Remaining lungs free of acute infiltrate. No pleural effusion or pneumothorax. Single prominent small bowel loop in the mid abdomen. Questionable gastric wall thickening versus artifact from underdistention. No bowel wall thickening or free air. Multilevel degenerative disc disease changes lumbar spine. Degenerative changes RIGHT hip joint. Surgical clips in pelvis. IMPRESSION: COPD changes with atelectasis versus consolidation LEFT lower lobe. Single prominent small bowel loop in the mid abdomen with decreased small bowel distension since previous study. Questionable gastric wall thickening versus artifact from underdistention. Electronically Signed   By: Ulyses Southward M.D.   On: 03/03/2020 09:47    LOS: 5 days   Shon Hale, MD  [facility]  To contact the attending  provider between 7A-7P or the covering provider during after hours 7P-7A, please log into the web site [email or web address] and access using universal [place] password for that web site. If you do not have the password, please call the hospital operator.  03/04/2020, 3:12 PM

## 2020-03-05 ENCOUNTER — Inpatient Hospital Stay (HOSPITAL_COMMUNITY): Payer: Medicare Other

## 2020-03-05 DIAGNOSIS — K56609 Unspecified intestinal obstruction, unspecified as to partial versus complete obstruction: Secondary | ICD-10-CM | POA: Diagnosis not present

## 2020-03-05 LAB — MAGNESIUM: Magnesium: 2.2 mg/dL (ref 1.7–2.4)

## 2020-03-05 LAB — COMPREHENSIVE METABOLIC PANEL
ALT: 42 U/L (ref 0–44)
AST: 49 U/L — ABNORMAL HIGH (ref 15–41)
Albumin: 2.4 g/dL — ABNORMAL LOW (ref 3.5–5.0)
Alkaline Phosphatase: 51 U/L (ref 38–126)
Anion gap: 9 (ref 5–15)
BUN: 21 mg/dL (ref 8–23)
CO2: 22 mmol/L (ref 22–32)
Calcium: 8 mg/dL — ABNORMAL LOW (ref 8.9–10.3)
Chloride: 106 mmol/L (ref 98–111)
Creatinine, Ser: 0.58 mg/dL (ref 0.44–1.00)
GFR, Estimated: >60 mL/min (ref >60)
Glucose, Bld: 176 mg/dL — ABNORMAL HIGH (ref 70–99)
Potassium: 3.8 mmol/L (ref 3.5–5.1)
Sodium: 137 mmol/L (ref 135–145)
Total Bilirubin: 0.3 mg/dL (ref 0.3–1.2)
Total Protein: 5.2 g/dL — ABNORMAL LOW (ref 6.5–8.1)

## 2020-03-05 LAB — GLUCOSE, CAPILLARY
Glucose-Capillary: 134 mg/dL — ABNORMAL HIGH (ref 70–99)
Glucose-Capillary: 132 mg/dL — ABNORMAL HIGH (ref 70–99)
Glucose-Capillary: 126 mg/dL — ABNORMAL HIGH (ref 70–99)
Glucose-Capillary: 148 mg/dL — ABNORMAL HIGH (ref 70–99)
Glucose-Capillary: 133 mg/dL — ABNORMAL HIGH (ref 70–99)

## 2020-03-05 LAB — T4, FREE: Free T4: 0.93 ng/dL (ref 0.61–1.12)

## 2020-03-05 LAB — CORTISOL: Cortisol, Plasma: 24.4 ug/dL

## 2020-03-05 LAB — PHOSPHORUS: Phosphorus: 2.6 mg/dL (ref 2.5–4.6)

## 2020-03-05 LAB — LACTIC ACID, PLASMA: Lactic Acid, Venous: 3.1 mmol/L (ref 0.5–1.9)

## 2020-03-05 MED ORDER — LACTATED RINGERS IV BOLUS
500.0000 mL | Freq: Once | INTRAVENOUS | Status: AC
  Administered 2020-03-05: 05:00:00 500 mL via INTRAVENOUS

## 2020-03-05 MED ORDER — LORAZEPAM 2 MG/ML IJ SOLN
0.5000 mg | Freq: Three times a day (TID) | INTRAMUSCULAR | Status: DC | PRN
  Administered 2020-03-05 – 2020-03-09 (×8): 0.5 mg via INTRAVENOUS
  Filled 2020-03-05 (×9): qty 1

## 2020-03-05 MED ORDER — BISACODYL 10 MG RE SUPP
10.0000 mg | Freq: Once | RECTAL | Status: AC
  Administered 2020-03-05: 13:00:00 10 mg via RECTAL
  Filled 2020-03-05: qty 1

## 2020-03-05 MED ORDER — TRAVASOL 10 % IV SOLN
INTRAVENOUS | Status: AC
  Filled 2020-03-05: qty 825.6

## 2020-03-05 MED ORDER — INSULIN ASPART 100 UNIT/ML ~~LOC~~ SOLN
0.0000 [IU] | SUBCUTANEOUS | Status: DC
  Administered 2020-03-05 – 2020-03-06 (×6): 1 [IU] via SUBCUTANEOUS

## 2020-03-05 NOTE — Progress Notes (Signed)
Patient Demographics:    Samantha Huerta, is a 74 y.o. female, DOB - [birth year], WUJ:[id number]  Admit date - 02/28/2020   Admitting Physician Maretta Bees, MD  Outpatient Primary MD for the patient is [name], FNP  LOS - 6   Chief Complaint  Patient presents with   Nausea   Weakness        Subjective:    Samantha Huerta today has no fevers, no emesis,  No chest pain,  -Agitation and restlessness from DTs persist--requiring IV Precedex and lorazepam -Hemodynamic concerns persist, currently on IV Levophed, lactic acid was trending up, received IV fluid boluses -SBO appears to be improving, no BM however, but NG tube output is minimal -Called and updated patient's daughter  Assessment  & Plan :    Principal Problem:   SBO (small bowel obstruction) (HCC) Active Problems:   Hypertension   GERD (gastroesophageal reflux disease)   COPD (chronic obstructive pulmonary disease) with chronic bronchitis (HCC)   Glaucoma   Chronic midline low back pain without sciatica   AKI (acute kidney injury) (HCC)   Hypotension due to hypovolemia   Protein-calorie malnutrition, severe  Brief Narrative: Patient is a 74 y.o. female with PMHx of COPD, HTN, glaucoma, chronic back pain, tobacco use, alcohol abuse-presented with 5-day history of abdominal pain, vomiting.  She was found to have hypovolemic shock, AKI and high-grade bowel obstruction.  She was kept n.p.o.-NGT was placed-IV fluid resuscitation was started-she was subsequently admitted to the ICU.  Even with IV fluid resuscitation-she remained hypotensive-hence right IJ central venous access was obtained-and patient was started on pressors.  See below for further details    Assessment/Plan: Hypovolemic shock: Due to severe dehydration/vomiting- -patient developed worsening hypotension with increasing lactic acid levels, --Received IV fluid  boluses and currently on Levophed at 10 mcg  would continue to try to wean off Levophed   Continue IVF- Random cortisol >100  -Empiric Vanco and Zosyn discontinued on 02/29/2020 -Repeat blood cultures requested on 03/01/20 due to worsening leukocytosis and persistent hypotension -IV Unasyn started on 03/02/2020 for aspiration pneumonia   -Sepsis secondary to presumed Aspiration Pneumonia --leukocytosis resolved,  -Lactic acidosis noted -Continue  Iv Unasyn as above  AKI: Hemodynamically mediated due to vomiting and lisinopril use.  UA without proteinuria, CT abdomen without hydronephrosis. -Creatinine was 2.0 on admission -AKI has resolved -renally adjust medications, avoid nephrotoxic agents / dehydration  / hypotension  -Urinary retention--patient failed voiding trial on 03/01/2020,  foley in situ--reinserted 03/02/2020 due to urinary retention  Hyponatremia/hypomagnesemia/hypokalemia: Mild: Due to hypovolemia-continue gentle IV fluid hydration- -replace and recheck electrolytes, -Monitor electrolytes carefully with TPN infusion  SBO: -SBO appears to be improving, no BM however, but NG tube output is minimal -Follow-up x-rays without significant bowel dilatation -Small bowel follow-through on 03/01/2020 -General surgeon Dr. Pennie Rushing. Lady Gary recommends conservative management, hold off on operative intervention at this time -Started IV TPN on 03/03/2020 -Try to clamp NG tube and try liquid on 03/06/2019  COPD: Stable-continue bronchodilators.  Glaucoma: Continue eyedrops.  Tobacco abuse:use transdermal nicotine.  Alcohol abuse/delirium tremens: --Alcohol withdrawal symptoms/delirium tremens-worsened  -c/n Precedex drip at 1.4,  Plus c/n as needed Ativan PTA pt drank significant amount of vodka daily-claims last drink was on 12/18- --  patient with delirium tremens, agitation and confusion --CIWA protocol. - Chronic back pain: On as needed narcotics  Urinary  retention--  foley in situ--reinserted 03/02/2020 after patient failed voiding trial due to urinary retention  CRITICAL CARE Performed by: Shon Hale  Total critical care time: 42 minutes  Critical care time was exclusive of separately billable procedures and treating other patients.  --- Delirium tremens with persistent agitation and DTs requiring IV Precedex drip continuously -Hemodynamic instability with lactic acidosis in setting of sepsis from pneumonia requiring IV Levophed for pressure support  Critical care was necessary to treat or prevent imminent or life-threatening deterioration.  Critical care was time spent personally by me on the following activities: development of treatment plan with patient and/or surrogate as well as nursing, discussions with consultants, evaluation of patient's response to treatment, examination of patient, obtaining history from patient or surrogate, ordering and performing treatments and interventions, ordering and review of laboratory studies, ordering and review of radiographic studies, pulse oximetry and re-evaluation of patient's condition.   Diet Orders (From admission, onward)    Start     Ordered   03/05/20 1124  Diet clear liquid Room service appropriate? Yes; Fluid consistency: Thin  Diet effective now       Question Answer Comment  Room service appropriate? Yes   Fluid consistency: Thin      03/05/20 1123         Significant events: 12/20>> admit for hypovolemic shock, AKI in the setting of bowel obstruction. 12/20>> persistently hypotensive in spite of IVF resuscitation-right IJ placed by surgery-started on Levophed -TPN started on 03/04/2020  Significant studies: 12/20>> CT abdomen/pelvis: High-grade SBO related to adhesions in the proximal ileum 12/20>> chest x-ray: No pneumonia (personally reviewed) 12/21- abd xray with SBO  Antimicrobial therapy: Vancomycin: 12/20>> 12/21 Zosyn: 12/20>> 12/21 Ceftriaxone:  12/20 x 1 in ED Flagyl: 12/20 x 1 in ED -Unasyn started on 03/02/2020  Microbiology data: 12/20>> blood cultures: NGTD 03/01/20 Repeat Blood cx NGTD  Procedures : 12/20>> right IJ central venous catheter placed by Dr. Henreitta Leber  Consults: General surgery  DVT Prophylaxis : heparin injection 5,000 Units Start: 02/28/20 1400  Disposition/Need for in-Hospital Stay- patient unable to be discharged at this time due to -sepsis with hemodynamic instability requiring IV Levophed, IV fluids and IV antibiotics, SBO awaiting return of bowel function and tolerance of oral intake  Status is: Inpatient  Remains inpatient appropriate because:Please see above   Disposition: The patient is from: Home              Anticipated d/c is to: SNF              Anticipated d/c date is: > 3 days              Patient currently is not medically stable to d/c. Barriers: Not Clinically Stable-   Code Status : -  Code Status: Full Code   Family Communication:   Daughter-[name]-[phone]-  updated by phone call 03/05/2020  Consults  :  Gen surg  DVT Prophylaxis  :   - SCDs  heparin injection 5,000 Units Start: 02/28/20 1400  Lab Results  Component Value Date   PLT 342 03/04/2020    Inpatient Medications  Scheduled Meds:  arformoterol  15 mcg Nebulization BID   bisacodyl  10 mg Rectal Once   bisacodyl  10 mg Rectal Once   brinzolamide  1 drop Both Eyes BID   chlorhexidine  15 mL Mouth Rinse BID  Chlorhexidine Gluconate Cloth  6 each Topical Daily   heparin  5,000 Units Subcutaneous Q8H   insulin aspart  0-9 Units Subcutaneous Q4H   latanoprost  1 drop Both Eyes QHS   mouth rinse  15 mL Mouth Rinse q12n4p   nicotine  14 mg Transdermal Daily   pantoprazole (PROTONIX) IV  40 mg Intravenous Q12H   umeclidinium bromide  1 puff Inhalation Daily   Continuous Infusions:  ampicillin-sulbactam (UNASYN) IV 3 g (03/05/20 0559)   dexmedetomidine (PRECEDEX) IV infusion 1.4  mcg/kg/hr (03/05/20 40980832)   lactated ringers 35 mL/hr at 03/04/20 1836   norepinephrine (LEVOPHED) Adult infusion 6 mcg/min (03/05/20 1037)   sodium chloride 250 mL (03/03/20 1731)   TPN ADULT (ION) 40 mL/hr at 03/04/20 1844   TPN ADULT (ION)     PRN Meds:.acetaminophen **OR** acetaminophen, albuterol, LORazepam, morphine injection, ondansetron **OR** ondansetron (ZOFRAN) IV, sodium chloride   Anti-infectives (From admission, onward)   Start     Dose/Rate Route Frequency Ordered Stop   03/02/20 1830  Ampicillin-Sulbactam (UNASYN) 3 g in sodium chloride 0.9 % 100 mL IVPB        3 g 200 mL/hr over 30 Minutes Intravenous Every 6 hours 03/02/20 1804     02/29/20 2200  vancomycin (VANCOREADY) IVPB 500 mg/100 mL  Status:  Discontinued        500 mg 100 mL/hr over 60 Minutes Intravenous Every 24 hours 02/29/20 0851 02/29/20 1001   02/29/20 1400  piperacillin-tazobactam (ZOSYN) IVPB 3.375 g  Status:  Discontinued        3.375 g 12.5 mL/hr over 240 Minutes Intravenous Every 8 hours 02/29/20 0855 02/29/20 1001   02/28/20 1900  vancomycin (VANCOCIN) IVPB 1000 mg/200 mL premix        1,000 mg 200 mL/hr over 60 Minutes Intravenous  Once 02/28/20 1825 02/29/20 0729   02/28/20 1845  piperacillin-tazobactam (ZOSYN) IVPB 3.375 g  Status:  Discontinued        3.375 g 12.5 mL/hr over 240 Minutes Intravenous Every 12 hours 02/28/20 1831 02/29/20 0855   02/28/20 1830  piperacillin-tazobactam (ZOSYN) IVPB 3.375 g  Status:  Discontinued        3.375 g 100 mL/hr over 30 Minutes Intravenous Every 12 hours 02/28/20 1825 02/28/20 1831   02/28/20 1825  vancomycin variable dose per unstable renal function (pharmacist dosing)  Status:  Discontinued         Does not apply See admin instructions 02/28/20 1825 02/29/20 0851   02/28/20 0930  cefTRIAXone (ROCEPHIN) 2 g in sodium chloride 0.9 % 100 mL IVPB        2 g 200 mL/hr over 30 Minutes Intravenous  Once 02/28/20 0920 02/28/20 1017   02/28/20 0930   metroNIDAZOLE (FLAGYL) IVPB 500 mg        500 mg 100 mL/hr over 60 Minutes Intravenous  Once 02/28/20 0920 02/28/20 1057        Objective:   Vitals:   03/05/20 0900 03/05/20 1000 03/05/20 1015 03/05/20 1017  BP: 117/64 119/69 116/76   Pulse:  74    Resp: (!) 22 14 (!) 21   Temp:    98.6 F (37 C)  TempSrc:    Rectal  SpO2:  99%    Weight:      Height:        Wt Readings from Last 3 Encounters:  03/05/20 52.4 kg  02/01/20 46.7 kg  12/02/19 48.1 kg    Intake/Output Summary (Last 24 hours) at  03/05/2020 1124 Last data filed at 03/05/2020 0544 Gross per 24 hour  Intake 2099.29 ml  Output 150 ml  Net 1949.29 ml   Physical Exam Gen Exam:Alert awake-not in any distress HEENT:atraumatic, normocephalic Nose - NG tube- to be clamped Neck -- Rt IJ central line Chest: Diminished in left base, no wheezing CVS:S1S2 regular Abdomen:soft , improving bowel sounds,, not significantly distended, not significantly tender Extremities:no edema, pedal pulses noted Neurology: Non focal, some tremors Psych-episodes of confusion and agitation persist Skin: no rash, warm/dry GU- foley in situ--reinserted 03/02/2020 due to urinary retention   Data Review:   Micro Results Recent Results (from the past 240 hour(s))  Urine culture     Status: None   Collection Time: 02/28/20  9:13 AM   Specimen: In/Out Cath Urine  Result Value Ref Range Status   Specimen Description   Final    IN/OUT CATH URINE Performed at [facility], [address], [place], [place] [address]    Special Requests   Final    NONE Performed at [facility], [address] [place], [place] [address]    Culture   Final    NO GROWTH Performed at [facility], [address] [place], [place] [address]    Report Status 02/29/2020 FINAL  Final  Blood Culture (routine x 2)     Status: None   Collection Time: 02/28/20  9:14 AM   Specimen: BLOOD  Result Value Ref Range Status   Specimen Description BLOOD RIGHT  ANTECUBITAL  Final   Special Requests   Final    BOTTLES DRAWN AEROBIC AND ANAEROBIC Blood Culture results may not be optimal due to an inadequate volume of blood received in culture bottles   Culture   Final    NO GROWTH 5 DAYS Performed at [facility], [address] [place], [place] [address]    Report Status 03/04/2020 FINAL  Final  Resp Panel by RT-PCR (Flu A&B, Covid) Nasopharyngeal Swab     Status: None   Collection Time: 02/28/20  9:20 AM   Specimen: Nasopharyngeal Swab; Nasopharyngeal(NP) swabs in vial transport medium  Result Value Ref Range Status   SARS Coronavirus 2 by RT PCR NEGATIVE NEGATIVE Final    Comment: (NOTE) SARS-CoV-2 target nucleic acids are NOT DETECTED.  The SARS-CoV-2 RNA is generally detectable in upper respiratory specimens during the acute phase of infection. The lowest concentration of SARS-CoV-2 viral copies this assay can detect is 138 copies/mL. A negative result does not preclude SARS-Cov-2 infection and should not be used as the sole basis for treatment or other patient management decisions. A negative result may occur with  improper specimen collection/handling, submission of specimen other than nasopharyngeal swab, presence of viral mutation(s) within the areas targeted by this assay, and inadequate number of viral copies(<138 copies/mL). A negative result must be combined with clinical observations, patient history, and epidemiological information. The expected result is Negative.  Fact Sheet for Patients:  BloggerCourse.com  Fact Sheet for Healthcare Providers:  SeriousBroker.it  This test is no t yet approved or cleared by the [place] FDA and  has been authorized for detection and/or diagnosis of SARS-CoV-2 by FDA under an Emergency Use Authorization (EUA). This EUA will remain  in effect (meaning this test can be used) for the duration of the COVID-19 declaration under Section  564(b)(1) of the Act, 21 U.S.C.section 360bbb-3(b)(1), unless the authorization is terminated  or revoked sooner.       Influenza A by PCR  NEGATIVE NEGATIVE Final   Influenza B by PCR NEGATIVE NEGATIVE Final    Comment: (NOTE) The Xpert Xpress SARS-CoV-2/FLU/RSV plus assay is intended as an aid in the diagnosis of influenza from Nasopharyngeal swab specimens and should not be used as a sole basis for treatment. Nasal washings and aspirates are unacceptable for Xpert Xpress SARS-CoV-2/FLU/RSV testing.  Fact Sheet for Patients: BloggerCourse.com  Fact Sheet for Healthcare Providers: SeriousBroker.it  This test is not yet approved or cleared by the [place] FDA and has been authorized for detection and/or diagnosis of SARS-CoV-2 by FDA under an Emergency Use Authorization (EUA). This EUA will remain in effect (meaning this test can be used) for the duration of the COVID-19 declaration under Section 564(b)(1) of the Act, 21 U.S.C. section 360bbb-3(b)(1), unless the authorization is terminated or revoked.  Performed at [facility], [address] [place], [place] [address]   Blood Culture (routine x 2)     Status: None   Collection Time: 02/28/20  9:57 AM   Specimen: BLOOD LEFT FOREARM  Result Value Ref Range Status   Specimen Description BLOOD LEFT FOREARM  Final   Special Requests   Final    BOTTLES DRAWN AEROBIC AND ANAEROBIC Blood Culture results may not be optimal due to an inadequate volume of blood received in culture bottles   Culture   Final    NO GROWTH 5 DAYS Performed at [facility], [address], [place], [place] [address]    Report Status 03/04/2020 FINAL  Final  MRSA PCR Screening     Status: Abnormal   Collection Time: 02/28/20  2:35 PM   Specimen: Nasopharyngeal  Result Value Ref Range Status   MRSA by PCR POSITIVE (A) NEGATIVE Final    Comment:        The GeneXpert MRSA Assay (FDA approved for  NASAL specimens only), is one component of a comprehensive MRSA colonization surveillance program. It is not intended to diagnose MRSA infection nor to guide or monitor treatment for MRSA infections. RESULT CALLED TO, READ BACK BY AND VERIFIED WITH: SHELTON,A AT 1632 BY HUFFINES,S ON 02/28/20. Performed at [facility], [address], [place], [place] [address]   Culture, blood (Routine X 2) w Reflex to ID Panel     Status: None (Preliminary result)   Collection Time: 03/01/20 11:33 AM   Specimen: BLOOD  Result Value Ref Range Status   Specimen Description BLOOD BLOOD RIGHT ARM  Final   Special Requests   Final    BOTTLES DRAWN AEROBIC AND ANAEROBIC Blood Culture adequate volume   Culture   Final    NO GROWTH 3 DAYS Performed at [facility], [address], [place], [place] [address]    Report Status PENDING  Incomplete  Culture, blood (Routine X 2) w Reflex to ID Panel     Status: None (Preliminary result)   Collection Time: 03/01/20 11:33 AM   Specimen: BLOOD  Result Value Ref Range Status   Specimen Description BLOOD BLOOD LEFT ARM  Final   Special Requests   Final    BOTTLES DRAWN AEROBIC AND ANAEROBIC Blood Culture results may not be optimal due to an inadequate volume of blood received in culture bottles   Culture   Final    NO GROWTH 3 DAYS Performed at [facility], [address] [place], [place] [address]    Report Status PENDING  Incomplete    Radiology Reports CT ABDOMEN PELVIS WO CONTRAST  Result Date: 02/28/2020 CLINICAL DATA:  Nausea  and vomiting for several days EXAM: CT ABDOMEN AND PELVIS WITHOUT CONTRAST TECHNIQUE: Multidetector CT imaging of the abdomen and pelvis was performed following the standard protocol without IV contrast. COMPARISON:  None. FINDINGS: Lower chest: No acute abnormality. Hepatobiliary: No focal liver abnormality is seen. No gallstones, gallbladder wall thickening, or biliary dilatation. Pancreas: Unremarkable. No pancreatic ductal  dilatation or surrounding inflammatory changes. Spleen: Normal in size without focal abnormality. Adrenals/Urinary Tract: Adrenal glands are within normal limits. Kidneys are well visualized bilaterally. No renal calculi or obstructive changes are seen. Bladder is decompressed. Stomach/Bowel: Colon shows diverticular change without evidence of obstructive or inflammatory change. Colon is predominately decompressed although dense material is noted within the colon which may be related to ingested material. The appendix is not visualized and surgical clips are noted likely related to prior removal. The stomach is significantly distended with fluid as is the small bowel throughout the jejunum in into the proximal ileum. There is a transition zone identified in the mid abdomen best seen on image number 44 of series 2 and image number 41 of series 5. This is likely related to adhesions as no definitive mass is seen. The more distal small bowel is unremarkable. Vascular/Lymphatic: Aortic atherosclerosis. No enlarged abdominal or pelvic lymph nodes. Reproductive: Status post hysterectomy. No adnexal masses. Other: No abdominal wall hernia or abnormality. No abdominopelvic ascites. Musculoskeletal: Degenerative changes of lumbar spine are noted. No acute compression deformity is noted. IMPRESSION: High-grade small bowel obstruction likely related to adhesions in the proximal ileum. More distal small bowel and colon are decompressed. No other focal abnormality is noted. Electronically Signed   By: Alcide Clever M.D.   On: 02/28/2020 11:10   DG Abd 1 View  Result Date: 03/05/2020 CLINICAL DATA:  Small-bowel obstruction EXAM: ABDOMEN - 1 VIEW COMPARISON:  03/03/2000 FINDINGS: Nasogastric tube in the decompressed stomach. Small bowel and colon appear decompressed. Surgical clips in the right pelvis. Multilevel lumbar spondylitic change. IMPRESSION: Nasogastric tube in the stomach. Electronically Signed   By: Corlis Leak M.D.    On: 03/05/2020 08:17   DG Abd 1 View  Result Date: 03/01/2020 CLINICAL DATA:  Small bowel obstruction EXAM: ABDOMEN - 1 VIEW COMPARISON:  CT 2 days ago FINDINGS: Ongoing small bowel obstruction with dilated loops in the central abdomen measuring up to 3.8 cm. The degree of distension is improved, but no new colonic gas is seen. No gross pneumoperitoneum. Clear lung bases. IMPRESSION: Ongoing small bowel obstruction with improved small bowel distension. Electronically Signed   By: Marnee Spring M.D.   On: 03/01/2020 07:44   DG Chest Port 1 View  Result Date: 02/28/2020 CLINICAL DATA:  74 year old female status post central line placement. EXAM: PORTABLE CHEST 1 VIEW COMPARISON:  Chest radiograph dated 04/05/2016 and CT dated 07/08/2019. FINDINGS: Right IJ central venous line with tip over central SVC. Enteric tube with tip in the distal stomach. There is background of emphysema. No focal consolidation, pleural effusion or pneumothorax. The cardiac silhouette is within limits. Atherosclerotic calcification of the aorta. Osteopenia with degenerative changes of the spine and shoulders. Old healed right hip fractures. No acute osseous pathology. IMPRESSION: Right IJ central venous line with tip over central SVC. Electronically Signed   By: Elgie Collard M.D.   On: 02/28/2020 18:46   DG ABD ACUTE 2+V W 1V CHEST  Result Date: 03/03/2020 CLINICAL DATA:  Small-bowel obstruction, COPD, hypertension, smoker EXAM: DG ABDOMEN ACUTE WITH 1 VIEW CHEST COMPARISON:  03/02/2020 FINDINGS: Tip of RIGHT  jugular line projects over SVC. Nasogastric tube extends into stomach. Normal heart size, mediastinal contours, and pulmonary vascularity. Severe aorta Emphysematous and bronchitic changes consistent with COPD. Atelectasis versus consolidation LEFT lower lobe slightly increased. Remaining lungs free of acute infiltrate. No pleural effusion or pneumothorax. Single prominent small bowel loop in the mid abdomen.  Questionable gastric wall thickening versus artifact from underdistention. No bowel wall thickening or free air. Multilevel degenerative disc disease changes lumbar spine. Degenerative changes RIGHT hip joint. Surgical clips in pelvis. IMPRESSION: COPD changes with atelectasis versus consolidation LEFT lower lobe. Single prominent small bowel loop in the mid abdomen with decreased small bowel distension since previous study. Questionable gastric wall thickening versus artifact from underdistention. Electronically Signed   By: Ulyses Southward M.D.   On: 03/03/2020 09:47   DG ABD ACUTE 2+V W 1V CHEST  Result Date: 03/02/2020 CLINICAL DATA:  Small-bowel obstruction. EXAM: DG ABDOMEN ACUTE WITH 1 VIEW CHEST COMPARISON:  March 01, 2020. FINDINGS: Mild left basilar opacities with partial silhouetting of the left hemidiaphragm. No visible pleural effusions or pneumothorax. Stable cardiomediastinal silhouette. Enteric tube courses below the diaphragm with the tip in the distal stomach/peripyloric region, unchanged. Right central venous catheter with the tip projecting in the region of the superior cavoatrial junction. Slight increase in small bowel dilation with small bowel measuring up to 4 cm in the left lower abdomen. No other interval change. No gross pneumoperitoneum on this supine radiograph. Surgical clips projecting over the lower abdomen/pelvis. Polyarticular degenerative change. IMPRESSION: 1. Slight increase in small bowel dilation. 2. Mild left basilar opacities, which may represent atelectasis, aspiration, and/or pneumonia. Electronically Signed   By: Feliberto Harts MD   On: 03/02/2020 08:20   DG Abdomen Acute W/Chest  Result Date: 02/28/2020 CLINICAL DATA:  Abdominal pain, hypotension, vomiting EXAM: DG ABDOMEN ACUTE WITH 1 VIEW CHEST COMPARISON:  CT 07/08/2019 FINDINGS: Heart size and mediastinal contours are within normal limits. Aortic Atherosclerosis (ICD10-170.0). Lungs hyperinflated with  chronic coarse attenuated bronchovascular markings. No focal infiltrate. No free air. Gas distended small bowel loops in the lower abdomen, left greater than right, only slightly increased since previous abdominal radiograph 03/20/2017. Surgical clips in the lower pelvis. The colon is decompressed. There are no abnormal calcifications. Multilevel lumbar spondylitic change. No fracture or worrisome bone lesion. IMPRESSION: 1. Small bowel distention suggesting  ileus or obstruction. 2. No free air. Electronically Signed   By: Corlis Leak M.D.   On: 02/28/2020 09:46   DG Abd Portable 1V-Small Bowel Obstruction Protocol-initial, 8 hr delay  Result Date: 03/01/2020 CLINICAL DATA:  74 year old female with small bowel obstruction. 8 hour delayed image. EXAM: PORTABLE ABDOMEN - 1 VIEW COMPARISON:  CT of the abdomen pelvis dated 02/28/2020 abdominal radiograph dated 03/01/2020 FINDINGS: Enteric tube with tip in the distal stomach. Improved dilatation of the small bowel compared to the prior radiograph now measuring up to 3 cm in the left lower abdomen. No other interval change. IMPRESSION: Improved dilatation of the small bowel compared to prior radiograph. Continued follow-up recommended. Electronically Signed   By: Elgie Collard M.D.   On: 03/01/2020 22:29   MM 3D SCREEN BREAST BILATERAL  Result Date: 02/11/2020 CLINICAL DATA:  Screening. 17 pound weight loss since her most recent prior mammogram in 2020. EXAM: DIGITAL SCREENING BILATERAL MAMMOGRAM WITH TOMO AND CAD COMPARISON:  Previous exam(s). ACR Breast Density Category c: The breast tissue is heterogeneously dense, which may obscure small masses. FINDINGS: In the left breast, a possible asymmetry warrants  further evaluation. In the right breast, no findings suspicious for malignancy. Images were processed with CAD. IMPRESSION: Further evaluation is suggested for possible asymmetry in the left breast. RECOMMENDATION: Diagnostic mammogram and possibly  ultrasound of the left breast. (Code:[id number]) The patient will be contacted regarding the findings, and additional imaging will be scheduled. BI-RADS CATEGORY  0: Incomplete. Need additional imaging evaluation and/or prior mammograms for comparison. Electronically Signed   By: Hulan Saas M.D.   On: 02/11/2020 11:37   ECHOCARDIOGRAM COMPLETE  Result Date: 02/08/2020    ECHOCARDIOGRAM REPORT   Patient Name:   CHERL GORNEY Date of Exam: 02/08/2020 Medical Rec #:  [id number]        Height:       64.0 in Accession #:    [id number]       Weight:       103.0 lb Date of Birth:  June 14, 1945        BSA:          1.476 m Patient Age:    74 years         BP:           130/76 mmHg Patient Gender: F                HR:           96 bpm. [facility] Procedure: 2D Echo, Cardiac Doppler and Color Doppler Indications:    Dyspnea 786.09 / R06.00  History:        Patient has no prior history of Echocardiogram examinations.                 COPD; Risk Factors:Hypertension, Dyslipidemia and Current                 Smoker. GERD.  Sonographer:    Celesta Gentile RCS Referring Phys: [id number] SAMUEL G MCDOWELL IMPRESSIONS  1. Left ventricular ejection fraction, by estimation, is 55 to 60%. The left ventricle has normal function. The left ventricle has no regional wall motion abnormalities. Left ventricular diastolic parameters are indeterminate.  2. Right ventricular systolic function is normal. The right ventricular size is normal. Tricuspid regurgitation signal is inadequate for assessing PA pressure.  3. The mitral valve is abnormal, mildly thickened and calcified with restricted posterior leaflet motion. Trivial mitral valve regurgitation.  4. The aortic valve is tricuspid. Aortic valve regurgitation is mild.  5. The inferior vena cava is normal in size with greater than 50% respiratory variability, suggesting right atrial pressure of 3 mmHg. FINDINGS  Left Ventricle: Left ventricular ejection fraction, by  estimation, is 55 to 60%. The left ventricle has normal function. The left ventricle has no regional wall motion abnormalities. The left ventricular internal cavity size was normal in size. There is  borderline left ventricular hypertrophy. Left ventricular diastolic parameters are indeterminate. Right Ventricle: The right ventricular size is normal. No increase in right ventricular wall thickness. Right ventricular systolic function is normal. Tricuspid regurgitation signal is inadequate for assessing PA pressure. Left Atrium: Left atrial size was normal in size. Right Atrium: Right atrial size was normal in size. Pericardium: There is no evidence of pericardial effusion. Presence of pericardial fat pad. Mitral Valve: The mitral valve is abnormal. There is mild thickening of the mitral valve leaflet(s). There is mild calcification of the mitral valve leaflet(s). Trivial mitral valve regurgitation. Tricuspid Valve: The tricuspid valve is grossly normal. Tricuspid valve regurgitation is trivial. Aortic Valve: The aortic valve is tricuspid.  There is mild aortic valve annular calcification. Aortic valve regurgitation is mild. Aortic regurgitation PHT measures 321 msec. Pulmonic Valve: The pulmonic valve was not well visualized. Pulmonic valve regurgitation is not visualized. Aorta: The aortic root is normal in size and structure. Venous: The inferior vena cava is normal in size with greater than 50% respiratory variability, suggesting right atrial pressure of 3 mmHg. IAS/Shunts: No atrial level shunt detected by color flow Doppler.  LEFT VENTRICLE PLAX 2D LVIDd:         4.00 cm LVIDs:         2.75 cm LV PW:         0.85 cm LV IVS:        0.95 cm LVOT diam:     2.00 cm LV SV:         52 LV SV Index:   35 LVOT Area:     3.14 cm  RIGHT VENTRICLE RV S prime:     9.57 cm/s TAPSE (M-mode): 1.8 cm LEFT ATRIUM             Index       RIGHT ATRIUM          Index LA diam:        2.10 cm 1.42 cm/m  RA Area:     8.20 cm LA Vol  (A2C):   50.0 ml 33.88 ml/m RA Volume:   14.80 ml 10.03 ml/m LA Vol (A4C):   41.6 ml 28.19 ml/m LA Biplane Vol: 48.9 ml 33.14 ml/m  AORTIC VALVE LVOT Vmax:   89.40 cm/s LVOT Vmean:  54.300 cm/s LVOT VTI:    0.164 m AI PHT:      321 msec  AORTA Ao Root diam: 3.60 cm MITRAL VALVE MV Area (PHT): 4.06 cm     SHUNTS MV Decel Time: 187 msec     Systemic VTI:  0.16 m MV E velocity: 114.00 cm/s  Systemic Diam: 2.00 cm Nona Dell MD Electronically signed by Nona Dell MD Signature Date/Time: 02/08/2020/4:47:30 PM    Final    [place] EKG SITE RITE  Result Date: 02/28/2020 If Site Rite image not attached, placement could not be confirmed due to current cardiac rhythm.    CBC Recent Labs  Lab 02/28/20 0913 02/28/20 2035 02/29/20 0429 03/01/20 0430 03/02/20 0436 03/04/20 0426  WBC 4.4 5.5 5.3 17.1* 17.2* 8.7  HGB 15.2* 13.9 13.7 12.7 12.7 13.0  HCT 44.6 40.5 39.3 37.1 38.2 39.5  PLT 522* 514* 498* 398 418* 342  MCV 100.0 97.4 96.6 97.9 99.5 101.3*  MCH 34.1* 33.4 33.7 33.5 33.1 33.3  MCHC 34.1 34.3 34.9 34.2 33.2 32.9  RDW 12.6 12.6 12.4 12.5 12.7 13.1  LYMPHSABS 0.8  --   --   --  0.9 0.7  MONOABS 0.6  --   --   --  0.8 1.2*  EOSABS 0.0  --   --   --  0.0 0.0  BASOSABS 0.0  --   --   --  0.1 0.0    Chemistries  Recent Labs  Lab 02/28/20 2035 02/29/20 0429 02/29/20 1820 03/01/20 0430 03/02/20 0436 03/04/20 0426 03/05/20 0631  NA 129* 129*  --  131* 134* 137 137  K 3.9 3.4*  --  3.0* 3.4* 3.1* 3.8  CL 90* 91*  --  92* 95* 98 106  CO2 24 26  --  GLUCOSE 111* 141*  --  91 121* 112* 176*  BUN  86* 77*  --  41* 24* 21 21  CREATININE 1.73* 1.31* 0.76 0.62 0.69 0.73 0.58  CALCIUM 8.5* 8.3*  --  8.4* 8.6* 8.3* 8.0*  MG 2.2 2.0  --   --  1.8 1.5* 2.2  AST 23 25  --  20 30 30  49*  ALT 14 15  --  12 14 29  42  ALKPHOS 35* 33*  --  41 50 58 51  BILITOT 0.8 0.6  --  1.0 0.8 0.7 0.3    ------------------------------------------------------------------------------------------------------------------ Recent Labs    03/04/20 0427  TRIG 146    No results found for: HGBA1C ------------------------------------------------------------------------------------------------------------------ Recent Labs    03/04/20 2144  TSH 5.007*   Coagulation profile Recent Labs  Lab 02/28/20 0913  INR 0.9    No results for input(s): DDIMER in the last 72 hours.  Cardiac Enzymes No results for input(s): CKMB, TROPONINI, MYOGLOBIN in the last 168 hours.  Invalid input(s): CK  No results found for: BNP  Shon Hale M.D on 03/05/2020 at 11:24 AM  Go to [email or web address] - for contact info  [facility] - Office  [phone]

## 2020-03-05 NOTE — Progress Notes (Signed)
Per Dr. Marisa Severin order, NG tube was clamped at 1326 and I have started to slowly introduce liquids to the patient. She has taken a few small sips of water. Will continue to provide fluids as she awakens more.

## 2020-03-05 NOTE — Progress Notes (Signed)
Patient has been able to take small amounts of water today. Does not sip from straw so must be placed in mouth. She ate a few small bites of jello and popsicle for dinner and tolerated well. NG tube to be turned back on to intermittent suction at 11 pm tonight per Dr. Marisa Severin.

## 2020-03-05 NOTE — Progress Notes (Signed)
PHARMACY - TOTAL PARENTERAL NUTRITION CONSULT NOTE   Indication: Small bowel obstruction  Patient Measurements: Height: 5\' 5"  (165.1 cm) Weight: 52.4 kg (115 lb 8.3 oz) IBW/kg (Calculated) : 57 TPN AdjBW (KG): 44.9 Body mass index is 19.22 kg/m.   Assessment:  Severe Malnutrition due to small bowel obstruction with moderate.  Patient with concern of aspiration pneumonia and alcohol withdrawal.  Glucose / Insulin: 147-198- 2 units given Electrolytes: WNL  Renal: Scr 0.69 LFTs / TGs: WNL Prealbumin / albumin: albumin 2.4 Intake / Output; MIVF:   Lactated Ringer @ 35 mL/hr   GI Imaging: CT abdomen/pelvis: High-grade SBO related to adhesions in the proximal ileum  Surgeries / Procedures:  12/20>> right IJ central venous catheter placed by Dr. access: 12/20- Central line  TPN start date:  12/24  Nutritional Goals (per RD recommendation on 12/24): kCal: 1/25, Protein: 78-87 g, Fluid: >1300 mL/day Goal TPN rate is 80 mL/hr (provides 82 g of protein and 1370 kcals per day)  Due to shortage of lipids- goal lipid in TPN will be ~ 15% of kcals.   Current Nutrition:  NPO  Plan:  Increase TPN to 80 mL/hr at 1800-  Electrolytes in TPN: 15mEq/L of Na, 39mEq/L of K, 19mEq/L of Ca, 63mEq/L of Mg, and 15 mmol/L of Phos. Cl:Ac ratio 1:1 Add standard MVI, thiamine, folic acid, and trace elements to TPN Initiate Sensitive q4h SSI and adjust as needed  Discontinue MIVF at 1800 Monitor TPN labs on Mon/Thurs  4m, PharmD Clinical Pharmacist 03/05/2020 9:40 AM

## 2020-03-05 NOTE — Progress Notes (Signed)
[facility] night shift ICU coverage note.  The staff notified me that the patient's lactic acid level increased to 3.1 mmol/L from 2.6 mmol/L yesterday evening.  Vital signs have been stable.  A 500 mL LR bolus was ordered.  Sanda Klein, MD

## 2020-03-05 NOTE — Progress Notes (Signed)
Pharmacy Antibiotic Note  Samantha Huerta is a 74 y.o. female admitted on 02/28/2020 with SBO and hypovolemic shock secondary to GI loss due to vomiting; also with AKI, with Scr now back to BL. Pharmacy was consulted for Zosyn and vancomycin dosing for sepsis; pt rec'd 1 dose of vancomycin on 12/20 and Zosyn from 12/20-21, then empiric antibiotics were discontinued. Pharmacy is now consulted to dose Unasyn for aspiration pneumonia.   Plan: Continue unasyn 3000 mg IV every 6 hours. Monitor WBC, temp, clinical improvement, cultures, renal function  Height: 5\' 5"  (165.1 cm) Weight: 52.4 kg (115 lb 8.3 oz) IBW/kg (Calculated) : 57  Temp (24hrs), Avg:96.4 F (35.8 C), Min:93.6 F (34.2 C), Max:98.5 F (36.9 C)  Recent Labs  Lab 02/28/20 0913 02/28/20 1145 02/28/20 2035 02/29/20 0429 02/29/20 1820 03/01/20 0430 03/02/20 0436 03/04/20 0426 03/04/20 2143 03/05/20 0414 03/05/20 0631  WBC 4.4  --  5.5 5.3  --  17.1* 17.2* 8.7  --   --   --   CREATININE 2.02*  --  1.73* 1.31* 0.76 0.62 0.69 0.73  --   --  0.58  LATICACIDVEN 2.4* 3.5*  --  1.7  --   --   --   --  2.6* 3.1*  --     Estimated Creatinine Clearance: 50.3 mL/min (by C-G formula based on SCr of 0.58 mg/dL).    No Known Allergies  Antimicrobials this admission: 12/20 ceftriaxone X 1 12/20 metronidazole IV X 1 12/20 Zosyn >>12/21 12/20 Vancomycin X 1 Unasyn 12/23 >>  Microbiology results: 12/20 BCx X 2: NG 12/20 UCx: NG/final 12/22 BC X 2:  NGTD 12/20 MRSA PCR: positivie   Thank you for allowing pharmacy to be a part of this patient's care.  1/21, PharmD Clinical Pharmacist 03/05/2020 9:59 AM

## 2020-03-05 NOTE — Progress Notes (Signed)
[facility] Progress Note     Subjective: Abdominal plain film this morning shows no dilated loops of bowel, NG tube output was not recorded, but there is very little new in the canister.  No bowel movements recorded.  Overnight, she required resumption of vasopressor support.  She is currently on 10 mcg/kg/min of norepinephrine.  Lactic acid also increased this morning.  Alcohol withdrawal and PNA treatment continuing.   Objective: Vital signs in last 24 hours: Temp:  [93.6 F (34.2 C)-98.5 F (36.9 C)] 98.5 F (36.9 C) (12/26 0544) Pulse Rate:  [56-109] 73 (12/26 0200) Resp:  [10-30] 30 (12/26 0800) BP: (71-134)/(41-97) 134/90 (12/26 0800) SpO2:  [90 %-100 %] 98 % (12/26 0500) FiO2 (%):  [100 %] 100 % (12/25 1929) Weight:  [52.4 kg] 52.4 kg (12/26 0544) Last BM Date: 02/23/20  Intake/Output from previous day: 12/25 0701 - 12/26 0700 In: 2747 [I.V.:1480; IV Piggyback:1267] Out: 150 [Urine:150] Intake/Output this shift: No intake/output data recorded.  General appearance: Drowsy, opens eyes but is otherwise not interactive GI: soft, nondistended, nontender  Lab Results:  Recent Labs    03/04/20 0426  WBC 8.7  HGB 13.0  HCT 39.5  PLT 342   BMET Recent Labs    03/04/20 0426 03/05/20 0631  NA 137 137  K 3.1* 3.8  CL 98 106  CO2 26 22  GLUCOSE 112* 176*  BUN 21 21  CREATININE 0.73 0.58  CALCIUM 8.3* 8.0*   PT/INR No results for input(s): LABPROT, INR in the last 72 hours.  Studies/Results: DG Abd 1 View  Result Date: 03/05/2020 CLINICAL DATA:  Small-bowel obstruction EXAM: ABDOMEN - 1 VIEW COMPARISON:  03/03/2000 FINDINGS: Nasogastric tube in the decompressed stomach. Small bowel and colon appear decompressed. Surgical clips in the right pelvis. Multilevel lumbar spondylitic change. IMPRESSION: Nasogastric tube in the stomach. Electronically Signed   By: Corlis Leak M.D.   On: 03/05/2020 08:17    Anti-infectives: Anti-infectives (From  admission, onward)   Start     Dose/Rate Route Frequency Ordered Stop   03/02/20 1830  Ampicillin-Sulbactam (UNASYN) 3 g in sodium chloride 0.9 % 100 mL IVPB        3 g 200 mL/hr over 30 Minutes Intravenous Every 6 hours 03/02/20 1804     02/29/20 2200  vancomycin (VANCOREADY) IVPB 500 mg/100 mL  Status:  Discontinued        500 mg 100 mL/hr over 60 Minutes Intravenous Every 24 hours 02/29/20 0851 02/29/20 1001   02/29/20 1400  piperacillin-tazobactam (ZOSYN) IVPB 3.375 g  Status:  Discontinued        3.375 g 12.5 mL/hr over 240 Minutes Intravenous Every 8 hours 02/29/20 0855 02/29/20 1001   02/28/20 1900  vancomycin (VANCOCIN) IVPB 1000 mg/200 mL premix        1,000 mg 200 mL/hr over 60 Minutes Intravenous  Once 02/28/20 1825 02/29/20 0729   02/28/20 1845  piperacillin-tazobactam (ZOSYN) IVPB 3.375 g  Status:  Discontinued        3.375 g 12.5 mL/hr over 240 Minutes Intravenous Every 12 hours 02/28/20 1831 02/29/20 0855   02/28/20 1830  piperacillin-tazobactam (ZOSYN) IVPB 3.375 g  Status:  Discontinued        3.375 g 100 mL/hr over 30 Minutes Intravenous Every 12 hours 02/28/20 1825 02/28/20 1831   02/28/20 1825  vancomycin variable dose per unstable renal function (pharmacist dosing)  Status:  Discontinued         Does not apply See admin  instructions 02/28/20 1825 02/29/20 0851   02/28/20 0930  cefTRIAXone (ROCEPHIN) 2 g in sodium chloride 0.9 % 100 mL IVPB        2 g 200 mL/hr over 30 Minutes Intravenous  Once 02/28/20 0920 02/28/20 1017   02/28/20 0930  metroNIDAZOLE (FLAGYL) IVPB 500 mg        500 mg 100 mL/hr over 60 Minutes Intravenous  Once 02/28/20 0920 02/28/20 1057      Assessment/Plan: Samantha Huerta is a 74 yo with SBO that has been improving. NG output not recorded but appears to be fairly minimal since yesterday.  Abdominal exam benign.  No rush to surgery given other issues with alcohol withdrawal and PNA.   NG and NPO until having bowel function     Duanne Guess 03/05/2020

## 2020-03-06 ENCOUNTER — Encounter (HOSPITAL_COMMUNITY): Payer: Self-pay | Admitting: Internal Medicine

## 2020-03-06 ENCOUNTER — Other Ambulatory Visit (HOSPITAL_COMMUNITY): Payer: Medicare Other

## 2020-03-06 DIAGNOSIS — Z7189 Other specified counseling: Secondary | ICD-10-CM

## 2020-03-06 DIAGNOSIS — Z515 Encounter for palliative care: Secondary | ICD-10-CM | POA: Diagnosis not present

## 2020-03-06 DIAGNOSIS — K56609 Unspecified intestinal obstruction, unspecified as to partial versus complete obstruction: Secondary | ICD-10-CM | POA: Diagnosis not present

## 2020-03-06 LAB — CBC
HCT: 40.3 % (ref 36.0–46.0)
Hemoglobin: 13 g/dL (ref 12.0–15.0)
MCH: 33.8 pg (ref 26.0–34.0)
MCHC: 32.3 g/dL (ref 30.0–36.0)
MCV: 104.7 fL — ABNORMAL HIGH (ref 80.0–100.0)
Platelets: 273 10*3/uL (ref 150–400)
RBC: 3.85 MIL/uL — ABNORMAL LOW (ref 3.87–5.11)
RDW: 13.2 % (ref 11.5–15.5)
WBC: 13.4 10*3/uL — ABNORMAL HIGH (ref 4.0–10.5)
nRBC: 0.2 % (ref 0.0–0.2)

## 2020-03-06 LAB — DIFFERENTIAL
Abs Immature Granulocytes: 0.36 10*3/uL — ABNORMAL HIGH (ref 0.00–0.07)
Basophils Absolute: 0.1 10*3/uL (ref 0.0–0.1)
Basophils Relative: 0 %
Eosinophils Absolute: 0.1 10*3/uL (ref 0.0–0.5)
Eosinophils Relative: 1 %
Immature Granulocytes: 3 %
Lymphocytes Relative: 7 %
Lymphs Abs: 0.9 10*3/uL (ref 0.7–4.0)
Monocytes Absolute: 1.4 10*3/uL — ABNORMAL HIGH (ref 0.1–1.0)
Monocytes Relative: 11 %
Neutro Abs: 10.5 10*3/uL — ABNORMAL HIGH (ref 1.7–7.7)
Neutrophils Relative %: 78 %

## 2020-03-06 LAB — COMPREHENSIVE METABOLIC PANEL
ALT: 67 U/L — ABNORMAL HIGH (ref 0–44)
AST: 59 U/L — ABNORMAL HIGH (ref 15–41)
Albumin: 2.3 g/dL — ABNORMAL LOW (ref 3.5–5.0)
Alkaline Phosphatase: 58 U/L (ref 38–126)
Anion gap: 11 (ref 5–15)
BUN: 21 mg/dL (ref 8–23)
CO2: 19 mmol/L — ABNORMAL LOW (ref 22–32)
Calcium: 8.3 mg/dL — ABNORMAL LOW (ref 8.9–10.3)
Chloride: 107 mmol/L (ref 98–111)
Creatinine, Ser: 0.51 mg/dL (ref 0.44–1.00)
GFR, Estimated: >60 mL/min (ref >60)
Glucose, Bld: 122 mg/dL — ABNORMAL HIGH (ref 70–99)
Potassium: 3.4 mmol/L — ABNORMAL LOW (ref 3.5–5.1)
Sodium: 137 mmol/L (ref 135–145)
Total Bilirubin: 0.4 mg/dL (ref 0.3–1.2)
Total Protein: 5.2 g/dL — ABNORMAL LOW (ref 6.5–8.1)

## 2020-03-06 LAB — GLUCOSE, CAPILLARY
Glucose-Capillary: 113 mg/dL — ABNORMAL HIGH (ref 70–99)
Glucose-Capillary: 116 mg/dL — ABNORMAL HIGH (ref 70–99)
Glucose-Capillary: 134 mg/dL — ABNORMAL HIGH (ref 70–99)
Glucose-Capillary: 135 mg/dL — ABNORMAL HIGH (ref 70–99)
Glucose-Capillary: 142 mg/dL — ABNORMAL HIGH (ref 70–99)

## 2020-03-06 LAB — CULTURE, BLOOD (ROUTINE X 2)
Culture: NO GROWTH
Culture: NO GROWTH
Special Requests: ADEQUATE

## 2020-03-06 LAB — PREALBUMIN: Prealbumin: 10.7 mg/dL — ABNORMAL LOW (ref 18–38)

## 2020-03-06 LAB — PHOSPHORUS: Phosphorus: 3.4 mg/dL (ref 2.5–4.6)

## 2020-03-06 LAB — TRIGLYCERIDES: Triglycerides: 80 mg/dL (ref <150)

## 2020-03-06 LAB — MAGNESIUM: Magnesium: 1.7 mg/dL (ref 1.7–2.4)

## 2020-03-06 MED ORDER — INSULIN ASPART 100 UNIT/ML ~~LOC~~ SOLN
0.0000 [IU] | Freq: Four times a day (QID) | SUBCUTANEOUS | Status: DC
  Administered 2020-03-06 – 2020-03-07 (×2): 1 [IU] via SUBCUTANEOUS

## 2020-03-06 MED ORDER — TRAVASOL 10 % IV SOLN
INTRAVENOUS | Status: AC
  Filled 2020-03-06: qty 825.6

## 2020-03-06 MED ORDER — BOOST / RESOURCE BREEZE PO LIQD CUSTOM
1.0000 | Freq: Three times a day (TID) | ORAL | Status: DC
  Administered 2020-03-06: 13:00:00 1 via ORAL

## 2020-03-06 NOTE — Progress Notes (Addendum)
PHARMACY - TOTAL PARENTERAL NUTRITION CONSULT NOTE   Indication: Small bowel obstruction  Patient Measurements: Height: 5\' 5"  (165.1 cm) Weight: 55.7 kg (122 lb 12.7 oz) IBW/kg (Calculated) : 57 TPN AdjBW (KG): 44.9 Body mass index is 20.43 kg/m.   Assessment:  Severe Malnutrition due to small bowel obstruction with moderate.  Patient with concern of aspiration pneumonia and alcohol withdrawal.  Glucose / Insulin: 122-148- 6 units given past 24 hrs Electrolytes: K 3.4 Mag 1.7 Renal: WNL LFTs / TGs: WNL Prealbumin / albumin: albumin 2.3 Intake / Output; MIVF:    GI Imaging: CT abdomen/pelvis: High-grade SBO related to adhesions in the proximal ileum  Surgeries / Procedures:  12/20>> right IJ central venous catheter placed by Dr. access: 12/20- Central line  TPN start date:  12/24  Nutritional Goals (per RD recommendation on 12/24): kCal: 1/25, Protein: 78-87 g, Fluid: >1300 mL/day Goal TPN rate is 80 mL/hr (provides 82 g of protein and 1370 kcals per day)  Due to shortage of lipids- goal lipid in TPN will be ~ 15% of kcals.   Current Nutrition:  NPO  Plan:  Increase TPN to 80 mL/hr at 1800-  Electrolytes in TPN: 90mEq/L of Na, 98mEq/L of K, 50mEq/L of Ca, 62mEq/L of Mg, and 15 mmol/L of Phos. Cl:Ac ratio 1:1 Add standard MVI, thiamine, folic acid, and trace elements to TPN Initiate Sensitive q6h SSI and adjust as needed  Monitor TPN labs on Mon/Thurs  9m, PharmD Clinical Pharmacist 03/06/2020 10:23 AM

## 2020-03-06 NOTE — Progress Notes (Signed)
Patient Demographics:    Samantha Huerta, is a 74 y.o. female, DOB - 06-04-1945, ZCH:[id number]  Admit date - 02/28/2020   Admitting Physician [name] [name], MD  Outpatient Primary MD for the patient is [name], FNP  LOS - 7   Chief Complaint  Patient presents with  . Nausea  . Weakness        Subjective:    Samantha Huerta today has no fevers, no emesis,  No chest pain,   More awake -We will clamp NG tube and do trial of oral intake -BP improving we will  wean off Levophed  Assessment  & Plan :    Principal Problem:   SBO (small bowel obstruction) (HCC) Active Problems:   Hypertension   GERD (gastroesophageal reflux disease)   COPD (chronic obstructive pulmonary disease) with chronic bronchitis (HCC)   Glaucoma   Chronic midline low back pain without sciatica   AKI (acute kidney injury) (HCC)   Hypotension due to hypovolemia   Protein-calorie malnutrition, severe  Brief Narrative: Patient is a 74 y.o. female with PMHx of COPD, HTN, glaucoma, chronic back pain, tobacco use, alcohol abuse-presented with 5-day history of abdominal pain, vomiting.  She was found to have hypovolemic shock, AKI and high-grade bowel obstruction.  She was kept n.p.o.-NGT was placed-IV fluid resuscitation was started-she was subsequently admitted to the ICU.  Even with IV fluid resuscitation-she remained hypotensive-hence right IJ central venous access was obtained-and patient was started on pressors.   -Developed delirium tremens   Assessment/Plan: Hypovolemic shock: Due to severe dehydration/vomiting- -patient developed worsening hypotension with increasing lactic acid levels, --Received IV fluid boluses and   Levophed --BP improving Levophed weaned off on 03/06/2020   Continue IVF- Random cortisol >100  -Empiric Vanco and Zosyn discontinued on 02/29/2020 -Repeat blood cultures requested on  03/01/20 due to worsening leukocytosis and persistent hypotension -IV Unasyn started on 03/02/2020 for aspiration pneumonia   -Sepsis secondary to presumed Aspiration Pneumonia --leukocytosis resolved,  -Lactic acidosis noted -Continue  Iv Unasyn as above  AKI: Hemodynamically mediated due to vomiting and lisinopril use.  UA without proteinuria, CT abdomen without hydronephrosis. -Creatinine was 2.0 on admission -AKI has resolved -renally adjust medications, avoid nephrotoxic agents / dehydration  / hypotension  -Urinary retention--patient failed voiding trial on 03/01/2020,  foley in situ--reinserted 03/02/2020 due to urinary retention  Hyponatremia/hypomagnesemia/hypokalemia: Mild: Due to hypovolemia-continue gentle IV fluid hydration- -replace and recheck electrolytes, -Monitor electrolytes carefully with TPN infusion  SBO: -SBO appears to be improving, no BM however, but NG tube output is minimal -Follow-up x-rays without significant bowel dilatation -Small bowel follow-through on 03/01/2020 -General surgeon Dr. Pennie Rushing. Lady Gary recommends conservative management, hold off on operative intervention at this time -Started IV TPN on 03/03/2020 - clamped NG tube and continue trial of oral liquids  COPD: Stable-continue bronchodilators.  Glaucoma: Continue eyedrops.  Tobacco abuse:use transdermal nicotine.  Alcohol abuse/delirium tremens: --Alcohol withdrawal symptoms/delirium tremens-worsened  -c/n Precedex drip at 1.4,  Plus c/n as needed Ativan PTA pt drank significant amount of vodka daily-claims last drink was on 12/18- --patient with delirium tremens, agitation and confusion --CIWA protocol. - Chronic back pain: On as needed narcotics  Urinary retention--  foley in situ--reinserted 03/02/2020 after patient failed voiding trial  due to urinary retention  CRITICAL CARE Performed by: Shon Hale  Total critical care time: 41 minutes  Critical care  time was exclusive of separately billable procedures and treating other patients.  --- Delirium tremens with persistent agitation and DTs requiring IV Precedex drip continuously -Hemodynamic instability with lactic acidosis in setting of sepsis from pneumonia requiring IV Levophed for pressure support  Critical care was necessary to treat or prevent imminent or life-threatening deterioration.  Critical care was time spent personally by me on the following activities: development of treatment plan with patient and/or surrogate as well as nursing, discussions with consultants, evaluation of patient's response to treatment, examination of patient, obtaining history from patient or surrogate, ordering and performing treatments and interventions, ordering and review of laboratory studies, ordering and review of radiographic studies, pulse oximetry and re-evaluation of patient's condition.   Diet Orders (From admission, onward)    Start     Ordered   03/05/20 1124  Diet clear liquid Room service appropriate? Yes; Fluid consistency: Thin  Diet effective now       Question Answer Comment  Room service appropriate? Yes   Fluid consistency: Thin      03/05/20 1123         Significant events: 12/20>> admit for hypovolemic shock, AKI in the setting of bowel obstruction. 12/20>> persistently hypotensive in spite of IVF resuscitation-right IJ placed by surgery-started on Levophed -TPN started on 03/04/2020  Significant studies: 12/20>> CT abdomen/pelvis: High-grade SBO related to adhesions in the proximal ileum 12/20>> chest x-ray: No pneumonia (personally reviewed) 12/21- abd xray with SBO  Antimicrobial therapy: Vancomycin: 12/20>> 12/21 Zosyn: 12/20>> 12/21 Ceftriaxone: 12/20 x 1 in ED Flagyl: 12/20 x 1 in ED -Unasyn started on 03/02/2020  Microbiology data: 12/20>> blood cultures: NGTD 03/01/20 Repeat Blood cx NGTD  Procedures : 12/20>> right IJ central venous catheter placed  by Dr. Henreitta Leber  Consults: General surgery  DVT Prophylaxis : heparin injection 5,000 Units Start: 02/28/20 1400  Disposition/Need for in-Hospital Stay- patient unable to be discharged at this time due to -sepsis with hemodynamic instability requiring IV Levophed, IV fluids and IV antibiotics, SBO awaiting return of bowel function and tolerance of oral intake  Status is: Inpatient  Remains inpatient appropriate because:Please see above   Disposition: The patient is from: Home              Anticipated d/c is to: SNF              Anticipated d/c date is: > 3 days              Patient currently is not medically stable to d/c. Barriers: Not Clinically Stable-   Code Status : -  Code Status: Full Code   Family Communication:   Daughter-Samantha Huerta-[phone]-  updated by phone call 03/05/2020  Consults  :  Gen surg  DVT Prophylaxis  :   - SCDs  heparin injection 5,000 Units Start: 02/28/20 1400  Lab Results  Component Value Date   PLT 273 03/06/2020    Inpatient Medications  Scheduled Meds: . arformoterol  15 mcg Nebulization BID  . brinzolamide  1 drop Both Eyes BID  . chlorhexidine  15 mL Mouth Rinse BID  . Chlorhexidine Gluconate Cloth  6 each Topical Daily  . feeding supplement  1 Container Oral TID BM  . heparin  5,000 Units Subcutaneous Q8H  . insulin aspart  0-9 Units Subcutaneous Q6H  . latanoprost  1 drop Both Eyes QHS  .  mouth rinse  15 mL Mouth Rinse q12n4p  . nicotine  14 mg Transdermal Daily  . pantoprazole (PROTONIX) IV  40 mg Intravenous Q12H  . umeclidinium bromide  1 puff Inhalation Daily   Continuous Infusions: . ampicillin-sulbactam (UNASYN) IV 3 g (03/06/20 1158)  . dexmedetomidine (PRECEDEX) IV infusion 1.4 mcg/kg/hr (03/06/20 1046)  . norepinephrine (LEVOPHED) Adult infusion Stopped (03/06/20 0945)  . sodium chloride 250 mL (03/03/20 1731)  . TPN ADULT (ION) 80 mL/hr at 03/06/20 1040  . TPN ADULT (ION)     PRN Meds:.acetaminophen **OR**  acetaminophen, albuterol, LORazepam, morphine injection, ondansetron **OR** ondansetron (ZOFRAN) IV, sodium chloride   Anti-infectives (From admission, onward)   Start     Dose/Rate Route Frequency Ordered Stop   03/02/20 1830  Ampicillin-Sulbactam (UNASYN) 3 g in sodium chloride 0.9 % 100 mL IVPB        3 g 200 mL/hr over 30 Minutes Intravenous Every 6 hours 03/02/20 1804     02/29/20 2200  vancomycin (VANCOREADY) IVPB 500 mg/100 mL  Status:  Discontinued        500 mg 100 mL/hr over 60 Minutes Intravenous Every 24 hours 02/29/20 0851 02/29/20 1001   02/29/20 1400  piperacillin-tazobactam (ZOSYN) IVPB 3.375 g  Status:  Discontinued        3.375 g 12.5 mL/hr over 240 Minutes Intravenous Every 8 hours 02/29/20 0855 02/29/20 1001   02/28/20 1900  vancomycin (VANCOCIN) IVPB 1000 mg/200 mL premix        1,000 mg 200 mL/hr over 60 Minutes Intravenous  Once 02/28/20 1825 02/29/20 0729   02/28/20 1845  piperacillin-tazobactam (ZOSYN) IVPB 3.375 g  Status:  Discontinued        3.375 g 12.5 mL/hr over 240 Minutes Intravenous Every 12 hours 02/28/20 1831 02/29/20 0855   02/28/20 1830  piperacillin-tazobactam (ZOSYN) IVPB 3.375 g  Status:  Discontinued        3.375 g 100 mL/hr over 30 Minutes Intravenous Every 12 hours 02/28/20 1825 02/28/20 1831   02/28/20 1825  vancomycin variable dose per unstable renal function (pharmacist dosing)  Status:  Discontinued         Does not apply See admin instructions 02/28/20 1825 02/29/20 0851   02/28/20 0930  cefTRIAXone (ROCEPHIN) 2 g in sodium chloride 0.9 % 100 mL IVPB        2 g 200 mL/hr over 30 Minutes Intravenous  Once 02/28/20 0920 02/28/20 1017   02/28/20 0930  metroNIDAZOLE (FLAGYL) IVPB 500 mg        500 mg 100 mL/hr over 60 Minutes Intravenous  Once 02/28/20 0920 02/28/20 1057        Objective:   Vitals:   03/06/20 1130 03/06/20 1135 03/06/20 1145 03/06/20 1200  BP: (!) 90/53  (!) 88/47 114/71  Pulse: 70 71 70 95  Resp: 17 (!) 22 18 (!)  30  Temp:  (!) 97.3 F (36.3 C)    TempSrc:  Rectal    SpO2: 100% 100% 100% 100%  Weight:      Height:        Wt Readings from Last 3 Encounters:  03/06/20 55.7 kg  02/01/20 46.7 kg  12/02/19 48.1 kg    Intake/Output Summary (Last 24 hours) at 03/06/2020 1248 Last data filed at 03/06/2020 1200 Gross per 24 hour  Intake 3503.56 ml  Output 1600 ml  Net 1903.56 ml   Physical Exam Gen Exam:Alert awake-not in any distress HEENT:atraumatic, normocephalic Nose - NG tube-  clamped Neck --  Rt IJ central line Chest: Fair air movement, no wheezing CVS:S1S2 regular Abdomen:soft , ++bowel sounds,ND, not significantly tender Extremities:no edema, pedal pulses noted Neurology: Non focal, some tremors Psych-episodes of confusion and agitation persist Skin: no rash, warm/dry GU- foley in situ--reinserted 03/02/2020 due to urinary retention   Data Review:   Micro Results Recent Results (from the past 240 hour(s))  Urine culture     Status: None   Collection Time: 02/28/20  9:13 AM   Specimen: In/Out Cath Urine  Result Value Ref Range Status   Specimen Description   Final    IN/OUT CATH URINE Performed at [facility], [address] [place], [place] [address]    Special Requests   Final    NONE Performed at [facility], [address], [place], [place] [address]    Culture   Final    NO GROWTH Performed at [facility], [address] [place], [place] [address]    Report Status 02/29/2020 FINAL  Final  Blood Culture (routine x 2)     Status: None   Collection Time: 02/28/20  9:14 AM   Specimen: BLOOD  Result Value Ref Range Status   Specimen Description BLOOD RIGHT ANTECUBITAL  Final   Special Requests   Final    BOTTLES DRAWN AEROBIC AND ANAEROBIC Blood Culture results may not be optimal due to an inadequate volume of blood received in culture bottles   Culture   Final    NO GROWTH 5 DAYS Performed at [facility], [address], [place], [place] [address]     Report Status 03/04/2020 FINAL  Final  Resp Panel by RT-PCR (Flu A&B, Covid) Nasopharyngeal Swab     Status: None   Collection Time: 02/28/20  9:20 AM   Specimen: Nasopharyngeal Swab; Nasopharyngeal(NP) swabs in vial transport medium  Result Value Ref Range Status   SARS Coronavirus 2 by RT PCR NEGATIVE NEGATIVE Final    Comment: (NOTE) SARS-CoV-2 target nucleic acids are NOT DETECTED.  The SARS-CoV-2 RNA is generally detectable in upper respiratory specimens during the acute phase of infection. The lowest concentration of SARS-CoV-2 viral copies this assay can detect is 138 copies/mL. A negative result does not preclude SARS-Cov-2 infection and should not be used as the sole basis for treatment or other patient management decisions. A negative result may occur with  improper specimen collection/handling, submission of specimen other than nasopharyngeal swab, presence of viral mutation(s) within the areas targeted by this assay, and inadequate number of viral copies(<138 copies/mL). A negative result must be combined with clinical observations, patient history, and epidemiological information. The expected result is Negative.  Fact Sheet for Patients:  BloggerCourse.com  Fact Sheet for Healthcare Providers:  SeriousBroker.it  This test is no t yet approved or cleared by the [place] FDA and  has been authorized for detection and/or diagnosis of SARS-CoV-2 by FDA under an Emergency Use Authorization (EUA). This EUA will remain  in effect (meaning this test can be used) for the duration of the COVID-19 declaration under Section 564(b)(1) of the Act, 21 U.S.C.section 360bbb-3(b)(1), unless the authorization is terminated  or revoked sooner.       Influenza A by PCR NEGATIVE NEGATIVE Final   Influenza B by PCR NEGATIVE NEGATIVE Final    Comment: (NOTE) The Xpert Xpress SARS-CoV-2/FLU/RSV plus assay is intended as an aid in  the diagnosis of influenza from Nasopharyngeal swab specimens and should not be used as a sole basis for treatment. Nasal washings  and aspirates are unacceptable for Xpert Xpress SARS-CoV-2/FLU/RSV testing.  Fact Sheet for Patients: BloggerCourse.com  Fact Sheet for Healthcare Providers: SeriousBroker.it  This test is not yet approved or cleared by the [place] FDA and has been authorized for detection and/or diagnosis of SARS-CoV-2 by FDA under an Emergency Use Authorization (EUA). This EUA will remain in effect (meaning this test can be used) for the duration of the COVID-19 declaration under Section 564(b)(1) of the Act, 21 U.S.C. section 360bbb-3(b)(1), unless the authorization is terminated or revoked.  Performed at [facility], [address], [place], [place] [address]   Blood Culture (routine x 2)     Status: None   Collection Time: 02/28/20  9:57 AM   Specimen: BLOOD LEFT FOREARM  Result Value Ref Range Status   Specimen Description BLOOD LEFT FOREARM  Final   Special Requests   Final    BOTTLES DRAWN AEROBIC AND ANAEROBIC Blood Culture results may not be optimal due to an inadequate volume of blood received in culture bottles   Culture   Final    NO GROWTH 5 DAYS Performed at [facility], [address] [place], [place] [address]    Report Status 03/04/2020 FINAL  Final  MRSA PCR Screening     Status: Abnormal   Collection Time: 02/28/20  2:35 PM   Specimen: Nasopharyngeal  Result Value Ref Range Status   MRSA by PCR POSITIVE (A) NEGATIVE Final    Comment:        The GeneXpert MRSA Assay (FDA approved for NASAL specimens only), is one component of a comprehensive MRSA colonization surveillance program. It is not intended to diagnose MRSA infection nor to guide or monitor treatment for MRSA infections. RESULT CALLED TO, READ BACK BY AND VERIFIED WITH: SHELTON,A AT 1632 BY HUFFINES,S ON  02/28/20. Performed at [facility], [address], [place], [place] [address]   Culture, blood (Routine X 2) w Reflex to ID Panel     Status: None (Preliminary result)   Collection Time: 03/01/20 11:33 AM   Specimen: BLOOD  Result Value Ref Range Status   Specimen Description BLOOD BLOOD RIGHT ARM  Final   Special Requests   Final    BOTTLES DRAWN AEROBIC AND ANAEROBIC Blood Culture adequate volume   Culture   Final    NO GROWTH 4 DAYS Performed at [facility], [address], [place], [place] [address]    Report Status PENDING  Incomplete  Culture, blood (Routine X 2) w Reflex to ID Panel     Status: None (Preliminary result)   Collection Time: 03/01/20 11:33 AM   Specimen: BLOOD  Result Value Ref Range Status   Specimen Description BLOOD BLOOD LEFT ARM  Final   Special Requests   Final    BOTTLES DRAWN AEROBIC AND ANAEROBIC Blood Culture results may not be optimal due to an inadequate volume of blood received in culture bottles   Culture   Final    NO GROWTH 4 DAYS Performed at [facility], [address], [place], [place] [address]    Report Status PENDING  Incomplete    Radiology Reports CT ABDOMEN PELVIS WO CONTRAST  Result Date: 02/28/2020 CLINICAL DATA:  Nausea and vomiting for several days EXAM: CT ABDOMEN AND PELVIS WITHOUT CONTRAST TECHNIQUE: Multidetector CT imaging of the abdomen and pelvis was performed following the standard protocol without IV contrast. COMPARISON:  None. FINDINGS: Lower chest: No acute abnormality. Hepatobiliary: No focal liver abnormality is seen. No gallstones, gallbladder wall  thickening, or biliary dilatation. Pancreas: Unremarkable. No pancreatic ductal dilatation or surrounding inflammatory changes. Spleen: Normal in size without focal abnormality. Adrenals/Urinary Tract: Adrenal glands are within normal limits. Kidneys are well visualized bilaterally. No renal calculi or obstructive changes are seen. Bladder is decompressed. Stomach/Bowel:  Colon shows diverticular change without evidence of obstructive or inflammatory change. Colon is predominately decompressed although dense material is noted within the colon which may be related to ingested material. The appendix is not visualized and surgical clips are noted likely related to prior removal. The stomach is significantly distended with fluid as is the small bowel throughout the jejunum in into the proximal ileum. There is a transition zone identified in the mid abdomen best seen on image number 44 of series 2 and image number 41 of series 5. This is likely related to adhesions as no definitive mass is seen. The more distal small bowel is unremarkable. Vascular/Lymphatic: Aortic atherosclerosis. No enlarged abdominal or pelvic lymph nodes. Reproductive: Status post hysterectomy. No adnexal masses. Other: No abdominal wall hernia or abnormality. No abdominopelvic ascites. Musculoskeletal: Degenerative changes of lumbar spine are noted. No acute compression deformity is noted. IMPRESSION: High-grade small bowel obstruction likely related to adhesions in the proximal ileum. More distal small bowel and colon are decompressed. No other focal abnormality is noted. Electronically Signed   By: Alcide Clever M.D.   On: 02/28/2020 11:10   DG Abd 1 View  Result Date: 03/05/2020 CLINICAL DATA:  Small-bowel obstruction EXAM: ABDOMEN - 1 VIEW COMPARISON:  03/03/2000 FINDINGS: Nasogastric tube in the decompressed stomach. Small bowel and colon appear decompressed. Surgical clips in the right pelvis. Multilevel lumbar spondylitic change. IMPRESSION: Nasogastric tube in the stomach. Electronically Signed   By: Corlis Leak M.D.   On: 03/05/2020 08:17   DG Abd 1 View  Result Date: 03/01/2020 CLINICAL DATA:  Small bowel obstruction EXAM: ABDOMEN - 1 VIEW COMPARISON:  CT 2 days ago FINDINGS: Ongoing small bowel obstruction with dilated loops in the central abdomen measuring up to 3.8 cm. The degree of distension  is improved, but no new colonic gas is seen. No gross pneumoperitoneum. Clear lung bases. IMPRESSION: Ongoing small bowel obstruction with improved small bowel distension. Electronically Signed   By: Marnee Spring M.D.   On: 03/01/2020 07:44   DG Chest Port 1 View  Result Date: 02/28/2020 CLINICAL DATA:  73 year old female status post central line placement. EXAM: PORTABLE CHEST 1 VIEW COMPARISON:  Chest radiograph dated 04/05/2016 and CT dated 07/08/2019. FINDINGS: Right IJ central venous line with tip over central SVC. Enteric tube with tip in the distal stomach. There is background of emphysema. No focal consolidation, pleural effusion or pneumothorax. The cardiac silhouette is within limits. Atherosclerotic calcification of the aorta. Osteopenia with degenerative changes of the spine and shoulders. Old healed right hip fractures. No acute osseous pathology. IMPRESSION: Right IJ central venous line with tip over central SVC. Electronically Signed   By: Elgie Collard M.D.   On: 02/28/2020 18:46   DG ABD ACUTE 2+V W 1V CHEST  Result Date: 03/05/2020 CLINICAL DATA:  Small-bowel obstruction, nausea, weakness EXAM: DG ABDOMEN ACUTE WITH 1 VIEW CHEST COMPARISON:  Earlier abdominal radiograph 0800 hours, chest radiograph 02/29/2020 FINDINGS: Nasogastric tube extends into stomach. RIGHT jugular line tip projecting over SVC. Normal heart size, mediastinal contours, and pulmonary vascularity. Atherosclerotic calcification aorta. Atelectasis versus consolidation LEFT lower lobe new since prior study. Small LEFT pleural effusion. Underlying emphysematous changes. No pneumothorax. Nonobstructive bowel gas pattern. No  bowel dilatation or wall thickening. No free air. Bones demineralized with BILATERAL chronic rotator cuff tears and degenerative disc disease changes of the thoracic/lumbar spine. IMPRESSION: Nonobstructive bowel gas pattern. Atelectasis versus consolidation LEFT lower lobe new since 02/28/2020  with small associated LEFT pleural effusion. Electronically Signed   By: Ulyses SouthwardMark  Boles M.D.   On: 03/05/2020 13:04   DG ABD ACUTE 2+V W 1V CHEST  Result Date: 03/03/2020 CLINICAL DATA:  Small-bowel obstruction, COPD, hypertension, smoker EXAM: DG ABDOMEN ACUTE WITH 1 VIEW CHEST COMPARISON:  03/02/2020 FINDINGS: Tip of RIGHT jugular line projects over SVC. Nasogastric tube extends into stomach. Normal heart size, mediastinal contours, and pulmonary vascularity. Severe aorta Emphysematous and bronchitic changes consistent with COPD. Atelectasis versus consolidation LEFT lower lobe slightly increased. Remaining lungs free of acute infiltrate. No pleural effusion or pneumothorax. Single prominent small bowel loop in the mid abdomen. Questionable gastric wall thickening versus artifact from underdistention. No bowel wall thickening or free air. Multilevel degenerative disc disease changes lumbar spine. Degenerative changes RIGHT hip joint. Surgical clips in pelvis. IMPRESSION: COPD changes with atelectasis versus consolidation LEFT lower lobe. Single prominent small bowel loop in the mid abdomen with decreased small bowel distension since previous study. Questionable gastric wall thickening versus artifact from underdistention. Electronically Signed   By: Ulyses SouthwardMark  Boles M.D.   On: 03/03/2020 09:47   DG ABD ACUTE 2+V W 1V CHEST  Result Date: 03/02/2020 CLINICAL DATA:  Small-bowel obstruction. EXAM: DG ABDOMEN ACUTE WITH 1 VIEW CHEST COMPARISON:  March 01, 2020. FINDINGS: Mild left basilar opacities with partial silhouetting of the left hemidiaphragm. No visible pleural effusions or pneumothorax. Stable cardiomediastinal silhouette. Enteric tube courses below the diaphragm with the tip in the distal stomach/peripyloric region, unchanged. Right central venous catheter with the tip projecting in the region of the superior cavoatrial junction. Slight increase in small bowel dilation with small bowel measuring up to 4  cm in the left lower abdomen. No other interval change. No gross pneumoperitoneum on this supine radiograph. Surgical clips projecting over the lower abdomen/pelvis. Polyarticular degenerative change. IMPRESSION: 1. Slight increase in small bowel dilation. 2. Mild left basilar opacities, which may represent atelectasis, aspiration, and/or pneumonia. Electronically Signed   By: Feliberto HartsFrederick S Jones MD   On: 03/02/2020 08:20   DG Abdomen Acute W/Chest  Result Date: 02/28/2020 CLINICAL DATA:  Abdominal pain, hypotension, vomiting EXAM: DG ABDOMEN ACUTE WITH 1 VIEW CHEST COMPARISON:  CT 07/08/2019 FINDINGS: Heart size and mediastinal contours are within normal limits. Aortic Atherosclerosis (ICD10-170.0). Lungs hyperinflated with chronic coarse attenuated bronchovascular markings. No focal infiltrate. No free air. Gas distended small bowel loops in the lower abdomen, left greater than right, only slightly increased since previous abdominal radiograph 03/20/2017. Surgical clips in the lower pelvis. The colon is decompressed. There are no abnormal calcifications. Multilevel lumbar spondylitic change. No fracture or worrisome bone lesion. IMPRESSION: 1. Small bowel distention suggesting  ileus or obstruction. 2. No free air. Electronically Signed   By: Corlis Leak  Hassell M.D.   On: 02/28/2020 09:46   DG Abd Portable 1V-Small Bowel Obstruction Protocol-initial, 8 hr delay  Result Date: 03/01/2020 CLINICAL DATA:  74 year old female with small bowel obstruction. 8 hour delayed image. EXAM: PORTABLE ABDOMEN - 1 VIEW COMPARISON:  CT of the abdomen pelvis dated 02/28/2020 abdominal radiograph dated 03/01/2020 FINDINGS: Enteric tube with tip in the distal stomach. Improved dilatation of the small bowel compared to the prior radiograph now measuring up to 3 cm in the left lower abdomen. No other  interval change. IMPRESSION: Improved dilatation of the small bowel compared to prior radiograph. Continued follow-up recommended.  Electronically Signed   By: Elgie Collard M.D.   On: 03/01/2020 22:29   MM 3D SCREEN BREAST BILATERAL  Result Date: 02/11/2020 CLINICAL DATA:  Screening. 17 pound weight loss since her most recent prior mammogram in 2020. EXAM: DIGITAL SCREENING BILATERAL MAMMOGRAM WITH TOMO AND CAD COMPARISON:  Previous exam(s). ACR Breast Density Category c: The breast tissue is heterogeneously dense, which may obscure small masses. FINDINGS: In the left breast, a possible asymmetry warrants further evaluation. In the right breast, no findings suspicious for malignancy. Images were processed with CAD. IMPRESSION: Further evaluation is suggested for possible asymmetry in the left breast. RECOMMENDATION: Diagnostic mammogram and possibly ultrasound of the left breast. (Code:[id number]) The patient will be contacted regarding the findings, and additional imaging will be scheduled. BI-RADS CATEGORY  0: Incomplete. Need additional imaging evaluation and/or prior mammograms for comparison. Electronically Signed   By: Hulan Saas M.D.   On: 02/11/2020 11:37   ECHOCARDIOGRAM COMPLETE  Result Date: 02/08/2020    ECHOCARDIOGRAM REPORT   Patient Name:   MARGUERITA STAPP Date of Exam: 02/08/2020 Medical Rec #:  [id number]        Height:       64.0 in Accession #:    [id number]       Weight:       103.0 lb Date of Birth:  1945-05-27        BSA:          1.476 m Patient Age:    74 years         BP:           130/76 mmHg Patient Gender: F                HR:           96 bpm. [facility] Procedure: 2D Echo, Cardiac Doppler and Color Doppler Indications:    Dyspnea 786.09 / R06.00  History:        Patient has no prior history of Echocardiogram examinations.                 COPD; Risk Factors:Hypertension, Dyslipidemia and Current                 Smoker. GERD.  Sonographer:    Celesta Gentile RCS Referring Phys: [phone] SAMUEL G MCDOWELL IMPRESSIONS  1. Left ventricular ejection fraction, by estimation, is 55 to 60%. The  left ventricle has normal function. The left ventricle has no regional wall motion abnormalities. Left ventricular diastolic parameters are indeterminate.  2. Right ventricular systolic function is normal. The right ventricular size is normal. Tricuspid regurgitation signal is inadequate for assessing PA pressure.  3. The mitral valve is abnormal, mildly thickened and calcified with restricted posterior leaflet motion. Trivial mitral valve regurgitation.  4. The aortic valve is tricuspid. Aortic valve regurgitation is mild.  5. The inferior vena cava is normal in size with greater than 50% respiratory variability, suggesting right atrial pressure of 3 mmHg. FINDINGS  Left Ventricle: Left ventricular ejection fraction, by estimation, is 55 to 60%. The left ventricle has normal function. The left ventricle has no regional wall motion abnormalities. The left ventricular internal cavity size was normal in size. There is  borderline left ventricular hypertrophy. Left ventricular diastolic parameters are indeterminate. Right Ventricle: The right ventricular size is normal. No increase in right ventricular wall thickness.  Right ventricular systolic function is normal. Tricuspid regurgitation signal is inadequate for assessing PA pressure. Left Atrium: Left atrial size was normal in size. Right Atrium: Right atrial size was normal in size. Pericardium: There is no evidence of pericardial effusion. Presence of pericardial fat pad. Mitral Valve: The mitral valve is abnormal. There is mild thickening of the mitral valve leaflet(s). There is mild calcification of the mitral valve leaflet(s). Trivial mitral valve regurgitation. Tricuspid Valve: The tricuspid valve is grossly normal. Tricuspid valve regurgitation is trivial. Aortic Valve: The aortic valve is tricuspid. There is mild aortic valve annular calcification. Aortic valve regurgitation is mild. Aortic regurgitation PHT measures 321 msec. Pulmonic Valve: The pulmonic  valve was not well visualized. Pulmonic valve regurgitation is not visualized. Aorta: The aortic root is normal in size and structure. Venous: The inferior vena cava is normal in size with greater than 50% respiratory variability, suggesting right atrial pressure of 3 mmHg. IAS/Shunts: No atrial level shunt detected by color flow Doppler.  LEFT VENTRICLE PLAX 2D LVIDd:         4.00 cm LVIDs:         2.75 cm LV PW:         0.85 cm LV IVS:        0.95 cm LVOT diam:     2.00 cm LV SV:         52 LV SV Index:   35 LVOT Area:     3.14 cm  RIGHT VENTRICLE RV S prime:     9.57 cm/s TAPSE (M-mode): 1.8 cm LEFT ATRIUM             Index       RIGHT ATRIUM          Index LA diam:        2.10 cm 1.42 cm/m  RA Area:     8.20 cm LA Vol (A2C):   50.0 ml 33.88 ml/m RA Volume:   14.80 ml 10.03 ml/m LA Vol (A4C):   41.6 ml 28.19 ml/m LA Biplane Vol: 48.9 ml 33.14 ml/m  AORTIC VALVE LVOT Vmax:   89.40 cm/s LVOT Vmean:  54.300 cm/s LVOT VTI:    0.164 m AI PHT:      321 msec  AORTA Ao Root diam: 3.60 cm MITRAL VALVE MV Area (PHT): 4.06 cm     SHUNTS MV Decel Time: 187 msec     Systemic VTI:  0.16 m MV E velocity: 114.00 cm/s  Systemic Diam: 2.00 cm Nona Dell MD Electronically signed by Nona Dell MD Signature Date/Time: 02/08/2020/4:47:30 PM    Final    [place] EKG SITE RITE  Result Date: 02/28/2020 If Site Rite image not attached, placement could not be confirmed due to current cardiac rhythm.    CBC Recent Labs  Lab 02/29/20 0429 03/01/20 0430 03/02/20 0436 03/04/20 0426 03/06/20 0728  WBC 5.3 17.1* 17.2* 8.7 13.4*  HGB 13.7 12.7 12.7 13.0 13.0  HCT 39.3 37.1 38.2 39.5 40.3  PLT 498* 398 418* 342 273  MCV 96.6 97.9 99.5 101.3* 104.7*  MCH 33.7 33.5 33.1 33.3 33.8  MCHC 34.9 34.2 33.2 32.9 32.3  RDW 12.4 12.5 12.7 13.1 13.2  LYMPHSABS  --   --  0.9 0.7 0.9  MONOABS  --   --  0.8 1.2* 1.4*  EOSABS  --   --  0.0 0.0 0.1  BASOSABS  --   --  0.1 0.0 0.1    Chemistries  Recent Labs  Lab  02/29/20 0429 02/29/20 1820 03/01/20 0430 03/02/20 0436 03/04/20 0426 03/05/20 0631 03/06/20 0728  NA 129*  --  131* 134* 137 137 137  K 3.4*  --  3.0* 3.4* 3.1* 3.8 3.4*  CL 91*  --  92* 95* 98 106 107  CO2 26  --  28 26 26 22  19*  GLUCOSE 141*  --  91 121* 112* 176* 122*  BUN 77*  --  41* 24* 21 21 21   CREATININE 1.31*   < > 0.62 0.69 0.73 0.58 0.51  CALCIUM 8.3*  --  8.4* 8.6* 8.3* 8.0* 8.3*  MG 2.0  --   --  1.8 1.5* 2.2 1.7  AST 25  --  20 30 30  49* 59*  ALT 15  --  12 14 29  42 67*  ALKPHOS 33*  --  41 50 58 51 58  BILITOT 0.6  --  1.0 0.8 0.7 0.3 0.4   < > = values in this interval not displayed.   ------------------------------------------------------------------------------------------------------------------ Recent Labs    03/04/20 0427 03/06/20 0728  TRIG 146 80    No results found for: HGBA1C ------------------------------------------------------------------------------------------------------------------ Recent Labs    03/04/20 2144  TSH 5.007*   Coagulation profile No results for input(s): INR, PROTIME in the last 168 hours.  No results for input(s): DDIMER in the last 72 hours.  Cardiac Enzymes No results for input(s): CKMB, TROPONINI, MYOGLOBIN in the last 168 hours.  Invalid input(s): CK  No results found for: BNP  Shon Hale M.D on 03/06/2020 at 12:48 PM  Go to [email or web address] - for contact info  [facility] - Office  [phone]

## 2020-03-06 NOTE — TOC Progression Note (Signed)
Transition of Care [facility]) - Progression Note    Patient Details  Name: Samantha Huerta MRN: [id number] Date of Birth: 1945/05/29  Transition of Care [facility]) CM/SW Contact  Leitha Bleak, RN Phone Number: 03/06/2020, 1:13 PM  Clinical Narrative:   TOC spoke with Debbora Dus, She lives in [place] area and patient lives alone. MD is recommending SNF, PT has not worked with patient at present. [name] thinks her mother will want to go home, but may be to weak to care for herself. TOC started FL2 and has PASSR number. TOC to follow.    Expected Discharge Plan: Skilled Nursing Facility Barriers to Discharge: Continued Medical Work up  Expected Discharge Plan and Services Expected Discharge Plan: Skilled Nursing Facility In-house Referral: Clinical Social Work     Living arrangements for the past 2 months: Single Family Home                 DME Arranged: N/A      HH Agency: NA   Readmission Risk Interventions Readmission Risk Prevention Plan 02/29/2020  Transportation Screening Complete  HRI or Home Care Consult Complete  Social Work Consult for Recovery Care Planning/Counseling Complete  Palliative Care Screening Not Applicable  Medication Review Oceanographer) Complete  Some recent data might be hidden

## 2020-03-06 NOTE — Progress Notes (Signed)
NG tube clamped at 0925 per MD order. Wants pt to increase her oral intake today to hopefully produce a BM. Will continue to monitor and offer fluids throughout the shift.   Levophed gtt stopped at 0945 after weaning all morning due to patient's increasing blood pressure's. Will continue to monitor and can hopefully keep it off.

## 2020-03-06 NOTE — Consult Note (Signed)
Consultation Note Date: 03/06/2020   Patient Name: Samantha Huerta  DOB: 03-03-1946  MRN: [id number]  Age / Sex: 74 y.o., female  PCP: Sharion Balloon, FNP Referring Physician: Roxan Hockey, MD  Reason for Consultation: Establishing goals of care and Psychosocial/spiritual support  HPI/Patient Profile: 74 y.o. female  with past medical history of COPD, HTN/HLD, arthritis, anxiety, osteopenia, history of abdominal hysterectomy, elbow and shoulder surgeries admitted on 02/28/2020 with hypovolemic shock due to severe dehydration/vomiting with acute kidney injury, sepsis secondary to presumed aspiration pneumonia, SBO-appears to be improving.   Clinical Assessment and Goals of Care: Samantha Huerta is lying quietly in bed.  She appears acutely/chronically ill and quite frail.  She is lethargic/sleepy after receiving some medications for pain.  She is oriented to self only at this time, I do not believe she is able to make her basic needs known.  Her granddaughter, Apolonio Schneiders, is at bedside.  I have reviewed medical records including EPIC notes, labs and imaging, received report from attending and bedside nursing staff, examined the patient and met at bedside with granddaughter, Apolonio Schneiders, to discuss diagnosis prognosis, [place], EOL wishes, disposition and options.  I introduced Palliative Medicine as specialized medical care for people living with serious illness. It focuses on providing relief from the symptoms and stress of a serious illness.   We discussed a brief life review of the patient.  It seems that Samantha Huerta had been living independently prior to this illness.  As far as functional and nutritional status, she was independent with ADLs/IADLs.  Granddaughter Apolonio Schneiders states that she had been working at [employer], but retired 6 to 8 months ago.  Call to daughter, Hanley Seamen. We talked about the treatment plan,  time for outcomes.  We discussed her current illness and what it means in the larger context of her on-going co-morbidities.  We talked about TPN, clamped NG, clear liquids, surgery consult recommendations, some goals of care.  Glenard Haring shares her concerns about short-term rehab and possible need for alcohol rehab.  I shared that at this point, Samantha Huerta is not ready for physical therapy evaluation.  I share that if she qualifies for short-term rehab, 20 days are at no cost.  I shared that if she agrees to alcohol rehab, our transition of care team will help with placement and understanding costs.  Glenard Haring seems relieved.  Glenard Haring tells me that she will be coming to [place] on Wednesday night, we plan to meet at bedside sometime Thursday.  CODE STATUS not discussed today, as patient is unable, granddaughter at bedside, relationship building with daughter.  Questions and concerns were addressed.  The family was encouraged to call with questions or concerns.   Conference with attending, bedside nursing staff, transition of care team related to patient condition, needs, goals of care.  PMT to continue to follow.   HCPOA    NEXT OF KIN -daughter, Hanley Seamen, is main contact person.  Levada Dy lives near [place].  It is noted that Samantha Huerta  also has a son, but his name and number is not listed.    SUMMARY OF RECOMMENDATIONS   At this point full scope/full code Patient unable to have meaningful palliative discussions at this point PT evaluate for STIR needs PMT to continue to follow   Code Status/Advance Care Planning:  Full code -CODE STATUS discussion not completed at this time.  Symptom Management:   Per hospitalist, no additional needs at this time.  Palliative Prophylaxis:   Frequent Pain Assessment, Oral Care and Turn Reposition  Additional Recommendations (Limitations, Scope, Preferences):  Full Scope Treatment  Psycho-social/Spiritual:   Desire for  further Chaplaincy support:no  Additional Recommendations: Caregiving  Support/Resources  Prognosis:   Unable to determine, based on outcomes 6 months or more would not be surprising based on previous functional status, somewhat limited chronic illness burden.  Discharge Planning: To be determined, based on outcomes.  Need for short-term rehab expected      Primary Diagnoses: Present on Admission: . SBO (small bowel obstruction) ([place]) . AKI (acute kidney injury) ([place]) . Chronic midline low back pain without sciatica . COPD (chronic obstructive pulmonary disease) with chronic bronchitis ([place]) . Glaucoma . GERD (gastroesophageal reflux disease) . Hypertension . Hypotension due to hypovolemia   I have reviewed the medical record, interviewed the patient and family, and examined the patient. The following aspects are pertinent.  Past Medical History:  Diagnosis Date  . Anxiety   . Arthritis   . COPD (chronic obstructive pulmonary disease) ([place])   . GERD (gastroesophageal reflux disease)   . Glaucoma 1998   Dr Sharen Counter [facility]  . Hyperlipidemia   . Hypertension   . Osteopenia    Social History   Socioeconomic History  . Marital status: Divorced    Spouse name: Not on file  . Number of children: 2  . Years of education: 10  . Highest education level: 10th grade  Occupational History  . Not on file  Tobacco Use  . Smoking status: Current Every Day Smoker    Packs/day: 0.50    Years: 53.00    Pack years: 26.50    Types: Cigarettes  . Smokeless tobacco: Never Used  Vaping Use  . Vaping Use: Never used  Substance and Sexual Activity  . Alcohol use: Yes    Alcohol/week: 5.0 standard drinks    Types: 1 Cans of beer, 4 Shots of liquor per week  . Drug use: No  . Sexual activity: Not on file  Other Topics Concern  . Not on file  Social History Narrative   Lives at home alone. Feels safe there. She has an alarm system. She has two children. Her son lives locally  and her daughter lives in [place]. She has four granddaughters. She also has two nieces that live locally that she has dinner with on occasion and they will sometimes accompany her to doctor's appointments. She works at the [employer] in [place] about 30 hours a week. She enjoys working and gets a of physical activity there. 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    Social Determinants of Health   Financial Resource Strain: Not on file  Food Insecurity: Not on file  Transportation Needs: Not on file  Physical Activity: Not on file  Stress: Not on file  Social Connections: Not on file   Family History  Problem Relation Age of Onset  . Hip fracture Mother   . Glaucoma Sister   . Breast cancer Sister  31  . Diabetes Sister   . Glaucoma Sister   . Glaucoma Sister   . Glaucoma Sister   . Other Brother        killed in [place]  . Glaucoma Father   . Colon cancer Neg Hx    Scheduled Meds: . arformoterol  15 mcg Nebulization BID  . brinzolamide  1 drop Both Eyes BID  . chlorhexidine  15 mL Mouth Rinse BID  . Chlorhexidine Gluconate Cloth  6 each Topical Daily  . feeding supplement  1 Container Oral TID BM  . heparin  5,000 Units Subcutaneous Q8H  . insulin aspart  0-9 Units Subcutaneous Q6H  . latanoprost  1 drop Both Eyes QHS  . mouth rinse  15 mL Mouth Rinse q12n4p  . nicotine  14 mg Transdermal Daily  . pantoprazole (PROTONIX) IV  40 mg Intravenous Q12H  . umeclidinium bromide  1 puff Inhalation Daily   Continuous Infusions: . ampicillin-sulbactam (UNASYN) IV Stopped (03/06/20 0540)  . dexmedetomidine (PRECEDEX) IV infusion 1.4 mcg/kg/hr (03/06/20 1046)  . norepinephrine (LEVOPHED) Adult infusion Stopped (03/06/20 0945)  . sodium chloride 250 mL (03/03/20 1731)  . TPN ADULT (ION) 80 mL/hr at 03/06/20 1040  . TPN ADULT (ION)     PRN Meds:.acetaminophen **OR** acetaminophen, albuterol, LORazepam, morphine injection, ondansetron **OR** ondansetron (ZOFRAN)  IV, sodium chloride Medications Prior to Admission:  Prior to Admission medications   Medication Sig Start Date End Date Taking? Authorizing Provider  albuterol (VENTOLIN HFA) 108 (90 Base) MCG/ACT inhaler Inhale 2 puffs into the lungs every 4 (four) hours as needed for wheezing or shortness of breath. 03/25/19  Yes Rakes, Connye Burkitt, FNP  brinzolamide (AZOPT) 1 % ophthalmic suspension Place 1 drop into both eyes 2 (two) times daily.   Yes [provider]  Fluticasone-Salmeterol (ADVAIR) 250-50 MCG/DOSE AEPB Inhale 1 puff into the lungs in the morning and at bedtime. 12/02/19  Yes Hawks, Christy A, FNP  latanoprost (XALATAN) 0.005 % ophthalmic solution Place 1 drop into both eyes at bedtime.   Yes [provider]  omeprazole (PRILOSEC) 20 MG capsule Take 1 capsule (20 mg total) by mouth daily as needed. 11/26/19  Yes Hawks, Christy A, FNP  acetaminophen (TYLENOL) 500 MG tablet Take 500 mg by mouth every 6 (six) hours as needed for moderate pain or headache.    [provider]  amLODipine (NORVASC) 5 MG tablet TAKE ONE (1) TABLET EACH DAY Patient taking differently: Take 5 mg by mouth daily. 09/21/19   Evelina Dun A, FNP  aspirin EC 81 MG tablet Take 1 tablet (81 mg total) by mouth daily. Patient not taking: Reported on 03/01/2020 06/11/18   Baruch Gouty, FNP  Cholecalciferol (VITAMIN D3) 100000 UNIT/GM POWD Take by mouth.    [provider]  HYDROcodone-acetaminophen (NORCO) 5-325 MG tablet Take 1 tablet by mouth 2 (two) times daily as needed for moderate pain. 12/02/19 02/01/20  Evelina Dun A, FNP  lisinopril (ZESTRIL) 40 MG tablet TAKE ONE (1) TABLET EACH DAY Patient taking differently: Take 40 mg by mouth daily. TAKE ONE (1) TABLET EACH DAY 09/21/19   Evelina Dun A, FNP  Multiple Vitamins-Minerals (MULTIVITAMIN WITH MINERALS) tablet Take 1 tablet by mouth daily.    [provider]  naloxone [facility]) nasal spray 4 mg/0.1 mL Use as needed for  respiratory depression or narcotic overdose 09/15/18   Baruch Gouty, FNP  ondansetron (ZOFRAN) 4 MG tablet Take 1 tablet (4 mg total) by mouth every 8 (  eight) hours as needed for nausea or vomiting. 12/16/18   Baruch Gouty, FNP  oxyCODONE-acetaminophen (PERCOCET/ROXICET) 5-325 MG tablet Take 1 tablet by mouth 4 (four) times daily as needed. 09/22/19   [provider]  pravastatin (PRAVACHOL) 40 MG tablet TAKE ONE (1) TABLET EACH DAY Patient taking differently: Take 40 mg by mouth daily. 11/25/19   Sharion Balloon, FNP   No Known Allergies Review of Systems  Unable to perform ROS: Acuity of condition    Physical Exam Vitals and nursing note reviewed.  Constitutional:      General: She is not in acute distress.    Appearance: She is ill-appearing.  HENT:     Head: Normocephalic and atraumatic.     Mouth/Throat:     Mouth: Mucous membranes are dry.  Cardiovascular:     Rate and Rhythm: Normal rate.  Pulmonary:     Effort: Pulmonary effort is normal. No respiratory distress.  Abdominal:     General: Abdomen is flat.     Tenderness: There is no guarding.  Skin:    General: Skin is warm and dry.     Findings: Bruising present.  Neurological:     Comments: Lethargic, post sedation  Psychiatric:     Comments: Calm, sleepy      Vital Signs: BP 104/65   Pulse 96   Temp (!) 97.1 F (36.2 C) (Axillary)   Resp (!) 29   Ht $R'5\' 5"'Sg$  (1.651 m)   Wt 55.7 kg   SpO2 98%   BMI 20.43 kg/m  Pain Scale: CPOT   Pain Score: Asleep   SpO2: SpO2: 98 % O2 Device:SpO2: 98 % O2 Flow Rate: .O2 Flow Rate (L/min): 2 L/min  IO: Intake/output summary:   Intake/Output Summary (Last 24 hours) at 03/06/2020 1111 Last data filed at 03/06/2020 1040 Gross per 24 hour  Intake 3453.56 ml  Output 1600 ml  Net 1853.56 ml    LBM: Last BM Date: 02/23/20 Baseline Weight: Weight: 45.4 kg Most recent weight: Weight: 55.7 kg     Palliative Assessment/Data:     Time In: 1300 Time Out:  1410 Time Total: 70 minutes  Greater than 50%  of this time was spent counseling and coordinating care related to the above assessment and plan.  Signed by: Drue Novel, NP   Please contact Palliative Medicine Team phone at [phone] for questions and concerns.  For individual provider: See Shea Evans

## 2020-03-06 NOTE — Progress Notes (Addendum)
[facility] Progress Note     Subjective: Rn reported small BMs. Clears started by Dr. Mariea Clonts and NG clamped for now. Patient more awake. Treatment for withdrawal and pneumonia this past weekend. Grand daughter at bedside.   On TPN for nutrition.   Objective: Vital signs in last 24 hours: Temp:  [94.8 F (34.9 C)-97.8 F (36.6 C)] 97.3 F (36.3 C) (12/27 1135) Pulse Rate:  [66-119] 86 (12/27 1300) Resp:  [17-34] 27 (12/27 1300) BP: (87-146)/(47-87) 108/62 (12/27 1300) SpO2:  [88 %-100 %] 100 % (12/27 1300) Weight:  [55.7 kg] 55.7 kg (12/27 0441) Last BM Date: 02/23/20  Intake/Output from previous day: 12/26 0701 - 12/27 0700 In: 2558.6 [I.V.:2158.6; IV Piggyback:400] Out: 1300 [Urine:850; Emesis/NG output:450] Intake/Output this shift: Total I/O In: 945 [P.O.:50; I.V.:795; IV Piggyback:100] Out: 300 [Emesis/NG output:300]  General appearance: no distress GI: mildly distended, nontender  Lab Results:  Recent Labs    03/04/20 0426 03/06/20 0728  WBC 8.7 13.4*  HGB 13.0 13.0  HCT 39.5 40.3  PLT 342 273   BMET Recent Labs    03/05/20 0631 03/06/20 0728  NA 137 137  K 3.8 3.4*  CL 106 107  CO2 22 19*  GLUCOSE 176* 122*  BUN 21 21  CREATININE 0.58 0.51  CALCIUM 8.0* 8.3*   PT/INR No results for input(s): LABPROT, INR in the last 72 hours.  Studies/Results: DG Abd 1 View  Result Date: 03/05/2020 CLINICAL DATA:  Small-bowel obstruction EXAM: ABDOMEN - 1 VIEW COMPARISON:  03/03/2000 FINDINGS: Nasogastric tube in the decompressed stomach. Small bowel and colon appear decompressed. Surgical clips in the right pelvis. Multilevel lumbar spondylitic change. IMPRESSION: Nasogastric tube in the stomach. Electronically Signed   By: Corlis Leak M.D.   On: 03/05/2020 08:17   DG ABD ACUTE 2+V W 1V CHEST  Result Date: 03/05/2020 CLINICAL DATA:  Small-bowel obstruction, nausea, weakness EXAM: DG ABDOMEN ACUTE WITH 1 VIEW CHEST COMPARISON:  Earlier  abdominal radiograph 0800 hours, chest radiograph 02/29/2020 FINDINGS: Nasogastric tube extends into stomach. RIGHT jugular line tip projecting over SVC. Normal heart size, mediastinal contours, and pulmonary vascularity. Atherosclerotic calcification aorta. Atelectasis versus consolidation LEFT lower lobe new since prior study. Small LEFT pleural effusion. Underlying emphysematous changes. No pneumothorax. Nonobstructive bowel gas pattern. No bowel dilatation or wall thickening. No free air. Bones demineralized with BILATERAL chronic rotator cuff tears and degenerative disc disease changes of the thoracic/lumbar spine. IMPRESSION: Nonobstructive bowel gas pattern. Atelectasis versus consolidation LEFT lower lobe new since 02/28/2020 with small associated LEFT pleural effusion. Electronically Signed   By: Ulyses Southward M.D.   On: 03/05/2020 13:04    Anti-infectives: Anti-infectives (From admission, onward)   Start     Dose/Rate Route Frequency Ordered Stop   03/02/20 1830  Ampicillin-Sulbactam (UNASYN) 3 g in sodium chloride 0.9 % 100 mL IVPB        3 g 200 mL/hr over 30 Minutes Intravenous Every 6 hours 03/02/20 1804     02/29/20 2200  vancomycin (VANCOREADY) IVPB 500 mg/100 mL  Status:  Discontinued        500 mg 100 mL/hr over 60 Minutes Intravenous Every 24 hours 02/29/20 0851 02/29/20 1001   02/29/20 1400  piperacillin-tazobactam (ZOSYN) IVPB 3.375 g  Status:  Discontinued        3.375 g 12.5 mL/hr over 240 Minutes Intravenous Every 8 hours 02/29/20 0855 02/29/20 1001   02/28/20 1900  vancomycin (VANCOCIN) IVPB 1000 mg/200 mL premix  1,000 mg 200 mL/hr over 60 Minutes Intravenous  Once 02/28/20 1825 02/29/20 0729   02/28/20 1845  piperacillin-tazobactam (ZOSYN) IVPB 3.375 g  Status:  Discontinued        3.375 g 12.5 mL/hr over 240 Minutes Intravenous Every 12 hours 02/28/20 1831 02/29/20 0855   02/28/20 1830  piperacillin-tazobactam (ZOSYN) IVPB 3.375 g  Status:  Discontinued         3.375 g 100 mL/hr over 30 Minutes Intravenous Every 12 hours 02/28/20 1825 02/28/20 1831   02/28/20 1825  vancomycin variable dose per unstable renal function (pharmacist dosing)  Status:  Discontinued         Does not apply See admin instructions 02/28/20 1825 02/29/20 0851   02/28/20 0930  cefTRIAXone (ROCEPHIN) 2 g in sodium chloride 0.9 % 100 mL IVPB        2 g 200 mL/hr over 30 Minutes Intravenous  Once 02/28/20 0920 02/28/20 1017   02/28/20 0930  metroNIDAZOLE (FLAGYL) IVPB 500 mg        500 mg 100 mL/hr over 60 Minutes Intravenous  Once 02/28/20 0920 02/28/20 1057      Assessment/Plan: Samantha Huerta is a 74 yo with SBO complicated by withdrawal and aspiration PNA. Doing better. Slowly regaining bowel function. Slightly more distended today compared to Friday on my exam. Agree with NG clamp and clear trial. If NG comes out would not replace unless vomiting.   Continue TPN.  Discussed with Dr. Mariea Clonts and RN. Granddaughter updated at bedside.    LOS: 7 days    Samantha Huerta 03/06/2020

## 2020-03-07 ENCOUNTER — Inpatient Hospital Stay (HOSPITAL_COMMUNITY): Admission: RE | Admit: 2020-03-07 | Payer: Medicare Other | Source: Ambulatory Visit

## 2020-03-07 ENCOUNTER — Other Ambulatory Visit (HOSPITAL_COMMUNITY): Payer: Medicare Other

## 2020-03-07 DIAGNOSIS — K56609 Unspecified intestinal obstruction, unspecified as to partial versus complete obstruction: Secondary | ICD-10-CM | POA: Diagnosis not present

## 2020-03-07 LAB — GLUCOSE, CAPILLARY
Glucose-Capillary: 101 mg/dL — ABNORMAL HIGH (ref 70–99)
Glucose-Capillary: 133 mg/dL — ABNORMAL HIGH (ref 70–99)
Glucose-Capillary: 95 mg/dL (ref 70–99)
Glucose-Capillary: 96 mg/dL (ref 70–99)

## 2020-03-07 LAB — BASIC METABOLIC PANEL
Anion gap: 9 (ref 5–15)
BUN: 23 mg/dL (ref 8–23)
CO2: 20 mmol/L — ABNORMAL LOW (ref 22–32)
Calcium: 7.9 mg/dL — ABNORMAL LOW (ref 8.9–10.3)
Chloride: 108 mmol/L (ref 98–111)
Creatinine, Ser: 0.49 mg/dL (ref 0.44–1.00)
GFR, Estimated: >60 mL/min (ref >60)
Glucose, Bld: 145 mg/dL — ABNORMAL HIGH (ref 70–99)
Potassium: 3.7 mmol/L (ref 3.5–5.1)
Sodium: 137 mmol/L (ref 135–145)

## 2020-03-07 LAB — MAGNESIUM: Magnesium: 1.6 mg/dL — ABNORMAL LOW (ref 1.7–2.4)

## 2020-03-07 MED ORDER — DILTIAZEM HCL 30 MG PO TABS
30.0000 mg | ORAL_TABLET | Freq: Three times a day (TID) | ORAL | Status: DC
  Administered 2020-03-07 – 2020-03-08 (×3): 30 mg via ORAL
  Filled 2020-03-07 (×3): qty 1

## 2020-03-07 MED ORDER — MAGNESIUM SULFATE 2 GM/50ML IV SOLN
2.0000 g | Freq: Once | INTRAVENOUS | Status: AC
  Administered 2020-03-07: 19:00:00 2 g via INTRAVENOUS
  Filled 2020-03-07: qty 50

## 2020-03-07 MED ORDER — AMOXICILLIN-POT CLAVULANATE 875-125 MG PO TABS
1.0000 | ORAL_TABLET | Freq: Two times a day (BID) | ORAL | Status: AC
  Administered 2020-03-08 – 2020-03-09 (×3): 1 via ORAL
  Filled 2020-03-07 (×3): qty 1

## 2020-03-07 MED ORDER — TRAVASOL 10 % IV SOLN
INTRAVENOUS | Status: DC
  Filled 2020-03-07: qty 806.4

## 2020-03-07 MED ORDER — DILTIAZEM HCL 25 MG/5ML IV SOLN
10.0000 mg | Freq: Once | INTRAVENOUS | Status: AC
  Administered 2020-03-07: 17:00:00 10 mg via INTRAVENOUS
  Filled 2020-03-07: qty 5

## 2020-03-07 NOTE — Progress Notes (Signed)
Patient Demographics:    Samantha Huerta, is a 74 y.o. female, DOB - [birth year], ZOX:[id number]  Admit date - 02/28/2020   Admitting Physician [name] [name], MD  Outpatient Primary MD for the patient is [name], FNP  LOS - 8   Chief Complaint  Patient presents with  . Nausea  . Weakness        Subjective:    Samantha Huerta today has no fevers, no emesis,  No chest pain,   More awake -Patient has husband is at bedside -Able to tolerate oral intake -Having bowel movements -Tachycardia episodes noted  Assessment  & Plan :    Principal Problem:   SBO (small bowel obstruction) (HCC) Active Problems:   Hypertension   GERD (gastroesophageal reflux disease)   COPD (chronic obstructive pulmonary disease) with chronic bronchitis (HCC)   Glaucoma   Chronic midline low back pain without sciatica   AKI (acute kidney injury) (HCC)   Hypotension due to hypovolemia   Protein-calorie malnutrition, severe  Brief Narrative: Patient is a 74 y.o. female with PMHx of COPD, HTN, glaucoma, chronic back pain, tobacco use, alcohol abuse-presented with 5-day history of abdominal pain, vomiting.  She was found to have hypovolemic shock, AKI and high-grade bowel obstruction.  She was kept n.p.o.-NGT was placed-IV fluid resuscitation was started-she was subsequently admitted to the ICU.  Even with IV fluid resuscitation-she remained hypotensive-hence right IJ central venous access was obtained-and patient was started on pressors.   Delirium tremens improving    Assessment/Plan: Hypovolemic shock: Due to severe dehydration/vomiting- -patient developed worsening hypotension with increasing lactic acid levels, --Received IV fluid boluses and   Levophed--  -Over the last several days patient has been on and off Levophed--typically becoming hypotensive and Levophed is switched -Able to come off IV Levophed  on 03/07/2020   Continue IVF- Random cortisol >100  -Empiric Vanco and Zosyn discontinued on 02/29/2020 -Blood cultures from 02/28/2020 and repeat blood cultures from 03/01/2020 NGTD -IV Unasyn started on 03/02/2020 for aspiration pneumonia -Switch to p.o. Augmentin in a.m.  -Sepsis secondary to presumed Aspiration Pneumonia --leukocytosis resolved,  -Continue  Iv Unasyn as above -Sepsis pathophysiology resolving, off Levophed  New Onset Afib with RVR- Afib with RVR with HR > 170 bpm -Cardizem iv 10 mg x 1 -Followed by p.o. Cardizem 30 mg 3 times daily - echo from 02/08/2020 with EF of 55 to 60% -Left atrial size was normal -No significant valvular disease -Hold off on anticoagulation for now  AKI: Hemodynamically mediated due to vomiting and lisinopril use.  UA without proteinuria, CT abdomen without hydronephrosis. -Creatinine was 2.0 on admission -AKI has resolved -renally adjust medications, avoid nephrotoxic agents / dehydration  / hypotension  -Urinary retention--patient failed voiding trial on 03/01/2020,  foley in situ--reinserted 03/02/2020 due to urinary retention  Hyponatremia/hypomagnesemia/hypokalemia: In an alcoholic female -replace and recheck electrolytes, -Monitor electrolytes carefully with TPN infusion  SBO: - -Clinically and radiologically SBO has resolved -Follow-up x-rays without significant bowel dilatation -Small bowel follow-through on 03/01/2020 -General surgeon Dr. Pennie Rushing. Lady Gary recommends conservative management, hold off on operative intervention at this time -Started IV TPN on 03/03/2020 -NGT removed, tolerating oral intake well  COPD: Stable-continue bronchodilators.  Glaucoma: Continue eyedrops.  Tobacco abuse:use transdermal nicotine.  Alcohol Abuse/delirium tremens: --Alcohol withdrawal symptoms/delirium tremens-mostly resolving -Coming off Precedex drip on 03/07/2020 -Okay to use as needed Ativan PTA pt drank significant  amount of vodka daily-claims last drink was on 12/18- - Chronic back pain: On as needed narcotics  Urinary retention--  foley in situ--reinserted 03/02/2020 after patient failed voiding trial due to urinary retention  CRITICAL CARE Performed by: Shon Hale  Total critical care time: 41 minutes  Critical care time was exclusive of separately billable procedures and treating other patients.  --- Delirium tremens with persistent agitation and DTs requiring IV Precedex drip continuously -Hemodynamic instability with lactic acidosis in setting of sepsis from pneumonia requiring IV Levophed for pressure support  Critical care was necessary to treat or prevent imminent or life-threatening deterioration.  Critical care was time spent personally by me on the following activities: development of treatment plan with patient and/or surrogate as well as nursing, discussions with consultants, evaluation of patient's response to treatment, examination of patient, obtaining history from patient or surrogate, ordering and performing treatments and interventions, ordering and review of laboratory studies, ordering and review of radiographic studies, pulse oximetry and re-evaluation of patient's condition.   Diet Orders (From admission, onward)    Start     Ordered   03/07/20 1635  Diet clear liquid Room service appropriate? Yes; Fluid consistency: Thin  Diet effective now       Comments: Please add extra jello and [place] ice  Question Answer Comment  Room service appropriate? Yes   Fluid consistency: Thin      03/07/20 1635         Significant events: 12/20>> admit for hypovolemic shock, AKI in the setting of bowel obstruction. 12/20>> persistently hypotensive in spite of IVF resuscitation-right IJ placed by surgery-started on Levophed -TPN started on 03/04/2020 -Went into atrial fibrillation on 03/07/2020  Significant studies: 12/20>> CT abdomen/pelvis: High-grade SBO related to  adhesions in the proximal ileum 12/20>> chest x-ray: No pneumonia (personally reviewed) 12/21- abd xray with SBO  Antimicrobial therapy: Vancomycin: 12/20>> 12/21 Zosyn: 12/20>> 12/21 Ceftriaxone: 12/20 x 1 in ED Flagyl: 12/20 x 1 in ED -Unasyn started on 03/02/2020  Microbiology data: 12/20>> blood cultures: NGTD 03/01/20 Repeat Blood cx NGTD  Procedures : 12/20>> right IJ central venous catheter placed by Dr. Henreitta Leber  Consults: General surgery  DVT Prophylaxis : heparin injection 5,000 Units Start: 02/28/20 1400  Disposition/Need for in-Hospital Stay- patient unable to be discharged at this time due to -improving sepsis pathophysiology, improving oral intake, will need SNF placement Status is: Inpatient  Remains inpatient appropriate because:Please see above   Disposition: The patient is from: Home              Anticipated d/c is to: SNF              Anticipated d/c date is: > 3 days              Patient currently is not medically stable to d/c. Barriers: Not Clinically Stable-   Code Status : -  Code Status: Full Code   Family Communication:   Daughter-[name]-[phone]-  updated by phone call 03/05/2020  Consults  :  Gen surg  DVT Prophylaxis  :   - SCDs  heparin injection 5,000 Units Start: 02/28/20 1400  Lab Results  Component Value Date   PLT 273 03/06/2020    Inpatient Medications  Scheduled Meds: . arformoterol  15 mcg Nebulization BID  . brinzolamide  1 drop Both Eyes BID  .  chlorhexidine  15 mL Mouth Rinse BID  . Chlorhexidine Gluconate Cloth  6 each Topical Daily  . diltiazem  30 mg Oral TID  . feeding supplement  1 Container Oral TID BM  . heparin  5,000 Units Subcutaneous Q8H  . insulin aspart  0-9 Units Subcutaneous Q6H  . latanoprost  1 drop Both Eyes QHS  . mouth rinse  15 mL Mouth Rinse q12n4p  . nicotine  14 mg Transdermal Daily  . pantoprazole (PROTONIX) IV  40 mg Intravenous Q12H  . umeclidinium bromide  1 puff  Inhalation Daily   Continuous Infusions: . ampicillin-sulbactam (UNASYN) IV 3 g (03/07/20 1646)  . dexmedetomidine (PRECEDEX) IV infusion Stopped (03/07/20 1330)  . norepinephrine (LEVOPHED) Adult infusion Stopped (03/06/20 0945)  . sodium chloride 250 mL (03/03/20 1731)  . TPN ADULT (ION) 80 mL/hr at 03/07/20 1522  . TPN ADULT (ION) 80 mL/hr at 03/07/20 1652   PRN Meds:.acetaminophen **OR** acetaminophen, albuterol, LORazepam, morphine injection, ondansetron **OR** ondansetron (ZOFRAN) IV, sodium chloride   Anti-infectives (From admission, onward)   Start     Dose/Rate Route Frequency Ordered Stop   03/02/20 1830  Ampicillin-Sulbactam (UNASYN) 3 g in sodium chloride 0.9 % 100 mL IVPB        3 g 200 mL/hr over 30 Minutes Intravenous Every 6 hours 03/02/20 1804     02/29/20 2200  vancomycin (VANCOREADY) IVPB 500 mg/100 mL  Status:  Discontinued        500 mg 100 mL/hr over 60 Minutes Intravenous Every 24 hours 02/29/20 0851 02/29/20 1001   02/29/20 1400  piperacillin-tazobactam (ZOSYN) IVPB 3.375 g  Status:  Discontinued        3.375 g 12.5 mL/hr over 240 Minutes Intravenous Every 8 hours 02/29/20 0855 02/29/20 1001   02/28/20 1900  vancomycin (VANCOCIN) IVPB 1000 mg/200 mL premix        1,000 mg 200 mL/hr over 60 Minutes Intravenous  Once 02/28/20 1825 02/29/20 0729   02/28/20 1845  piperacillin-tazobactam (ZOSYN) IVPB 3.375 g  Status:  Discontinued        3.375 g 12.5 mL/hr over 240 Minutes Intravenous Every 12 hours 02/28/20 1831 02/29/20 0855   02/28/20 1830  piperacillin-tazobactam (ZOSYN) IVPB 3.375 g  Status:  Discontinued        3.375 g 100 mL/hr over 30 Minutes Intravenous Every 12 hours 02/28/20 1825 02/28/20 1831   02/28/20 1825  vancomycin variable dose per unstable renal function (pharmacist dosing)  Status:  Discontinued         Does not apply See admin instructions 02/28/20 1825 02/29/20 0851   02/28/20 0930  cefTRIAXone (ROCEPHIN) 2 g in sodium chloride 0.9 % 100 mL  IVPB        2 g 200 mL/hr over 30 Minutes Intravenous  Once 02/28/20 0920 02/28/20 1017   02/28/20 0930  metroNIDAZOLE (FLAGYL) IVPB 500 mg        500 mg 100 mL/hr over 60 Minutes Intravenous  Once 02/28/20 0920 02/28/20 1057        Objective:   Vitals:   03/07/20 1000 03/07/20 1054 03/07/20 1100 03/07/20 1634  BP: (!) 107/56  104/63 (!) 143/84  Pulse: (!) 103 (!) 115 (!) 108   Resp: (!) 27 (!) 35 (!) 30 (!) 26  Temp:  98.3 F (36.8 C)  97.6 F (36.4 C)  TempSrc:  Oral  Axillary  SpO2: 99% 99% 99%   Weight:      Height:  Wt Readings from Last 3 Encounters:  03/07/20 56.3 kg  02/01/20 46.7 kg  12/02/19 48.1 kg    Intake/Output Summary (Last 24 hours) at 03/07/2020 1657 Last data filed at 03/07/2020 1522 Gross per 24 hour  Intake 2694.79 ml  Output 1125 ml  Net 1569.79 ml   Physical Exam Gen Exam:Alert awake-not in any distress HEENT:atraumatic, normocephalic Nose -[place] 2L/min Neck -- Rt IJ central line Chest: Fair air movement, no wheezing CVS:S1S2 regular Abdomen:soft , ++bowel sounds,ND, not significantly tender Extremities:no edema, pedal pulses noted Neurology: Non focal,  moving all extremities Psych-on and off episodes of confusion and agitation persist Skin: no rash, warm/dry GU- foley in situ--reinserted 03/02/2020 due to urinary retention   Data Review:   Micro Results Recent Results (from the past 240 hour(s))  Urine culture     Status: None   Collection Time: 02/28/20  9:13 AM   Specimen: In/Out Cath Urine  Result Value Ref Range Status   Specimen Description   Final    IN/OUT CATH URINE Performed at [facility], [address], [place], [place] [address]    Special Requests   Final    NONE Performed at [facility], [address], [place], [place] [address]    Culture   Final    NO GROWTH Performed at [facility], [address] [place], [place] [address]    Report Status 02/29/2020 FINAL  Final  Blood Culture (routine x  2)     Status: None   Collection Time: 02/28/20  9:14 AM   Specimen: BLOOD  Result Value Ref Range Status   Specimen Description BLOOD RIGHT ANTECUBITAL  Final   Special Requests   Final    BOTTLES DRAWN AEROBIC AND ANAEROBIC Blood Culture results may not be optimal due to an inadequate volume of blood received in culture bottles   Culture   Final    NO GROWTH 5 DAYS Performed at [facility], [address] [place], [place] [address]    Report Status 03/04/2020 FINAL  Final  Resp Panel by RT-PCR (Flu A&B, Covid) Nasopharyngeal Swab     Status: None   Collection Time: 02/28/20  9:20 AM   Specimen: Nasopharyngeal Swab; Nasopharyngeal(NP) swabs in vial transport medium  Result Value Ref Range Status   SARS Coronavirus 2 by RT PCR NEGATIVE NEGATIVE Final    Comment: (NOTE) SARS-CoV-2 target nucleic acids are NOT DETECTED.  The SARS-CoV-2 RNA is generally detectable in upper respiratory specimens during the acute phase of infection. The lowest concentration of SARS-CoV-2 viral copies this assay can detect is 138 copies/mL. A negative result does not preclude SARS-Cov-2 infection and should not be used as the sole basis for treatment or other patient management decisions. A negative result may occur with  improper specimen collection/handling, submission of specimen other than nasopharyngeal swab, presence of viral mutation(s) within the areas targeted by this assay, and inadequate number of viral copies(<138 copies/mL). A negative result must be combined with clinical observations, patient history, and epidemiological information. The expected result is Negative.  Fact Sheet for Patients:  BloggerCourse.com  Fact Sheet for Healthcare Providers:  SeriousBroker.it  This test is no t yet approved or cleared by the [place] FDA and  has been authorized for detection and/or diagnosis of SARS-CoV-2 by FDA under an Emergency Use  Authorization (EUA). This EUA will remain  in effect (meaning this test can be used) for the duration of the COVID-19 declaration under Section 564(b)(1) of the  Act, 21 U.S.C.section 360bbb-3(b)(1), unless the authorization is terminated  or revoked sooner.       Influenza A by PCR NEGATIVE NEGATIVE Final   Influenza B by PCR NEGATIVE NEGATIVE Final    Comment: (NOTE) The Xpert Xpress SARS-CoV-2/FLU/RSV plus assay is intended as an aid in the diagnosis of influenza from Nasopharyngeal swab specimens and should not be used as a sole basis for treatment. Nasal washings and aspirates are unacceptable for Xpert Xpress SARS-CoV-2/FLU/RSV testing.  Fact Sheet for Patients: BloggerCourse.com  Fact Sheet for Healthcare Providers: SeriousBroker.it  This test is not yet approved or cleared by the [place] FDA and has been authorized for detection and/or diagnosis of SARS-CoV-2 by FDA under an Emergency Use Authorization (EUA). This EUA will remain in effect (meaning this test can be used) for the duration of the COVID-19 declaration under Section 564(b)(1) of the Act, 21 U.S.C. section 360bbb-3(b)(1), unless the authorization is terminated or revoked.  Performed at [facility], [address], [place], [place] [address]   Blood Culture (routine x 2)     Status: None   Collection Time: 02/28/20  9:57 AM   Specimen: BLOOD LEFT FOREARM  Result Value Ref Range Status   Specimen Description BLOOD LEFT FOREARM  Final   Special Requests   Final    BOTTLES DRAWN AEROBIC AND ANAEROBIC Blood Culture results may not be optimal due to an inadequate volume of blood received in culture bottles   Culture   Final    NO GROWTH 5 DAYS Performed at [facility], [address] [place], [place] [address]    Report Status 03/04/2020 FINAL  Final  MRSA PCR Screening     Status: Abnormal   Collection Time: 02/28/20  2:35 PM   Specimen:  Nasopharyngeal  Result Value Ref Range Status   MRSA by PCR POSITIVE (A) NEGATIVE Final    Comment:        The GeneXpert MRSA Assay (FDA approved for NASAL specimens only), is one component of a comprehensive MRSA colonization surveillance program. It is not intended to diagnose MRSA infection nor to guide or monitor treatment for MRSA infections. RESULT CALLED TO, READ BACK BY AND VERIFIED WITH: SHELTON,A AT 1632 BY HUFFINES,S ON 02/28/20. Performed at [facility], [address] [place], [place] [address]   Culture, blood (Routine X 2) w Reflex to ID Panel     Status: None   Collection Time: 03/01/20 11:33 AM   Specimen: BLOOD  Result Value Ref Range Status   Specimen Description BLOOD BLOOD RIGHT ARM  Final   Special Requests   Final    BOTTLES DRAWN AEROBIC AND ANAEROBIC Blood Culture adequate volume   Culture   Final    NO GROWTH 5 DAYS Performed at [facility], [address] [place], [place] [address]    Report Status 03/06/2020 FINAL  Final  Culture, blood (Routine X 2) w Reflex to ID Panel     Status: None   Collection Time: 03/01/20 11:33 AM   Specimen: BLOOD  Result Value Ref Range Status   Specimen Description BLOOD BLOOD LEFT ARM  Final   Special Requests   Final    BOTTLES DRAWN AEROBIC AND ANAEROBIC Blood Culture results may not be optimal due to an inadequate volume of blood received in culture bottles   Culture   Final    NO GROWTH 5 DAYS Performed at [facility], [address], [place], [place] [address]    Report Status 03/06/2020  FINAL  Final    Radiology Reports CT ABDOMEN PELVIS WO CONTRAST  Result Date: 02/28/2020 CLINICAL DATA:  Nausea and vomiting for several days EXAM: CT ABDOMEN AND PELVIS WITHOUT CONTRAST TECHNIQUE: Multidetector CT imaging of the abdomen and pelvis was performed following the standard protocol without IV contrast. COMPARISON:  None. FINDINGS: Lower chest: No acute abnormality. Hepatobiliary: No focal liver abnormality is  seen. No gallstones, gallbladder wall thickening, or biliary dilatation. Pancreas: Unremarkable. No pancreatic ductal dilatation or surrounding inflammatory changes. Spleen: Normal in size without focal abnormality. Adrenals/Urinary Tract: Adrenal glands are within normal limits. Kidneys are well visualized bilaterally. No renal calculi or obstructive changes are seen. Bladder is decompressed. Stomach/Bowel: Colon shows diverticular change without evidence of obstructive or inflammatory change. Colon is predominately decompressed although dense material is noted within the colon which may be related to ingested material. The appendix is not visualized and surgical clips are noted likely related to prior removal. The stomach is significantly distended with fluid as is the small bowel throughout the jejunum in into the proximal ileum. There is a transition zone identified in the mid abdomen best seen on image number 44 of series 2 and image number 41 of series 5. This is likely related to adhesions as no definitive mass is seen. The more distal small bowel is unremarkable. Vascular/Lymphatic: Aortic atherosclerosis. No enlarged abdominal or pelvic lymph nodes. Reproductive: Status post hysterectomy. No adnexal masses. Other: No abdominal wall hernia or abnormality. No abdominopelvic ascites. Musculoskeletal: Degenerative changes of lumbar spine are noted. No acute compression deformity is noted. IMPRESSION: High-grade small bowel obstruction likely related to adhesions in the proximal ileum. More distal small bowel and colon are decompressed. No other focal abnormality is noted. Electronically Signed   By: Alcide Clever M.D.   On: 02/28/2020 11:10   DG Abd 1 View  Result Date: 03/05/2020 CLINICAL DATA:  Small-bowel obstruction EXAM: ABDOMEN - 1 VIEW COMPARISON:  03/03/2000 FINDINGS: Nasogastric tube in the decompressed stomach. Small bowel and colon appear decompressed. Surgical clips in the right pelvis.  Multilevel lumbar spondylitic change. IMPRESSION: Nasogastric tube in the stomach. Electronically Signed   By: Corlis Leak M.D.   On: 03/05/2020 08:17   DG Abd 1 View  Result Date: 03/01/2020 CLINICAL DATA:  Small bowel obstruction EXAM: ABDOMEN - 1 VIEW COMPARISON:  CT 2 days ago FINDINGS: Ongoing small bowel obstruction with dilated loops in the central abdomen measuring up to 3.8 cm. The degree of distension is improved, but no new colonic gas is seen. No gross pneumoperitoneum. Clear lung bases. IMPRESSION: Ongoing small bowel obstruction with improved small bowel distension. Electronically Signed   By: Marnee Spring M.D.   On: 03/01/2020 07:44   DG Chest Port 1 View  Result Date: 02/28/2020 CLINICAL DATA:  74 year old female status post central line placement. EXAM: PORTABLE CHEST 1 VIEW COMPARISON:  Chest radiograph dated 04/05/2016 and CT dated 07/08/2019. FINDINGS: Right IJ central venous line with tip over central SVC. Enteric tube with tip in the distal stomach. There is background of emphysema. No focal consolidation, pleural effusion or pneumothorax. The cardiac silhouette is within limits. Atherosclerotic calcification of the aorta. Osteopenia with degenerative changes of the spine and shoulders. Old healed right hip fractures. No acute osseous pathology. IMPRESSION: Right IJ central venous line with tip over central SVC. Electronically Signed   By: Elgie Collard M.D.   On: 02/28/2020 18:46   DG ABD ACUTE 2+V W 1V CHEST  Result Date: 03/05/2020 CLINICAL DATA:  Small-bowel obstruction, nausea, weakness EXAM: DG ABDOMEN ACUTE WITH 1 VIEW CHEST COMPARISON:  Earlier abdominal radiograph 0800 hours, chest radiograph 02/29/2020 FINDINGS: Nasogastric tube extends into stomach. RIGHT jugular line tip projecting over SVC. Normal heart size, mediastinal contours, and pulmonary vascularity. Atherosclerotic calcification aorta. Atelectasis versus consolidation LEFT lower lobe new since prior  study. Small LEFT pleural effusion. Underlying emphysematous changes. No pneumothorax. Nonobstructive bowel gas pattern. No bowel dilatation or wall thickening. No free air. Bones demineralized with BILATERAL chronic rotator cuff tears and degenerative disc disease changes of the thoracic/lumbar spine. IMPRESSION: Nonobstructive bowel gas pattern. Atelectasis versus consolidation LEFT lower lobe new since 02/28/2020 with small associated LEFT pleural effusion. Electronically Signed   By: Ulyses SouthwardMark  Boles M.D.   On: 03/05/2020 13:04   DG ABD ACUTE 2+V W 1V CHEST  Result Date: 03/03/2020 CLINICAL DATA:  Small-bowel obstruction, COPD, hypertension, smoker EXAM: DG ABDOMEN ACUTE WITH 1 VIEW CHEST COMPARISON:  03/02/2020 FINDINGS: Tip of RIGHT jugular line projects over SVC. Nasogastric tube extends into stomach. Normal heart size, mediastinal contours, and pulmonary vascularity. Severe aorta Emphysematous and bronchitic changes consistent with COPD. Atelectasis versus consolidation LEFT lower lobe slightly increased. Remaining lungs free of acute infiltrate. No pleural effusion or pneumothorax. Single prominent small bowel loop in the mid abdomen. Questionable gastric wall thickening versus artifact from underdistention. No bowel wall thickening or free air. Multilevel degenerative disc disease changes lumbar spine. Degenerative changes RIGHT hip joint. Surgical clips in pelvis. IMPRESSION: COPD changes with atelectasis versus consolidation LEFT lower lobe. Single prominent small bowel loop in the mid abdomen with decreased small bowel distension since previous study. Questionable gastric wall thickening versus artifact from underdistention. Electronically Signed   By: Ulyses SouthwardMark  Boles M.D.   On: 03/03/2020 09:47   DG ABD ACUTE 2+V W 1V CHEST  Result Date: 03/02/2020 CLINICAL DATA:  Small-bowel obstruction. EXAM: DG ABDOMEN ACUTE WITH 1 VIEW CHEST COMPARISON:  March 01, 2020. FINDINGS: Mild left basilar opacities  with partial silhouetting of the left hemidiaphragm. No visible pleural effusions or pneumothorax. Stable cardiomediastinal silhouette. Enteric tube courses below the diaphragm with the tip in the distal stomach/peripyloric region, unchanged. Right central venous catheter with the tip projecting in the region of the superior cavoatrial junction. Slight increase in small bowel dilation with small bowel measuring up to 4 cm in the left lower abdomen. No other interval change. No gross pneumoperitoneum on this supine radiograph. Surgical clips projecting over the lower abdomen/pelvis. Polyarticular degenerative change. IMPRESSION: 1. Slight increase in small bowel dilation. 2. Mild left basilar opacities, which may represent atelectasis, aspiration, and/or pneumonia. Electronically Signed   By: Feliberto HartsFrederick S Jones MD   On: 03/02/2020 08:20   DG Abdomen Acute W/Chest  Result Date: 02/28/2020 CLINICAL DATA:  Abdominal pain, hypotension, vomiting EXAM: DG ABDOMEN ACUTE WITH 1 VIEW CHEST COMPARISON:  CT 07/08/2019 FINDINGS: Heart size and mediastinal contours are within normal limits. Aortic Atherosclerosis (ICD10-170.0). Lungs hyperinflated with chronic coarse attenuated bronchovascular markings. No focal infiltrate. No free air. Gas distended small bowel loops in the lower abdomen, left greater than right, only slightly increased since previous abdominal radiograph 03/20/2017. Surgical clips in the lower pelvis. The colon is decompressed. There are no abnormal calcifications. Multilevel lumbar spondylitic change. No fracture or worrisome bone lesion. IMPRESSION: 1. Small bowel distention suggesting  ileus or obstruction. 2. No free air. Electronically Signed   By: Corlis Leak  Hassell M.D.   On: 02/28/2020 09:46   DG Abd Portable 1V-Small Bowel Obstruction Protocol-initial, 8 hr  delay  Result Date: 03/01/2020 CLINICAL DATA:  74 year old female with small bowel obstruction. 8 hour delayed image. EXAM: PORTABLE ABDOMEN - 1  VIEW COMPARISON:  CT of the abdomen pelvis dated 02/28/2020 abdominal radiograph dated 03/01/2020 FINDINGS: Enteric tube with tip in the distal stomach. Improved dilatation of the small bowel compared to the prior radiograph now measuring up to 3 cm in the left lower abdomen. No other interval change. IMPRESSION: Improved dilatation of the small bowel compared to prior radiograph. Continued follow-up recommended. Electronically Signed   By: Elgie Collard M.D.   On: 03/01/2020 22:29   MM 3D SCREEN BREAST BILATERAL  Result Date: 02/11/2020 CLINICAL DATA:  Screening. 17 pound weight loss since her most recent prior mammogram in 2020. EXAM: DIGITAL SCREENING BILATERAL MAMMOGRAM WITH TOMO AND CAD COMPARISON:  Previous exam(s). ACR Breast Density Category c: The breast tissue is heterogeneously dense, which may obscure small masses. FINDINGS: In the left breast, a possible asymmetry warrants further evaluation. In the right breast, no findings suspicious for malignancy. Images were processed with CAD. IMPRESSION: Further evaluation is suggested for possible asymmetry in the left breast. RECOMMENDATION: Diagnostic mammogram and possibly ultrasound of the left breast. (Code:[id number]) The patient will be contacted regarding the findings, and additional imaging will be scheduled. BI-RADS CATEGORY  0: Incomplete. Need additional imaging evaluation and/or prior mammograms for comparison. Electronically Signed   By: Hulan Saas M.D.   On: 02/11/2020 11:37   ECHOCARDIOGRAM COMPLETE  Result Date: 02/08/2020    ECHOCARDIOGRAM REPORT   Patient Name:   VIRIGINIA AMENDOLA Date of Exam: 02/08/2020 Medical Rec #:  [id number]        Height:       64.0 in Accession #:    [id number]       Weight:       103.0 lb Date of Birth:  05-Apr-1945        BSA:          1.476 m Patient Age:    74 years         BP:           130/76 mmHg Patient Gender: F                HR:           96 bpm. [facility] Procedure: 2D Echo,  Cardiac Doppler and Color Doppler Indications:    Dyspnea 786.09 / R06.00  History:        Patient has no prior history of Echocardiogram examinations.                 COPD; Risk Factors:Hypertension, Dyslipidemia and Current                 Smoker. GERD.  Sonographer:    Celesta Gentile RCS Referring Phys: [phone] SAMUEL G MCDOWELL IMPRESSIONS  1. Left ventricular ejection fraction, by estimation, is 55 to 60%. The left ventricle has normal function. The left ventricle has no regional wall motion abnormalities. Left ventricular diastolic parameters are indeterminate.  2. Right ventricular systolic function is normal. The right ventricular size is normal. Tricuspid regurgitation signal is inadequate for assessing PA pressure.  3. The mitral valve is abnormal, mildly thickened and calcified with restricted posterior leaflet motion. Trivial mitral valve regurgitation.  4. The aortic valve is tricuspid. Aortic valve regurgitation is mild.  5. The inferior vena cava is normal in size with greater than 50% respiratory variability, suggesting right atrial pressure  of 3 mmHg. FINDINGS  Left Ventricle: Left ventricular ejection fraction, by estimation, is 55 to 60%. The left ventricle has normal function. The left ventricle has no regional wall motion abnormalities. The left ventricular internal cavity size was normal in size. There is  borderline left ventricular hypertrophy. Left ventricular diastolic parameters are indeterminate. Right Ventricle: The right ventricular size is normal. No increase in right ventricular wall thickness. Right ventricular systolic function is normal. Tricuspid regurgitation signal is inadequate for assessing PA pressure. Left Atrium: Left atrial size was normal in size. Right Atrium: Right atrial size was normal in size. Pericardium: There is no evidence of pericardial effusion. Presence of pericardial fat pad. Mitral Valve: The mitral valve is abnormal. There is mild thickening of the mitral valve  leaflet(s). There is mild calcification of the mitral valve leaflet(s). Trivial mitral valve regurgitation. Tricuspid Valve: The tricuspid valve is grossly normal. Tricuspid valve regurgitation is trivial. Aortic Valve: The aortic valve is tricuspid. There is mild aortic valve annular calcification. Aortic valve regurgitation is mild. Aortic regurgitation PHT measures 321 msec. Pulmonic Valve: The pulmonic valve was not well visualized. Pulmonic valve regurgitation is not visualized. Aorta: The aortic root is normal in size and structure. Venous: The inferior vena cava is normal in size with greater than 50% respiratory variability, suggesting right atrial pressure of 3 mmHg. IAS/Shunts: No atrial level shunt detected by color flow Doppler.  LEFT VENTRICLE PLAX 2D LVIDd:         4.00 cm LVIDs:         2.75 cm LV PW:         0.85 cm LV IVS:        0.95 cm LVOT diam:     2.00 cm LV SV:         52 LV SV Index:   35 LVOT Area:     3.14 cm  RIGHT VENTRICLE RV S prime:     9.57 cm/s TAPSE (M-mode): 1.8 cm LEFT ATRIUM             Index       RIGHT ATRIUM          Index LA diam:        2.10 cm 1.42 cm/m  RA Area:     8.20 cm LA Vol (A2C):   50.0 ml 33.88 ml/m RA Volume:   14.80 ml 10.03 ml/m LA Vol (A4C):   41.6 ml 28.19 ml/m LA Biplane Vol: 48.9 ml 33.14 ml/m  AORTIC VALVE LVOT Vmax:   89.40 cm/s LVOT Vmean:  54.300 cm/s LVOT VTI:    0.164 m AI PHT:      321 msec  AORTA Ao Root diam: 3.60 cm MITRAL VALVE MV Area (PHT): 4.06 cm     SHUNTS MV Decel Time: 187 msec     Systemic VTI:  0.16 m MV E velocity: 114.00 cm/s  Systemic Diam: 2.00 cm Nona Dell MD Electronically signed by Nona Dell MD Signature Date/Time: 02/08/2020/4:47:30 PM    Final    [place] EKG SITE RITE  Result Date: 02/28/2020 If Site Rite image not attached, placement could not be confirmed due to current cardiac rhythm.    CBC Recent Labs  Lab 03/01/20 0430 03/02/20 0436 03/04/20 0426 03/06/20 0728  WBC 17.1* 17.2* 8.7 13.4*  HGB  12.7 12.7 13.0 13.0  HCT 37.1 38.2 39.5 40.3  PLT 398 418* 342 273  MCV 97.9 99.5 101.3* 104.7*  MCH 33.5 33.1 33.3 33.8  MCHC  34.2 33.2 32.9 32.3  RDW 12.5 12.7 13.1 13.2  LYMPHSABS  --  0.9 0.7 0.9  MONOABS  --  0.8 1.2* 1.4*  EOSABS  --  0.0 0.0 0.1  BASOSABS  --  0.1 0.0 0.1    Chemistries  Recent Labs  Lab 03/01/20 0430 03/02/20 0436 03/04/20 0426 03/05/20 0631 03/06/20 0728  NA 131* 134* 137 137 137  K 3.0* 3.4* 3.1* 3.8 3.4*  CL 92* 95* 98 106 107  CO2 28 26 26 22  19*  GLUCOSE 91 121* 112* 176* 122*  BUN 41* 24* 21 21 21   CREATININE 0.62 0.69 0.73 0.58 0.51  CALCIUM 8.4* 8.6* 8.3* 8.0* 8.3*  MG  --  1.8 1.5* 2.2 1.7  AST 20 30 30  49* 59*  ALT 12 14 29  42 67*  ALKPHOS 41 50 58 51 58  BILITOT 1.0 0.8 0.7 0.3 0.4   ------------------------------------------------------------------------------------------------------------------ Recent Labs    03/06/20 0728  TRIG 80    No results found for: HGBA1C ------------------------------------------------------------------------------------------------------------------ Recent Labs    03/04/20 2144  TSH 5.007*   Coagulation profile No results for input(s): INR, PROTIME in the last 168 hours.  No results for input(s): DDIMER in the last 72 hours.  Cardiac Enzymes No results for input(s): CKMB, TROPONINI, MYOGLOBIN in the last 168 hours.  Invalid input(s): CK  No results found for: BNP  Shon Hale M.D on 03/07/2020 at 4:57 PM  Go to [email or web address] - for contact info  [facility] - Office  [phone]

## 2020-03-07 NOTE — NC FL2 (Signed)
[place] MEDICAID FL2 LEVEL OF CARE SCREENING TOOL     IDENTIFICATION  Patient Name: Samantha Huerta Birthdate: [birth year] Sex: female Admission Date (Current Location): 02/28/2020  [facility] and [place] Number:  [employer] and Address:  [facility],  [address] [place] [address]      Provider Number: [id number]  Attending Physician Name and Address:  Shon Hale, MD  Relative Name and Phone Number:  Candie Chroman - daughter - [phone]    Current Level of Care: Hospital Recommended Level of Care: Skilled Nursing Facility Prior Approval Number:    Date Approved/Denied:   PASRR Number: [id number]  Discharge Plan: SNF    Current Diagnoses: Patient Active Problem List   Diagnosis Date Noted  . Protein-calorie malnutrition, severe 03/01/2020  . SBO (small bowel obstruction) (HCC) 02/28/2020  . AKI (acute kidney injury) (HCC)   . Hypotension due to hypovolemia   . Opiate dependence (HCC) 12/02/2019  . Controlled substance agreement signed 12/02/2019  . Vitamin D deficiency 03/25/2019  . Chronic midline low back pain without sciatica 12/25/2018  . Heme positive stool 07/31/2017  . Lung nodule 04/03/2017  . Pain management contract signed 01/05/2016  . Chronic back pain 05/12/2013  . Hypertension 07/27/2012  . Hyperlipidemia 07/27/2012  . GERD (gastroesophageal reflux disease) 07/27/2012  . COPD (chronic obstructive pulmonary disease) with chronic bronchitis (HCC) 07/27/2012  . Glaucoma 07/27/2012    Orientation RESPIRATION BLADDER Height & Weight     Self  O2 ([place] 1L) External catheter Weight: 56.3 kg Height:  5\' 5"  (165.1 cm)  BEHAVIORAL SYMPTOMS/MOOD NEUROLOGICAL BOWEL NUTRITION STATUS      Incontinent    AMBULATORY STATUS COMMUNICATION OF NEEDS Skin   Extensive Assist Verbally Normal                       Personal Care Assistance Level of Assistance  Bathing,Feeding,Dressing Bathing Assistance: Maximum  assistance Feeding assistance: Limited assistance Dressing Assistance: Maximum assistance     Functional Limitations Info  Sight,Speech,Hearing Sight Info: Adequate Hearing Info: Adequate Speech Info: Adequate    SPECIAL CARE FACTORS FREQUENCY                       Contractures Contractures Info: Not present    Additional Factors Info  Code Status,Allergies Code Status Info: Full Allergies Info: NKDA           Current Medications (03/07/2020):  This is the current hospital active medication list Current Facility-Administered Medications  Medication Dose Route Frequency Provider Last Rate Last Admin  . acetaminophen (TYLENOL) tablet 650 mg  650 mg Oral Q6H PRN 03/09/2020, MD       Or  . acetaminophen (TYLENOL) suppository 650 mg  650 mg Rectal Q6H PRN Maretta Bees, MD   650 mg at 03/03/20 1232  . albuterol (PROVENTIL) (2.5 MG/3ML) 0.083% nebulizer solution 2.5 mg  2.5 mg Nebulization Q2H PRN Ghimire, 03/05/20, MD      . Ampicillin-Sulbactam (UNASYN) 3 g in sodium chloride 0.9 % 100 mL IVPB  3 g Intravenous Q6H Emokpae, Courage, MD 200 mL/hr at 03/07/20 1646 3 g at 03/07/20 1646  . arformoterol (BROVANA) nebulizer solution 15 mcg  15 mcg Nebulization BID 03/09/20, MD   15 mcg at 03/07/20 0724  . brinzolamide (AZOPT) 1 % ophthalmic suspension 1 drop  1 drop Both Eyes BID 03/09/20, MD   1 drop at 03/07/20  2725  . chlorhexidine (PERIDEX) 0.12 % solution 15 mL  15 mL Mouth Rinse BID Mariea Clonts, Courage, MD   15 mL at 03/07/20 0805  . Chlorhexidine Gluconate Cloth 2 % PADS 6 each  6 each Topical Daily Maretta Bees, MD   6 each at 03/07/20 [phone]  . dexmedetomidine (PRECEDEX) 400 MCG/100ML (4 mcg/mL) infusion  0.4-1.4 mcg/kg/hr Intravenous Titrated Shon Hale, MD   Stopped at 03/07/20 1330  . diltiazem (CARDIZEM) tablet 30 mg  30 mg Oral TID Shon Hale, MD   30 mg at 03/07/20 1649  . feeding supplement (BOOST / RESOURCE BREEZE)  liquid 1 Container  1 Container Oral TID BM Shon Hale, MD   1 Container at 03/06/20 1300  . heparin injection 5,000 Units  5,000 Units Subcutaneous Q8H Maretta Bees, MD   5,000 Units at 03/07/20 1306  . insulin aspart (novoLOG) injection 0-9 Units  0-9 Units Subcutaneous Q6H Shon Hale, MD   1 Units at 03/07/20 0610  . latanoprost (XALATAN) 0.005 % ophthalmic solution 1 drop  1 drop Both Eyes QHS Maretta Bees, MD   1 drop at 03/06/20 2104  . LORazepam (ATIVAN) injection 0.5 mg  0.5 mg Intravenous Q8H PRN Mariea Clonts, Courage, MD   0.5 mg at 03/07/20 0745  . MEDLINE mouth rinse  15 mL Mouth Rinse q12n4p Emokpae, Courage, MD   15 mL at 03/07/20 1606  . morphine 2 MG/ML injection 1 mg  1 mg Intravenous Q2H PRN Maretta Bees, MD   1 mg at 03/07/20 1520  . nicotine (NICODERM CQ - dosed in mg/24 hours) patch 14 mg  14 mg Transdermal Daily Mariea Clonts, Courage, MD   14 mg at 03/07/20 0806  . norepinephrine (LEVOPHED) 4mg  in premix infusion  0-40 mcg/min Intravenous Titrated , MD   Stopped at 03/06/20 0945  . ondansetron (ZOFRAN) tablet 4 mg  4 mg Oral Q6H PRN 03/08/20, MD       Or  . ondansetron [facility]) injection 4 mg  4 mg Intravenous Q6H PRN [facility], MD   4 mg at 02/28/20 2159  . pantoprazole (PROTONIX) injection 40 mg  40 mg Intravenous Q12H 2160, MD   40 mg at 03/07/20 0804  . sodium chloride 0.9 % bolus 250 mL  250 mL Intravenous Q4H PRN 03/09/20, MD 250 mL/hr at 03/03/20 1731 250 mL at 03/03/20 1731  . TPN ADULT (ION)   Intravenous Continuous TPN 03/05/20, MD 80 mL/hr at 03/07/20 1522 Infusion Verify at 03/07/20 1522  . TPN ADULT (ION)   Intravenous Continuous TPN 03/09/20, MD 80 mL/hr at 03/07/20 1652 New Bag at 03/07/20 1652  . umeclidinium bromide (INCRUSE ELLIPTA) 62.5 MCG/INH 1 puff  1 puff Inhalation Daily 03/09/20, MD   1 puff at 03/07/20 03/09/20     Discharge  Medications: Please see discharge summary for a list of discharge medications.  Relevant Imaging Results:  Relevant Lab Results:   Additional Information SS#  [id number]  [id number], RN

## 2020-03-07 NOTE — Progress Notes (Signed)
PHARMACY - TOTAL PARENTERAL NUTRITION CONSULT NOTE   Indication: Small bowel obstruction  Patient Measurements: Height: 5\' 5"  (165.1 cm) Weight: 56.3 kg (124 lb 1.9 oz) IBW/kg (Calculated) : 57 TPN AdjBW (KG): 44.9 Body mass index is 20.65 kg/m.   Assessment:  Severe Malnutrition due to small bowel obstruction with moderate.  Patient with concern of aspiration pneumonia and alcohol withdrawal. 12/28 Small BM's, plan is to continue with TPN.  Glucose / Insulin: 113-133-  3 units insulin given past 24 hrs Electrolytes: K 3.4 Mag 1.7 (K and Magnesium increased in TPN) Renal: WNL LFTs / TGs: WNL Prealbumin / albumin: albumin 2.3 Intake / Output; MIVF:    GI Imaging: CT abdomen/pelvis: High-grade SBO related to adhesions in the proximal ileum  Surgeries / Procedures:  12/20>> right IJ central venous catheter placed by Dr. 1/29 access: 12/20- Central line  TPN start date:  12/24  Nutritional Goals (per RD recommendation on 12/24): kCal: 1/25, Protein: 78-87 g, Fluid: >1300 mL/day Goal TPN rate is 80 mL/hr (provides 80 g of protein and 1360 kcals per day)  Due to shortage of lipids- goal lipid in TPN will be ~ 15% of kcals.  Current Nutrition:  NPO  Plan:  Continue TPN at 80 mL/hr at 1800-  Electrolytes in TPN: 36mEq/L of Na, 44mEq/L of K, 46mEq/L of Ca, 57mEq/L of Mg, and 15 mmol/L of Phos. Cl:Ac ratio 1:1 Add standard MVI, thiamine, folic acid, and trace elements to TPN Initiate Sensitive q6h SSI and adjust as needed  Monitor TPN labs on Mon/Thurs  9m, BS Elder Cyphers, Loura Back Clinical Pharmacist Pager [phone] 03/07/2020 9:08 AM

## 2020-03-07 NOTE — Progress Notes (Signed)
  Afib with RVR with HR > 170 bpm -Cardizem 10 mg x 1 -Followed by p.o. Cardizem 30 mg 3 times daily -Patient echo from 02/08/2020 with EF of 55 to 60% -Left atrial size was normal -No significant valvular disease  Shon Hale, MD

## 2020-03-07 NOTE — Progress Notes (Signed)
[facility] Progress Note     Subjective: Having more BMs. No nausea or vomiting. Tolerating clears.   Objective: Vital signs in last 24 hours: Temp:  [98 F (36.7 C)-98.7 F (37.1 C)] 98.3 F (36.8 C) (12/28 1054) Pulse Rate:  [27-115] 108 (12/28 1100) Resp:  [18-35] 30 (12/28 1100) BP: (86-147)/(47-95) 104/63 (12/28 1100) SpO2:  [69 %-100 %] 99 % (12/28 1100) FiO2 (%):  [24 %-100 %] 24 % (12/28 0728) Weight:  [56.3 kg] 56.3 kg (12/28 0600) Last BM Date: 03/06/20  Intake/Output from previous day: 12/27 0701 - 12/28 0700 In: 2975.2 [P.O.:170; I.V.:2405.3; IV Piggyback:399.9] Out: 1425 [Urine:1125; Emesis/NG output:300] Intake/Output this shift: Total I/O In: 1077.5 [I.V.:877.5; IV Piggyback:200] Out: -   General appearance: alert and no distress Resp: coughing GI: soft, nondistended, nontender  Lab Results:  Recent Labs    03/06/20 0728  WBC 13.4*  HGB 13.0  HCT 40.3  PLT 273   BMET Recent Labs    03/05/20 0631 03/06/20 0728  NA 137 137  K 3.8 3.4*  CL 106 107  CO2 22 19*  GLUCOSE 176* 122*  BUN 21 21  CREATININE 0.58 0.51  CALCIUM 8.0* 8.3*   PT/INR No results for input(s): LABPROT, INR in the last 72 hours.  Studies/Results: No results found.  Anti-infectives: Anti-infectives (From admission, onward)   Start     Dose/Rate Route Frequency Ordered Stop   03/02/20 1830  Ampicillin-Sulbactam (UNASYN) 3 g in sodium chloride 0.9 % 100 mL IVPB        3 g 200 mL/hr over 30 Minutes Intravenous Every 6 hours 03/02/20 1804     02/29/20 2200  vancomycin (VANCOREADY) IVPB 500 mg/100 mL  Status:  Discontinued        500 mg 100 mL/hr over 60 Minutes Intravenous Every 24 hours 02/29/20 0851 02/29/20 1001   02/29/20 1400  piperacillin-tazobactam (ZOSYN) IVPB 3.375 g  Status:  Discontinued        3.375 g 12.5 mL/hr over 240 Minutes Intravenous Every 8 hours 02/29/20 0855 02/29/20 1001   02/28/20 1900  vancomycin (VANCOCIN) IVPB 1000 mg/200  mL premix        1,000 mg 200 mL/hr over 60 Minutes Intravenous  Once 02/28/20 1825 02/29/20 0729   02/28/20 1845  piperacillin-tazobactam (ZOSYN) IVPB 3.375 g  Status:  Discontinued        3.375 g 12.5 mL/hr over 240 Minutes Intravenous Every 12 hours 02/28/20 1831 02/29/20 0855   02/28/20 1830  piperacillin-tazobactam (ZOSYN) IVPB 3.375 g  Status:  Discontinued        3.375 g 100 mL/hr over 30 Minutes Intravenous Every 12 hours 02/28/20 1825 02/28/20 1831   02/28/20 1825  vancomycin variable dose per unstable renal function (pharmacist dosing)  Status:  Discontinued         Does not apply See admin instructions 02/28/20 1825 02/29/20 0851   02/28/20 0930  cefTRIAXone (ROCEPHIN) 2 g in sodium chloride 0.9 % 100 mL IVPB        2 g 200 mL/hr over 30 Minutes Intravenous  Once 02/28/20 0920 02/28/20 1017   02/28/20 0930  metroNIDAZOLE (FLAGYL) IVPB 500 mg        500 mg 100 mL/hr over 60 Minutes Intravenous  Once 02/28/20 0920 02/28/20 1057      Assessment/Plan: Samantha Huerta is a 74 yo with resolving SBO, PNA and withdrawal. Doing ok. NG out Continue to adv diet as tolerated Will sign off  TPN until  taking in more diet    LOS: 8 days    Samantha Huerta 03/07/2020

## 2020-03-07 NOTE — Progress Notes (Signed)
Palliative: Samantha Huerta is lying quietly in bed in a dark room.  As I enter, she weakly states, "morphine".  I asked her what she needs, and she again replies, "morphine".  She tells me that she just wants to go to sleep.  I shared that we need to find a balance between keeping her comfortable and keeping her awake enough to participate in therapies and take in nutrition.  She is able to tell me her name, and that we are in the hospital.  I believe she is able to make her basic needs known.  There is no family at bedside at this time.  Call to daughter, Samantha Huerta.  No answer, unable to leave voicemail.  Quickly received return call from Samantha Huerta.   Nursing staff today shares that Samantha Huerta had a visitor who she described as her husband.   This was actually her former husband, Samantha Huerta. She tells me that she talked with her Huerta about Samantha Huerta's care.  Her former husband was quite concerned about how she looked, her frailty.  He was also concerned that she was not being fed by staff.  Samantha Huerta shares that she ate jello, but no broth or juice.  We talked about meal tray is being delivered, but set up and feeding is done by bedside staff.  Samantha Huerta is very appropriate and was able to clearly name how food trays are managed.  Samantha Huerta share that Samantha Huerta had a hard time yesterday, felt that people were breaking into her house. We talk about how difficult this must be, how distressing.  We talk about diet, intake and weights. We talk about frailty and that sh is here over a week now, and not showing meaningful improvements. We briefly talk about code status, "treat the treatable but allowing natural death".  Samantha Huerta is tearful during this discussion.  I reassure her that it does not seem that Samantha Huerta is in imminent need of life support, but the longer it takes her to recover, the less recovery we usually see.  We plan for a family meeting at bedside Thursday morning to talk about goals of  care.  Plan: Continue to treat the treatable.  PT evaluation when ready.  GOC discussions Thursday morning with daughter at bedside.   40 minutes  Samantha Carmel, NP Palliative Medicine Team  Team Phone [phone] Greater than 50% of this time was spent counseling and coording

## 2020-03-08 DIAGNOSIS — J449 Chronic obstructive pulmonary disease, unspecified: Secondary | ICD-10-CM | POA: Diagnosis not present

## 2020-03-08 DIAGNOSIS — Z7189 Other specified counseling: Secondary | ICD-10-CM | POA: Diagnosis not present

## 2020-03-08 DIAGNOSIS — E43 Unspecified severe protein-calorie malnutrition: Secondary | ICD-10-CM | POA: Diagnosis not present

## 2020-03-08 DIAGNOSIS — K56609 Unspecified intestinal obstruction, unspecified as to partial versus complete obstruction: Secondary | ICD-10-CM | POA: Diagnosis not present

## 2020-03-08 LAB — CBC
HCT: 36.5 % (ref 36.0–46.0)
Hemoglobin: 11.9 g/dL — ABNORMAL LOW (ref 12.0–15.0)
MCH: 33.1 pg (ref 26.0–34.0)
MCHC: 32.6 g/dL (ref 30.0–36.0)
MCV: 101.4 fL — ABNORMAL HIGH (ref 80.0–100.0)
Platelets: 406 10*3/uL — ABNORMAL HIGH (ref 150–400)
RBC: 3.6 MIL/uL — ABNORMAL LOW (ref 3.87–5.11)
RDW: 13.3 % (ref 11.5–15.5)
WBC: 17.8 10*3/uL — ABNORMAL HIGH (ref 4.0–10.5)
nRBC: 0 % (ref 0.0–0.2)

## 2020-03-08 LAB — GLUCOSE, CAPILLARY
Glucose-Capillary: 113 mg/dL — ABNORMAL HIGH (ref 70–99)
Glucose-Capillary: 105 mg/dL — ABNORMAL HIGH (ref 70–99)
Glucose-Capillary: 80 mg/dL (ref 70–99)

## 2020-03-08 LAB — RENAL FUNCTION PANEL
Albumin: 2.2 g/dL — ABNORMAL LOW (ref 3.5–5.0)
Anion gap: 10 (ref 5–15)
BUN: 23 mg/dL (ref 8–23)
CO2: 19 mmol/L — ABNORMAL LOW (ref 22–32)
Calcium: 8.3 mg/dL — ABNORMAL LOW (ref 8.9–10.3)
Chloride: 105 mmol/L (ref 98–111)
Creatinine, Ser: 0.52 mg/dL (ref 0.44–1.00)
GFR, Estimated: >60 mL/min (ref >60)
Glucose, Bld: 114 mg/dL — ABNORMAL HIGH (ref 70–99)
Phosphorus: 3.3 mg/dL (ref 2.5–4.6)
Potassium: 4.1 mmol/L (ref 3.5–5.1)
Sodium: 134 mmol/L — ABNORMAL LOW (ref 135–145)

## 2020-03-08 MED ORDER — ENSURE ENLIVE PO LIQD
237.0000 mL | Freq: Three times a day (TID) | ORAL | Status: DC
  Administered 2020-03-08 – 2020-03-10 (×5): 237 mL via ORAL

## 2020-03-08 MED ORDER — DILTIAZEM HCL 60 MG PO TABS
60.0000 mg | ORAL_TABLET | Freq: Four times a day (QID) | ORAL | Status: DC
  Administered 2020-03-08 – 2020-03-09 (×4): 60 mg via ORAL
  Filled 2020-03-08 (×4): qty 1

## 2020-03-08 MED ORDER — DILTIAZEM HCL 25 MG/5ML IV SOLN
10.0000 mg | Freq: Once | INTRAVENOUS | Status: AC
  Administered 2020-03-08: 10:00:00 10 mg via INTRAVENOUS
  Filled 2020-03-08: qty 5

## 2020-03-08 NOTE — Plan of Care (Signed)
  Problem: Acute Rehab PT Goals(only PT should resolve) Goal: Pt Will Go Supine/Side To Sit Outcome: Progressing Flowsheets (Taken 03/08/2020 1133) Pt will go Supine/Side to Sit:  with min guard assist  with minimal assist Goal: Patient Will Transfer Sit To/From Stand Outcome: Progressing Flowsheets (Taken 03/08/2020 1133) Patient will transfer sit to/from stand:  with minimal assist  with moderate assist Goal: Pt Will Transfer Bed To Chair/Chair To Bed Outcome: Progressing Flowsheets (Taken 03/08/2020 1133) Pt will Transfer Bed to Chair/Chair to Bed: with mod assist Goal: Pt Will Ambulate Outcome: Progressing Flowsheets (Taken 03/08/2020 1133) Pt will Ambulate:  15 feet  with moderate assist  with rolling walker   11:33 AM, 03/08/20 Ocie Bob, MPT Physical Therapist with [facility] [phone] office [phone] mobile phone

## 2020-03-08 NOTE — TOC Progression Note (Signed)
Transition of Care [facility]) - Progression Note    Patient Details  Name: Samantha Huerta MRN: [id number] Date of Birth: June 28, 1945  Transition of [facility]) CM/SW Contact  Leitha Bleak, RN Phone Number: 03/08/2020, 4:17 PM  Clinical Narrative:   Patient is not medically ready, TOC consulted to send out for SNF. Daughter will meet with Rodney Booze Palliative NP tomorrow. TOC started INS AUTH. Waiting on bed offers to discuss with Daughter- [name].    Expected Discharge Plan: Skilled Nursing Facility Barriers to Discharge: Continued Medical Work up  Expected Discharge Plan and Services Expected Discharge Plan: Skilled Nursing Facility In-house Referral: Clinical Social Work     Living arrangements for the past 2 months: Single Family Home                 DME Arranged: N/A    HH Agency: NA     Readmission Risk Interventions Readmission Risk Prevention Plan 03/08/2020 02/29/2020  Transportation Screening - Complete  HRI or Home Care Consult - Complete  Social Work Consult for Recovery Care Planning/Counseling - Complete  Palliative Care Screening Complete Not Applicable  Medication Review Oceanographer) - Complete  Some recent data might be hidden

## 2020-03-08 NOTE — Progress Notes (Signed)
Patient is unable to do IS and flutter at this time.

## 2020-03-08 NOTE — Progress Notes (Signed)
Ms. Samantha Huerta is sitting up in the [facility] chair in her room with a breathing treatment in place.  She continues to appear acutely/chronically ill and very frail.  I reassure her, and meet with her bedside nursing staff for updates.  I return after Ms. Samantha Huerta's breathing treatment has finished.  She continues to appear somewhat sleepy, but is able to take a sip of juice if I hold her cup.  There are no overt signs and symptoms of aspiration.  She is able to tell me her name, but not specifically where we are.  I am not sure that she can express her basic needs.  There is no family at bedside at this time.  It seems that at this point she is qualified for/agreeable to short-term rehab  Conference with attending and bedside nursing staff related to patient condition, needs, goals of care.  Meeting with daughter, Samantha Huerta Thursday morning at bedside for further goals of care discussion.  Plan: Continue to treat the treatable.  At this point is agreeable to short-term rehab.  CODE STATUS discussions to be held with patient and daughter during Thursdays meeting.  25 minutes Lillia Carmel, NP Palliative medicine team Team phone [phone] Greater than 50% of this time was spent counseling and coordinating care related to the above assessment and plan.

## 2020-03-08 NOTE — Progress Notes (Signed)
Nutrition Follow up  DOCUMENTATION CODES:   Severe malnutrition in context of chronic illness  INTERVENTION:  Recommend ST evaluation   D/c Boost Breeze and start Ensure Enlive po TID now that pt is on full liquids  Magic cups TID  Assist with meals as needed and encourage intake  Advance diet as tolerated   NUTRITION DIAGNOSIS:  Severe Malnutrition related to acute illness,chronic illness (SBO and chronic COPD) as evidenced by moderate and severe fat depletion,severe muscle depletion. Weight loss- 8 kg (15%) the past 8 months.  -addressed with TPN (12/25-12-29)  GOAL:  Patient will meet greater than or equal to 90% of their needs   -TPN d/c'd -diet advancing   MONITOR:  PO intake,Supplement acceptance,Labs,Weight trends,Skin,I & O's  REASON FOR ASSESSMENT:   Consult,NPO/Clear Liquid Diet,Other (Comment) (New TPN/TNA)   ASSESSMENT: Patient is an underweight 74 yo female with history of COPD, GERD, HTN, HLD, daily alcohol and tobacco use. She presents from home with complaint of N/V and abdominal pain x 5 days. Hypovolemic shock, AKI, hypokalemia and small bowel obstruction. Receiving pressor support.   12/20- Central line placed. NGT placed right nare-bile, brown output 1.7 liters per nursing.  12/21 Patient discussed during rounds this morning. NPO currently. Patient limited her response to questions. Unable to provide details of diet history. No family present. Chart reviewed.  12/22 NG output high. CIWA protocol.  12/23-urinary retention-373 ml. Foley cath placed. Possible aspiration pna per MD note. 12/24 Patient still no BM- NG output 600 ml has decreased. TPN ordered.  12/25 Pt started TPN. Levophed weaning per MD 12/26 Diet advanced-Clear liquids. Poor intake. TPN at goal rate meeting nutrition needs. 12/27-NGT clamped. Output- 300 ml bile; green per nursing. 12/28-NGT removed. 12/29 Discussed patient with nursing. TPN is discontinued after current bag is  completed. Patient is up in chair today. Patient has 3 cups of various fluids here with sips consumed. Edema noted upper and lower extremities. Acute wt gain 25 lb (11 kg) since admission. Patient at high risk for decline nutritionally given her minimal oral intake-Clear/Full liquid diet.  Per intake review pt is meeting < 50% of est needs via oral intake. Needs assistance with feeding due to significant edema bilateral hands.  Medications reviewed and include: Folvite, MVI,Thimaine  IV-Lactated ringers@ 75 ml/hr -d/c'd  Labs:  BMP Latest Ref Rng & Units 03/08/2020 03/07/2020 03/06/2020  Glucose 70 - 99 mg/dL 818(H) 631(S) 970(Y)  BUN 8 - 23 mg/dL 23 23 21   Creatinine 0.44 - 1.00 mg/dL 6.37 8.58  BUN/Creat Ratio 12 - 28 - - -  Sodium 135 - 145 mmol/L 134(L) 137 137  Potassium 3.5 - 5.1 mmol/L 4.1 3.7 3.4(L)  Chloride 98 - 111 mmol/L 105 108 107  CO2 22 - 32 mmol/L 19(L) 20(L) 19(L)  Calcium 8.9 - 10.3 mg/dL 8.3(L) 7.9(L) 8.3(L)      Intake/Output Summary (Last 24 hours) at 03/08/2020 1313 Last data filed at 03/08/2020 0746 Gross per 24 hour  Intake 1517.02 ml  Output 920 ml  Net 597.02 ml   Patient weight has decreased ~ 2 kg since late September- not significant and down overall 8 kg / 15% since April 30th (8 months).   Diet Order:   Diet Order            Diet full liquid Room service appropriate? Yes; Fluid consistency: Thin  Diet effective now                 EDUCATION NEEDS:  Not appropriate for education at this time   Skin:  Skin Assessment: Reviewed RN Assessment  Last BM:  12/29- liquid stools  Height:   Ht Readings from Last 1 Encounters:  02/28/20 5\' 5"  (1.651 m)    Weight:   Wt Readings from Last 1 Encounters:  03/07/20 56.3 kg  Acute significant wt gain since admission -noted above.  Ideal Body Weight:   57 kg  BMI:  Body mass index is 20.65 kg/m.  Estimated Nutritional Needs:   Kcal:  03/09/20  Protein:  78-87 gr  Fluid:  >1300  ml daily  [id number] MS,RD,CSG,LDN Pager: Royann Shivers

## 2020-03-08 NOTE — Evaluation (Signed)
Clinical/Bedside Swallow Evaluation Patient Details  Name: Samantha Huerta MRN: [id number] Date of Birth: 01-Apr-1945  Today's Date: 03/08/2020 Time: SLP Start Time (ACUTE ONLY): 1415 SLP Stop Time (ACUTE ONLY): 1433 SLP Time Calculation (min) (ACUTE ONLY): 18 min  Past Medical History:  Past Medical History:  Diagnosis Date  . Anxiety   . Arthritis   . COPD (chronic obstructive pulmonary disease) (HCC)   . GERD (gastroesophageal reflux disease)   . Glaucoma 1998   Dr Carlynn Purl in [facility]  . Hyperlipidemia   . Hypertension   . Osteopenia    Past Surgical History:  Past Surgical History:  Procedure Laterality Date  . ABDOMINAL HYSTERECTOMY    . CARPAL TUNNEL RELEASE Bilateral   . COLONOSCOPY WITH PROPOFOL N/A 10/20/2017   Procedure: COLONOSCOPY WITH PROPOFOL;  Surgeon: Corbin Ade, MD;  Location: [facility];  Service: Endoscopy;  Laterality: N/A;  1:45pm  . ELBOW SURGERY Right   . SHOULDER SURGERY Left    HPI:  Samantha Huerta is a 74 y.o. female with medical history significant of glaucoma, HTN, COPD, chronic back pain-presented to the ED with nausea/vomiting/abdominal pain x5 days.  Per patient-she has had intractable nausea and vomiting for the past few days-vomiting is mostly bilious and at times dark.  She has not noted any blood in the vomitus.  She complains of colicky abdominal pain-10/10 as well.  Her last bowel movement was approximately 4-5 days back, her last flatus was approximately 3 days back.  Upon presentation to the emergency room-she was noted to be hypotensive-labs showed AKI-CT imaging showed high-grade SBO.  Hospitalist service was asked to admit this patient for further evaluation and treatment. BSE requested. Pt is on full liquids.   Assessment / Plan / Recommendation Clinical Impression  Clinical swallow evaluation completed at bedside- assessed with ice chips, thin liquids, and puree due to full liquid diet order at this time. Pt with xerostomia and  upper dentures. She presents with reduced vocal intensity and weak, congested cough, however no overt signs or symptoms of aspiration exhibited. Ok to continue diet as ordered. SLP will follow. SLP Visit Diagnosis: Dysphagia, unspecified (R13.10)    Aspiration Risk  Mild aspiration risk    Diet Recommendation Dysphagia 1 (Puree);Thin liquid   Liquid Administration via: Cup;Straw Medication Administration: Whole meds with liquid Supervision: Patient able to self feed;Staff to assist with self feeding Compensations: Slow rate Postural Changes: Seated upright at 90 degrees;Remain upright for at least 30 minutes after po intake    Other  Recommendations Oral Care Recommendations: Oral care BID;Staff/trained caregiver to provide oral care Other Recommendations: Clarify dietary restrictions   Follow up Recommendations  (pending)      Frequency and Duration min 2x/week  1 week       Prognosis Prognosis for Safe Diet Advancement: Good      Swallow Study   General Date of Onset: 02/28/20 HPI: Samantha Huerta is a 74 y.o. female with medical history significant of glaucoma, HTN, COPD, chronic back pain-presented to the ED with nausea/vomiting/abdominal pain x5 days.  Per patient-she has had intractable nausea and vomiting for the past few days-vomiting is mostly bilious and at times dark.  She has not noted any blood in the vomitus.  She complains of colicky abdominal pain-10/10 as well.  Her last bowel movement was approximately 4-5 days back, her last flatus was approximately 3 days back.  Upon presentation to the emergency room-she was noted to be hypotensive-labs showed AKI-CT  imaging showed high-grade SBO.  Hospitalist service was asked to admit this patient for further evaluation and treatment. BSE requested. Pt is on full liquids. Type of Study: Bedside Swallow Evaluation Diet Prior to this Study: Thin liquids;Dysphagia 1 (puree) (full liquids) Temperature Spikes Noted:  No Respiratory Status: Room air History of Recent Intubation: No Behavior/Cognition: Alert;Cooperative;Pleasant mood Oral Cavity Assessment: Dry Oral Care Completed by SLP: Yes Oral Cavity - Dentition: Dentures, top Vision: Impaired for self-feeding Self-Feeding Abilities: Needs assist (bilateral UE edema) Patient Positioning: Upright in bed Baseline Vocal Quality: Low vocal intensity Volitional Cough: Congested;Weak Volitional Swallow: Able to elicit    Oral/Motor/Sensory Function Overall Oral Motor/Sensory Function: Within functional limits   Ice Chips Ice chips: Within functional limits Presentation: Spoon   Thin Liquid Thin Liquid: Within functional limits Presentation: Straw    Nectar Thick Nectar Thick Liquid: Not tested   Honey Thick Honey Thick Liquid: Not tested   Puree Puree: Within functional limits Presentation: Spoon   Solid     Solid: Not tested (Pt on full liquids)     Thank you,  Samantha Huerta, CCC-SLP [phone]  Samantha Huerta 03/08/2020,2:41 PM

## 2020-03-08 NOTE — Progress Notes (Signed)
Patient Demographics:    Samantha Huerta, is a 74 y.o. female, DOB - October 07, 1945, BSJ:[id number]  Admit date - 02/28/2020   Admitting Physician Samantha Huerta Samantha Dredge, MD  Outpatient Primary MD for the patient is Samantha Spencer, FNP  LOS - 9   Chief Complaint  Patient presents with  . Nausea  . Weakness        Subjective:    Samantha Huerta no respiratory distress, no cough, no chest pain.  She still very weak and tired.  She has nonfocal pains.  Better tolerating oral intake today.        Assessment  & Plan :    Principal Problem:   SBO (small bowel obstruction) (HCC) Active Problems:   Hypertension   GERD (gastroesophageal reflux disease)   COPD (chronic obstructive pulmonary disease) with chronic bronchitis (HCC)   Glaucoma   Chronic midline low back pain without sciatica   AKI (acute kidney injury) (HCC)   Hypotension due to hypovolemia   Protein-calorie malnutrition, severe  Brief Narrative: Patient is a 74 y.o. female with PMHx of COPD, HTN, glaucoma, chronic back pain, tobacco use, alcohol abuse-presented with 5-day history of abdominal pain, vomiting.  She was found to have hypovolemic shock, AKI and high-grade bowel obstruction.  She was kept n.p.o.-NGT was placed-IV fluid resuscitation was started-she was subsequently admitted to the ICU.  Even with IV fluid resuscitation-she remained hypotensive-hence right IJ central venous access was obtained-and patient was started on pressors.   Delirium tremens improving    Assessment/Plan: Hypovolemic shock: Due to severe dehydration/vomiting- -patient developed worsening hypotension with increasing lactic acid levels, --Received IV fluid boluses and   Levophed--  -Able to come off IV Levophed on 03/07/2020  -Empiric Vanco and Zosyn discontinued on 02/29/2020 -Blood cultures from 02/28/2020 and repeat blood cultures from 03/01/2020  NGTD -IV Unasyn started on 03/02/2020 for aspiration pneumonia -Continue Augmentin  -Sepsis secondary to presumed Aspiration Pneumonia --leukocytosis resolved,  -Continue augmentin -Sepsis pathophysiology resolved, off Levophed   Alcohol Abuse/alcohol withdrawal delirium: --Alcohol withdrawal symptoms/delirium tremens- Resolving Came off Precedex drip on 03/07/2020 -Okay to use as needed Ativan PTA pt drank significant amount of vodka daily-claims last drink was on 12/18- -    New Onset Atrial fibrillation with RVR Afib with RVR with HR > 170 bpm  HR improving -Continue cardizem, increase dose - echo from 02/08/2020 with EF of 55 to 60% -Left atrial size was normal -No significant valvular disease -Hold off on anticoagulation for now -- CHA2DS2-Vasc 3.  AKI: Hemodynamically mediated due to vomiting and lisinopril use.  UA without proteinuria, CT abdomen without hydronephrosis. -Creatinine was 2.0 on admission -AKI has resolved -renally adjust medications, avoid nephrotoxic agents / dehydration  / hypotension  -Urinary retention--patient failed voiding trial on 03/01/2020,  foley in situ--reinserted 03/02/2020 due to urinary retention  Hyponatremia/hypomagnesemia/hypokalemia: In an alcoholic female -replace and recheck electrolytes, -Monitor electrolytes carefully with TPN infusion  SBO: Resolved.  TOlerating full liquid diet today -Follow-up x-rays without significant bowel dilatation -Small bowel follow-through on 03/01/2020  -Stop NG     COPD: Stable-continue bronchodilators.  Glaucoma: Continue eyedrops.  Tobacco abuse:use transdermal nicotine.   Chronic back pain: On as needed narcotics  Urinary retention--  foley in situ--reinserted 03/02/2020 after  patient failed voiding trial due to urinary retention    Diet Orders (From admission, onward)    Start     Ordered   03/08/20 0829  Diet full liquid Room service appropriate? Yes; Fluid  consistency: Thin  Diet effective now       Question Answer Comment  Room service appropriate? Yes   Fluid consistency: Thin      03/08/20 0829         Significant events: 12/20>> admit for hypovolemic shock, AKI in the setting of bowel obstruction. 12/20>> persistently hypotensive in spite of IVF resuscitation-right IJ placed by surgery-started on Levophed -TPN started on 03/04/2020 -Went into atrial fibrillation on 03/07/2020 -TPN stopped 12/29  Significant studies: 12/20>> CT abdomen/pelvis: High-grade SBO related to adhesions in the proximal ileum 12/20>> chest x-ray: No pneumonia (personally reviewed) 12/21- abd xray with SBO  Antimicrobial therapy: Vancomycin: 12/20>> 12/21 Zosyn: 12/20>> 12/21 Ceftriaxone: 12/20 x 1 in ED Flagyl: 12/20 x 1 in ED -Unasyn started on 03/02/2020  Microbiology data: 12/20>> blood cultures: NGTD 03/01/20 Repeat Blood cx NGTD  Procedures : 12/20>> right IJ central venous catheter placed by Dr. Henreitta Leber  Consults: General surgery  DVT Prophylaxis : heparin injection 5,000 Units Start: 02/28/20 1400  Disposition/Need for in-Hospital Stay- patietn from home but significantly debilitated and unable to tolerate oral intake at present.    We will advance diet, transition to oral diltiazem and stop all infusions and likely to SNF in 1-2 days   Status is: Inpatient  Remains inpatient appropriate because:Please see above   Disposition: The patient is from: Home              Anticipated d/c is to: SNF              Anticipated d/c date is: 2 days              Patient currently is not medically stable to d/c. Barriers: Not Clinically Stable-   Code Status : -  Code Status: Full Code   Family Communication:     Consults  :  Gen surg  DVT Prophylaxis  :   - SCDs  heparin injection 5,000 Units Start: 02/28/20 1400  Lab Results  Component Value Date   PLT 406 (H) 03/08/2020    Inpatient Medications  Scheduled Meds: .  amoxicillin-clavulanate  1 tablet Oral Q12H  . arformoterol  15 mcg Nebulization BID  . brinzolamide  1 drop Both Eyes BID  . chlorhexidine  15 mL Mouth Rinse BID  . Chlorhexidine Gluconate Cloth  6 each Topical Daily  . diltiazem  60 mg Oral Q6H  . feeding supplement  237 mL Oral TID BM  . heparin  5,000 Units Subcutaneous Q8H  . insulin aspart  0-9 Units Subcutaneous Q6H  . latanoprost  1 drop Both Eyes QHS  . mouth rinse  15 mL Mouth Rinse q12n4p  . nicotine  14 mg Transdermal Daily  . pantoprazole (PROTONIX) IV  40 mg Intravenous Q12H  . umeclidinium bromide  1 puff Inhalation Daily   Continuous Infusions:  PRN Meds:.acetaminophen **OR** acetaminophen, albuterol, LORazepam, morphine injection, ondansetron **OR** ondansetron (ZOFRAN) IV   Anti-infectives (From admission, onward)   Start     Dose/Rate Route Frequency Ordered Stop   03/08/20 1000  amoxicillin-clavulanate (AUGMENTIN) 875-125 MG per tablet 1 tablet        1 tablet Oral Every 12 hours 03/07/20 1821 03/09/20 2159   03/02/20 1830  Ampicillin-Sulbactam (UNASYN) 3 g in sodium  chloride 0.9 % 100 mL IVPB        3 g 200 mL/hr over 30 Minutes Intravenous Every 6 hours 03/02/20 1804 03/07/20 2359   02/29/20 2200  vancomycin (VANCOREADY) IVPB 500 mg/100 mL  Status:  Discontinued        500 mg 100 mL/hr over 60 Minutes Intravenous Every 24 hours 02/29/20 0851 02/29/20 1001   02/29/20 1400  piperacillin-tazobactam (ZOSYN) IVPB 3.375 g  Status:  Discontinued        3.375 g 12.5 mL/hr over 240 Minutes Intravenous Every 8 hours 02/29/20 0855 02/29/20 1001   02/28/20 1900  vancomycin (VANCOCIN) IVPB 1000 mg/200 mL premix        1,000 mg 200 mL/hr over 60 Minutes Intravenous  Once 02/28/20 1825 02/29/20 0729   02/28/20 1845  piperacillin-tazobactam (ZOSYN) IVPB 3.375 g  Status:  Discontinued        3.375 g 12.5 mL/hr over 240 Minutes Intravenous Every 12 hours 02/28/20 1831 02/29/20 0855   02/28/20 1830  piperacillin-tazobactam  (ZOSYN) IVPB 3.375 g  Status:  Discontinued        3.375 g 100 mL/hr over 30 Minutes Intravenous Every 12 hours 02/28/20 1825 02/28/20 1831   02/28/20 1825  vancomycin variable dose per unstable renal function (pharmacist dosing)  Status:  Discontinued         Does not apply See admin instructions 02/28/20 1825 02/29/20 0851   02/28/20 0930  cefTRIAXone (ROCEPHIN) 2 g in sodium chloride 0.9 % 100 mL IVPB        2 g 200 mL/hr over 30 Minutes Intravenous  Once 02/28/20 0920 02/28/20 1017   02/28/20 0930  metroNIDAZOLE (FLAGYL) IVPB 500 mg        500 mg 100 mL/hr over 60 Minutes Intravenous  Once 02/28/20 0920 02/28/20 1057        Objective:   Vitals:   03/08/20 1530 03/08/20 1600 03/08/20 1700 03/08/20 1744  BP:  104/63 120/68   Pulse:      Resp: (!) 30 (!) 26 (!) 26   Temp:    (!) 97.1 F (36.2 C)  TempSrc:    Axillary  SpO2:      Weight:      Height:        Wt Readings from Last 3 Encounters:  03/07/20 56.3 kg  02/01/20 46.7 kg  12/02/19 48.1 kg    Intake/Output Summary (Last 24 hours) at 03/08/2020 1749 Last data filed at 03/08/2020 1500 Gross per 24 hour  Intake 1330.77 ml  Output 1870 ml  Net -539.23 ml   Physical Exam Gen Exam: Awake but appears very tired, lying in bed, no obvious distress HEENT: Anicteric, conjunctival pink, lids and lashes normal.  No nasal deformity, discharge, or epistaxis.  Teeth cracked, but otherwise dentition normal.  Oropharynx tacky dry, no oral lesions  Neck -- Rt IJ central line still in place Chest: Respiratory effort normal, respirations shallow, no rales or wheezing appreciated CVS: Tachycardic, irregular, no JVD, no lower extremity edema, no murmurs Abdomen: Abdomen soft no tenderness palpation or guarding, no ascites or distention Extremities: Edema, pedal pulses normal Neurology:  Extraocular movements intact, moves upper extremities with generalized weakness, but symmetric strength, severely generalized weak Psych-oriented  to self, independent hospital, situation, affect blunted, attention slightly diminished     Data Review:   Micro Results Recent Results (from the past 240 hour(s))  Urine culture     Status: None   Collection Time: 02/28/20  9:13 AM  Specimen: In/Out Cath Urine  Result Value Ref Range Status   Specimen Description   Final    IN/OUT CATH URINE Performed at [facility], [address] [place], [place] [address]    Special Requests   Final    NONE Performed at [facility], [address] [place], [place] [address]    Culture   Final    NO GROWTH Performed at [facility], [address] [place], [place] [address]    Report Status 02/29/2020 FINAL  Final  Blood Culture (routine x 2)     Status: None   Collection Time: 02/28/20  9:14 AM   Specimen: BLOOD  Result Value Ref Range Status   Specimen Description BLOOD RIGHT ANTECUBITAL  Final   Special Requests   Final    BOTTLES DRAWN AEROBIC AND ANAEROBIC Blood Culture results may not be optimal due to an inadequate volume of blood received in culture bottles   Culture   Final    NO GROWTH 5 DAYS Performed at [facility], [address] [place], [place] [address]    Report Status 03/04/2020 FINAL  Final  Resp Panel by RT-PCR (Flu A&B, Covid) Nasopharyngeal Swab     Status: None   Collection Time: 02/28/20  9:20 AM   Specimen: Nasopharyngeal Swab; Nasopharyngeal(NP) swabs in vial transport medium  Result Value Ref Range Status   SARS Coronavirus 2 by RT PCR NEGATIVE NEGATIVE Final    Comment: (NOTE) SARS-CoV-2 target nucleic acids are NOT DETECTED.  The SARS-CoV-2 RNA is generally detectable in upper respiratory specimens during the acute phase of infection. The lowest concentration of SARS-CoV-2 viral copies this assay can detect is 138 copies/mL. A negative result does not preclude SARS-Cov-2 infection and should not be used as the sole basis for treatment or other patient management decisions. A negative result  may occur with  improper specimen collection/handling, submission of specimen other than nasopharyngeal swab, presence of viral mutation(s) within the areas targeted by this assay, and inadequate number of viral copies(<138 copies/mL). A negative result must be combined with clinical observations, patient history, and epidemiological information. The expected result is Negative.  Fact Sheet for Patients:  BloggerCourse.comhttps://www.fda.gov/media/152166/download  Fact Sheet for Healthcare Providers:  SeriousBroker.ithttps://www.fda.gov/media/152162/download  This test is no t yet approved or cleared by the [place] FDA and  has been authorized for detection and/or diagnosis of SARS-CoV-2 by FDA under an Emergency Use Authorization (EUA). This EUA will remain  in effect (meaning this test can be used) for the duration of the COVID-19 declaration under Section 564(b)(1) of the Act, 21 U.S.C.section 360bbb-3(b)(1), unless the authorization is terminated  or revoked sooner.       Influenza A by PCR NEGATIVE NEGATIVE Final   Influenza B by PCR NEGATIVE NEGATIVE Final    Comment: (NOTE) The Xpert Xpress SARS-CoV-2/FLU/RSV plus assay is intended as an aid in the diagnosis of influenza from Nasopharyngeal swab specimens and should not be used as a sole basis for treatment. Nasal washings and aspirates are unacceptable for Xpert Xpress SARS-CoV-2/FLU/RSV testing.  Fact Sheet for Patients: BloggerCourse.comhttps://www.fda.gov/media/152166/download  Fact Sheet for Healthcare Providers: SeriousBroker.ithttps://www.fda.gov/media/152162/download  This test is not yet approved or cleared by the [place] FDA and has been authorized for detection and/or diagnosis of SARS-CoV-2 by FDA under an Emergency Use Authorization (EUA). This EUA will remain in effect (meaning this test can be used) for the duration of the COVID-19 declaration under Section 564(b)(1) of the Act, 21 U.S.C. section 360bbb-3(b)(1),  unless the authorization is terminated  or revoked.  Performed at [facility], [address], [place], [place] [address]   Blood Culture (routine x 2)     Status: None   Collection Time: 02/28/20  9:57 AM   Specimen: BLOOD LEFT FOREARM  Result Value Ref Range Status   Specimen Description BLOOD LEFT FOREARM  Final   Special Requests   Final    BOTTLES DRAWN AEROBIC AND ANAEROBIC Blood Culture results may not be optimal due to an inadequate volume of blood received in culture bottles   Culture   Final    NO GROWTH 5 DAYS Performed at [facility], [address] [place], [place] [address]    Report Status 03/04/2020 FINAL  Final  MRSA PCR Screening     Status: Abnormal   Collection Time: 02/28/20  2:35 PM   Specimen: Nasopharyngeal  Result Value Ref Range Status   MRSA by PCR POSITIVE (A) NEGATIVE Final    Comment:        The GeneXpert MRSA Assay (FDA approved for NASAL specimens only), is one component of a comprehensive MRSA colonization surveillance program. It is not intended to diagnose MRSA infection nor to guide or monitor treatment for MRSA infections. RESULT CALLED TO, READ BACK BY AND VERIFIED WITH: SHELTON,A AT 1632 BY HUFFINES,S ON 02/28/20. Performed at [facility], [address], [place], [place] [address]   Culture, blood (Routine X 2) w Reflex to ID Panel     Status: None   Collection Time: 03/01/20 11:33 AM   Specimen: BLOOD  Result Value Ref Range Status   Specimen Description BLOOD BLOOD RIGHT ARM  Final   Special Requests   Final    BOTTLES DRAWN AEROBIC AND ANAEROBIC Blood Culture adequate volume   Culture   Final    NO GROWTH 5 DAYS Performed at [facility], [address] [place], [place] [address]    Report Status 03/06/2020 FINAL  Final  Culture, blood (Routine X 2) w Reflex to ID Panel     Status: None   Collection Time: 03/01/20 11:33 AM   Specimen: BLOOD  Result Value Ref Range Status   Specimen Description BLOOD BLOOD LEFT ARM  Final   Special Requests   Final    BOTTLES  DRAWN AEROBIC AND ANAEROBIC Blood Culture results may not be optimal due to an inadequate volume of blood received in culture bottles   Culture   Final    NO GROWTH 5 DAYS Performed at [facility], [address] [place], [place] [address]    Report Status 03/06/2020 FINAL  Final    Radiology Reports CT ABDOMEN PELVIS WO CONTRAST  Result Date: 02/28/2020 CLINICAL DATA:  Nausea and vomiting for several days EXAM: CT ABDOMEN AND PELVIS WITHOUT CONTRAST TECHNIQUE: Multidetector CT imaging of the abdomen and pelvis was performed following the standard protocol without IV contrast. COMPARISON:  None. FINDINGS: Lower chest: No acute abnormality. Hepatobiliary: No focal liver abnormality is seen. No gallstones, gallbladder wall thickening, or biliary dilatation. Pancreas: Unremarkable. No pancreatic ductal dilatation or surrounding inflammatory changes. Spleen: Normal in size without focal abnormality. Adrenals/Urinary Tract: Adrenal glands are within normal limits. Kidneys are well visualized bilaterally. No renal calculi or obstructive changes are seen. Bladder is decompressed. Stomach/Bowel: Colon shows diverticular change without evidence of obstructive or inflammatory change. Colon is predominately decompressed although dense material is noted within the colon which may be related to ingested material. The appendix is not visualized and surgical clips  are noted likely related to prior removal. The stomach is significantly distended with fluid as is the small bowel throughout the jejunum in into the proximal ileum. There is a transition zone identified in the mid abdomen best seen on image number 44 of series 2 and image number 41 of series 5. This is likely related to adhesions as no definitive mass is seen. The more distal small bowel is unremarkable. Vascular/Lymphatic: Aortic atherosclerosis. No enlarged abdominal or pelvic lymph nodes. Reproductive: Status post hysterectomy. No adnexal masses. Other:  No abdominal wall hernia or abnormality. No abdominopelvic ascites. Musculoskeletal: Degenerative changes of lumbar spine are noted. No acute compression deformity is noted. IMPRESSION: High-grade small bowel obstruction likely related to adhesions in the proximal ileum. More distal small bowel and colon are decompressed. No other focal abnormality is noted. Electronically Signed   By: Alcide Clever M.D.   On: 02/28/2020 11:10   DG Abd 1 View  Result Date: 03/05/2020 CLINICAL DATA:  Small-bowel obstruction EXAM: ABDOMEN - 1 VIEW COMPARISON:  03/03/2000 FINDINGS: Nasogastric tube in the decompressed stomach. Small bowel and colon appear decompressed. Surgical clips in the right pelvis. Multilevel lumbar spondylitic change. IMPRESSION: Nasogastric tube in the stomach. Electronically Signed   By: Corlis Leak M.D.   On: 03/05/2020 08:17   DG Abd 1 View  Result Date: 03/01/2020 CLINICAL DATA:  Small bowel obstruction EXAM: ABDOMEN - 1 VIEW COMPARISON:  CT 2 days ago FINDINGS: Ongoing small bowel obstruction with dilated loops in the central abdomen measuring up to 3.8 cm. The degree of distension is improved, but no new colonic gas is seen. No gross pneumoperitoneum. Clear lung bases. IMPRESSION: Ongoing small bowel obstruction with improved small bowel distension. Electronically Signed   By: Marnee Spring M.D.   On: 03/01/2020 07:44   DG Chest Port 1 View  Result Date: 02/28/2020 CLINICAL DATA:  74 year old female status post central line placement. EXAM: PORTABLE CHEST 1 VIEW COMPARISON:  Chest radiograph dated 04/05/2016 and CT dated 07/08/2019. FINDINGS: Right IJ central venous line with tip over central SVC. Enteric tube with tip in the distal stomach. There is background of emphysema. No focal consolidation, pleural effusion or pneumothorax. The cardiac silhouette is within limits. Atherosclerotic calcification of the aorta. Osteopenia with degenerative changes of the spine and shoulders. Old healed  right hip fractures. No acute osseous pathology. IMPRESSION: Right IJ central venous line with tip over central SVC. Electronically Signed   By: Elgie Collard M.D.   On: 02/28/2020 18:46   DG ABD ACUTE 2+V W 1V CHEST  Result Date: 03/05/2020 CLINICAL DATA:  Small-bowel obstruction, nausea, weakness EXAM: DG ABDOMEN ACUTE WITH 1 VIEW CHEST COMPARISON:  Earlier abdominal radiograph 0800 hours, chest radiograph 02/29/2020 FINDINGS: Nasogastric tube extends into stomach. RIGHT jugular line tip projecting over SVC. Normal heart size, mediastinal contours, and pulmonary vascularity. Atherosclerotic calcification aorta. Atelectasis versus consolidation LEFT lower lobe new since prior study. Small LEFT pleural effusion. Underlying emphysematous changes. No pneumothorax. Nonobstructive bowel gas pattern. No bowel dilatation or wall thickening. No free air. Bones demineralized with BILATERAL chronic rotator cuff tears and degenerative disc disease changes of the thoracic/lumbar spine. IMPRESSION: Nonobstructive bowel gas pattern. Atelectasis versus consolidation LEFT lower lobe new since 02/28/2020 with small associated LEFT pleural effusion. Electronically Signed   By: Ulyses Southward M.D.   On: 03/05/2020 13:04   DG ABD ACUTE 2+V W 1V CHEST  Result Date: 03/03/2020 CLINICAL DATA:  Small-bowel obstruction, COPD, hypertension, smoker EXAM: DG ABDOMEN ACUTE  WITH 1 VIEW CHEST COMPARISON:  03/02/2020 FINDINGS: Tip of RIGHT jugular line projects over SVC. Nasogastric tube extends into stomach. Normal heart size, mediastinal contours, and pulmonary vascularity. Severe aorta Emphysematous and bronchitic changes consistent with COPD. Atelectasis versus consolidation LEFT lower lobe slightly increased. Remaining lungs free of acute infiltrate. No pleural effusion or pneumothorax. Single prominent small bowel loop in the mid abdomen. Questionable gastric wall thickening versus artifact from underdistention. No bowel wall  thickening or free air. Multilevel degenerative disc disease changes lumbar spine. Degenerative changes RIGHT hip joint. Surgical clips in pelvis. IMPRESSION: COPD changes with atelectasis versus consolidation LEFT lower lobe. Single prominent small bowel loop in the mid abdomen with decreased small bowel distension since previous study. Questionable gastric wall thickening versus artifact from underdistention. Electronically Signed   By: Ulyses Southward M.D.   On: 03/03/2020 09:47   DG ABD ACUTE 2+V W 1V CHEST  Result Date: 03/02/2020 CLINICAL DATA:  Small-bowel obstruction. EXAM: DG ABDOMEN ACUTE WITH 1 VIEW CHEST COMPARISON:  March 01, 2020. FINDINGS: Mild left basilar opacities with partial silhouetting of the left hemidiaphragm. No visible pleural effusions or pneumothorax. Stable cardiomediastinal silhouette. Enteric tube courses below the diaphragm with the tip in the distal stomach/peripyloric region, unchanged. Right central venous catheter with the tip projecting in the region of the superior cavoatrial junction. Slight increase in small bowel dilation with small bowel measuring up to 4 cm in the left lower abdomen. No other interval change. No gross pneumoperitoneum on this supine radiograph. Surgical clips projecting over the lower abdomen/pelvis. Polyarticular degenerative change. IMPRESSION: 1. Slight increase in small bowel dilation. 2. Mild left basilar opacities, which may represent atelectasis, aspiration, and/or pneumonia. Electronically Signed   By: Feliberto Harts MD   On: 03/02/2020 08:20   DG Abdomen Acute W/Chest  Result Date: 02/28/2020 CLINICAL DATA:  Abdominal pain, hypotension, vomiting EXAM: DG ABDOMEN ACUTE WITH 1 VIEW CHEST COMPARISON:  CT 07/08/2019 FINDINGS: Heart size and mediastinal contours are within normal limits. Aortic Atherosclerosis (ICD10-170.0). Lungs hyperinflated with chronic coarse attenuated bronchovascular markings. No focal infiltrate. No free air. Gas  distended small bowel loops in the lower abdomen, left greater than right, only slightly increased since previous abdominal radiograph 03/20/2017. Surgical clips in the lower pelvis. The colon is decompressed. There are no abnormal calcifications. Multilevel lumbar spondylitic change. No fracture or worrisome bone lesion. IMPRESSION: 1. Small bowel distention suggesting  ileus or obstruction. 2. No free air. Electronically Signed   By: Corlis Leak M.D.   On: 02/28/2020 09:46   DG Abd Portable 1V-Small Bowel Obstruction Protocol-initial, 8 hr delay  Result Date: 03/01/2020 CLINICAL DATA:  74 year old female with small bowel obstruction. 8 hour delayed image. EXAM: PORTABLE ABDOMEN - 1 VIEW COMPARISON:  CT of the abdomen pelvis dated 02/28/2020 abdominal radiograph dated 03/01/2020 FINDINGS: Enteric tube with tip in the distal stomach. Improved dilatation of the small bowel compared to the prior radiograph now measuring up to 3 cm in the left lower abdomen. No other interval change. IMPRESSION: Improved dilatation of the small bowel compared to prior radiograph. Continued follow-up recommended. Electronically Signed   By: Elgie Collard M.D.   On: 03/01/2020 22:29   ECHOCARDIOGRAM COMPLETE  Result Date: 02/08/2020    ECHOCARDIOGRAM REPORT   Patient Name:   CHLOEANN ALFRED Date of Exam: 02/08/2020 Medical Rec #:  [id number]        Height:       64.0 in Accession #:    [id number]  Weight:       103.0 lb Date of Birth:  [birth year]        BSA:          1.476 m Patient Age:    74 years         BP:           130/76 mmHg Patient Gender: F                HR:           96 bpm. [facility] Procedure: 2D Echo, Cardiac Doppler and Color Doppler Indications:    Dyspnea 786.09 / R06.00  History:        Patient has no prior history of Echocardiogram examinations.                 COPD; Risk Factors:Hypertension, Dyslipidemia and Current                 Smoker. GERD.  Sonographer:    Celesta Gentile RCS  Referring Phys: [phone] SAMUEL G MCDOWELL IMPRESSIONS  1. Left ventricular ejection fraction, by estimation, is 55 to 60%. The left ventricle has normal function. The left ventricle has no regional wall motion abnormalities. Left ventricular diastolic parameters are indeterminate.  2. Right ventricular systolic function is normal. The right ventricular size is normal. Tricuspid regurgitation signal is inadequate for assessing PA pressure.  3. The mitral valve is abnormal, mildly thickened and calcified with restricted posterior leaflet motion. Trivial mitral valve regurgitation.  4. The aortic valve is tricuspid. Aortic valve regurgitation is mild.  5. The inferior vena cava is normal in size with greater than 50% respiratory variability, suggesting right atrial pressure of 3 mmHg. FINDINGS  Left Ventricle: Left ventricular ejection fraction, by estimation, is 55 to 60%. The left ventricle has normal function. The left ventricle has no regional wall motion abnormalities. The left ventricular internal cavity size was normal in size. There is  borderline left ventricular hypertrophy. Left ventricular diastolic parameters are indeterminate. Right Ventricle: The right ventricular size is normal. No increase in right ventricular wall thickness. Right ventricular systolic function is normal. Tricuspid regurgitation signal is inadequate for assessing PA pressure. Left Atrium: Left atrial size was normal in size. Right Atrium: Right atrial size was normal in size. Pericardium: There is no evidence of pericardial effusion. Presence of pericardial fat pad. Mitral Valve: The mitral valve is abnormal. There is mild thickening of the mitral valve leaflet(s). There is mild calcification of the mitral valve leaflet(s). Trivial mitral valve regurgitation. Tricuspid Valve: The tricuspid valve is grossly normal. Tricuspid valve regurgitation is trivial. Aortic Valve: The aortic valve is tricuspid. There is mild aortic valve annular  calcification. Aortic valve regurgitation is mild. Aortic regurgitation PHT measures 321 msec. Pulmonic Valve: The pulmonic valve was not well visualized. Pulmonic valve regurgitation is not visualized. Aorta: The aortic root is normal in size and structure. Venous: The inferior vena cava is normal in size with greater than 50% respiratory variability, suggesting right atrial pressure of 3 mmHg. IAS/Shunts: No atrial level shunt detected by color flow Doppler.  LEFT VENTRICLE PLAX 2D LVIDd:         4.00 cm LVIDs:         2.75 cm LV PW:         0.85 cm LV IVS:        0.95 cm LVOT diam:     2.00 cm LV SV:  52 LV SV Index:   35 LVOT Area:     3.14 cm  RIGHT VENTRICLE RV S prime:     9.57 cm/s TAPSE (M-mode): 1.8 cm LEFT ATRIUM             Index       RIGHT ATRIUM          Index LA diam:        2.10 cm 1.42 cm/m  RA Area:     8.20 cm LA Vol (A2C):   50.0 ml 33.88 ml/m RA Volume:   14.80 ml 10.03 ml/m LA Vol (A4C):   41.6 ml 28.19 ml/m LA Biplane Vol: 48.9 ml 33.14 ml/m  AORTIC VALVE LVOT Vmax:   89.40 cm/s LVOT Vmean:  54.300 cm/s LVOT VTI:    0.164 m AI PHT:      321 msec  AORTA Ao Root diam: 3.60 cm MITRAL VALVE MV Area (PHT): 4.06 cm     SHUNTS MV Decel Time: 187 msec     Systemic VTI:  0.16 m MV E velocity: 114.00 cm/s  Systemic Diam: 2.00 cm Nona Dell MD Electronically signed by Nona Dell MD Signature Date/Time: 02/08/2020/4:47:30 PM    Final    [place] EKG SITE RITE  Result Date: 02/28/2020 If Site Rite image not attached, placement could not be confirmed due to current cardiac rhythm.    CBC Recent Labs  Lab 03/02/20 0436 03/04/20 0426 03/06/20 0728 03/08/20 0539  WBC 17.2* 8.7 13.4* 17.8*  HGB 12.7 13.0 13.0 11.9*  HCT 38.2 39.5 40.3 36.5  PLT 418* 342 273 406*  MCV 99.5 101.3* 104.7* 101.4*  MCH 33.1 33.3 33.8 33.1  MCHC 33.2 32.9 32.3 32.6  RDW 12.7 13.1 13.2 13.3  LYMPHSABS 0.9 0.7 0.9  --   MONOABS 0.8 1.2* 1.4*  --   EOSABS 0.0 0.0 0.1  --   BASOSABS 0.1 0.0  0.1  --     Chemistries  Recent Labs  Lab 03/02/20 0436 03/04/20 0426 03/05/20 0631 03/06/20 0728 03/07/20 1713 03/08/20 0539  NA 134* 137 137 137 137 134*  K 3.4* 3.1* 3.8 3.4* 3.7 4.1  CL 95* 98 106 107 108 105  CO2 26 26 22  19* 20* 19*  GLUCOSE 121* 112* 176* 122* 145* 114*  BUN 24* 21 21 21 23 23   CREATININE 0.69 0.73 0.58 0.51 0.49 0.52  CALCIUM 8.6* 8.3* 8.0* 8.3* 7.9* 8.3*  MG 1.8 1.5* 2.2 1.7 1.6*  --   AST 30 30 49* 59*  --   --   ALT 14 29 42 67*  --   --   ALKPHOS 50 58 51 58  --   --   BILITOT 0.8 0.7 0.3 0.4  --   --    ------------------------------------------------------------------------------------------------------------------ Recent Labs    03/06/20 0728  TRIG 80    No results found for: HGBA1C ------------------------------------------------------------------------------------------------------------------ No results for input(s): TSH, T4TOTAL, T3FREE, THYROIDAB in the last 72 hours.  Invalid input(s): FREET3 Coagulation profile No results for input(s): INR, PROTIME in the last 168 hours.  No results for input(s): DDIMER in the last 72 hours.  Cardiac Enzymes No results for input(s): CKMB, TROPONINI, MYOGLOBIN in the last 168 hours.  Invalid input(s): CK  No results found for: BNP  Alberteen Sam M.D on 03/08/2020 at 5:49 PM  Go to [email or web address] - for contact info  [facility] - Office  [phone]

## 2020-03-08 NOTE — Evaluation (Signed)
Physical Therapy Evaluation Patient Details Name: Samantha Huerta MRN: [id number] DOB: 1945-04-13 Today's Date: 03/08/2020   History of Present Illness  Samantha Huerta is a 74 y.o. female with medical history significant of glaucoma, HTN, COPD, chronic back pain-presented to the ED with nausea/vomiting/abdominal pain x5 days.  Per patient-she has had intractable nausea and vomiting for the past few days-vomiting is mostly bilious and at times dark.  She has not noted any blood in the vomitus.  She complains of colicky abdominal pain-10/10 as well.  Her last bowel movement was approximately 4-5 days back, her last flatus was approximately 3 days back.  Upon presentation to the emergency room-she was noted to be hypotensive-labs showed AKI-CT imaging showed high-grade SBO.  Hospitalist service was asked to admit this patient for further evaluation and treatment.    Clinical Impression  Patient demonstrates slow labored movement for sitting up at bedside requiring Mod assistance, unable to maintain standing balance or take steps using RW due to weakness, required stand pivot with knees blocked to transfer to chair and tolerated staying up in chair after therapy.  Patient will benefit from continued physical therapy in hospital and recommended venue below to increase strength, balance, endurance for safe ADLs and gait.     Follow Up Recommendations SNF    Equipment Recommendations  None recommended by PT    Recommendations for Other Services       Precautions / Restrictions Precautions Precautions: Fall Restrictions Weight Bearing Restrictions: No      Mobility  Bed Mobility Overal bed mobility: Needs Assistance Bed Mobility: Supine to Sit     Supine to sit: Mod assist     General bed mobility comments: increased time, labored movement    Transfers Overall transfer level: Needs assistance Equipment used: Rolling walker (2 wheeled);1 person hand held assist Transfers: Sit  to/from [employer] Transfers Sit to Stand: Max assist Stand pivot transfers: Max assist       General transfer comment: Patient unable to transfer using RW due to weakness, required stand pivot with knees blocked to transfer to chair  Ambulation/Gait                Stairs            Wheelchair Mobility    Modified Rankin (Stroke Patients Only)       Balance Overall balance assessment: Needs assistance Sitting-balance support: Feet supported;No upper extremity supported Sitting balance-Leahy Scale: Fair Sitting balance - Comments: seated at EOB   Standing balance support: During functional activity;Bilateral upper extremity supported Standing balance-Leahy Scale: Poor Standing balance comment: using RW                             Pertinent Vitals/Pain Pain Assessment: No/denies pain    Home Living Family/patient expects to be discharged to:: Private residence Living Arrangements: Alone Available Help at Discharge: Family;Available PRN/intermittently Type of Home: House Home Access: Level entry     Home Layout: One level Home Equipment: Grab bars - tub/shower;Shower seat;Cane - quad;Walker - 2 wheels      Prior Function Level of Independence: Independent with assistive device(s)         Comments: Community ambulator using quad-cane PRN, drives     Hand Dominance        Extremity/Trunk Assessment   Upper Extremity Assessment Upper Extremity Assessment: Generalized weakness    Lower Extremity Assessment Lower Extremity Assessment: Generalized weakness  Cervical / Trunk Assessment Cervical / Trunk Assessment: Normal  Communication   Communication: No difficulties  Cognition Arousal/Alertness: Awake/alert Behavior During Therapy: WFL for tasks assessed/performed Overall Cognitive Status: Within Functional Limits for tasks assessed                                        General Comments       Exercises     Assessment/Plan    PT Assessment Patient needs continued PT services  PT Problem List Decreased strength;Decreased activity tolerance;Decreased balance;Decreased mobility       PT Treatment Interventions Balance training;DME instruction;Gait training;Stair training;Functional mobility training;Therapeutic activities;Therapeutic exercise;Patient/family education    PT Goals (Current goals can be found in the Care Plan section)  Acute Rehab PT Goals Patient Stated Goal: return home able to take care of self PT Goal Formulation: With patient Time For Goal Achievement: 03/22/20 Potential to Achieve Goals: Good    Frequency Min 3X/week   Barriers to discharge        Co-evaluation               AM-PAC PT "6 Clicks" Mobility  Outcome Measure Help needed turning from your back to your side while in a flat bed without using bedrails?: A Lot Help needed moving from lying on your back to sitting on the side of a flat bed without using bedrails?: A Lot Help needed moving to and from a bed to a chair (including a wheelchair)?: A Lot Help needed standing up from a chair using your arms (e.g., wheelchair or bedside chair)?: A Lot Help needed to walk in hospital room?: Total Help needed climbing 3-5 steps with a railing? : Total 6 Click Score: 10    End of Session   Activity Tolerance: Patient tolerated treatment well;Patient limited by fatigue Patient left: in chair;with call bell/phone within reach Nurse Communication: Mobility status PT Visit Diagnosis: Unsteadiness on feet (R26.81);Other abnormalities of gait and mobility (R26.89);Muscle weakness (generalized) (M62.81)    Time: 5681-2751 PT Time Calculation (min) (ACUTE ONLY): 33 min   Charges:   PT Evaluation $PT Eval Moderate Complexity: 1 Mod PT Treatments $Therapeutic Activity: 23-37 mins        11:31 AM, 03/08/20 Ocie Bob, MPT Physical Therapist with [facility] 336  [phone] office [phone] mobile phone

## 2020-03-09 DIAGNOSIS — K56609 Unspecified intestinal obstruction, unspecified as to partial versus complete obstruction: Secondary | ICD-10-CM | POA: Diagnosis not present

## 2020-03-09 DIAGNOSIS — E43 Unspecified severe protein-calorie malnutrition: Secondary | ICD-10-CM | POA: Diagnosis not present

## 2020-03-09 DIAGNOSIS — Z7189 Other specified counseling: Secondary | ICD-10-CM | POA: Diagnosis not present

## 2020-03-09 DIAGNOSIS — Z515 Encounter for palliative care: Secondary | ICD-10-CM | POA: Diagnosis not present

## 2020-03-09 LAB — COMPREHENSIVE METABOLIC PANEL
ALT: 31 U/L (ref 0–44)
AST: 22 U/L (ref 15–41)
Albumin: 2.2 g/dL — ABNORMAL LOW (ref 3.5–5.0)
Alkaline Phosphatase: 89 U/L (ref 38–126)
Anion gap: 11 (ref 5–15)
BUN: 14 mg/dL (ref 8–23)
CO2: 19 mmol/L — ABNORMAL LOW (ref 22–32)
Calcium: 8.4 mg/dL — ABNORMAL LOW (ref 8.9–10.3)
Chloride: 104 mmol/L (ref 98–111)
Creatinine, Ser: 0.46 mg/dL (ref 0.44–1.00)
GFR, Estimated: >60 mL/min (ref >60)
Glucose, Bld: 87 mg/dL (ref 70–99)
Potassium: 3.8 mmol/L (ref 3.5–5.1)
Sodium: 134 mmol/L — ABNORMAL LOW (ref 135–145)
Total Bilirubin: 0.5 mg/dL (ref 0.3–1.2)
Total Protein: 5.3 g/dL — ABNORMAL LOW (ref 6.5–8.1)

## 2020-03-09 LAB — CBC
HCT: 32.3 % — ABNORMAL LOW (ref 36.0–46.0)
Hemoglobin: 10.7 g/dL — ABNORMAL LOW (ref 12.0–15.0)
MCH: 33.2 pg (ref 26.0–34.0)
MCHC: 33.1 g/dL (ref 30.0–36.0)
MCV: 100.3 fL — ABNORMAL HIGH (ref 80.0–100.0)
Platelets: 425 10*3/uL — ABNORMAL HIGH (ref 150–400)
RBC: 3.22 MIL/uL — ABNORMAL LOW (ref 3.87–5.11)
RDW: 13.4 % (ref 11.5–15.5)
WBC: 14.3 10*3/uL — ABNORMAL HIGH (ref 4.0–10.5)
nRBC: 0 % (ref 0.0–0.2)

## 2020-03-09 LAB — RESP PANEL BY RT-PCR (FLU A&B, COVID) ARPGX2
Influenza A by PCR: NEGATIVE
Influenza B by PCR: NEGATIVE
SARS Coronavirus 2 by RT PCR: NEGATIVE

## 2020-03-09 LAB — GLUCOSE, CAPILLARY
Glucose-Capillary: 75 mg/dL (ref 70–99)
Glucose-Capillary: 81 mg/dL (ref 70–99)
Glucose-Capillary: 81 mg/dL (ref 70–99)
Glucose-Capillary: 88 mg/dL (ref 70–99)

## 2020-03-09 LAB — MAGNESIUM: Magnesium: 1.4 mg/dL — ABNORMAL LOW (ref 1.7–2.4)

## 2020-03-09 LAB — PHOSPHORUS: Phosphorus: 3.1 mg/dL (ref 2.5–4.6)

## 2020-03-09 MED ORDER — DILTIAZEM HCL 60 MG PO TABS
90.0000 mg | ORAL_TABLET | Freq: Four times a day (QID) | ORAL | Status: DC
  Administered 2020-03-09 – 2020-03-10 (×3): 90 mg via ORAL
  Filled 2020-03-09 (×3): qty 1

## 2020-03-09 MED ORDER — MAGNESIUM SULFATE 2 GM/50ML IV SOLN
2.0000 g | Freq: Once | INTRAVENOUS | Status: AC
  Administered 2020-03-09: 09:00:00 2 g via INTRAVENOUS
  Filled 2020-03-09: qty 50

## 2020-03-09 MED ORDER — APIXABAN 5 MG PO TABS
5.0000 mg | ORAL_TABLET | Freq: Two times a day (BID) | ORAL | Status: DC
  Administered 2020-03-09 – 2020-03-10 (×2): 5 mg via ORAL
  Filled 2020-03-09 (×2): qty 1

## 2020-03-09 MED ORDER — FUROSEMIDE 10 MG/ML IJ SOLN
20.0000 mg | Freq: Once | INTRAMUSCULAR | Status: AC
  Administered 2020-03-09: 12:00:00 20 mg via INTRAVENOUS
  Filled 2020-03-09: qty 2

## 2020-03-09 MED ORDER — ENOXAPARIN SODIUM 40 MG/0.4ML ~~LOC~~ SOLN: 40.0000 mg | SUBCUTANEOUS | Status: DC

## 2020-03-09 MED ORDER — PANTOPRAZOLE SODIUM 40 MG PO TBEC
40.0000 mg | DELAYED_RELEASE_TABLET | Freq: Every day | ORAL | Status: DC
  Administered 2020-03-10: 40 mg via ORAL
  Filled 2020-03-09: qty 1

## 2020-03-09 NOTE — Progress Notes (Signed)
Palliative: [name] is sitting up in the [place] chair in her room.  She greets me making and somewhat keeping eye contact.  She continues to appear acutely/chronically ill and frail, but looks improved from the last few days.  She is alert and oriented to person place and situation, and able to make her needs known.  Although a family meeting was scheduled for this morning with daughter [name], there is no family at bedside at this time.  [name] and I talked about what has happened and the treatment plan.  She denies questions or concerns at this time.  We talked about her weakness.  She tells me that she is agreeable to short-term rehab if the cost is covered by insurance.  I share that a bed has been offered at a local SNF which accepts her insurance.  She tells me that she is agreeable.   We talked about healthcare power of attorney.  [name] states that she has a daughter [name], and a son [name]..  She states that [name] usually makes healthcare choices for her.    We also talked about CODE STATUS.  We talked about "treat the treatable, but allowing natural death".  [name] states that she would not want to be on life support.  She shares that she has not had these discussions with her daughter.  It would be beneficial for [facility] to hear CODE STATUS discussions with her mother.  Outpatient palliative to follow to continue goalsetting and discussions about the "what if's and maybe's".  Conference with attending, bedside nursing staff, transition of care team related to patient condition, needs, goals of care.  I return later in the afternoon.  [name] is sitting up in the [place] chair in her room.  She continues to look quite frail, but again, is much improved.  Daughter is not at bedside.  Conference with transition of care team who shares that daughter, [name], will not be in [place] until late afternoon/early evening. PMT to continue to follow.  Plan: Continue to treat the  treatable.  Agreeable to short-term rehab at [facility].  Outpatient palliative to follow for further goalsetting and CODE STATUS discussions.  50 minutes Lillia Carmel, NP Palliative medicine team Team phone [phone] Greater than 50% of this time was spent counseling and coordinating care related to the above assessment and plan.

## 2020-03-09 NOTE — Progress Notes (Signed)
Physical Therapy Treatment Patient Details Name: Samantha Huerta MRN: [id number] DOB: [birth year] Today's Date: 03/09/2020    History of Present Illness Samantha Huerta is a 74 y.o. female with medical history significant of glaucoma, HTN, COPD, chronic back pain-presented to the ED with nausea/vomiting/abdominal pain x5 days.  Per patient-she has had intractable nausea and vomiting for the past few days-vomiting is mostly bilious and at times dark.  She has not noted any blood in the vomitus.  She complains of colicky abdominal pain-10/10 as well.  Her last bowel movement was approximately 4-5 days back, her last flatus was approximately 3 days back.  Upon presentation to the emergency room-she was noted to be hypotensive-labs showed AKI-CT imaging showed high-grade SBO.  Hospitalist service was asked to admit this patient for further evaluation and treatment.    PT Comments    Patient demonstrates slow labored movement for sitting up at bedside with most difficulty scooting to EOB due to swelling/weakness bilateral hands and wrists, unable to fully extend trunk when standing due weakness or transfer using RW due to legs giving way.  Patient required stand pivot with knees blocked to transfer to chair.  Patient tolerated sitting up in chair after therapy - nursing staff aware.  Patient will benefit from continued physical therapy in hospital and recommended venue below to increase strength, balance, endurance for safe ADLs and gait.     Follow Up Recommendations  SNF     Equipment Recommendations  None recommended by PT    Recommendations for Other Services       Precautions / Restrictions Precautions Precautions: Fall Restrictions Weight Bearing Restrictions: No    Mobility  Bed Mobility Overal bed mobility: Needs Assistance Bed Mobility: Supine to Sit     Supine to sit: Mod assist;HOB elevated     General bed mobility comments: increased time, labored movement, limited use  of hands due to swelling/weakness  Transfers Overall transfer level: Needs assistance Equipment used: Rolling walker (2 wheeled);1 person hand held assist Transfers: Sit to/from [employer] Sit to Stand: Max assist Stand pivot transfers: Max assist       General transfer comment: Patient unable to transfer using RW due to weakness, required stand pivot with knees blocked to transfer to chair  Ambulation/Gait Ambulation/Gait assistance: Max assist Gait Distance (Feet): 1 Feet Assistive device: Rolling walker (2 wheeled) Gait Pattern/deviations: Decreased step length - right;Decreased step length - left;Decreased stride length;Decreased stance time - right;Decreased stance time - left;Trunk flexed Gait velocity: slow   General Gait Details: limited to 1 steps due to poor balance and unable to fully extend trunk   Stairs             Wheelchair Mobility    Modified Rankin (Stroke Patients Only)       Balance Overall balance assessment: Needs assistance Sitting-balance support: Feet supported;No upper extremity supported Sitting balance-Leahy Scale: Fair Sitting balance - Comments: seated at EOB   Standing balance support: During functional activity;Bilateral upper extremity supported Standing balance-Leahy Scale: Poor Standing balance comment: using RW                            Cognition Arousal/Alertness: Awake/alert Behavior During Therapy: WFL for tasks assessed/performed Overall Cognitive Status: Within Functional Limits for tasks assessed  Exercises General Exercises - Lower Extremity Long Arc Quad: Seated;AROM;Strengthening;Both;10 reps Hip Flexion/Marching: Seated;AROM;Strengthening;Both;10 reps Toe Raises: Seated;AROM;Strengthening;Both;10 reps Heel Raises: Seated;AROM;Strengthening;Both;10 reps    General Comments        Pertinent Vitals/Pain Pain Assessment:  Faces Faces Pain Scale: Hurts little more Pain Location: hands wrist due to swelling Pain Descriptors / Indicators: Sore;Discomfort Pain Intervention(s): Limited activity within patient's tolerance;Monitored during session;Repositioned    Home Living                      Prior Function            PT Goals (current goals can now be found in the care plan section) Acute Rehab PT Goals Patient Stated Goal: return home able to take care of self PT Goal Formulation: With patient Time For Goal Achievement: 03/22/20 Potential to Achieve Goals: Good Progress towards PT goals: Progressing toward goals    Frequency    Min 3X/week      PT Plan Current plan remains appropriate    Co-evaluation              AM-PAC PT "6 Clicks" Mobility   Outcome Measure  Help needed turning from your back to your side while in a flat bed without using bedrails?: A Lot Help needed moving from lying on your back to sitting on the side of a flat bed without using bedrails?: A Lot Help needed moving to and from a bed to a chair (including a wheelchair)?: A Lot Help needed standing up from a chair using your arms (e.g., wheelchair or bedside chair)?: A Lot Help needed to walk in hospital room?: Total Help needed climbing 3-5 steps with a railing? : Total 6 Click Score: 10    End of Session   Activity Tolerance: Patient tolerated treatment well;Patient limited by fatigue Patient left: in chair;with call bell/phone within reach Nurse Communication: Mobility status PT Visit Diagnosis: Unsteadiness on feet (R26.81);Other abnormalities of gait and mobility (R26.89);Muscle weakness (generalized) (M62.81)     Time: 4235-3614 PT Time Calculation (min) (ACUTE ONLY): 23 min  Charges:  $Therapeutic Exercise: 8-22 mins $Therapeutic Activity: 8-22 mins                     11:41 AM, 03/09/20 Ocie Bob, MPT Physical Therapist with [facility] 336 [phone]  office [phone] mobile phone

## 2020-03-09 NOTE — TOC Progression Note (Addendum)
Transition of Care [facility]) - Progression Note    Patient Details  Name: Samantha Huerta MRN: [id number] Date of Birth: 12-05-1945  Transition of Care [facility]) CM/SW Contact  Leitha Bleak, RN Phone Number: 03/09/2020, 1:47 PM  Clinical Narrative:   Palliative consult completed. Patient accepting bed offer from [facility]. [name] not present. TOC called she is in route from [facility] to hospital. She is agreeing with Bed offer. TPN has been stopped, Patient on soft diet , doing much better. TOC updated [facility]. Authorization given [id number]. COVID test ordered. Plan to discharge to [facility] tomorrow. Updated Debbie.     Expected Discharge Plan: Skilled Nursing Facility Barriers to Discharge: Continued Medical Work up  Expected Discharge Plan and Services Expected Discharge Plan: Skilled Nursing Facility In-house Referral: Clinical Social Work     Living arrangements for the past 2 months: Single Family Home                 DME Arranged: N/A     HH Agency: NA    Readmission Risk Interventions Readmission Risk Prevention Plan 03/08/2020 02/29/2020  Transportation Screening - Complete  HRI or Home Care Consult - Complete  Social Work Consult for Recovery Care Planning/Counseling - Complete  Palliative Care Screening Complete Not Applicable  Medication Review [employer]) - Complete  Some recent data might be hidden

## 2020-03-09 NOTE — Progress Notes (Signed)
[facility] PROGRESS NOTE    LAMOINE FREDRICKSEN  QQV:[id number] DOB: [birth year] DOA: 02/28/2020 PCP: [name], FNP      Brief Narrative:  Mrs. Racette is a 74 y.o. F with COPD not on home O2, HTN, alcohol use disorder who presented with several days of progressive nausea, vomiting, and abdominal pain.  In the ER found to be hypotensive, with acute kidney injury and high-grade small bowel obstruction on CT.  Further hospitalization complicated by hypovolemic shock, alcohol withdrawal and severe delirium.       Assessment & Plan:  Small bowel obstruction Admitted and NG tube placed.  General Surgery consulted.  Conservative management recommended and resolved.  Now tolerating oral diet well.    Hypovolemic shock Patient admitted and developed hypotension refractory to IV fluids requiring Levophed. Cultures negative. Empiric antibiotics discontinued after 48 hours. Resolved.  Sepsis, due to aspiration pneumonia Later developed leukocytosis, tachycardia, found to have aspiration pneumonia.  Started on Unasyn. 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 Last drink 12/18.  Here, patient had severe delirium requiring Precedex infusion. Now weaned off Precedex, and delirium is resolved. The patient has some residual cognitive impairment, which is likely resolving encephalopathy, probably superimposed on some previously undiagnosed alcoholic dementia.  New onset atrial fibrillation with RVR Patient with new onset atrial fibrillation in the setting of alcohol withdrawal. Echocardiogram normal.  TSH not reduced.  CHA2DS2-Vasc 3, HASBLED 4, less if she is able to abstain alcohol. -Continue Cardizem, increased dose -Start apixaban  COPD No active flare. -Continue LABA,  LAMA  Swelling  Edema from fluids.   -Furosemide once -BMP tomorrow  Hyperglycemia No history diabetes, clugoses now normalized. -Stop insulin        Disposition: Status is: Inpatient  Remains inpatient appropriate because:Unsafe d/c plan   Dispo: The patient is from: Home              Anticipated d/c is to: SNF              Anticipated d/c date is: 1 day              Patient currently is not medically stable to d/c.              MDM: The below labs and imaging reports were reviewed and summarized above.  Medication management as above.  Anticoagulation managed.   DVT prophylaxis: Apixban  Code Status: FULL Family Communication: Daughter by phone, apixaban discused risks and benefits discussed.         Subjective: Patient has no chest pain.  She still has a wet cough.  She is still very weak.  She is still confused.  No fever overnight.  No vomiting.  No diarrhea.  No abdominal pain.  No headache.  No chest pain.  Objective: Vitals:   03/09/20 0700 03/09/20 0725 03/09/20 0800 03/09/20 0900  BP: 121/80  (!) 143/69 121/66  Pulse:   (!) 108   Resp: 19  (!) 28 (!) 31  Temp:  97.6 F (36.4 C)    TempSrc:  Oral    SpO2:   99%   Weight:      Height:        Intake/Output Summary (Last 24 hours) at 03/09/2020 1138 Last data filed at 03/08/2020 2000 Gross per 24 hour  Intake --  Output  1250 ml  Net -1250 ml   Filed Weights   03/05/20 0544 03/06/20 0441 03/07/20 0600  Weight: 52.4 kg 55.7 kg 56.3 kg    Examination: General appearance: Thin elderly adult female, alert and in no obvious distress.  Sitting up in recliner HEENT: Anicteric, conjunctiva pink, lids and lashes normal. No nasal deformity, discharge, epistaxis.  Lips moist dentition normal, oropharynx moist, no oral lesions, hearing normal.   Skin: Warm and dry.  No jaundice.  No suspicious rashes or lesions. Cardiac: Tachycardic, regular, nl S1-S2, no murmurs appreciated.   Capillary refill is brisk.  JVP normal.  no LE edema.  Radial  pulses 2+ and symmetric. Respiratory: Normal respiratory rate and rhythm.  Coarse cough but lung sounds clear without rales or wheezes.   Abdomen: Abdomen soft.  No TTP or guarding. No ascites, distension, hepatosplenomegaly.   MSK: No deformities or effusions. Neuro: Awake and alert, but somewhat confused, oriented to self, needs prompting to state that she is in the hospital, but remember she has any pain in December, otherwise makes some tangential and inappropriate remarks at times.  EOMI, moves all extremities with severe generalized weakness. Speech fluent.    Psych: Sensorium intact and responding to questions, attention diminished, affect blunted, judgment insight appear impaired       Data Reviewed: I have personally reviewed following labs and imaging studies:  CBC: Recent Labs  Lab 03/04/20 0426 03/06/20 0728 03/08/20 0539 03/09/20 0504  WBC 8.7 13.4* 17.8* 14.3*  NEUTROABS 6.4 10.5*  --   --   HGB 13.0 13.0 11.9* 10.7*  HCT 39.5 40.3 36.5 32.3*  MCV 101.3* 104.7* 101.4* 100.3*  PLT 342 273 406* 425*   Basic Metabolic Panel: Recent Labs  Lab 03/04/20 0426 03/05/20 0631 03/06/20 0728 03/07/20 1713 03/08/20 0539 03/09/20 0504  NA 137 137 137 137 134* 134*  K 3.1* 3.8 3.4* 3.7 4.1 3.8  CL 98 106 107 108 105 104  CO2 26 22 19* 20* 19* 19*  GLUCOSE 112* 176* 122* 145* 114* 87  BUN 21 21 21 23 23 14   CREATININE 0.73 0.58 0.51 0.49 0.52 0.46  CALCIUM 8.3* 8.0* 8.3* 7.9* 8.3* 8.4*  MG 1.5* 2.2 1.7 1.6*  --  1.4*  PHOS 2.9 2.6 3.4  --  3.3 3.1   GFR: Estimated Creatinine Clearance: 54 mL/min (by C-G formula based on SCr of 0.46 mg/dL). Liver Function Tests: Recent Labs  Lab 03/04/20 0426 03/05/20 0631 03/06/20 0728 03/08/20 0539 03/09/20 0504  AST 30 49* 59*  --  22  ALT 29 42 67*  --  31  ALKPHOS 58 51 58  --  89  BILITOT 0.7 0.3 0.4  --  0.5  PROT 5.5* 5.2* 5.2*  --  5.3*  ALBUMIN 2.6* 2.4*  2.3* 2.2* 2.2*   No results for input(s): LIPASE, AMYLASE in the last 168 hours. No results for input(s): AMMONIA in the last 168 hours. Coagulation Profile: No results for input(s): INR, PROTIME in the last 168 hours. Cardiac Enzymes: No results for input(s): CKTOTAL, CKMB, CKMBINDEX, TROPONINI in the last 168 hours. BNP (last 3 results) No results for input(s): PROBNP in the last 8760 hours. HbA1C: No results for input(s): HGBA1C in the last 72 hours. CBG: Recent Labs  Lab 03/08/20 1125 03/08/20 1612 03/09/20 0005 03/09/20 0454 03/09/20 1110  GLUCAP 105* 80 81 88 81   Lipid Profile: No results for input(s): CHOL, HDL, LDLCALC, TRIG, CHOLHDL, LDLDIRECT in the last 72 hours. Thyroid  Function Tests: No results for input(s): TSH, T4TOTAL, FREET4, T3FREE, THYROIDAB in the last 72 hours. Anemia Panel: No results for input(s): VITAMINB12, FOLATE, FERRITIN, TIBC, IRON, RETICCTPCT in the last 72 hours. Urine analysis:    Component Value Date/Time   COLORURINE AMBER (A) 02/28/2020 2200   APPEARANCEUR HAZY (A) 02/28/2020 2200   APPEARANCEUR Clear 03/20/2017 1112   LABSPEC 1.021 02/28/2020 2200   PHURINE 5.0 02/28/2020 2200   GLUCOSEU NEGATIVE 02/28/2020 2200   HGBUR NEGATIVE 02/28/2020 2200   BILIRUBINUR SMALL (A) 02/28/2020 2200   BILIRUBINUR Negative 03/20/2017 1112   KETONESUR 5 (A) 02/28/2020 2200   PROTEINUR NEGATIVE 02/28/2020 2200   UROBILINOGEN negative 01/25/2014 1529   NITRITE NEGATIVE 02/28/2020 2200   LEUKOCYTESUR NEGATIVE 02/28/2020 2200   Sepsis Labs: @LABRCNTIP (procalcitonin:4,lacticacidven:4)  ) Recent Results (from the past 240 hour(s))  MRSA PCR Screening     Status: Abnormal   Collection Time: 02/28/20  2:35 PM   Specimen: Nasopharyngeal  Result Value Ref Range Status   MRSA by PCR POSITIVE (A) NEGATIVE Final    Comment:        The GeneXpert MRSA Assay (FDA approved for NASAL specimens only), is one component of a comprehensive MRSA  colonization surveillance program. It is not intended to diagnose MRSA infection nor to guide or monitor treatment for MRSA infections. RESULT CALLED TO, READ BACK BY AND VERIFIED WITH: SHELTON,A AT 1632 BY HUFFINES,S ON 02/28/20. Performed at [facility], [address], [place], [place]   Culture, blood (Routine X 2) w Reflex to ID Panel     Status: None   Collection Time: 03/01/20 11:33 AM   Specimen: BLOOD  Result Value Ref Range Status   Specimen Description BLOOD BLOOD RIGHT ARM  Final   Special Requests   Final    BOTTLES DRAWN AEROBIC AND ANAEROBIC Blood Culture adequate volume   Culture   Final    NO GROWTH 5 DAYS Performed at [facility], [address] [place], [place]    Report Status 03/06/2020 FINAL  Final  Culture, blood (Routine X 2) w Reflex to ID Panel     Status: None   Collection Time: 03/01/20 11:33 AM   Specimen: BLOOD  Result Value Ref Range Status   Specimen Description BLOOD BLOOD LEFT ARM  Final   Special Requests   Final    BOTTLES DRAWN AEROBIC AND ANAEROBIC Blood Culture results may not be optimal due to an inadequate volume of blood received in culture bottles   Culture   Final    NO GROWTH 5 DAYS Performed at [facility], [address], [place], [place]    Report Status 03/06/2020 FINAL  Final         Radiology Studies: No results found.      Scheduled Meds: . arformoterol  15 mcg Nebulization BID  . brinzolamide  1 drop Both Eyes BID  . chlorhexidine  15 mL Mouth Rinse BID  . Chlorhexidine Gluconate Cloth  6 each Topical Daily  . diltiazem  90 mg Oral Q6H  . enoxaparin (LOVENOX) injection  40 mg Subcutaneous Q24H  . feeding supplement  237 mL Oral TID BM  . furosemide  20 mg Intravenous Once  . insulin aspart  0-9 Units Subcutaneous Q6H  . latanoprost  1 drop Both Eyes QHS  . mouth rinse  15 mL Mouth Rinse q12n4p  . nicotine  14 mg Transdermal Daily  . pantoprazole (PROTONIX) IV  40 mg  Intravenous  Q12H  . umeclidinium bromide  1 puff Inhalation Daily   Continuous Infusions:   LOS: 10 days    Time spent: 35 minutes    Alberteen Samhristopher P Najae Rathert, MD [facility] 03/09/2020, 11:38 AM     Please page though AMION or Epic secure chat:  For [employer] Corporation[employer] password, Higher education careers advisercontact charge nurse

## 2020-03-10 DIAGNOSIS — M79673 Pain in unspecified foot: Secondary | ICD-10-CM | POA: Diagnosis not present

## 2020-03-10 DIAGNOSIS — I10 Essential (primary) hypertension: Secondary | ICD-10-CM | POA: Diagnosis not present

## 2020-03-10 DIAGNOSIS — N179 Acute kidney failure, unspecified: Secondary | ICD-10-CM | POA: Diagnosis not present

## 2020-03-10 DIAGNOSIS — K529 Noninfective gastroenteritis and colitis, unspecified: Secondary | ICD-10-CM | POA: Diagnosis not present

## 2020-03-10 DIAGNOSIS — Z7401 Bed confinement status: Secondary | ICD-10-CM | POA: Diagnosis not present

## 2020-03-10 DIAGNOSIS — Z0189 Encounter for other specified special examinations: Secondary | ICD-10-CM | POA: Diagnosis not present

## 2020-03-10 DIAGNOSIS — Z7901 Long term (current) use of anticoagulants: Secondary | ICD-10-CM | POA: Diagnosis not present

## 2020-03-10 DIAGNOSIS — H409 Unspecified glaucoma: Secondary | ICD-10-CM | POA: Diagnosis not present

## 2020-03-10 DIAGNOSIS — Z83511 Family history of glaucoma: Secondary | ICD-10-CM | POA: Diagnosis not present

## 2020-03-10 DIAGNOSIS — Z743 Need for continuous supervision: Secondary | ICD-10-CM | POA: Diagnosis not present

## 2020-03-10 DIAGNOSIS — F1027 Alcohol dependence with alcohol-induced persisting dementia: Secondary | ICD-10-CM | POA: Diagnosis present

## 2020-03-10 DIAGNOSIS — K56609 Unspecified intestinal obstruction, unspecified as to partial versus complete obstruction: Secondary | ICD-10-CM | POA: Diagnosis not present

## 2020-03-10 DIAGNOSIS — Z90710 Acquired absence of both cervix and uterus: Secondary | ICD-10-CM | POA: Diagnosis not present

## 2020-03-10 DIAGNOSIS — L89151 Pressure ulcer of sacral region, stage 1: Secondary | ICD-10-CM | POA: Diagnosis not present

## 2020-03-10 DIAGNOSIS — E876 Hypokalemia: Secondary | ICD-10-CM | POA: Diagnosis not present

## 2020-03-10 DIAGNOSIS — I4891 Unspecified atrial fibrillation: Secondary | ICD-10-CM | POA: Diagnosis not present

## 2020-03-10 DIAGNOSIS — F419 Anxiety disorder, unspecified: Secondary | ICD-10-CM | POA: Diagnosis present

## 2020-03-10 DIAGNOSIS — D72829 Elevated white blood cell count, unspecified: Secondary | ICD-10-CM | POA: Diagnosis not present

## 2020-03-10 DIAGNOSIS — K226 Gastro-esophageal laceration-hemorrhage syndrome: Secondary | ICD-10-CM | POA: Diagnosis not present

## 2020-03-10 DIAGNOSIS — R58 Hemorrhage, not elsewhere classified: Secondary | ICD-10-CM | POA: Diagnosis not present

## 2020-03-10 DIAGNOSIS — R911 Solitary pulmonary nodule: Secondary | ICD-10-CM | POA: Diagnosis not present

## 2020-03-10 DIAGNOSIS — Z6820 Body mass index (BMI) 20.0-20.9, adult: Secondary | ICD-10-CM | POA: Diagnosis not present

## 2020-03-10 DIAGNOSIS — J449 Chronic obstructive pulmonary disease, unspecified: Secondary | ICD-10-CM | POA: Diagnosis not present

## 2020-03-10 DIAGNOSIS — K219 Gastro-esophageal reflux disease without esophagitis: Secondary | ICD-10-CM | POA: Diagnosis not present

## 2020-03-10 DIAGNOSIS — R531 Weakness: Secondary | ICD-10-CM | POA: Diagnosis not present

## 2020-03-10 DIAGNOSIS — M1990 Unspecified osteoarthritis, unspecified site: Secondary | ICD-10-CM | POA: Diagnosis not present

## 2020-03-10 DIAGNOSIS — R109 Unspecified abdominal pain: Secondary | ICD-10-CM | POA: Diagnosis not present

## 2020-03-10 DIAGNOSIS — M79671 Pain in right foot: Secondary | ICD-10-CM | POA: Diagnosis not present

## 2020-03-10 DIAGNOSIS — F17210 Nicotine dependence, cigarettes, uncomplicated: Secondary | ICD-10-CM | POA: Diagnosis not present

## 2020-03-10 DIAGNOSIS — G9341 Metabolic encephalopathy: Secondary | ICD-10-CM | POA: Diagnosis not present

## 2020-03-10 DIAGNOSIS — J9811 Atelectasis: Secondary | ICD-10-CM | POA: Diagnosis not present

## 2020-03-10 DIAGNOSIS — Z20822 Contact with and (suspected) exposure to covid-19: Secondary | ICD-10-CM | POA: Diagnosis not present

## 2020-03-10 DIAGNOSIS — M6281 Muscle weakness (generalized): Secondary | ICD-10-CM | POA: Diagnosis not present

## 2020-03-10 DIAGNOSIS — Z7951 Long term (current) use of inhaled steroids: Secondary | ICD-10-CM | POA: Diagnosis not present

## 2020-03-10 DIAGNOSIS — K760 Fatty (change of) liver, not elsewhere classified: Secondary | ICD-10-CM | POA: Diagnosis not present

## 2020-03-10 DIAGNOSIS — Z4659 Encounter for fitting and adjustment of other gastrointestinal appliance and device: Secondary | ICD-10-CM | POA: Diagnosis not present

## 2020-03-10 DIAGNOSIS — E43 Unspecified severe protein-calorie malnutrition: Secondary | ICD-10-CM | POA: Diagnosis not present

## 2020-03-10 DIAGNOSIS — R1111 Vomiting without nausea: Secondary | ICD-10-CM | POA: Diagnosis not present

## 2020-03-10 DIAGNOSIS — E785 Hyperlipidemia, unspecified: Secondary | ICD-10-CM | POA: Diagnosis not present

## 2020-03-10 LAB — BASIC METABOLIC PANEL
Anion gap: 10 (ref 5–15)
BUN: 12 mg/dL (ref 8–23)
CO2: 21 mmol/L — ABNORMAL LOW (ref 22–32)
Calcium: 8.5 mg/dL — ABNORMAL LOW (ref 8.9–10.3)
Chloride: 102 mmol/L (ref 98–111)
Creatinine, Ser: 0.52 mg/dL (ref 0.44–1.00)
GFR, Estimated: >60 mL/min (ref >60)
Glucose, Bld: 98 mg/dL (ref 70–99)
Potassium: 2.8 mmol/L — ABNORMAL LOW (ref 3.5–5.1)
Sodium: 133 mmol/L — ABNORMAL LOW (ref 135–145)

## 2020-03-10 LAB — CBC
HCT: 33.3 % — ABNORMAL LOW (ref 36.0–46.0)
Hemoglobin: 11.2 g/dL — ABNORMAL LOW (ref 12.0–15.0)
MCH: 33.4 pg (ref 26.0–34.0)
MCHC: 33.6 g/dL (ref 30.0–36.0)
MCV: 99.4 fL (ref 80.0–100.0)
Platelets: 517 10*3/uL — ABNORMAL HIGH (ref 150–400)
RBC: 3.35 MIL/uL — ABNORMAL LOW (ref 3.87–5.11)
RDW: 13.1 % (ref 11.5–15.5)
WBC: 9.8 10*3/uL (ref 4.0–10.5)
nRBC: 0 % (ref 0.0–0.2)

## 2020-03-10 LAB — MAGNESIUM: Magnesium: 1.8 mg/dL (ref 1.7–2.4)

## 2020-03-10 LAB — POTASSIUM: Potassium: 2.9 mmol/L — ABNORMAL LOW (ref 3.5–5.1)

## 2020-03-10 MED ORDER — APIXABAN 5 MG PO TABS: 5.0000 mg | ORAL_TABLET | Freq: Two times a day (BID) | ORAL | Status: DC

## 2020-03-10 MED ORDER — MAGNESIUM SULFATE 2 GM/50ML IV SOLN
2.0000 g | Freq: Once | INTRAVENOUS | Status: AC
  Administered 2020-03-10: 2 g via INTRAVENOUS
  Filled 2020-03-10: qty 50

## 2020-03-10 MED ORDER — POTASSIUM CHLORIDE ER 10 MEQ PO TBCR: 10.0000 meq | EXTENDED_RELEASE_TABLET | Freq: Two times a day (BID) | ORAL | 0 refills | Status: DC

## 2020-03-10 MED ORDER — DILTIAZEM HCL ER COATED BEADS 180 MG PO CP24
300.0000 mg | ORAL_CAPSULE | Freq: Every day | ORAL | Status: DC
  Administered 2020-03-10: 300 mg via ORAL
  Filled 2020-03-10: qty 1

## 2020-03-10 MED ORDER — POTASSIUM CHLORIDE ER 20 MEQ PO TBCR: 20.0000 meq | EXTENDED_RELEASE_TABLET | Freq: Two times a day (BID) | ORAL | 0 refills | Status: DC

## 2020-03-10 MED ORDER — POTASSIUM CHLORIDE 10 MEQ/100ML IV SOLN
10.0000 meq | INTRAVENOUS | Status: AC
Start: 2020-03-10 — End: 2020-03-10
  Administered 2020-03-10 (×3): 10 meq via INTRAVENOUS
  Filled 2020-03-10 (×3): qty 100

## 2020-03-10 MED ORDER — LOPERAMIDE HCL 2 MG PO TABS: 2.0000 mg | ORAL_TABLET | Freq: Four times a day (QID) | ORAL | 0 refills | Status: DC | PRN

## 2020-03-10 MED ORDER — DILTIAZEM HCL ER COATED BEADS 300 MG PO CP24: 300.0000 mg | ORAL_CAPSULE | Freq: Every day | ORAL | 11 refills | Status: DC

## 2020-03-10 MED ORDER — AMOXICILLIN-POT CLAVULANATE 875-125 MG PO TABS: 1.0000 | ORAL_TABLET | Freq: Two times a day (BID) | ORAL | 0 refills | Status: DC

## 2020-03-10 MED ORDER — HYDROCODONE-ACETAMINOPHEN 5-325 MG PO TABS: 1.0000 | ORAL_TABLET | Freq: Two times a day (BID) | ORAL | 0 refills | Status: DC | PRN

## 2020-03-10 MED ORDER — ENSURE ENLIVE PO LIQD: 237.0000 mL | Freq: Three times a day (TID) | ORAL | 12 refills | Status: AC

## 2020-03-10 MED ORDER — POTASSIUM CHLORIDE CRYS ER 20 MEQ PO TBCR
40.0000 meq | EXTENDED_RELEASE_TABLET | Freq: Once | ORAL | Status: AC
  Administered 2020-03-10: 40 meq via ORAL
  Filled 2020-03-10: qty 2

## 2020-03-10 NOTE — Care Management Important Message (Signed)
Important Message  Patient Details  Name: Samantha Huerta MRN: [id number] Date of Birth: 24-Mar-1945   Medicare Important Message Given:  Yes     Corey Harold 03/10/2020, 11:46 AM

## 2020-03-10 NOTE — Progress Notes (Signed)
Pt's IV pump beeping. Upon inspection, IV site # 20g left upper arm no longer viable, IV angiocath lying on top of skin under tape, no bleeding noted. IV pump stopped, tape and angiocath removed and dressing placed. Attempted to restart IVF in site in right upper arm (#22 g) but this site is occluded and infiltrating with flush. This site was removed as well. Assigned nurse Charlsie Quest, RN notified, advises that pt is being discharged today.

## 2020-03-10 NOTE — Progress Notes (Signed)
MD notified of current BP 95/58 MAP 64 HR 90

## 2020-03-10 NOTE — TOC Transition Note (Addendum)
Transition of Care [facility]) - CM/SW Discharge Note   Patient Details  Name: Samantha Huerta MRN: [id number] Date of Birth: December 18, 1945  [facility]) CM/SW Contact:  Leitha Bleak, RN Phone Number: 03/10/2020, 11:27 AM   Clinical Narrative:   Patient discharging to [place] today. Debbie provided number for report, Clinicals sent in the hub. Medical necessity printed, RN to call report, TOC will call EMS when RN is ready. TOC updated [name] - Daughter. [name] is on her way with belongings and will pick up medications MD called in.   Called EMS, They will add to their list, RN updated  Final next level of care: Skilled Nursing Facility Barriers to Discharge: Barriers Resolved  Patient Goals and CMS Choice Patient states their goals for this hospitalization and ongoing recovery are:: to go to SNF CMS Medicare.gov Compare Post Acute Care list provided to:: Patient Choice offered to / list presented to : Patient  Discharge Placement              Patient chooses bed at: Other - please specify in the comment section below: ([facility]) Patient to be transferred to facility by: EMS Name of family member notified: [name] - daughter Patient and family notified of of transfer: 03/10/20  Discharge Plan and Services In-house Referral: Clinical Social Work              DME Arranged: N/A    HH Agency: NA   Readmission Risk Interventions Readmission Risk Prevention Plan 03/08/2020 02/29/2020  Transportation Screening - Complete  HRI or Home Care Consult - Complete  Social Work Consult for Recovery Care Planning/Counseling - Complete  Palliative Care Screening Complete Not Applicable  Medication Review Oceanographer) - Complete  Some recent data might be hidden

## 2020-03-10 NOTE — Clinical Social Work Note (Signed)
CSW received message from RN stating that pts daughter [name] was at bedside upset about pt being d/c to [facility] for SNF. [name] stating that she did not want pt going to [facility] due to poor reviews and that she was not consulted about SNF decision. CSW reviewed TOC transition note that states [place] and pt were updated on bed offers and agreeable to accepting bed offer and updated today 12/31 that pt would be d/c to [facility]. CSW reached out to [facility] Leitha Bleak, RN following case to consult. Lurena Joiner called [name] and had group conference call with [employer]. Lurena Joiner informed [name] that pt and [name] have been updated through process and agreeable to bed offer. [name] states she was unaware of the low rating of [facility]. Lurena Joiner informed [name] that the ratings come from a variety of factors and do not always accurately depict the care pts receive. [name] was informed that even five star facilities get bad reviews. Lurena Joiner also informed [name] that pts referral was sent out to multiple facilities in the area and only received one bed offer which was [facility]. Lurena Joiner and I informed [name] that there are many factors as to why pt did not receive bed offers from the other facilities including pts alcoholism, TPN, and other factors such as bed avaliability. We informed [name] that facilities are able to look over a pts chart and make their final decision. Lurena Joiner informed [name] that she could visit [facility] at [facility] and if she was not happy with care she could speak with SW at facility and attempt to see if there may be a facility out of area willing to accept pt. [name] agreed to allow CSW to call RN to continue with EMS to [place]. CSW updated RN, RN to call for EMS. TOC signing off.

## 2020-03-10 NOTE — Discharge Summary (Addendum)
Physician Discharge Summary  Samantha Huerta RWE:[id number] DOB: September 18, 1945 DOA: 02/28/2020  PCP: Samantha Balloon, FNP  Admit date: 02/28/2020 Discharge date: 03/10/2020  Admitted From: Home  Disposition:  S[facility]   Recommendations for Outpatient Follow-up:  1. Follow up with PCP Samantha Huerta in 1-2 weeks after discharge from SNF 2. Follow up with Cardiology Dr. Domenic Huerta for new Afib in 4-6 weeks 3. Please obtain BMP to follow K in 3 days on Monday Jan 3 4. Obtain CBC in one month to check Hgb on new blood thinner 5. Finish treatment of aspiration pneumonia with 3 more days Augmentin 6. Ensure patient uses flutter valve and incentive spirometer at least 6 times daily      Home Health: N/A  Equipment/Devices: TBD at SNF  Discharge Condition: Poor  CODE STATUS: FULL Diet recommendation: Cardiac  Brief/Interim Summary: Samantha Huerta is a 74 y.o. F with COPD not on home O2, HTN, alcohol use disorder who presented with several days of progressive nausea, vomiting, and abdominal pain.  In the ER, CT showed high-grade small bowel obstruction on CT. Also found to be hypotensive, with acute kidney injury.  Further hospitalization complicated by hypovolemic shock, alcohol withdrawal and severe delirium.     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  Also, there was report of significant vodka use daily. Last drink 12/18.  Here, patient developed severe delirium in the setting of her AKI and hypotension and bowel obstruction.  This required escalating benzodiazepines and then Precedex infusion. Finally able to be weaned off Precedex, and delirium resolved.   The patient has some residual cognitive impairment, which is likely resolving encephalopathy, probably  superimposed on some previously undiagnosed alcoholic dementia.    Small bowel obstruction Admitted and NG tube placed.  General Surgery consulted.  Conservative management recommended and resolved.  Now tolerating oral diet well.   Hypovolemic shock Patient developed hypotension refractory to IV fluids shortly after admission requiring Levophed. Cultures negative. Empiric antibiotics discontinued after 48 hours. Resolved.  Sepsis, due to aspiration pneumonia Later developed leukocytosis, tachycardia, found to have aspiration pneumonia.  Started on Unasyn.  Continue Augmentin, to complete 3 more days  Aggressive IS and flutter and mobilization          Discharge Instructions  Discharge Instructions    Diet - low sodium heart healthy   Complete by: As directed    Discharge instructions   Complete by: As directed    From Dr. Loleta Huerta: You were admitted for a severe bowel obstruction This resolved, but in the meantime, you developed bad delirium Delirium is a form of confusion caused by an underlying illness (in this case, bowel obstruction) It generally gets better by itself.  You were also found to have a new abnormal heart rhythm called Afib. For the Afib:  Take apixaban/Eliquis 5 mg twice daily Take diltiazem 300 mg once daily Go see Dr. Domenic Huerta, your heart doctor As we talked about, if you see bleeding in your bowel movement, or more importantly, black and tarry bowel movements, this may be a sign of bleeding and you must call your doctor IMMEDIATELY   For now, stop your blood pressure medicines These can be restarted if needed later   Increase activity slowly   Complete by: As directed      Allergies as of 03/10/2020   No  Known Allergies     Medication List    STOP taking these medications   amLODipine 5 MG tablet Commonly known as: NORVASC   aspirin EC 81 MG tablet   lisinopril 40 MG tablet Commonly known as: ZESTRIL   omeprazole 20 MG  capsule Commonly known as: PRILOSEC   oxyCODONE-acetaminophen 5-325 MG tablet Commonly known as: PERCOCET/ROXICET     TAKE these medications   acetaminophen 500 MG tablet Commonly known as: TYLENOL Take 500 mg by mouth every 6 (six) hours as needed for moderate pain or headache.   albuterol 108 (90 Base) MCG/ACT inhaler Commonly known as: VENTOLIN HFA Inhale 2 puffs into the lungs every 4 (four) hours as needed for wheezing or shortness of breath.   apixaban 5 MG Tabs tablet Commonly known as: ELIQUIS Take 1 tablet (5 mg total) by mouth 2 (two) times daily.   brinzolamide 1 % ophthalmic suspension Commonly known as: AZOPT Place 1 drop into both eyes 2 (two) times daily.   diltiazem 300 MG 24 hr capsule Commonly known as: Cardizem CD Take 1 capsule (300 mg total) by mouth daily.   feeding supplement Liqd Take 237 mLs by mouth 3 (three) times daily between meals.   Fluticasone-Salmeterol 250-50 MCG/DOSE Aepb Commonly known as: ADVAIR Inhale 1 puff into the lungs in the morning and at bedtime.   HYDROcodone-acetaminophen 5-325 MG tablet Commonly known as: Norco Take 1 tablet by mouth 2 (two) times daily as needed for moderate pain.   latanoprost 0.005 % ophthalmic solution Commonly known as: XALATAN Place 1 drop into both eyes at bedtime.   multivitamin with minerals tablet Take 1 tablet by mouth daily.   naloxone 4 MG/0.1ML Liqd nasal spray kit Commonly known as: NARCAN Use as needed for respiratory depression or narcotic overdose   ondansetron 4 MG tablet Commonly known as: Zofran Take 1 tablet (4 mg total) by mouth every 8 (eight) hours as needed for nausea or vomiting.   potassium chloride 10 MEQ tablet Commonly known as: KLOR-CON Take 1 tablet (10 mEq total) by mouth 2 (two) times daily for 7 days.   pravastatin 40 MG tablet Commonly known as: PRAVACHOL TAKE ONE (1) TABLET EACH DAY What changed: See the new instructions.   Vitamin D3 100000 UNIT/GM  Powd Take by mouth.       Contact information for follow-up providers    Samantha Spencer, FNP. Schedule an appointment as soon as possible for a visit in 1 week(Huerta).   Specialty: Family Medicine Contact information: [address] [place] [address] [phone]        Samantha Sidle, MD. Schedule an appointment as soon as possible for a visit in 1 month(Huerta).   Specialty: Cardiology Contact information: [address] [place] [address] [phone]            Contact information for after-discharge care    Destination    [facility] .   Service: Skilled Nursing Contact information: [address] [place] [address] [phone]                 No Known Allergies     Procedures/Studies: CT ABDOMEN PELVIS WO CONTRAST  Result Date: 02/28/2020 CLINICAL DATA:  Nausea and vomiting for several days EXAM: CT ABDOMEN AND PELVIS WITHOUT CONTRAST TECHNIQUE: Multidetector CT imaging of the abdomen and pelvis was performed following the standard protocol without IV contrast. COMPARISON:  None. FINDINGS: Lower chest: No acute  abnormality. Hepatobiliary: No focal liver abnormality is seen. No gallstones, gallbladder wall thickening, or biliary dilatation. Pancreas: Unremarkable. No pancreatic ductal dilatation or surrounding inflammatory changes. Spleen: Normal in size without focal abnormality. Adrenals/Urinary Tract: Adrenal glands are within normal limits. Kidneys are well visualized bilaterally. No renal calculi or obstructive changes are seen. Bladder is decompressed. Stomach/Bowel: Colon shows diverticular change without evidence of obstructive or inflammatory change. Colon is predominately decompressed although dense material is noted within the colon which may be related to ingested material. The appendix is not visualized and surgical clips are noted likely related to prior removal. The stomach is  significantly distended with fluid as is the small bowel throughout the jejunum in into the proximal ileum. There is a transition zone identified in the mid abdomen best seen on image number 44 of series 2 and image number 41 of series 5. This is likely related to adhesions as no definitive mass is seen. The more distal small bowel is unremarkable. Vascular/Lymphatic: Aortic atherosclerosis. No enlarged abdominal or pelvic lymph nodes. Reproductive: Status post hysterectomy. No adnexal masses. Other: No abdominal wall hernia or abnormality. No abdominopelvic ascites. Musculoskeletal: Degenerative changes of lumbar spine are noted. No acute compression deformity is noted. IMPRESSION: High-grade small bowel obstruction likely related to adhesions in the proximal ileum. More distal small bowel and colon are decompressed. No other focal abnormality is noted. Electronically Signed   By: Inez Catalina M.D.   On: 02/28/2020 11:10   DG Abd 1 View  Result Date: 03/05/2020 CLINICAL DATA:  Small-bowel obstruction EXAM: ABDOMEN - 1 VIEW COMPARISON:  03/03/2000 FINDINGS: Nasogastric tube in the decompressed stomach. Small bowel and colon appear decompressed. Surgical clips in the right pelvis. Multilevel lumbar spondylitic change. IMPRESSION: Nasogastric tube in the stomach. Electronically Signed   By: Lucrezia Europe M.D.   On: 03/05/2020 08:17   DG Abd 1 View  Result Date: 03/01/2020 CLINICAL DATA:  Small bowel obstruction EXAM: ABDOMEN - 1 VIEW COMPARISON:  CT 2 days ago FINDINGS: Ongoing small bowel obstruction with dilated loops in the central abdomen measuring up to 3.8 cm. The degree of distension is improved, but no new colonic gas is seen. No gross pneumoperitoneum. Clear lung bases. IMPRESSION: Ongoing small bowel obstruction with improved small bowel distension. Electronically Signed   By: Monte Fantasia M.D.   On: 03/01/2020 07:44   DG Chest Port 1 View  Result Date: 02/28/2020 CLINICAL DATA:  74 year old  female status post central line placement. EXAM: PORTABLE CHEST 1 VIEW COMPARISON:  Chest radiograph dated 04/05/2016 and CT dated 07/08/2019. FINDINGS: Right IJ central venous line with tip over central SVC. Enteric tube with tip in the distal stomach. There is background of emphysema. No focal consolidation, pleural effusion or pneumothorax. The cardiac silhouette is within limits. Atherosclerotic calcification of the aorta. Osteopenia with degenerative changes of the spine and shoulders. Old healed right hip fractures. No acute osseous pathology. IMPRESSION: Right IJ central venous line with tip over central SVC. Electronically Signed   By: Anner Crete M.D.   On: 02/28/2020 18:46   DG ABD ACUTE 2+V W 1V CHEST  Result Date: 03/05/2020 CLINICAL DATA:  Small-bowel obstruction, nausea, weakness EXAM: DG ABDOMEN ACUTE WITH 1 VIEW CHEST COMPARISON:  Earlier abdominal radiograph 0800 hours, chest radiograph 02/29/2020 FINDINGS: Nasogastric tube extends into stomach. RIGHT jugular line tip projecting over SVC. Normal heart size, mediastinal contours, and pulmonary vascularity. Atherosclerotic calcification aorta. Atelectasis versus consolidation LEFT lower lobe new since prior study. Small LEFT  pleural effusion. Underlying emphysematous changes. No pneumothorax. Nonobstructive bowel gas pattern. No bowel dilatation or wall thickening. No free air. Bones demineralized with BILATERAL chronic rotator cuff tears and degenerative disc disease changes of the thoracic/lumbar spine. IMPRESSION: Nonobstructive bowel gas pattern. Atelectasis versus consolidation LEFT lower lobe new since 02/28/2020 with small associated LEFT pleural effusion. Electronically Signed   By: Lavonia Dana M.D.   On: 03/05/2020 13:04   DG ABD ACUTE 2+V W 1V CHEST  Result Date: 03/03/2020 CLINICAL DATA:  Small-bowel obstruction, COPD, hypertension, smoker EXAM: DG ABDOMEN ACUTE WITH 1 VIEW CHEST COMPARISON:  03/02/2020 FINDINGS: Tip of RIGHT  jugular line projects over SVC. Nasogastric tube extends into stomach. Normal heart size, mediastinal contours, and pulmonary vascularity. Severe aorta Emphysematous and bronchitic changes consistent with COPD. Atelectasis versus consolidation LEFT lower lobe slightly increased. Remaining lungs free of acute infiltrate. No pleural effusion or pneumothorax. Single prominent small bowel loop in the mid abdomen. Questionable gastric wall thickening versus artifact from underdistention. No bowel wall thickening or free air. Multilevel degenerative disc disease changes lumbar spine. Degenerative changes RIGHT hip joint. Surgical clips in pelvis. IMPRESSION: COPD changes with atelectasis versus consolidation LEFT lower lobe. Single prominent small bowel loop in the mid abdomen with decreased small bowel distension since previous study. Questionable gastric wall thickening versus artifact from underdistention. Electronically Signed   By: Lavonia Dana M.D.   On: 03/03/2020 09:47   DG ABD ACUTE 2+V W 1V CHEST  Result Date: 03/02/2020 CLINICAL DATA:  Small-bowel obstruction. EXAM: DG ABDOMEN ACUTE WITH 1 VIEW CHEST COMPARISON:  March 01, 2020. FINDINGS: Mild left basilar opacities with partial silhouetting of the left hemidiaphragm. No visible pleural effusions or pneumothorax. Stable cardiomediastinal silhouette. Enteric tube courses below the diaphragm with the tip in the distal stomach/peripyloric region, unchanged. Right central venous catheter with the tip projecting in the region of the superior cavoatrial junction. Slight increase in small bowel dilation with small bowel measuring up to 4 cm in the left lower abdomen. No other interval change. No gross pneumoperitoneum on this supine radiograph. Surgical clips projecting over the lower abdomen/pelvis. Polyarticular degenerative change. IMPRESSION: 1. Slight increase in small bowel dilation. 2. Mild left basilar opacities, which may represent atelectasis,  aspiration, and/or pneumonia. Electronically Signed   By: Margaretha Sheffield MD   On: 03/02/2020 08:20   DG Abdomen Acute W/Chest  Result Date: 02/28/2020 CLINICAL DATA:  Abdominal pain, hypotension, vomiting EXAM: DG ABDOMEN ACUTE WITH 1 VIEW CHEST COMPARISON:  CT 07/08/2019 FINDINGS: Heart size and mediastinal contours are within normal limits. Aortic Atherosclerosis (ICD10-170.0). Lungs hyperinflated with chronic coarse attenuated bronchovascular markings. No focal infiltrate. No free air. Gas distended small bowel loops in the lower abdomen, left greater than right, only slightly increased since previous abdominal radiograph 03/20/2017. Surgical clips in the lower pelvis. The colon is decompressed. There are no abnormal calcifications. Multilevel lumbar spondylitic change. No fracture or worrisome bone lesion. IMPRESSION: 1. Small bowel distention suggesting  ileus or obstruction. 2. No free air. Electronically Signed   By: Lucrezia Europe M.D.   On: 02/28/2020 09:46   DG Abd Portable 1V-Small Bowel Obstruction Protocol-initial, 8 hr delay  Result Date: 03/01/2020 CLINICAL DATA:  74 year old female with small bowel obstruction. 8 hour delayed image. EXAM: PORTABLE ABDOMEN - 1 VIEW COMPARISON:  CT of the abdomen pelvis dated 02/28/2020 abdominal radiograph dated 03/01/2020 FINDINGS: Enteric tube with tip in the distal stomach. Improved dilatation of the small bowel compared to the prior radiograph now  measuring up to 3 cm in the left lower abdomen. No other interval change. IMPRESSION: Improved dilatation of the small bowel compared to prior radiograph. Continued follow-up recommended. Electronically Signed   By: Anner Crete M.D.   On: 03/01/2020 22:29   [place] EKG SITE RITE  Result Date: 02/28/2020 If Site Rite image not attached, placement could not be confirmed due to current cardiac rhythm.      Subjective: Patient still pleasntly confused.  No vomiting.  Still very weak.  No fever, no  respiratory distress, no chest pain, no dyspnea.  No palpitations.  Discharge Exam: Vitals:   03/10/20 0157 03/10/20 0600  BP: 109/64 127/81  Pulse: 89 89  Resp: 18 18  Temp: 97.8 F (36.6 C)   SpO2: 99%    Vitals:   03/09/20 2131 03/10/20 0038 03/10/20 0157 03/10/20 0600  BP: 107/61 (!) 95/58 109/64 127/81  Pulse: 84 90 89 89  Resp: $Remo'18 17 18 18  'NsmFh$ Temp: 97.7 F (36.5 C)  97.8 F (36.6 C)   TempSrc:      SpO2: 98% 97% 99%   Weight:      Height:        General: Pt is alert, awake, not in acute distress, lying in bed, pleasantly confused Cardiovascular: Regular rate, abnormal rhythm, nl S1-S2, no murmurs appreciated.   No LE edema.   Respiratory: Normal respiratory rate and rhythm.  CTAB without rales or wheezes. Abdominal: Abdomen soft and non-tender.  No distension or HSM.   Neuro/Psych: Strength symmetric in upper and lower extremities.  Judgment and insight appear moderately impaired, oriented to self, hospital, but tangential, inattentive.   The results of significant diagnostics from this hospitalization (including imaging, microbiology, ancillary and laboratory) are listed below for reference.     Microbiology: Recent Results (from the past 240 hour(Huerta))  Culture, blood (Routine X 2) w Reflex to ID Panel     Status: None   Collection Time: 03/01/20 11:33 AM   Specimen: BLOOD  Result Value Ref Range Status   Specimen Description BLOOD BLOOD RIGHT ARM  Final   Special Requests   Final    BOTTLES DRAWN AEROBIC AND ANAEROBIC Blood Culture adequate volume   Culture   Final    NO GROWTH 5 DAYS Performed at [facility], [address] [place], [place] [address]    Report Status 03/06/2020 FINAL  Final  Culture, blood (Routine X 2) w Reflex to ID Panel     Status: None   Collection Time: 03/01/20 11:33 AM   Specimen: BLOOD  Result Value Ref Range Status   Specimen Description BLOOD BLOOD LEFT ARM  Final   Special Requests   Final    BOTTLES DRAWN AEROBIC AND  ANAEROBIC Blood Culture results may not be optimal due to an inadequate volume of blood received in culture bottles   Culture   Final    NO GROWTH 5 DAYS Performed at [facility], [address], [place],  [address]    Report Status 03/06/2020 FINAL  Final  Resp Panel by RT-PCR (Flu A&B, Covid) Nasopharyngeal Swab     Status: None   Collection Time: 03/09/20 12:23 PM   Specimen: Nasopharyngeal Swab; Nasopharyngeal(NP) swabs in vial transport medium  Result Value Ref Range Status   SARS Coronavirus 2 by RT PCR NEGATIVE NEGATIVE Final    Comment: (NOTE) SARS-CoV-2 target nucleic acids are NOT DETECTED.  The SARS-CoV-2 RNA is generally detectable in upper respiratory specimens during the acute phase of  infection. The lowest concentration of SARS-CoV-2 viral copies this assay can detect is 138 copies/mL. A negative result does not preclude SARS-Cov-2 infection and should not be used as the sole basis for treatment or other patient management decisions. A negative result may occur with  improper specimen collection/handling, submission of specimen other than nasopharyngeal swab, presence of viral mutation(Huerta) within the areas targeted by this assay, and inadequate number of viral copies(<138 copies/mL). A negative result must be combined with clinical observations, patient history, and epidemiological information. The expected result is Negative.  Fact Sheet for Patients:  EntrepreneurPulse.com.au  Fact Sheet for Healthcare Providers:  IncredibleEmployment.be  This test is no t yet approved or cleared by the [place] FDA and  has been authorized for detection and/or diagnosis of SARS-CoV-2 by FDA under an Emergency Use Authorization (EUA). This EUA will remain  in effect (meaning this test can be used) for the duration of the COVID-19 declaration under Section 564(b)(1) of the Act, 21 U.[place].C.section 360bbb-3(b)(1), unless the authorization  is terminated  or revoked sooner.       Influenza A by PCR NEGATIVE NEGATIVE Final   Influenza B by PCR NEGATIVE NEGATIVE Final    Comment: (NOTE) The Xpert Xpress SARS-CoV-2/FLU/RSV plus assay is intended as an aid in the diagnosis of influenza from Nasopharyngeal swab specimens and should not be used as a sole basis for treatment. Nasal washings and aspirates are unacceptable for Xpert Xpress SARS-CoV-2/FLU/RSV testing.  Fact Sheet for Patients: EntrepreneurPulse.com.au  Fact Sheet for Healthcare Providers: IncredibleEmployment.be  This test is not yet approved or cleared by the [place] FDA and has been authorized for detection and/or diagnosis of SARS-CoV-2 by FDA under an Emergency Use Authorization (EUA). This EUA will remain in effect (meaning this test can be used) for the duration of the COVID-19 declaration under Section 564(b)(1) of the Act, 21 U.[place].C. section 360bbb-3(b)(1), unless the authorization is terminated or revoked.  Performed at [facility], [address], [place], [place] [address]      Labs: BNP (last 3 results) No results for input(Huerta): BNP in the last 8760 hours. Basic Metabolic Panel: Recent Labs  Lab 03/04/20 0426 03/05/20 0631 03/06/20 0728 03/07/20 1713 03/08/20 0539 03/09/20 0504 03/10/20 0514  NA 137 137 137 137 134* 134* 133*  K 3.1* 3.8 3.4* 3.7 4.1 3.8 2.8*  CL 98 106 107 108 105 104 102  CO2 26 22 19* 20* 19* 19* 21*  GLUCOSE 112* 176* 122* 145* 114* 87 98  BUN $Re'21 21 21 23 23 14 12  'qfr$ CREATININE 0.73 0.58 0.51 0.49 0.52 0.46 0.52  CALCIUM 8.3* 8.0* 8.3* 7.9* 8.3* 8.4* 8.5*  MG 1.5* 2.2 1.7 1.6*  --  1.4* 1.8  PHOS 2.9 2.6 3.4  --  3.3 3.1  --    Liver Function Tests: Recent Labs  Lab 03/04/20 0426 03/05/20 0631 03/06/20 0728 03/08/20 0539 03/09/20 0504  AST 30 49* 59*  --  22  ALT 29 42 67*  --  31  ALKPHOS 58 51 58  --  89  BILITOT 0.7 0.3 0.4  --  0.5  PROT 5.5* 5.2* 5.2*  --   5.3*  ALBUMIN 2.6* 2.4* 2.3* 2.2* 2.2*   No results for input(Huerta): LIPASE, AMYLASE in the last 168 hours. No results for input(Huerta): AMMONIA in the last 168 hours. CBC: Recent Labs  Lab 03/04/20 0426 03/06/20 0728 03/08/20 0539 03/09/20 0504 03/10/20 0514  WBC 8.7 13.4* 17.8* 14.3* 9.8  NEUTROABS 6.4  10.5*  --   --   --   HGB 13.0 13.0 11.9* 10.7* 11.2*  HCT 39.5 40.3 36.5 32.3* 33.3*  MCV 101.3* 104.7* 101.4* 100.3* 99.4  PLT 342 273 406* 425* 517*   Cardiac Enzymes: No results for input(Huerta): CKTOTAL, CKMB, CKMBINDEX, TROPONINI in the last 168 hours. BNP: Invalid input(Huerta): POCBNP CBG: Recent Labs  Lab 03/08/20 1612 03/09/20 0005 03/09/20 0454 03/09/20 1110 03/09/20 1608  GLUCAP 80 81 88 81 75   D-Dimer No results for input(Huerta): DDIMER in the last 72 hours. Hgb A1c No results for input(Huerta): HGBA1C in the last 72 hours. Lipid Profile No results for input(Huerta): CHOL, HDL, LDLCALC, TRIG, CHOLHDL, LDLDIRECT in the last 72 hours. Thyroid function studies No results for input(Huerta): TSH, T4TOTAL, T3FREE, THYROIDAB in the last 72 hours.  Invalid input(Huerta): FREET3 Anemia work up No results for input(Huerta): VITAMINB12, FOLATE, FERRITIN, TIBC, IRON, RETICCTPCT in the last 72 hours. Urinalysis    Component Value Date/Time   COLORURINE AMBER (A) 02/28/2020 2200   APPEARANCEUR HAZY (A) 02/28/2020 2200   APPEARANCEUR Clear 03/20/2017 1112   LABSPEC 1.021 02/28/2020 2200   PHURINE 5.0 02/28/2020 2200   GLUCOSEU NEGATIVE 02/28/2020 2200   HGBUR NEGATIVE 02/28/2020 2200   BILIRUBINUR SMALL (A) 02/28/2020 2200   BILIRUBINUR Negative 03/20/2017 1112   KETONESUR 5 (A) 02/28/2020 2200   PROTEINUR NEGATIVE 02/28/2020 2200   UROBILINOGEN negative 01/25/2014 1529   NITRITE NEGATIVE 02/28/2020 2200   LEUKOCYTESUR NEGATIVE 02/28/2020 2200   Sepsis Labs Invalid input(Huerta): PROCALCITONIN,  WBC,  LACTICIDVEN Microbiology Recent Results (from the past 240 hour(Huerta))  Culture, blood (Routine X 2)  w Reflex to ID Panel     Status: None   Collection Time: 03/01/20 11:33 AM   Specimen: BLOOD  Result Value Ref Range Status   Specimen Description BLOOD BLOOD RIGHT ARM  Final   Special Requests   Final    BOTTLES DRAWN AEROBIC AND ANAEROBIC Blood Culture adequate volume   Culture   Final    NO GROWTH 5 DAYS Performed at [facility], [address] [place], [place] [address]    Report Status 03/06/2020 FINAL  Final  Culture, blood (Routine X 2) w Reflex to ID Panel     Status: None   Collection Time: 03/01/20 11:33 AM   Specimen: BLOOD  Result Value Ref Range Status   Specimen Description BLOOD BLOOD LEFT ARM  Final   Special Requests   Final    BOTTLES DRAWN AEROBIC AND ANAEROBIC Blood Culture results may not be optimal due to an inadequate volume of blood received in culture bottles   Culture   Final    NO GROWTH 5 DAYS Performed at [facility] [facility], [address], [place], [place] [address]    Report Status 03/06/2020 FINAL  Final  Resp Panel by RT-PCR (Flu A&B, Covid) Nasopharyngeal Swab     Status: None   Collection Time: 03/09/20 12:23 PM   Specimen: Nasopharyngeal Swab; Nasopharyngeal(NP) swabs in vial transport medium  Result Value Ref Range Status   SARS Coronavirus 2 by RT PCR NEGATIVE NEGATIVE Final    Comment: (NOTE) SARS-CoV-2 target nucleic acids are NOT DETECTED.  The SARS-CoV-2 RNA is generally detectable in upper respiratory specimens during the acute phase of infection. The lowest concentration of SARS-CoV-2 viral copies this assay can detect is 138 copies/mL. A negative result does not preclude SARS-Cov-2 infection and should not be used as the sole basis for treatment or other patient management decisions.  A negative result may occur with  improper specimen collection/handling, submission of specimen other than nasopharyngeal swab, presence of viral mutation(Huerta) within the areas targeted by this assay, and inadequate number of viral copies(<138 copies/mL). A  negative result must be combined with clinical observations, patient history, and epidemiological information. The expected result is Negative.  Fact Sheet for Patients:  EntrepreneurPulse.com.au  Fact Sheet for Healthcare Providers:  IncredibleEmployment.be  This test is no t yet approved or cleared by the [place] FDA and  has been authorized for detection and/or diagnosis of SARS-CoV-2 by FDA under an Emergency Use Authorization (EUA). This EUA will remain  in effect (meaning this test can be used) for the duration of the COVID-19 declaration under Section 564(b)(1) of the Act, 21 U.[place].C.section 360bbb-3(b)(1), unless the authorization is terminated  or revoked sooner.       Influenza A by PCR NEGATIVE NEGATIVE Final   Influenza B by PCR NEGATIVE NEGATIVE Final    Comment: (NOTE) The Xpert Xpress SARS-CoV-2/FLU/RSV plus assay is intended as an aid in the diagnosis of influenza from Nasopharyngeal swab specimens and should not be used as a sole basis for treatment. Nasal washings and aspirates are unacceptable for Xpert Xpress SARS-CoV-2/FLU/RSV testing.  Fact Sheet for Patients: EntrepreneurPulse.com.au  Fact Sheet for Healthcare Providers: IncredibleEmployment.be  This test is not yet approved or cleared by the [place] FDA and has been authorized for detection and/or diagnosis of SARS-CoV-2 by FDA under an Emergency Use Authorization (EUA). This EUA will remain in effect (meaning this test can be used) for the duration of the COVID-19 declaration under Section 564(b)(1) of the Act, 21 U.[place].C. section 360bbb-3(b)(1), unless the authorization is terminated or revoked.  Performed at [facility], [address], [place], [place] [address]      Time coordinating discharge: 45 minutes The [place] controlled substances registry was reviewed for this patient prior to filling the <5 days supply  controlled substances script.      SIGNED:   Edwin Dada, MD  [facility] 03/10/2020, 9:16 AM

## 2020-03-10 NOTE — Progress Notes (Signed)
[facility] here to transport patient to [facility]. Daughter at beside agrees with discharge to facility. [place] RN provided report to facility. Documents given to EMS. Patient left via stretcher.

## 2020-03-13 ENCOUNTER — Encounter (HOSPITAL_COMMUNITY): Payer: Self-pay | Admitting: Emergency Medicine

## 2020-03-13 ENCOUNTER — Encounter (HOSPITAL_COMMUNITY): Payer: Self-pay | Admitting: Anesthesiology

## 2020-03-13 ENCOUNTER — Emergency Department (HOSPITAL_COMMUNITY): Payer: Medicare Other

## 2020-03-13 ENCOUNTER — Ambulatory Visit: Payer: Medicare Other | Admitting: Family

## 2020-03-13 ENCOUNTER — Inpatient Hospital Stay (HOSPITAL_COMMUNITY): Payer: Medicare Other

## 2020-03-13 ENCOUNTER — Other Ambulatory Visit: Payer: Self-pay

## 2020-03-13 ENCOUNTER — Inpatient Hospital Stay (HOSPITAL_COMMUNITY)
Admission: EM | Admit: 2020-03-13 | Discharge: 2020-03-17 | DRG: 388 | Disposition: A | Discharge status: Skilled Nursing Facility | Payer: Medicare Other | Source: Skilled Nursing Facility | Attending: Family Medicine | Admitting: Family Medicine

## 2020-03-13 DIAGNOSIS — R911 Solitary pulmonary nodule: Secondary | ICD-10-CM | POA: Diagnosis present

## 2020-03-13 DIAGNOSIS — L89151 Pressure ulcer of sacral region, stage 1: Secondary | ICD-10-CM | POA: Diagnosis not present

## 2020-03-13 DIAGNOSIS — F1027 Alcohol dependence with alcohol-induced persisting dementia: Secondary | ICD-10-CM | POA: Diagnosis present

## 2020-03-13 DIAGNOSIS — K226 Gastro-esophageal laceration-hemorrhage syndrome: Secondary | ICD-10-CM | POA: Diagnosis present

## 2020-03-13 DIAGNOSIS — E876 Hypokalemia: Secondary | ICD-10-CM | POA: Diagnosis not present

## 2020-03-13 DIAGNOSIS — R109 Unspecified abdominal pain: Secondary | ICD-10-CM | POA: Diagnosis not present

## 2020-03-13 DIAGNOSIS — F419 Anxiety disorder, unspecified: Secondary | ICD-10-CM | POA: Diagnosis present

## 2020-03-13 DIAGNOSIS — K219 Gastro-esophageal reflux disease without esophagitis: Secondary | ICD-10-CM | POA: Diagnosis present

## 2020-03-13 DIAGNOSIS — I4891 Unspecified atrial fibrillation: Secondary | ICD-10-CM | POA: Diagnosis not present

## 2020-03-13 DIAGNOSIS — R1111 Vomiting without nausea: Secondary | ICD-10-CM | POA: Diagnosis not present

## 2020-03-13 DIAGNOSIS — Z743 Need for continuous supervision: Secondary | ICD-10-CM | POA: Diagnosis not present

## 2020-03-13 DIAGNOSIS — Z90710 Acquired absence of both cervix and uterus: Secondary | ICD-10-CM

## 2020-03-13 DIAGNOSIS — I10 Essential (primary) hypertension: Secondary | ICD-10-CM | POA: Diagnosis present

## 2020-03-13 DIAGNOSIS — E785 Hyperlipidemia, unspecified: Secondary | ICD-10-CM | POA: Diagnosis not present

## 2020-03-13 DIAGNOSIS — H409 Unspecified glaucoma: Secondary | ICD-10-CM | POA: Diagnosis present

## 2020-03-13 DIAGNOSIS — Z4659 Encounter for fitting and adjustment of other gastrointestinal appliance and device: Secondary | ICD-10-CM | POA: Diagnosis not present

## 2020-03-13 DIAGNOSIS — Z83511 Family history of glaucoma: Secondary | ICD-10-CM | POA: Diagnosis not present

## 2020-03-13 DIAGNOSIS — J449 Chronic obstructive pulmonary disease, unspecified: Secondary | ICD-10-CM | POA: Diagnosis present

## 2020-03-13 DIAGNOSIS — Z6820 Body mass index (BMI) 20.0-20.9, adult: Secondary | ICD-10-CM | POA: Diagnosis not present

## 2020-03-13 DIAGNOSIS — K6389 Other specified diseases of intestine: Secondary | ICD-10-CM | POA: Diagnosis not present

## 2020-03-13 DIAGNOSIS — Z7901 Long term (current) use of anticoagulants: Secondary | ICD-10-CM

## 2020-03-13 DIAGNOSIS — J9811 Atelectasis: Secondary | ICD-10-CM | POA: Diagnosis not present

## 2020-03-13 DIAGNOSIS — Z7401 Bed confinement status: Secondary | ICD-10-CM | POA: Diagnosis not present

## 2020-03-13 DIAGNOSIS — U071 COVID-19: Secondary | ICD-10-CM | POA: Diagnosis not present

## 2020-03-13 DIAGNOSIS — Z20822 Contact with and (suspected) exposure to covid-19: Secondary | ICD-10-CM | POA: Diagnosis not present

## 2020-03-13 DIAGNOSIS — R58 Hemorrhage, not elsewhere classified: Secondary | ICD-10-CM | POA: Diagnosis not present

## 2020-03-13 DIAGNOSIS — M1990 Unspecified osteoarthritis, unspecified site: Secondary | ICD-10-CM | POA: Diagnosis not present

## 2020-03-13 DIAGNOSIS — M79671 Pain in right foot: Secondary | ICD-10-CM | POA: Diagnosis not present

## 2020-03-13 DIAGNOSIS — F17210 Nicotine dependence, cigarettes, uncomplicated: Secondary | ICD-10-CM | POA: Diagnosis present

## 2020-03-13 DIAGNOSIS — Z7951 Long term (current) use of inhaled steroids: Secondary | ICD-10-CM | POA: Diagnosis not present

## 2020-03-13 DIAGNOSIS — L8990 Pressure ulcer of unspecified site, unspecified stage: Secondary | ICD-10-CM | POA: Insufficient documentation

## 2020-03-13 DIAGNOSIS — M79673 Pain in unspecified foot: Secondary | ICD-10-CM

## 2020-03-13 DIAGNOSIS — D72829 Elevated white blood cell count, unspecified: Secondary | ICD-10-CM | POA: Diagnosis present

## 2020-03-13 DIAGNOSIS — S91301A Unspecified open wound, right foot, initial encounter: Secondary | ICD-10-CM | POA: Diagnosis not present

## 2020-03-13 DIAGNOSIS — M160 Bilateral primary osteoarthritis of hip: Secondary | ICD-10-CM | POA: Diagnosis not present

## 2020-03-13 DIAGNOSIS — M47816 Spondylosis without myelopathy or radiculopathy, lumbar region: Secondary | ICD-10-CM | POA: Diagnosis not present

## 2020-03-13 DIAGNOSIS — K529 Noninfective gastroenteritis and colitis, unspecified: Secondary | ICD-10-CM | POA: Diagnosis not present

## 2020-03-13 DIAGNOSIS — K56609 Unspecified intestinal obstruction, unspecified as to partial versus complete obstruction: Principal | ICD-10-CM | POA: Diagnosis present

## 2020-03-13 DIAGNOSIS — I482 Chronic atrial fibrillation, unspecified: Secondary | ICD-10-CM | POA: Diagnosis not present

## 2020-03-13 DIAGNOSIS — E43 Unspecified severe protein-calorie malnutrition: Secondary | ICD-10-CM | POA: Diagnosis not present

## 2020-03-13 DIAGNOSIS — Z4682 Encounter for fitting and adjustment of non-vascular catheter: Secondary | ICD-10-CM | POA: Diagnosis not present

## 2020-03-13 DIAGNOSIS — S91001A Unspecified open wound, right ankle, initial encounter: Secondary | ICD-10-CM | POA: Diagnosis not present

## 2020-03-13 DIAGNOSIS — K920 Hematemesis: Secondary | ICD-10-CM | POA: Diagnosis not present

## 2020-03-13 DIAGNOSIS — R262 Difficulty in walking, not elsewhere classified: Secondary | ICD-10-CM | POA: Diagnosis not present

## 2020-03-13 DIAGNOSIS — M6281 Muscle weakness (generalized): Secondary | ICD-10-CM | POA: Diagnosis not present

## 2020-03-13 DIAGNOSIS — R531 Weakness: Secondary | ICD-10-CM | POA: Diagnosis not present

## 2020-03-13 DIAGNOSIS — R5381 Other malaise: Secondary | ICD-10-CM | POA: Diagnosis not present

## 2020-03-13 DIAGNOSIS — Z0189 Encounter for other specified special examinations: Secondary | ICD-10-CM | POA: Diagnosis not present

## 2020-03-13 DIAGNOSIS — Z79899 Other long term (current) drug therapy: Secondary | ICD-10-CM

## 2020-03-13 DIAGNOSIS — K760 Fatty (change of) liver, not elsewhere classified: Secondary | ICD-10-CM | POA: Diagnosis not present

## 2020-03-13 LAB — CBC
HCT: 36.6 % (ref 36.0–46.0)
Hemoglobin: 11.6 g/dL — ABNORMAL LOW (ref 12.0–15.0)
MCH: 33 pg (ref 26.0–34.0)
MCHC: 31.7 g/dL (ref 30.0–36.0)
MCV: 104 fL — ABNORMAL HIGH (ref 80.0–100.0)
Platelets: 563 10*3/uL — ABNORMAL HIGH (ref 150–400)
RBC: 3.52 MIL/uL — ABNORMAL LOW (ref 3.87–5.11)
RDW: 13 % (ref 11.5–15.5)
WBC: 11.6 10*3/uL — ABNORMAL HIGH (ref 4.0–10.5)
nRBC: 0 % (ref 0.0–0.2)

## 2020-03-13 LAB — LIPASE, BLOOD: Lipase: 34 U/L (ref 11–51)

## 2020-03-13 LAB — URINALYSIS, ROUTINE W REFLEX MICROSCOPIC
Bilirubin Urine: NEGATIVE
Glucose, UA: NEGATIVE mg/dL
Hgb urine dipstick: NEGATIVE
Ketones, ur: 20 mg/dL — AB
Leukocytes,Ua: NEGATIVE
Nitrite: NEGATIVE
Protein, ur: NEGATIVE mg/dL
Specific Gravity, Urine: >1.046 — ABNORMAL HIGH (ref 1.005–1.030)
pH: 6 (ref 5.0–8.0)

## 2020-03-13 LAB — DIFFERENTIAL
Abs Immature Granulocytes: 0.05 10*3/uL (ref 0.00–0.07)
Basophils Absolute: 0 10*3/uL (ref 0.0–0.1)
Basophils Relative: 0 %
Eosinophils Absolute: 0 10*3/uL (ref 0.0–0.5)
Eosinophils Relative: 0 %
Immature Granulocytes: 0 %
Lymphocytes Relative: 4 %
Lymphs Abs: 0.5 10*3/uL — ABNORMAL LOW (ref 0.7–4.0)
Monocytes Absolute: 1 10*3/uL (ref 0.1–1.0)
Monocytes Relative: 9 %
Neutro Abs: 9.7 10*3/uL — ABNORMAL HIGH (ref 1.7–7.7)
Neutrophils Relative %: 87 %

## 2020-03-13 LAB — COMPREHENSIVE METABOLIC PANEL
ALT: 27 U/L (ref 0–44)
AST: 24 U/L (ref 15–41)
Albumin: 3 g/dL — ABNORMAL LOW (ref 3.5–5.0)
Alkaline Phosphatase: 93 U/L (ref 38–126)
Anion gap: 15 (ref 5–15)
BUN: 10 mg/dL (ref 8–23)
CO2: 23 mmol/L (ref 22–32)
Calcium: 8.9 mg/dL (ref 8.9–10.3)
Chloride: 100 mmol/L (ref 98–111)
Creatinine, Ser: 0.63 mg/dL (ref 0.44–1.00)
GFR, Estimated: >60 mL/min (ref >60)
Glucose, Bld: 65 mg/dL — ABNORMAL LOW (ref 70–99)
Potassium: 3 mmol/L — ABNORMAL LOW (ref 3.5–5.1)
Sodium: 138 mmol/L (ref 135–145)
Total Bilirubin: 0.7 mg/dL (ref 0.3–1.2)
Total Protein: 6.2 g/dL — ABNORMAL LOW (ref 6.5–8.1)

## 2020-03-13 LAB — MAGNESIUM: Magnesium: 1.6 mg/dL — ABNORMAL LOW (ref 1.7–2.4)

## 2020-03-13 LAB — CBG MONITORING, ED: Glucose-Capillary: 92 mg/dL (ref 70–99)

## 2020-03-13 MED ORDER — BRINZOLAMIDE 1 % OP SUSP
1.0000 [drp] | Freq: Two times a day (BID) | OPHTHALMIC | Status: DC
  Administered 2020-03-13 – 2020-03-17 (×8): 1 [drp] via OPHTHALMIC
  Filled 2020-03-13: qty 10

## 2020-03-13 MED ORDER — DEXTROSE IN LACTATED RINGERS 5 % IV SOLN: INTRAVENOUS | Status: DC

## 2020-03-13 MED ORDER — LATANOPROST 0.005 % OP SOLN
1.0000 [drp] | Freq: Every day | OPHTHALMIC | Status: DC
  Administered 2020-03-13 – 2020-03-16 (×4): 1 [drp] via OPHTHALMIC
  Filled 2020-03-13 (×2): qty 2.5

## 2020-03-13 MED ORDER — THIAMINE HCL 100 MG PO TABS
100.0000 mg | ORAL_TABLET | Freq: Every day | ORAL | Status: DC
  Administered 2020-03-16 – 2020-03-17 (×2): 100 mg via ORAL
  Filled 2020-03-13 (×4): qty 1

## 2020-03-13 MED ORDER — ONDANSETRON HCL 4 MG/2ML IJ SOLN
4.0000 mg | Freq: Once | INTRAMUSCULAR | Status: AC
  Administered 2020-03-13: 4 mg via INTRAVENOUS
  Filled 2020-03-13: qty 2

## 2020-03-13 MED ORDER — POTASSIUM CHLORIDE 10 MEQ/100ML IV SOLN
10.0000 meq | Freq: Once | INTRAVENOUS | Status: AC
  Administered 2020-03-13: 10 meq via INTRAVENOUS
  Filled 2020-03-13: qty 100

## 2020-03-13 MED ORDER — PANTOPRAZOLE SODIUM 40 MG IV SOLR
40.0000 mg | Freq: Two times a day (BID) | INTRAVENOUS | Status: DC
  Administered 2020-03-13 – 2020-03-17 (×9): 40 mg via INTRAVENOUS
  Filled 2020-03-13 (×9): qty 40

## 2020-03-13 MED ORDER — SODIUM CHLORIDE 0.9 % IV BOLUS
1000.0000 mL | Freq: Once | INTRAVENOUS | Status: AC
  Administered 2020-03-13: 1000 mL via INTRAVENOUS

## 2020-03-13 MED ORDER — THIAMINE HCL 100 MG/ML IJ SOLN
100.0000 mg | Freq: Every day | INTRAMUSCULAR | Status: DC
  Administered 2020-03-13 – 2020-03-15 (×3): 100 mg via INTRAVENOUS
  Filled 2020-03-13 (×3): qty 2

## 2020-03-13 MED ORDER — LORAZEPAM 2 MG/ML IJ SOLN
1.0000 mg | INTRAMUSCULAR | Status: AC | PRN
  Administered 2020-03-15: 1 mg via INTRAVENOUS
  Administered 2020-03-15: 2 mg via INTRAVENOUS
  Administered 2020-03-15: 1 mg via INTRAVENOUS
  Administered 2020-03-16: 2 mg via INTRAVENOUS
  Filled 2020-03-13 (×4): qty 1

## 2020-03-13 MED ORDER — HYDROMORPHONE HCL 1 MG/ML IJ SOLN: 0.5000 mg | INTRAMUSCULAR | Status: DC | PRN

## 2020-03-13 MED ORDER — LORAZEPAM 1 MG PO TABS: 1.0000 mg | ORAL_TABLET | ORAL | Status: AC | PRN

## 2020-03-13 MED ORDER — PANTOPRAZOLE SODIUM 40 MG IV SOLR
40.0000 mg | Freq: Once | INTRAVENOUS | Status: AC
  Administered 2020-03-13: 40 mg via INTRAVENOUS
  Filled 2020-03-13: qty 40

## 2020-03-13 MED ORDER — PHENOL 1.4 % MT LIQD
1.0000 | OROMUCOSAL | Status: DC | PRN
  Filled 2020-03-13: qty 177

## 2020-03-13 MED ORDER — ALBUTEROL SULFATE HFA 108 (90 BASE) MCG/ACT IN AERS: 2.0000 | INHALATION_SPRAY | RESPIRATORY_TRACT | Status: DC | PRN

## 2020-03-13 MED ORDER — DEXTROSE 50 % IV SOLN
25.0000 mL | Freq: Once | INTRAVENOUS | Status: AC
  Administered 2020-03-13: 25 mL via INTRAVENOUS
  Filled 2020-03-13: qty 50

## 2020-03-13 MED ORDER — MOMETASONE FURO-FORMOTEROL FUM 200-5 MCG/ACT IN AERO
2.0000 | INHALATION_SPRAY | Freq: Two times a day (BID) | RESPIRATORY_TRACT | Status: DC
  Administered 2020-03-13 – 2020-03-17 (×8): 2 via RESPIRATORY_TRACT
  Filled 2020-03-13: qty 8.8

## 2020-03-13 MED ORDER — ACETAMINOPHEN 325 MG PO TABS
650.0000 mg | ORAL_TABLET | Freq: Four times a day (QID) | ORAL | Status: DC | PRN
  Administered 2020-03-15: 650 mg via ORAL
  Filled 2020-03-13: qty 2

## 2020-03-13 MED ORDER — ONDANSETRON HCL 4 MG/2ML IJ SOLN: 4.0000 mg | Freq: Four times a day (QID) | INTRAMUSCULAR | Status: DC | PRN

## 2020-03-13 MED ORDER — POTASSIUM CHLORIDE 10 MEQ/100ML IV SOLN
10.0000 meq | INTRAVENOUS | Status: AC
  Administered 2020-03-13 (×4): 10 meq via INTRAVENOUS
  Filled 2020-03-13 (×3): qty 100

## 2020-03-13 MED ORDER — ONDANSETRON HCL 4 MG PO TABS: 4.0000 mg | ORAL_TABLET | Freq: Four times a day (QID) | ORAL | Status: DC | PRN

## 2020-03-13 MED ORDER — MAGNESIUM SULFATE 2 GM/50ML IV SOLN
2.0000 g | Freq: Once | INTRAVENOUS | Status: AC
  Administered 2020-03-13: 2 g via INTRAVENOUS
  Filled 2020-03-13: qty 50

## 2020-03-13 MED ORDER — IOHEXOL 300 MG/ML  SOLN
75.0000 mL | Freq: Once | INTRAMUSCULAR | Status: AC | PRN
  Administered 2020-03-13: 75 mL via INTRAVENOUS

## 2020-03-13 MED ORDER — METOPROLOL TARTRATE 5 MG/5ML IV SOLN
2.5000 mg | Freq: Four times a day (QID) | INTRAVENOUS | Status: DC
  Administered 2020-03-13 – 2020-03-17 (×14): 2.5 mg via INTRAVENOUS
  Filled 2020-03-13 (×16): qty 5

## 2020-03-13 MED ORDER — ACETAMINOPHEN 650 MG RE SUPP: 650.0000 mg | Freq: Four times a day (QID) | RECTAL | Status: DC | PRN

## 2020-03-13 NOTE — H&P (Addendum)
History and Physical    NELWYN HEBDON NIO:[id number] DOB: [birth year] DOA: 03/13/2020  PCP: Sharion Balloon, FNP   Patient coming from: [facility]  Chief Complaint: Coffee-ground emesis  HPI: Samantha Huerta is a 75 y.o. female with medical history significant for COPD, hypertension, dyslipidemia, recent onset atrial fibrillation, alcohol use disorder with associated possible dementia, and recent admission for small bowel obstruction he was discharged on 12/31 after conservative management.  She presents back to the emergency department today with worsening nausea and vomiting that began at approximately 3 PM yesterday afternoon.  She was given some Zofran without relief of her symptoms and she began vomiting again this morning and coffee-ground emesis and therefore was brought to the ED via EMS.  Patient states that she has not had anything to eat in the last 2 or 3 days since discharge due to her nausea and abdominal pain.  She states her abdominal pain is generalized.  She states she did have a bowel movement yesterday that was nonbloody.  Denies any passage of flatus.  She denies any fevers or chills, chest pain, or shortness of breath.  She states that her last alcohol use was over 6 weeks ago and has not had anything to drink recently.   ED Course: Vital signs are stable and patient is otherwise afebrile.  Mild leukocytosis of 11,600 noted but hemoglobin is 11.6 and appears stable.  Glucose is 65.  Potassium is 3 and patient has been started on some supplementation.  She has been given some potassium and a fluid bolus as well as PPI and dextrose.  General surgery Dr. Constance Haw has been contacted by EDP with recommendation to place NG tube to low intermittent suction with further evaluation pending.  Review of Systems: Reviewed as noted above, otherwise negative.  Past Medical History:  Diagnosis Date  . Anxiety   . Arthritis   . COPD (chronic obstructive pulmonary disease) ([place])   . GERD  (gastroesophageal reflux disease)   . Glaucoma 1998   Dr Sharen Counter in [facility]  . Hyperlipidemia   . Hypertension   . Osteopenia     Past Surgical History:  Procedure Laterality Date  . ABDOMINAL HYSTERECTOMY    . CARPAL TUNNEL RELEASE Bilateral   . COLONOSCOPY WITH PROPOFOL N/A 10/20/2017   Procedure: COLONOSCOPY WITH PROPOFOL;  Surgeon: Daneil Dolin, MD;  Location: [facility];  Service: Endoscopy;  Laterality: N/A;  1:45pm  . ELBOW SURGERY Right   . SHOULDER SURGERY Left      reports that she has been smoking cigarettes. She has a 26.50 pack-year smoking history. She has never used smokeless tobacco. She reports previous alcohol use of about 5.0 standard drinks of alcohol per week. She reports that she does not use drugs.  No Known Allergies  Family History  Problem Relation Age of Onset  . Hip fracture Mother   . Glaucoma Sister   . Breast cancer Sister 80  . Diabetes Sister   . Glaucoma Sister   . Glaucoma Sister   . Glaucoma Sister   . Other Brother        killed in [place]  . Glaucoma Father   . Colon cancer Neg Hx     Prior to Admission medications   Medication Sig Start Date End Date Taking? Authorizing Provider  acetaminophen (TYLENOL) 500 MG tablet Take 500 mg by mouth every 6 (six) hours as needed for moderate pain or headache.    [provider]  albuterol (VENTOLIN HFA) 108 (90 Base) MCG/ACT inhaler Inhale 2 puffs into the lungs every 4 (four) hours as needed for wheezing or shortness of breath. 03/25/19   Sonny Masters, FNP  amoxicillin-clavulanate (AUGMENTIN) 875-125 MG tablet Take 1 tablet by mouth 2 (two) times daily for 3 days. 03/10/20 03/13/20  Danford, Earl Lites, MD  apixaban (ELIQUIS) 5 MG TABS tablet Take 1 tablet (5 mg total) by mouth 2 (two) times daily. 03/10/20   Danford, Earl Lites, MD  brinzolamide (AZOPT) 1 % ophthalmic suspension Place 1 drop into both eyes 2 (two) times daily.    [provider]  Cholecalciferol  (VITAMIN D3) 100000 UNIT/GM POWD Take by mouth.    [provider]  diltiazem (CARDIZEM CD) 300 MG 24 hr capsule Take 1 capsule (300 mg total) by mouth daily. 03/10/20 03/10/21  DanfordEarl Lites, MD  feeding supplement (ENSURE ENLIVE / ENSURE PLUS) LIQD Take 237 mLs by mouth 3 (three) times daily between meals. 03/10/20   Danford, Earl Lites, MD  Fluticasone-Salmeterol (ADVAIR) 250-50 MCG/DOSE AEPB Inhale 1 puff into the lungs in the morning and at bedtime. 12/02/19   [name], FNP  HYDROcodone-acetaminophen (NORCO) 5-325 MG tablet Take 1 tablet by mouth 2 (two) times daily as needed for moderate pain. 03/10/20 04/09/20  Danford, Earl Lites, MD  latanoprost (XALATAN) 0.005 % ophthalmic solution Place 1 drop into both eyes at bedtime.    [provider]  loperamide (IMODIUM A-D) 2 MG tablet Take 1 tablet (2 mg total) by mouth 4 (four) times daily as needed for diarrhea or loose stools. 03/10/20   Danford, Earl Lites, MD  Multiple Vitamins-Minerals (MULTIVITAMIN WITH MINERALS) tablet Take 1 tablet by mouth daily.    [provider]  naloxone [facility]) nasal spray 4 mg/0.1 mL Use as needed for respiratory depression or narcotic overdose 09/15/18   Sonny Masters, FNP  ondansetron (ZOFRAN) 4 MG tablet Take 1 tablet (4 mg total) by mouth every 8 (eight) hours as needed for nausea or vomiting. 12/16/18   Sonny Masters, FNP  potassium chloride 20 MEQ TBCR Take 20 mEq by mouth 2 (two) times daily for 7 days. 03/10/20 03/17/20  Alberteen Sam, MD  pravastatin (PRAVACHOL) 40 MG tablet TAKE ONE (1) TABLET EACH DAY Patient taking differently: Take 40 mg by mouth daily. 11/25/19   [name], FNP    Physical Exam: Vitals:   03/13/20 0905 03/13/20 1030 03/13/20 1100 03/13/20 1130  BP: 130/62 (!) 112/54 130/64 (!) 122/56  Pulse: 79 (!) 102  90  Resp: (!) 23 (!) 23 19 17   Temp: 98.3 F (36.8 C)     TempSrc: Oral     SpO2: 97% 95%  95%  Weight:       Height:        Constitutional: NAD, calm, comfortable Vitals:   03/13/20 0905 03/13/20 1030 03/13/20 1100 03/13/20 1130  BP: 130/62 (!) 112/54 130/64 (!) 122/56  Pulse: 79 (!) 102  90  Resp: (!) 23 (!) 23 19 17   Temp: 98.3 F (36.8 C)     TempSrc: Oral     SpO2: 97% 95%  95%  Weight:      Height:       Eyes: lids and conjunctivae normal Neck: normal, supple Respiratory: clear to auscultation bilaterally. Normal respiratory effort. No accessory muscle use.  Currently on room air. Cardiovascular: Regular rate and rhythm, no murmurs. Abdomen: no tenderness, no distention.  No significant bowel sounds  auscultated. Musculoskeletal:  No edema. Skin: no rashes, lesions, ulcers.  Psychiatric: Flat affect  Labs on Admission: I have personally reviewed following labs and imaging studies  CBC: Recent Labs  Lab 03/08/20 0539 03/09/20 0504 03/10/20 0514 03/13/20 0916  WBC 17.8* 14.3* 9.8 11.6*  NEUTROABS  --   --   --  9.7*  HGB 11.9* 10.7* 11.2* 11.6*  HCT 36.5 32.3* 33.3* 36.6  MCV 101.4* 100.3* 99.4 104.0*  PLT 406* 425* 517* 563*   Basic Metabolic Panel: Recent Labs  Lab 03/07/20 1713 03/08/20 0539 03/09/20 0504 03/10/20 0514 03/10/20 1057 03/13/20 0916  NA 137 134* 134* 133*  --  138  K 3.7 4.1 3.8 2.8* 2.9* 3.0*  CL 108 105 104 102  --  100  CO2 20* 19* 19* 21*  --  23  GLUCOSE 145* 114* 87 98  --  65*  BUN 23 23 14 12   --  10  CREATININE 0.49 0.52 0.46 0.52  --  0.63  CALCIUM 7.9* 8.3* 8.4* 8.5*  --  8.9  MG 1.6*  --  1.4* 1.8  --   --   PHOS  --  3.3 3.1  --   --   --    GFR: Estimated Creatinine Clearance: 53.7 mL/min (by C-G formula based on SCr of 0.63 mg/dL). Liver Function Tests: Recent Labs  Lab 03/08/20 0539 03/09/20 0504 03/13/20 0916  AST  --  22 24  ALT  --  31 27  ALKPHOS  --  89 93  BILITOT  --  0.5 0.7  PROT  --  5.3* 6.2*  ALBUMIN 2.2* 2.2* 3.0*   Recent Labs  Lab 03/13/20 0916  LIPASE 34   No results for input(s): AMMONIA in  the last 168 hours. Coagulation Profile: No results for input(s): INR, PROTIME in the last 168 hours. Cardiac Enzymes: No results for input(s): CKTOTAL, CKMB, CKMBINDEX, TROPONINI in the last 168 hours. BNP (last 3 results) No results for input(s): PROBNP in the last 8760 hours. HbA1C: No results for input(s): HGBA1C in the last 72 hours. CBG: Recent Labs  Lab 03/08/20 1612 03/09/20 0005 03/09/20 0454 03/09/20 1110 03/09/20 1608  GLUCAP 80 81 88 81 75   Lipid Profile: No results for input(s): CHOL, HDL, LDLCALC, TRIG, CHOLHDL, LDLDIRECT in the last 72 hours. Thyroid Function Tests: No results for input(s): TSH, T4TOTAL, FREET4, T3FREE, THYROIDAB in the last 72 hours. Anemia Panel: No results for input(s): VITAMINB12, FOLATE, FERRITIN, TIBC, IRON, RETICCTPCT in the last 72 hours. Urine analysis:    Component Value Date/Time   COLORURINE AMBER (A) 02/28/2020 2200   APPEARANCEUR HAZY (A) 02/28/2020 2200   APPEARANCEUR Clear 03/20/2017 1112   LABSPEC 1.021 02/28/2020 2200   PHURINE 5.0 02/28/2020 2200   GLUCOSEU NEGATIVE 02/28/2020 2200   HGBUR NEGATIVE 02/28/2020 2200   BILIRUBINUR SMALL (A) 02/28/2020 2200   BILIRUBINUR Negative 03/20/2017 1112   KETONESUR 5 (A) 02/28/2020 2200   PROTEINUR NEGATIVE 02/28/2020 2200   UROBILINOGEN negative 01/25/2014 1529   NITRITE NEGATIVE 02/28/2020 2200   LEUKOCYTESUR NEGATIVE 02/28/2020 2200    Radiological Exams on Admission: CT ABDOMEN PELVIS W CONTRAST  Result Date: 03/13/2020 CLINICAL DATA:  Diffuse abdominal pain and vomiting/hematemesis EXAM: CT ABDOMEN AND PELVIS WITH CONTRAST TECHNIQUE: Multidetector CT imaging of the abdomen and pelvis was performed using the standard protocol following bolus administration of intravenous contrast. CONTRAST:  19mL OMNIPAQUE IOHEXOL 300 MG/ML  SOLN COMPARISON:  February 28, 2020 FINDINGS: Lower chest: There  are pleural effusions bilaterally with bibasilar atelectasis/consolidation. Scattered foci  of coronary artery calcification noted. Hepatobiliary: There is evident hepatic steatosis. No focal liver lesions are appreciable. No appreciable biliary dilatation. Pancreas: No pancreatic mass or inflammatory focus. Spleen: No splenic lesions are evident. Adrenals/Urinary Tract: Adrenals bilaterally appear normal. There is a cyst in the mid right kidney posteriorly measuring 0.8 x 0.8 cm. No evident hydronephrosis on either side. There is no renal or ureteral calculus on either side. Stomach/Bowel: There is distension of the stomach with fluid and to a lesser extent air. There is severe dilatation of small bowel to the level of a transition zone in the right upper pelvis, at the proximal to mid ileal level. More distally in the ileum, there are loops of thickened bowel wall. The terminal ileum appears essentially unremarkable. There is no free air or portal venous air. There are colonic diverticula without diverticulitis. There is no appreciable colonic dilatation or colonic wall thickening evident. Vascular/Lymphatic: There is no abdominal aortic aneurysm. There is aortic and iliac artery atherosclerosis as well as several foci of pelvic arterial vascular calcification. Major venous structures appear patent. No adenopathy appreciable in the abdomen or pelvis. Reproductive: Uterus absent. No adnexal masses. A small amount of fluid is noted in the rightward aspect of the dependent portion of the pelvis. Other: Appendix not seen. Surgical clip in expected periappendiceal region area. No inflammation in this area. No abscess evident. No ascites appreciable beyond the mild fluid in the rightward aspect of the dependent portion of the pelvis. Musculoskeletal: There is degenerative change in the lower thoracic and lumbar regions. Spinal stenosis is evident at L3-4 due to disc protrusion and bony hypertrophy. Spinal stenosis also noted at L4-5 and L5-S1 due to similar factors. No blastic or lytic bone lesions. There is  degenerative change in each hip joint with subchondral cysts in each hip joint, more severe on the right than on the left. No intramuscular or abdominal wall lesions. IMPRESSION: 1. Bowel obstruction with transition zone in the proximal to mid ileal region. Wall thickening and several loops of more distal ileum which may represent underlying enteritis. Colonic diverticula noted without colonic diverticulitis. No colonic dilatation. Stomach severely distended with fluid and to a lesser extent air. No free air evident. 2. No abscess evident in the abdomen or pelvis. Mild ascites in the dependent portion the pelvis toward the right may be of reactive response to the degree of bowel inflammation and bowel obstruction. 3. Pleural effusions bilaterally with atelectasis and probable superimposed infiltrate in the lung bases. 4. Aortic Atherosclerosis (ICD10-I70.0). Atherosclerotic plaque in multiple pelvic arterial vessels as well as in the foci of coronary artery calcification. 5. Spinal stenosis at L3-4, L4-5, and L5-S1 due to combination of disc protrusion and bony hypertrophy. 6.  Hepatic steatosis. Electronically Signed   By: Bretta Bang III M.D.   On: 03/13/2020 11:08    EKG: Independently reviewed. SR 82bpm.  Assessment/Plan Active Problems:   Small bowel obstruction (HCC)    Small bowel obstruction with intractable nausea and vomiting/hematemesis -Appreciate general surgery consultation with plan for likely ex lap by 1/5, hold Eliquis for now -Recent discharge on 12/31 with conservative management -NG tube to low intermittent suction keep n.p.o. and on IV fluid -Replete potassium and magnesium as noted below -Maintain on PPI IV twice daily and monitor hemoglobin trend -Transfuse for hemoglobin less than 7 -Hematemesis likely related to Mallory-Weiss tear in the setting of nausea and vomiting as well as Eliquis use,  consider GI consultation if continues to persist  Recent aspiration  pneumonia secondary to above -No symptoms of shortness of breath and cough noted -Treated with Unasyn and Augmentin at previous admission -Continue to monitor  Hypokalemia -Replete and reevaluate in a.m. -Check magnesium -Goal potassium of 4 and magnesium 2 -Monitor on telemetry  Alcoholic dementia/recurrent alcohol use -Noted to have severe alcohol withdrawal delirium during prior admission requiring Precedex infusion -Monitor closely with CIWA protocol -Last alcoholic drink was over 6 weeks ago according to patient  Recent new onset atrial fibrillation with RVR-currently in sinus rhythm -2D echocardiogram within normal limits and TSH within normal limits recently -Hold oral Cardizem and maintain on IV metoprolol every 6 hours and monitor on telemetry -Hold apixaban given hematemesis  History of COPD -No active flare noted -Continue home medications with DuoNebs as needed   DVT prophylaxis: SCDs, hold home Eliquis Code Status: Full Family Communication: Discussed with daughter on phone 1/3 Disposition Plan: Admit for treatment of small bowel obstruction Consults called: General surgery-Dr. Henreitta Leber Admission status: Inpatient, Tele  Severity of Illness: The appropriate patient status for this patient is INPATIENT. Inpatient status is judged to be reasonable and necessary in order to provide the required intensity of service to ensure the patient's safety. The patient's presenting symptoms, physical exam findings, and initial radiographic and laboratory data in the context of their chronic comorbidities is felt to place them at high risk for further clinical deterioration. Furthermore, it is not anticipated that the patient will be medically stable for discharge from the hospital within 2 midnights of admission. The following factors support the patient status of inpatient.   " The patient's presenting symptoms include N/V. " The worrisome physical exam findings include abdominal  distention. " The initial radiographic and laboratory data are worrisome because of bowel obstruction. " The chronic co-morbidities include COPD, Afib.   * I certify that at the point of admission it is my clinical judgment that the patient will require inpatient hospital care spanning beyond 2 midnights from the point of admission due to high intensity of service, high risk for further deterioration and high frequency of surveillance required.*    Zaidee Rion D Alicha Raspberry DO [facility]  If 7PM-7AM, please contact night-coverage [email or web address]  03/13/2020, 11:52 AM

## 2020-03-13 NOTE — ED Notes (Signed)
ED TO INPATIENT HANDOFF REPORT  ED Nurse Name and Phone #:   S Name/Age/Gender Samantha Huerta 75 y.o. female Room/Bed: APA14/APA14  Code Status   Code Status: Full Code  Home/SNF/Other Nursing Home Patient oriented to: self, place, time and situation Is this baseline? Yes   Triage Complete: Triage complete  Chief Complaint Small bowel obstruction (HCC) [K56.609]  Triage Note Pt arrives by [facility] for vomiting blood. Pt states she started vomiting blood around 3pm yesterday afternoon. Pt was given zofran but started vomiting again this morning and day shift nurse sent pt to be evaluated. Pt states she was already diagnosed with small bowel obstruction.    Allergies No Known Allergies  Level of Care/Admitting Diagnosis ED Disposition    ED Disposition Condition Comment   Admit  Hospital Area: [facility] [[id number]]  Level of Care: Telemetry [5]  Covid Evaluation: Asymptomatic Screening Protocol (No Symptoms)  Diagnosis: Small bowel obstruction [facility]) [[id number]]  Admitting Physician: Erick Blinks [[id number]]  Attending Physician: Erick Blinks [[id number]]  Estimated length of stay: past midnight tomorrow  Certification:: I certify this patient will need inpatient services for at least 2 midnights       B Medical/Surgery History Past Medical History:  Diagnosis Date  . Anxiety   . Arthritis   . COPD (chronic obstructive pulmonary disease) (HCC)   . GERD (gastroesophageal reflux disease)   . Glaucoma 1998   Dr Carlynn Purl in [facility]  . Hyperlipidemia   . Hypertension   . Osteopenia    Past Surgical History:  Procedure Laterality Date  . ABDOMINAL HYSTERECTOMY    . CARPAL TUNNEL RELEASE Bilateral   . COLONOSCOPY WITH PROPOFOL N/A 10/20/2017   Procedure: COLONOSCOPY WITH PROPOFOL;  Surgeon: Corbin Ade, MD;  Location: [facility];  Service: Endoscopy;  Laterality: N/A;  1:45pm  . ELBOW SURGERY Right   . SHOULDER SURGERY Left      A IV  Location/Drains/Wounds Patient Lines/Drains/Airways Status    Active Line/Drains/Airways    Name Placement date Placement time Site Days   Peripheral IV 03/13/20 Right Antecubital 03/13/20  0919  Antecubital  less than 1   NG/OG Tube Nasogastric 12 Fr. Right nare Xray 03/13/20  1325  Right nare  less than 1   External Urinary Catheter 03/13/20  7628  -  less than 1          Intake/Output Last 24 hours  Intake/Output Summary (Last 24 hours) at 03/13/2020 1538 Last data filed at 03/13/2020 1437 Gross per 24 hour  Intake 200 ml  Output 2000 ml  Net -1800 ml    Labs/Imaging Results for orders placed or performed during the hospital encounter of 03/13/20 (from the past 48 hour(s))  Lipase, blood     Status: None   Collection Time: 03/13/20  9:16 AM  Result Value Ref Range   Lipase 34 11 - 51 U/L    Comment: Performed at [facility], [address] [place], [place] [address]  Comprehensive metabolic panel     Status: Abnormal   Collection Time: 03/13/20  9:16 AM  Result Value Ref Range   Sodium 138 135 - 145 mmol/L   Potassium 3.0 (L) 3.5 - 5.1 mmol/L   Chloride 100 98 - 111 mmol/L   CO2 23 22 - 32 mmol/L   Glucose, Bld 65 (L) 70 - 99 mg/dL    Comment: Glucose reference range applies only to samples taken after fasting for at least 8 hours.  BUN 10 8 - 23 mg/dL   Creatinine, Ser 7.02 0.44 - 1.00 mg/dL   Calcium 8.9 8.9 - 63.7 mg/dL   Total Protein 6.2 (L) 6.5 - 8.1 g/dL   Albumin 3.0 (L) 3.5 - 5.0 g/dL   AST 24 15 - 41 U/L   ALT 27 0 - 44 U/L   Alkaline Phosphatase 93 38 - 126 U/L   Total Bilirubin 0.7 0.3 - 1.2 mg/dL   GFR, Estimated >85 >88 mL/min    Comment: (NOTE) Calculated using the CKD-EPI Creatinine Equation (2021)    Anion gap 15 5 - 15    Comment: Performed at [facility], [address] [place], [place] [address]  CBC     Status: Abnormal   Collection Time: 03/13/20  9:16 AM  Result Value Ref Range   WBC 11.6 (H) 4.0 - 10.5 K/uL   RBC 3.52 (L) 3.87 - 5.11  MIL/uL   Hemoglobin 11.6 (L) 12.0 - 15.0 g/dL   HCT 41.2 87.8 - 67.6 %   MCV 104.0 (H) 80.0 - 100.0 fL   MCH 33.0 26.0 - 34.0 pg   MCHC 31.7 30.0 - 36.0 g/dL   RDW 72.0 94.7 - 09.6 %   Platelets 563 (H) 150 - 400 K/uL   nRBC 0.0 0.0 - 0.2 %    Comment: Performed at [facility], [address], [place], [place] [address]  Differential     Status: Abnormal   Collection Time: 03/13/20  9:16 AM  Result Value Ref Range   Neutrophils Relative % 87 %   Neutro Abs 9.7 (H) 1.7 - 7.7 K/uL   Lymphocytes Relative 4 %   Lymphs Abs 0.5 (L) 0.7 - 4.0 K/uL   Monocytes Relative 9 %   Monocytes Absolute 1.0 0.1 - 1.0 K/uL   Eosinophils Relative 0 %   Eosinophils Absolute 0.0 0.0 - 0.5 K/uL   Basophils Relative 0 %   Basophils Absolute 0.0 0.0 - 0.1 K/uL   Immature Granulocytes 0 %   Abs Immature Granulocytes 0.05 0.00 - 0.07 K/uL    Comment: Performed at [facility], [address], [place], [place] [address]  Magnesium     Status: Abnormal   Collection Time: 03/13/20  9:16 AM  Result Value Ref Range   Magnesium 1.6 (L) 1.7 - 2.4 mg/dL    Comment: Performed at [facility], [address] [place], [place] [address]  Type and screen     Status: None (Preliminary result)   Collection Time: 03/13/20  9:43 AM  Result Value Ref Range   ABO/RH(D) A POS    Antibody Screen PENDING    Sample Expiration      03/16/2020,2359 Performed at [facility], [address] [place], [place] [address]   CBG monitoring, ED     Status: None   Collection Time: 03/13/20 12:29 PM  Result Value Ref Range   Glucose-Capillary 92 70 - 99 mg/dL    Comment: Glucose reference range applies only to samples taken after fasting for at least 8 hours.   CT ABDOMEN PELVIS W CONTRAST  Result Date: 03/13/2020 CLINICAL DATA:  Diffuse abdominal pain and vomiting/hematemesis EXAM: CT ABDOMEN AND PELVIS WITH CONTRAST TECHNIQUE: Multidetector CT imaging of the abdomen and pelvis was performed using the standard protocol following  bolus administration of intravenous contrast. CONTRAST:  96mL OMNIPAQUE IOHEXOL 300 MG/ML  SOLN COMPARISON:  February 28, 2020 FINDINGS: Lower chest: There are pleural effusions bilaterally with bibasilar atelectasis/consolidation. Scattered foci of  coronary artery calcification noted. Hepatobiliary: There is evident hepatic steatosis. No focal liver lesions are appreciable. No appreciable biliary dilatation. Pancreas: No pancreatic mass or inflammatory focus. Spleen: No splenic lesions are evident. Adrenals/Urinary Tract: Adrenals bilaterally appear normal. There is a cyst in the mid right kidney posteriorly measuring 0.8 x 0.8 cm. No evident hydronephrosis on either side. There is no renal or ureteral calculus on either side. Stomach/Bowel: There is distension of the stomach with fluid and to a lesser extent air. There is severe dilatation of small bowel to the level of a transition zone in the right upper pelvis, at the proximal to mid ileal level. More distally in the ileum, there are loops of thickened bowel wall. The terminal ileum appears essentially unremarkable. There is no free air or portal venous air. There are colonic diverticula without diverticulitis. There is no appreciable colonic dilatation or colonic wall thickening evident. Vascular/Lymphatic: There is no abdominal aortic aneurysm. There is aortic and iliac artery atherosclerosis as well as several foci of pelvic arterial vascular calcification. Major venous structures appear patent. No adenopathy appreciable in the abdomen or pelvis. Reproductive: Uterus absent. No adnexal masses. A small amount of fluid is noted in the rightward aspect of the dependent portion of the pelvis. Other: Appendix not seen. Surgical clip in expected periappendiceal region area. No inflammation in this area. No abscess evident. No ascites appreciable beyond the mild fluid in the rightward aspect of the dependent portion of the pelvis. Musculoskeletal: There is  degenerative change in the lower thoracic and lumbar regions. Spinal stenosis is evident at L3-4 due to disc protrusion and bony hypertrophy. Spinal stenosis also noted at L4-5 and L5-S1 due to similar factors. No blastic or lytic bone lesions. There is degenerative change in each hip joint with subchondral cysts in each hip joint, more severe on the right than on the left. No intramuscular or abdominal wall lesions. IMPRESSION: 1. Bowel obstruction with transition zone in the proximal to mid ileal region. Wall thickening and several loops of more distal ileum which may represent underlying enteritis. Colonic diverticula noted without colonic diverticulitis. No colonic dilatation. Stomach severely distended with fluid and to a lesser extent air. No free air evident. 2. No abscess evident in the abdomen or pelvis. Mild ascites in the dependent portion the pelvis toward the right may be of reactive response to the degree of bowel inflammation and bowel obstruction. 3. Pleural effusions bilaterally with atelectasis and probable superimposed infiltrate in the lung bases. 4. Aortic Atherosclerosis (ICD10-I70.0). Atherosclerotic plaque in multiple pelvic arterial vessels as well as in the foci of coronary artery calcification. 5. Spinal stenosis at L3-4, L4-5, and L5-S1 due to combination of disc protrusion and bony hypertrophy. 6.  Hepatic steatosis. Electronically Signed   By: Bretta Bang III M.D.   On: 03/13/2020 11:08   DG Chest Portable 1 View  Result Date: 03/13/2020 CLINICAL DATA:  Nasogastric placement EXAM: PORTABLE CHEST 1 VIEW COMPARISON:  02/28/2020 FINDINGS: Nasogastric tube enters the stomach with its tip in the mid body. There is mild to moderate atelectasis or patchy infiltrate in the left lower lobe. Minimal atelectasis at the right base. No evidence of heart failure or effusion. IMPRESSION: 1. Nasogastric tube tip in the mid body of the stomach. 2. Atelectasis or patchy infiltrate in the left  lower lobe. Electronically Signed   By: Paulina Fusi M.D.   On: 03/13/2020 13:44    Pending Labs Unresulted Labs (From admission, onward)  Start     Ordered   03/14/20 0500  Magnesium  Tomorrow morning,   R        03/13/20 1243   03/14/20 0500  Comprehensive metabolic panel  Tomorrow morning,   R        03/13/20 1243   03/14/20 0500  CBC  Tomorrow morning,   R        03/13/20 1243   03/13/20 1244  SARS CORONAVIRUS 2 (TAT 6-24 HRS) Nasopharyngeal Nasopharyngeal Swab  (Tier 3 - Asymptomatic with Precautions)  Once,   STAT       Question Answer Comment  Is this test for diagnosis or screening Screening   Symptomatic for COVID-19 as defined by CDC No   Hospitalized for COVID-19 No   Admitted to ICU for COVID-19 No   Previously tested for COVID-19 Yes   Resident in a congregate (group) care setting Yes   Employed in healthcare setting No   Pregnant No   Has patient completed COVID vaccination(s) (2 doses of Pfizer/Moderna 1 dose of [employer]) Unknown      03/13/20 1244   03/13/20 0859  Urinalysis, Routine w reflex microscopic  ONCE - STAT,   STAT        03/13/20 0858          Vitals/Pain Today's Vitals   03/13/20 1230 03/13/20 1300 03/13/20 1330 03/13/20 1400  BP: 119/64 125/61 (!) 112/59 (!) 119/53  Pulse: 92 94 96 84  Resp: 16 19 18 17   Temp:      TempSrc:      SpO2: 95% 95% 94% 96%  Weight:      Height:      PainSc:        Isolation Precautions Airborne and Contact precautions  Medications Medications  potassium chloride 10 mEq in 100 mL IVPB (10 mEq Intravenous New Bag/Given 03/13/20 1501)  albuterol (VENTOLIN HFA) 108 (90 Base) MCG/ACT inhaler 2 puff (has no administration in time range)  mometasone-formoterol (DULERA) 200-5 MCG/ACT inhaler 2 puff (has no administration in time range)  brinzolamide (AZOPT) 1 % ophthalmic suspension 1 drop (has no administration in time range)  latanoprost (XALATAN) 0.005 % ophthalmic solution 1 drop (has no  administration in time range)  dextrose 5 % in lactated ringers infusion (has no administration in time range)  acetaminophen (TYLENOL) tablet 650 mg (has no administration in time range)    Or  acetaminophen (TYLENOL) suppository 650 mg (has no administration in time range)  HYDROmorphone (DILAUDID) injection 0.5-1 mg (has no administration in time range)  ondansetron (ZOFRAN) tablet 4 mg (has no administration in time range)    Or  ondansetron (ZOFRAN) injection 4 mg (has no administration in time range)  pantoprazole (PROTONIX) injection 40 mg (40 mg Intravenous Given 03/13/20 1439)  LORazepam (ATIVAN) tablet 1-4 mg (has no administration in time range)    Or  LORazepam (ATIVAN) injection 1-4 mg (has no administration in time range)  thiamine tablet 100 mg ( Oral See Alternative 03/13/20 1448)    Or  thiamine (B-1) injection 100 mg (100 mg Intravenous Given 03/13/20 1448)  metoprolol tartrate (LOPRESSOR) injection 2.5 mg (2.5 mg Intravenous Not Given 03/13/20 1413)  phenol (CHLORASEPTIC) mouth spray 1 spray (has no administration in time range)  sodium chloride 0.9 % bolus 1,000 mL (1,000 mLs Intravenous New Bag/Given 03/13/20 0947)  ondansetron (ZOFRAN) injection 4 mg (4 mg Intravenous Given 03/13/20 0947)  pantoprazole (PROTONIX) injection 40 mg (40 mg Intravenous Given 03/13/20  1610)  iohexol (OMNIPAQUE) 300 MG/ML solution 75 mL (75 mLs Intravenous Contrast Given 03/13/20 1047)  dextrose 50 % solution 25 mL (25 mLs Intravenous Given 03/13/20 1116)  potassium chloride 10 mEq in 100 mL IVPB (0 mEq Intravenous Stopped 03/13/20 1218)    Mobility  High fall risk   Focused Assessments    R Recommendations: See Admitting Provider Note  Report given to:   Additional Notes:

## 2020-03-13 NOTE — ED Provider Notes (Signed)
[facility] EMERGENCY DEPARTMENT Provider Note   CSN: [id number] Arrival date & time: 03/13/20  0847     History Chief Complaint  Patient presents with  . Hematemesis    Samantha Huerta is a 75 y.o. female.  Patient has a history of small bowel obstruction.  She also is taking Eliquis for atrial fibrillation started vomiting some coffee-ground material yesterday she continues to feel nauseated and is vomiting  The history is provided by the patient. No language interpreter was used.  Emesis Severity:  Moderate Timing:  Constant Quality:  Coffee grounds Able to tolerate:  Liquids Progression:  Worsening Chronicity:  New Recent urination:  Normal Relieved by:  Nothing Worsened by:  Nothing Ineffective treatments:  None tried Associated symptoms: abdominal pain   Associated symptoms: no cough, no diarrhea and no headaches        Past Medical History:  Diagnosis Date  . Anxiety   . Arthritis   . COPD (chronic obstructive pulmonary disease) (HCC)   . GERD (gastroesophageal reflux disease)   . Glaucoma 1998   Dr Carlynn Purl in [facility]  . Hyperlipidemia   . Hypertension   . Osteopenia     Patient Active Problem List   Diagnosis Date Noted  . Small bowel obstruction (HCC) 03/13/2020  . Protein-calorie malnutrition, severe 03/01/2020  . SBO (small bowel obstruction) (HCC) 02/28/2020  . AKI (acute kidney injury) (HCC)   . Hypotension due to hypovolemia   . Opiate dependence (HCC) 12/02/2019  . Controlled substance agreement signed 12/02/2019  . Vitamin D deficiency 03/25/2019  . Chronic midline low back pain without sciatica 12/25/2018  . Heme positive stool 07/31/2017  . Lung nodule 04/03/2017  . Pain management contract signed 01/05/2016  . Chronic back pain 05/12/2013  . Hypertension 07/27/2012  . Hyperlipidemia 07/27/2012  . GERD (gastroesophageal reflux disease) 07/27/2012  . COPD (chronic obstructive pulmonary disease) with chronic bronchitis (HCC)  07/27/2012  . Glaucoma 07/27/2012    Past Surgical History:  Procedure Laterality Date  . ABDOMINAL HYSTERECTOMY    . CARPAL TUNNEL RELEASE Bilateral   . COLONOSCOPY WITH PROPOFOL N/A 10/20/2017   Procedure: COLONOSCOPY WITH PROPOFOL;  Surgeon: Corbin Ade, MD;  Location: [facility];  Service: Endoscopy;  Laterality: N/A;  1:45pm  . ELBOW SURGERY Right   . SHOULDER SURGERY Left      OB History    Gravida  2   Para  2   Term  2   Preterm      AB      Living  2     SAB      IAB      Ectopic      Multiple      Live Births  2           Family History  Problem Relation Age of Onset  . Hip fracture Mother   . Glaucoma Sister   . Breast cancer Sister 38  . Diabetes Sister   . Glaucoma Sister   . Glaucoma Sister   . Glaucoma Sister   . Other Brother        killed in MVA  . Glaucoma Father   . Colon cancer Neg Hx     Social History   Tobacco Use  . Smoking status: Current Every Day Smoker    Packs/day: 0.50    Years: 53.00    Pack years: 26.50    Types: Cigarettes  . Smokeless tobacco: Never Used  Vaping  Use  . Vaping Use: Never used  Substance Use Topics  . Alcohol use: Not Currently    Alcohol/week: 5.0 standard drinks    Types: 1 Cans of beer, 4 Shots of liquor per week  . Drug use: No    Home Medications Prior to Admission medications   Medication Sig Start Date End Date Taking? Authorizing Provider  acetaminophen (TYLENOL) 500 MG tablet Take 500 mg by mouth every 6 (six) hours as needed for moderate pain or headache.    [provider]  albuterol (VENTOLIN HFA) 108 (90 Base) MCG/ACT inhaler Inhale 2 puffs into the lungs every 4 (four) hours as needed for wheezing or shortness of breath. 03/25/19   Sonny Masters, FNP  amoxicillin-clavulanate (AUGMENTIN) 875-125 MG tablet Take 1 tablet by mouth 2 (two) times daily for 3 days. 03/10/20 03/13/20  Danford, Earl Lites, MD  apixaban (ELIQUIS) 5 MG TABS tablet Take 1 tablet (5 mg  total) by mouth 2 (two) times daily. 03/10/20   Danford, Earl Lites, MD  brinzolamide (AZOPT) 1 % ophthalmic suspension Place 1 drop into both eyes 2 (two) times daily.    [provider]  Cholecalciferol (VITAMIN D3) 100000 UNIT/GM POWD Take by mouth.    [provider]  diltiazem (CARDIZEM CD) 300 MG 24 hr capsule Take 1 capsule (300 mg total) by mouth daily. 03/10/20 03/10/21  DanfordEarl Lites, MD  feeding supplement (ENSURE ENLIVE / ENSURE PLUS) LIQD Take 237 mLs by mouth 3 (three) times daily between meals. 03/10/20   Danford, Earl Lites, MD  Fluticasone-Salmeterol (ADVAIR) 250-50 MCG/DOSE AEPB Inhale 1 puff into the lungs in the morning and at bedtime. 12/02/19   [name], FNP  HYDROcodone-acetaminophen (NORCO) 5-325 MG tablet Take 1 tablet by mouth 2 (two) times daily as needed for moderate pain. 03/10/20 04/09/20  Danford, Earl Lites, MD  latanoprost (XALATAN) 0.005 % ophthalmic solution Place 1 drop into both eyes at bedtime.    [provider]  loperamide (IMODIUM A-D) 2 MG tablet Take 1 tablet (2 mg total) by mouth 4 (four) times daily as needed for diarrhea or loose stools. 03/10/20   Danford, Earl Lites, MD  Multiple Vitamins-Minerals (MULTIVITAMIN WITH MINERALS) tablet Take 1 tablet by mouth daily.    [provider]  naloxone [facility]) nasal spray 4 mg/0.1 mL Use as needed for respiratory depression or narcotic overdose 09/15/18   Sonny Masters, FNP  ondansetron (ZOFRAN) 4 MG tablet Take 1 tablet (4 mg total) by mouth every 8 (eight) hours as needed for nausea or vomiting. 12/16/18   Sonny Masters, FNP  potassium chloride 20 MEQ TBCR Take 20 mEq by mouth 2 (two) times daily for 7 days. 03/10/20 03/17/20  Alberteen Sam, MD  pravastatin (PRAVACHOL) 40 MG tablet TAKE ONE (1) TABLET EACH DAY Patient taking differently: Take 40 mg by mouth daily. 11/25/19   [name], FNP    Allergies    Patient has no known  allergies.  Review of Systems   Review of Systems  Constitutional: Negative for appetite change and fatigue.  HENT: Negative for congestion, ear discharge and sinus pressure.   Eyes: Negative for discharge.  Respiratory: Negative for cough.   Cardiovascular: Negative for chest pain.  Gastrointestinal: Positive for abdominal pain and vomiting. Negative for diarrhea.  Genitourinary: Negative for frequency and hematuria.  Musculoskeletal: Negative for back pain.  Skin: Negative for rash.  Neurological: Negative for seizures and headaches.  Psychiatric/Behavioral: Negative for hallucinations.  Physical Exam Updated Vital Signs BP (!) 122/56   Pulse 90   Temp 98.3 F (36.8 C) (Oral)   Resp 17   Ht 5\' 5"  (1.651 m)   Wt 56 kg   SpO2 95%   BMI 20.54 kg/m   Physical Exam Vitals and nursing note reviewed.  Constitutional:      Appearance: She is well-developed.  HENT:     Head: Normocephalic.     Nose: Nose normal.  Eyes:     General: No scleral icterus.    Extraocular Movements: EOM normal.     Conjunctiva/sclera: Conjunctivae normal.  Neck:     Thyroid: No thyromegaly.  Cardiovascular:     Rate and Rhythm: Normal rate and regular rhythm.     Heart sounds: No murmur heard. No friction rub. No gallop.   Pulmonary:     Breath sounds: No stridor. No wheezing or rales.  Chest:     Chest wall: No tenderness.  Abdominal:     General: There is no distension.     Tenderness: There is abdominal tenderness. There is no rebound.  Musculoskeletal:        General: No edema. Normal range of motion.     Cervical back: Neck supple.  Lymphadenopathy:     Cervical: No cervical adenopathy.  Skin:    Findings: No erythema or rash.  Neurological:     Mental Status: She is alert and oriented to person, place, and time.     Motor: No abnormal muscle tone.     Coordination: Coordination normal.  Psychiatric:        Mood and Affect: Mood and affect normal.        Behavior: Behavior  normal.     ED Results / Procedures / Treatments   Labs (all labs ordered are listed, but only abnormal results are displayed) Labs Reviewed  COMPREHENSIVE METABOLIC PANEL - Abnormal; Notable for the following components:      Result Value   Potassium 3.0 (*)    Glucose, Bld 65 (*)    Total Protein 6.2 (*)    Albumin 3.0 (*)    All other components within normal limits  CBC - Abnormal; Notable for the following components:   WBC 11.6 (*)    RBC 3.52 (*)    Hemoglobin 11.6 (*)    MCV 104.0 (*)    Platelets 563 (*)    All other components within normal limits  DIFFERENTIAL - Abnormal; Notable for the following components:   Neutro Abs 9.7 (*)    Lymphs Abs 0.5 (*)    All other components within normal limits  LIPASE, BLOOD  URINALYSIS, ROUTINE W REFLEX MICROSCOPIC  MAGNESIUM  TYPE AND SCREEN    EKG EKG Interpretation  Date/Time:  Monday March 13 2020 09:10:28 EST Ventricular Rate:  82 PR Interval:    QRS Duration: 127 QT Interval:  446 QTC Calculation: 521 R Axis:   91 Text Interpretation: Sinus rhythm Atrial premature complex RBBB and LPFB Nonspecific T abnormalities, lateral leads Confirmed by [id number] [phone]) on 03/13/2020 11:42:54 AM   Radiology CT ABDOMEN PELVIS W CONTRAST  Result Date: 03/13/2020 CLINICAL DATA:  Diffuse abdominal pain and vomiting/hematemesis EXAM: CT ABDOMEN AND PELVIS WITH CONTRAST TECHNIQUE: Multidetector CT imaging of the abdomen and pelvis was performed using the standard protocol following bolus administration of intravenous contrast. CONTRAST:  67mL OMNIPAQUE IOHEXOL 300 MG/ML  SOLN COMPARISON:  February 28, 2020 FINDINGS: Lower chest: There are pleural effusions bilaterally with  bibasilar atelectasis/consolidation. Scattered foci of coronary artery calcification noted. Hepatobiliary: There is evident hepatic steatosis. No focal liver lesions are appreciable. No appreciable biliary dilatation. Pancreas: No pancreatic mass or  inflammatory focus. Spleen: No splenic lesions are evident. Adrenals/Urinary Tract: Adrenals bilaterally appear normal. There is a cyst in the mid right kidney posteriorly measuring 0.8 x 0.8 cm. No evident hydronephrosis on either side. There is no renal or ureteral calculus on either side. Stomach/Bowel: There is distension of the stomach with fluid and to a lesser extent air. There is severe dilatation of small bowel to the level of a transition zone in the right upper pelvis, at the proximal to mid ileal level. More distally in the ileum, there are loops of thickened bowel wall. The terminal ileum appears essentially unremarkable. There is no free air or portal venous air. There are colonic diverticula without diverticulitis. There is no appreciable colonic dilatation or colonic wall thickening evident. Vascular/Lymphatic: There is no abdominal aortic aneurysm. There is aortic and iliac artery atherosclerosis as well as several foci of pelvic arterial vascular calcification. Major venous structures appear patent. No adenopathy appreciable in the abdomen or pelvis. Reproductive: Uterus absent. No adnexal masses. A small amount of fluid is noted in the rightward aspect of the dependent portion of the pelvis. Other: Appendix not seen. Surgical clip in expected periappendiceal region area. No inflammation in this area. No abscess evident. No ascites appreciable beyond the mild fluid in the rightward aspect of the dependent portion of the pelvis. Musculoskeletal: There is degenerative change in the lower thoracic and lumbar regions. Spinal stenosis is evident at L3-4 due to disc protrusion and bony hypertrophy. Spinal stenosis also noted at L4-5 and L5-S1 due to similar factors. No blastic or lytic bone lesions. There is degenerative change in each hip joint with subchondral cysts in each hip joint, more severe on the right than on the left. No intramuscular or abdominal wall lesions. IMPRESSION: 1. Bowel obstruction  with transition zone in the proximal to mid ileal region. Wall thickening and several loops of more distal ileum which may represent underlying enteritis. Colonic diverticula noted without colonic diverticulitis. No colonic dilatation. Stomach severely distended with fluid and to a lesser extent air. No free air evident. 2. No abscess evident in the abdomen or pelvis. Mild ascites in the dependent portion the pelvis toward the right may be of reactive response to the degree of bowel inflammation and bowel obstruction. 3. Pleural effusions bilaterally with atelectasis and probable superimposed infiltrate in the lung bases. 4. Aortic Atherosclerosis (ICD10-I70.0). Atherosclerotic plaque in multiple pelvic arterial vessels as well as in the foci of coronary artery calcification. 5. Spinal stenosis at L3-4, L4-5, and L5-S1 due to combination of disc protrusion and bony hypertrophy. 6.  Hepatic steatosis. Electronically Signed   By: Bretta Bang III M.D.   On: 03/13/2020 11:08    Procedures Procedures (including critical care time)  Medications Ordered in ED Medications  potassium chloride 10 mEq in 100 mL IVPB (10 mEq Intravenous New Bag/Given 03/13/20 1124)  potassium chloride 10 mEq in 100 mL IVPB (has no administration in time range)  sodium chloride 0.9 % bolus 1,000 mL (1,000 mLs Intravenous New Bag/Given 03/13/20 0947)  ondansetron (ZOFRAN) injection 4 mg (4 mg Intravenous Given 03/13/20 0947)  pantoprazole (PROTONIX) injection 40 mg (40 mg Intravenous Given 03/13/20 0946)  iohexol (OMNIPAQUE) 300 MG/ML solution 75 mL (75 mLs Intravenous Contrast Given 03/13/20 1047)  dextrose 50 % solution 25 mL (25 mLs Intravenous  Given 03/13/20 1116)    ED Course  I have reviewed the triage vital signs and the nursing notes.  Pertinent labs & imaging results that were available during my care of the patient were reviewed by me and considered in my medical decision making (see chart for details).    CRITICAL  CARE Performed by: Milton Ferguson Total critical care time: 45 minutes Critical care time was exclusive of separately billable procedures and treating other patients. Critical care was necessary to treat or prevent imminent or life-threatening deterioration. Critical care was time spent personally by me on the following activities: development of treatment plan with patient and/or surrogate as well as nursing, discussions with consultants, evaluation of patient's response to treatment, examination of patient, obtaining history from patient or surrogate, ordering and performing treatments and interventions, ordering and review of laboratory studies, ordering and review of radiographic studies, pulse oximetry and re-evaluation of patient's condition. Patient will be seen by general surgery and admitted by medicine MDM Rules/Calculators/A&P                          Patient with small bowel obstruction and upper GI bleed.  She will be seen by general surgery and admitted by medicine.  She was given Protonix and will get an NG tube Final Clinical Impression(s) / ED Diagnoses Final diagnoses:  None    Rx / DC Orders ED Discharge Orders    None       Milton Ferguson, MD 03/13/20 1153

## 2020-03-13 NOTE — ED Triage Notes (Signed)
Pt arrives by [facility] for vomiting blood. Pt states she started vomiting blood around 3pm yesterday afternoon. Pt was given zofran but started vomiting again this morning and day shift nurse sent pt to be evaluated. Pt states she was already diagnosed with small bowel obstruction.

## 2020-03-13 NOTE — Progress Notes (Signed)
Admitted to room , alert and oriented. Denies nausea and pain, abd soft.  NG tube in place and connected to intermittent suction and has just now put out small amount of pink drainage from using chloraseptic spray.  Purwick in place.  Sacral dressing placed as well as heel protectors.  Daughter at bedside. Tele called and QTC was 504.  Passed this along to Dr. Sherryll Burger

## 2020-03-13 NOTE — Consult Note (Signed)
[facility] Consult  Reason for Consult: SBO Referring Physician:  Dr. Sherryll Burger and Dr. Estell Harpin   Chief Complaint    Hematemesis      HPI: Samantha Huerta is a 75 y.o. female with a recent SBO that resolved without operative management and complicated by patient getting delirious thought to be from withdrawal and getting aspiration pneumonia. She returns with reports of having nausea and vomiting and distention. She has been at [employer]. She says she has had some BMs but has not been eating much.  She has been having some hematemesis and has been on Eliquis for A fib.  She says her abdomen is tender. She was worked up in the ED and CT demonstrated a transition in the ileum with some thickened bowel surrounding possibly enteritis but more likely will be reactive.  Past Medical History:  Diagnosis Date  . Anxiety   . Arthritis   . COPD (chronic obstructive pulmonary disease) (HCC)   . GERD (gastroesophageal reflux disease)   . Glaucoma 1998   Dr Carlynn Purl in [facility]  . Hyperlipidemia   . Hypertension   . Osteopenia     Past Surgical History:  Procedure Laterality Date  . ABDOMINAL HYSTERECTOMY    . CARPAL TUNNEL RELEASE Bilateral   . COLONOSCOPY WITH PROPOFOL N/A 10/20/2017   Procedure: COLONOSCOPY WITH PROPOFOL;  Surgeon: Corbin Ade, MD;  Location: [facility];  Service: Endoscopy;  Laterality: N/A;  1:45pm  . ELBOW SURGERY Right   . SHOULDER SURGERY Left     Family History  Problem Relation Age of Onset  . Hip fracture Mother   . Glaucoma Sister   . Breast cancer Sister 73  . Diabetes Sister   . Glaucoma Sister   . Glaucoma Sister   . Glaucoma Sister   . Other Brother        killed in MVA  . Glaucoma Father   . Colon cancer Neg Hx     Social History   Tobacco Use  . Smoking status: Current Every Day Smoker    Packs/day: 0.50    Years: 53.00    Pack years: 26.50    Types: Cigarettes  . Smokeless tobacco: Never Used  Vaping Use  . Vaping  Use: Never used  Substance Use Topics  . Alcohol use: Not Currently    Alcohol/week: 5.0 standard drinks    Types: 1 Cans of beer, 4 Shots of liquor per week  . Drug use: No    Medications: I have reviewed the patient's current medications. Current Facility-Administered Medications  Medication Dose Route Frequency Provider Last Rate Last Admin  . acetaminophen (TYLENOL) tablet 650 mg  650 mg Oral Q6H PRN Sherryll Burger, Pratik D, DO       Or  . acetaminophen (TYLENOL) suppository 650 mg  650 mg Rectal Q6H PRN Sherryll Burger, Pratik D, DO      . albuterol (VENTOLIN HFA) 108 (90 Base) MCG/ACT inhaler 2 puff  2 puff Inhalation Q4H PRN Sherryll Burger, Pratik D, DO      . brinzolamide (AZOPT) 1 % ophthalmic suspension 1 drop  1 drop Both Eyes BID Sherryll Burger, Pratik D, DO      . dextrose 5 % in lactated ringers infusion   Intravenous Continuous Shah, Pratik D, DO      . HYDROmorphone (DILAUDID) injection 0.5-1 mg  0.5-1 mg Intravenous Q2H PRN Sherryll Burger, Pratik D, DO      . latanoprost (XALATAN) 0.005 % ophthalmic solution 1 drop  1  drop Both Eyes QHS Sherryll Burger, Pratik D, DO      . LORazepam (ATIVAN) tablet 1-4 mg  1-4 mg Oral Q1H PRN Sherryll Burger, Pratik D, DO       Or  . LORazepam (ATIVAN) injection 1-4 mg  1-4 mg Intravenous Q1H PRN Sherryll Burger, Pratik D, DO      . metoprolol tartrate (LOPRESSOR) injection 2.5 mg  2.5 mg Intravenous Q6H Shah, Pratik D, DO      . mometasone-formoterol (DULERA) 200-5 MCG/ACT inhaler 2 puff  2 puff Inhalation BID Sherryll Burger, Pratik D, DO      . ondansetron (ZOFRAN) tablet 4 mg  4 mg Oral Q6H PRN Sherryll Burger, Pratik D, DO       Or  . ondansetron (ZOFRAN) injection 4 mg  4 mg Intravenous Q6H PRN Sherryll Burger, Pratik D, DO      . pantoprazole (PROTONIX) injection 40 mg  40 mg Intravenous Q12H Shah, Pratik D, DO   40 mg at 03/13/20 1439  . phenol (CHLORASEPTIC) mouth spray 1 spray  1 spray Mouth/Throat PRN Sherryll Burger, Pratik D, DO      . potassium chloride 10 mEq in 100 mL IVPB  10 mEq Intravenous Q1 Hr x 4 Shah, Pratik D, DO   Stopped at 03/13/20  1437  . thiamine tablet 100 mg  100 mg Oral Daily Sherryll Burger, Pratik D, DO       Or  . thiamine (B-1) injection 100 mg  100 mg Intravenous Daily Sherryll Burger, Pratik D, DO       Current Outpatient Medications  Medication Sig Dispense Refill Last Dose  . acetaminophen (TYLENOL) 500 MG tablet Take 500 mg by mouth every 6 (six) hours as needed for moderate pain or headache.     . albuterol (VENTOLIN HFA) 108 (90 Base) MCG/ACT inhaler Inhale 2 puffs into the lungs every 4 (four) hours as needed for wheezing or shortness of breath. 8.5 g 11   . amoxicillin-clavulanate (AUGMENTIN) 875-125 MG tablet Take 1 tablet by mouth 2 (two) times daily for 3 days. 6 tablet 0   . apixaban (ELIQUIS) 5 MG TABS tablet Take 1 tablet (5 mg total) by mouth 2 (two) times daily. 60 tablet    . brinzolamide (AZOPT) 1 % ophthalmic suspension Place 1 drop into both eyes 2 (two) times daily.     . Cholecalciferol (VITAMIN D3) 100000 UNIT/GM POWD Take by mouth.     . diltiazem (CARDIZEM CD) 300 MG 24 hr capsule Take 1 capsule (300 mg total) by mouth daily. 30 capsule 11   . feeding supplement (ENSURE ENLIVE / ENSURE PLUS) LIQD Take 237 mLs by mouth 3 (three) times daily between meals. 237 mL 12   . Fluticasone-Salmeterol (ADVAIR) 250-50 MCG/DOSE AEPB Inhale 1 puff into the lungs in the morning and at bedtime. 60 each 5   . HYDROcodone-acetaminophen (NORCO) 5-325 MG tablet Take 1 tablet by mouth 2 (two) times daily as needed for moderate pain. 5 tablet 0   . latanoprost (XALATAN) 0.005 % ophthalmic solution Place 1 drop into both eyes at bedtime.     Marland Kitchen loperamide (IMODIUM A-D) 2 MG tablet Take 1 tablet (2 mg total) by mouth 4 (four) times daily as needed for diarrhea or loose stools. 30 tablet 0   . Multiple Vitamins-Minerals (MULTIVITAMIN WITH MINERALS) tablet Take 1 tablet by mouth daily.     . naloxone (NARCAN) nasal spray 4 mg/0.1 mL Use as needed for respiratory depression or narcotic overdose 1 each 3   . ondansetron (  ZOFRAN) 4 MG  tablet Take 1 tablet (4 mg total) by mouth every 8 (eight) hours as needed for nausea or vomiting. 20 tablet 0   . potassium chloride 20 MEQ TBCR Take 20 mEq by mouth 2 (two) times daily for 7 days. 14 tablet 0   . pravastatin (PRAVACHOL) 40 MG tablet TAKE ONE (1) TABLET EACH DAY (Patient taking differently: Take 40 mg by mouth daily.) 90 tablet 0    No Known Allergies   ROS:  A comprehensive review of systems was negative except for: Gastrointestinal: positive for abdominal pain, nausea and vomiting  Blood pressure (!) 122/56, pulse 90, temperature 98.3 F (36.8 C), temperature source Oral, resp. rate 17, height 5\' 5"  (1.651 m), weight 56 kg, SpO2 95 %. Physical Exam Vitals reviewed.  Constitutional:      Appearance: Normal appearance.  HENT:     Head: Normocephalic.     Nose: Nose normal.     Mouth/Throat:     Mouth: Mucous membranes are moist.  Eyes:     Extraocular Movements: Extraocular movements intact.  Cardiovascular:     Rate and Rhythm: Normal rate.  Pulmonary:     Effort: Pulmonary effort is normal.  Abdominal:     General: There is distension.     Palpations: Abdomen is soft.     Tenderness: There is abdominal tenderness.  Musculoskeletal:        General: Normal range of motion.     Cervical back: Normal range of motion.  Skin:    General: Skin is warm.  Neurological:     General: No focal deficit present.     Mental Status: She is alert and oriented to person, place, and time.  Psychiatric:        Mood and Affect: Mood normal.        Behavior: Behavior normal.        Thought Content: Thought content normal.        Judgment: Judgment normal.     Results: Results for orders placed or performed during the hospital encounter of 03/13/20 (from the past 48 hour(s))  Lipase, blood     Status: None   Collection Time: 03/13/20  9:16 AM  Result Value Ref Range   Lipase 34 11 - 51 U/L    Comment: Performed at [facility], [address], [place], [place]  [place]  Comprehensive metabolic panel     Status: Abnormal   Collection Time: 03/13/20  9:16 AM  Result Value Ref Range   Sodium 138 135 - 145 mmol/L   Potassium 3.0 (L) 3.5 - 5.1 mmol/L   Chloride 100 98 - 111 mmol/L   CO2 23 22 - 32 mmol/L   Glucose, Bld 65 (L) 70 - 99 mg/dL    Comment: Glucose reference range applies only to samples taken after fasting for at least 8 hours.   BUN 10 8 - 23 mg/dL   Creatinine, Ser 05/11/20 0.44 - 1.00 mg/dL   Calcium 8.9 8.9 - 6.16 mg/dL   Total Protein 6.2 (L) 6.5 - 8.1 g/dL   Albumin 3.0 (L) 3.5 - 5.0 g/dL   AST 24 15 - 41 U/L   ALT 27 0 - 44 U/L   Alkaline Phosphatase 93 38 - 126 U/L   Total Bilirubin 0.7 0.3 - 1.2 mg/dL   GFR, Estimated 07.3 >71 mL/min    Comment: (NOTE) Calculated using the CKD-EPI Creatinine Equation (2021)    Anion gap 15 5 - 15  Comment: Performed at [facility], [address], [place], [place] [address]  CBC     Status: Abnormal   Collection Time: 03/13/20  9:16 AM  Result Value Ref Range   WBC 11.6 (H) 4.0 - 10.5 K/uL   RBC 3.52 (L) 3.87 - 5.11 MIL/uL   Hemoglobin 11.6 (L) 12.0 - 15.0 g/dL   HCT 01.7 49.4 - 49.6 %   MCV 104.0 (H) 80.0 - 100.0 fL   MCH 33.0 26.0 - 34.0 pg   MCHC 31.7 30.0 - 36.0 g/dL   RDW 75.9 16.3 - 84.6 %   Platelets 563 (H) 150 - 400 K/uL   nRBC 0.0 0.0 - 0.2 %    Comment: Performed at [facility], [address], [place], [place] [address]  Differential     Status: Abnormal   Collection Time: 03/13/20  9:16 AM  Result Value Ref Range   Neutrophils Relative % 87 %   Neutro Abs 9.7 (H) 1.7 - 7.7 K/uL   Lymphocytes Relative 4 %   Lymphs Abs 0.5 (L) 0.7 - 4.0 K/uL   Monocytes Relative 9 %   Monocytes Absolute 1.0 0.1 - 1.0 K/uL   Eosinophils Relative 0 %   Eosinophils Absolute 0.0 0.0 - 0.5 K/uL   Basophils Relative 0 %   Basophils Absolute 0.0 0.0 - 0.1 K/uL   Immature Granulocytes 0 %   Abs Immature Granulocytes 0.05 0.00 - 0.07 K/uL    Comment: Performed at [facility], [address], [place], [place] [address]  Type and screen     Status: None (Preliminary result)   Collection Time: 03/13/20  9:43 AM  Result Value Ref Range   ABO/RH(D) A POS    Antibody Screen PENDING    Sample Expiration      03/16/2020,2359 Performed at [facility], [address] [place], [place] [address]    Personally reviewed- dilated loops small bowel and transition in the right abdomen to decompressed with some thickening distal, distal ileum decompressed, stomach very distended and full  CT ABDOMEN PELVIS W CONTRAST  Result Date: 03/13/2020 CLINICAL DATA:  Diffuse abdominal pain and vomiting/hematemesis EXAM: CT ABDOMEN AND PELVIS WITH CONTRAST TECHNIQUE: Multidetector CT imaging of the abdomen and pelvis was performed using the standard protocol following bolus administration of intravenous contrast. CONTRAST:  22mL OMNIPAQUE IOHEXOL 300 MG/ML  SOLN COMPARISON:  February 28, 2020 FINDINGS: Lower chest: There are pleural effusions bilaterally with bibasilar atelectasis/consolidation. Scattered foci of coronary artery calcification noted. Hepatobiliary: There is evident hepatic steatosis. No focal liver lesions are appreciable. No appreciable biliary dilatation. Pancreas: No pancreatic mass or inflammatory focus. Spleen: No splenic lesions are evident. Adrenals/Urinary Tract: Adrenals bilaterally appear normal. There is a cyst in the mid right kidney posteriorly measuring 0.8 x 0.8 cm. No evident hydronephrosis on either side. There is no renal or ureteral calculus on either side. Stomach/Bowel: There is distension of the stomach with fluid and to a lesser extent air. There is severe dilatation of small bowel to the level of a transition zone in the right upper pelvis, at the proximal to mid ileal level. More distally in the ileum, there are loops of thickened bowel wall. The terminal ileum appears essentially unremarkable. There is no free air or portal venous air. There are colonic diverticula without  diverticulitis. There is no appreciable colonic dilatation or colonic wall thickening evident. Vascular/Lymphatic: There is no abdominal aortic aneurysm. There is aortic and iliac artery atherosclerosis as well as several foci  of pelvic arterial vascular calcification. Major venous structures appear patent. No adenopathy appreciable in the abdomen or pelvis. Reproductive: Uterus absent. No adnexal masses. A small amount of fluid is noted in the rightward aspect of the dependent portion of the pelvis. Other: Appendix not seen. Surgical clip in expected periappendiceal region area. No inflammation in this area. No abscess evident. No ascites appreciable beyond the mild fluid in the rightward aspect of the dependent portion of the pelvis. Musculoskeletal: There is degenerative change in the lower thoracic and lumbar regions. Spinal stenosis is evident at L3-4 due to disc protrusion and bony hypertrophy. Spinal stenosis also noted at L4-5 and L5-S1 due to similar factors. No blastic or lytic bone lesions. There is degenerative change in each hip joint with subchondral cysts in each hip joint, more severe on the right than on the left. No intramuscular or abdominal wall lesions. IMPRESSION: 1. Bowel obstruction with transition zone in the proximal to mid ileal region. Wall thickening and several loops of more distal ileum which may represent underlying enteritis. Colonic diverticula noted without colonic diverticulitis. No colonic dilatation. Stomach severely distended with fluid and to a lesser extent air. No free air evident. 2. No abscess evident in the abdomen or pelvis. Mild ascites in the dependent portion the pelvis toward the right may be of reactive response to the degree of bowel inflammation and bowel obstruction. 3. Pleural effusions bilaterally with atelectasis and probable superimposed infiltrate in the lung bases. 4. Aortic Atherosclerosis (ICD10-I70.0). Atherosclerotic plaque in multiple pelvic arterial  vessels as well as in the foci of coronary artery calcification. 5. Spinal stenosis at L3-4, L4-5, and L5-S1 due to combination of disc protrusion and bony hypertrophy. 6.  Hepatic steatosis. Electronically Signed   By: Lowella Grip III M.D.   On: 03/13/2020 11:08     Assessment & Plan:  MILCAH DULANY is a 75 y.o. female with a recurrent SBO that returns to the hospital. During the last hospitalization her abdomen was benign and she started to have function. She also had complications of PNA and some withdrawal requiring precedex.  Due to the recurrence and transition, I think we are going to have to explore as I think the thickening is likely reactive in nature.    -Discussed with her daughter Glenard Haring and the patient. -Will need to hold Eliquis, last dose Sunday  -Will need NPO and NG to decompression -Plan for OR for Exploratory laparotomy on Wednesday and possible SBR -ECHO 01/2020 with normal function and EF   All questions were answered to the satisfaction of the patient and family.  Discussed with Dr. Manuella Ghazi.    Virl Cagey 03/13/2020, 12:29 PM

## 2020-03-14 ENCOUNTER — Inpatient Hospital Stay (HOSPITAL_COMMUNITY): Payer: Medicare Other

## 2020-03-14 DIAGNOSIS — K56609 Unspecified intestinal obstruction, unspecified as to partial versus complete obstruction: Secondary | ICD-10-CM | POA: Diagnosis not present

## 2020-03-14 LAB — COMPREHENSIVE METABOLIC PANEL
ALT: 19 U/L (ref 0–44)
AST: 17 U/L (ref 15–41)
Albumin: 2.4 g/dL — ABNORMAL LOW (ref 3.5–5.0)
Alkaline Phosphatase: 71 U/L (ref 38–126)
Anion gap: 10 (ref 5–15)
BUN: 8 mg/dL (ref 8–23)
CO2: 24 mmol/L (ref 22–32)
Calcium: 8.3 mg/dL — ABNORMAL LOW (ref 8.9–10.3)
Chloride: 104 mmol/L (ref 98–111)
Creatinine, Ser: 0.46 mg/dL (ref 0.44–1.00)
GFR, Estimated: >60 mL/min (ref >60)
Glucose, Bld: 96 mg/dL (ref 70–99)
Potassium: 3 mmol/L — ABNORMAL LOW (ref 3.5–5.1)
Sodium: 138 mmol/L (ref 135–145)
Total Bilirubin: 0.6 mg/dL (ref 0.3–1.2)
Total Protein: 5.3 g/dL — ABNORMAL LOW (ref 6.5–8.1)

## 2020-03-14 LAB — MAGNESIUM: Magnesium: 1.7 mg/dL (ref 1.7–2.4)

## 2020-03-14 LAB — CBC
HCT: 30.7 % — ABNORMAL LOW (ref 36.0–46.0)
Hemoglobin: 10.2 g/dL — ABNORMAL LOW (ref 12.0–15.0)
MCH: 33.7 pg (ref 26.0–34.0)
MCHC: 33.2 g/dL (ref 30.0–36.0)
MCV: 101.3 fL — ABNORMAL HIGH (ref 80.0–100.0)
Platelets: 537 K/uL — ABNORMAL HIGH (ref 150–400)
RBC: 3.03 MIL/uL — ABNORMAL LOW (ref 3.87–5.11)
RDW: 13 % (ref 11.5–15.5)
WBC: 6.1 K/uL (ref 4.0–10.5)
nRBC: 0 % (ref 0.0–0.2)

## 2020-03-14 LAB — TYPE AND SCREEN
ABO/RH(D): A POS
Antibody Screen: NEGATIVE

## 2020-03-14 LAB — GLUCOSE, CAPILLARY: Glucose-Capillary: 79 mg/dL (ref 70–99)

## 2020-03-14 LAB — SARS CORONAVIRUS 2 (TAT 6-24 HRS): SARS Coronavirus 2: NEGATIVE

## 2020-03-14 MED ORDER — POTASSIUM CHLORIDE 10 MEQ/100ML IV SOLN
INTRAVENOUS | Status: AC
  Administered 2020-03-14: 10 meq via INTRAVENOUS
  Filled 2020-03-14: qty 100

## 2020-03-14 MED ORDER — MUPIROCIN 2 % EX OINT
1.0000 "application " | TOPICAL_OINTMENT | Freq: Two times a day (BID) | CUTANEOUS | Status: DC
  Administered 2020-03-14 – 2020-03-17 (×5): 1 via NASAL
  Filled 2020-03-14 (×3): qty 22

## 2020-03-14 MED ORDER — CHLORHEXIDINE GLUCONATE CLOTH 2 % EX PADS
6.0000 | MEDICATED_PAD | Freq: Once | CUTANEOUS | Status: AC
  Administered 2020-03-14: 6 via TOPICAL

## 2020-03-14 MED ORDER — POTASSIUM CHLORIDE 10 MEQ/100ML IV SOLN
10.0000 meq | INTRAVENOUS | Status: AC
  Administered 2020-03-14 (×4): 10 meq via INTRAVENOUS
  Filled 2020-03-14 (×4): qty 100

## 2020-03-14 MED ORDER — SODIUM CHLORIDE 0.9 % IV SOLN: 2.0000 g | INTRAVENOUS | Status: AC

## 2020-03-14 MED ORDER — MAGNESIUM SULFATE 2 GM/50ML IV SOLN
2.0000 g | Freq: Once | INTRAVENOUS | Status: AC
  Administered 2020-03-14: 2 g via INTRAVENOUS
  Filled 2020-03-14: qty 50

## 2020-03-14 MED ORDER — DIATRIZOATE MEGLUMINE & SODIUM 66-10 % PO SOLN
ORAL | Status: AC
  Filled 2020-03-14: qty 90

## 2020-03-14 MED ORDER — DIATRIZOATE MEGLUMINE & SODIUM 66-10 % PO SOLN
90.0000 mL | Freq: Once | ORAL | Status: AC
  Administered 2020-03-14: 90 mL via NASOGASTRIC

## 2020-03-14 NOTE — TOC Initial Note (Signed)
Transition of Care [facility]) - Initial/Assessment Note    Patient Details  Name: Samantha Huerta MRN: [id number] Date of Birth: 03-11-1946  Transition [facility]) CM/SW Contact:    Karn Cassis, LCSW Phone Number: 03/14/2020, 10:37 AM  Clinical Narrative:  Pt admitted due to small bowel obstruction. Pt recently d/c from hospital to [facility] for rehab. LCSW also received consult for substance abuse counseling. This was completed on 12/21 and pt was not interested in resources. Per Eunice Blase at [facility], pt okay to return. Will need new authorization. MD notified for PT evaluation when appropriate. Pt indicates she will have surgery tomorrow. She agrees she will need to return to rehab at d/c and said her daughter could be contacted after surgery. TOC will continue to follow.                  Expected Discharge Plan: Skilled Nursing Facility Barriers to Discharge: Continued Medical Work up   Patient Goals and CMS Choice Patient states their goals for this hospitalization and ongoing recovery are:: return to SNF   Choice offered to / list presented to : Patient  Expected Discharge Plan and Services Expected Discharge Plan: Skilled Nursing Facility In-house Referral: Clinical Social Work   Post Acute Care Choice: Skilled Nursing Facility Living arrangements for the past 2 months: Single Family Home,Skilled Nursing Facility                 DME Arranged: N/A DME Agency: NA       HH Arranged: NA HH Agency: NA        Prior Living Arrangements/Services Living arrangements for the past 2 months: Single Family Home,Skilled Nursing Facility Lives with:: Facility Resident Patient language and need for interpreter reviewed:: Yes Do you feel safe going back to the place where you live?: Yes      Need for Family Participation in Patient Care: Yes (Comment) Care giver support system in place?: Yes (comment)   Criminal Activity/Legal Involvement Pertinent to Current  Situation/Hospitalization: No - Comment as needed  Activities of Daily Living Home Assistive Devices/Equipment: Dentures (specify type),Bedside commode/3-in-1,Shower chair with back,Walker (specify type) ADL Screening (condition at time of admission) Patient's cognitive ability adequate to safely complete daily activities?: Yes Is the patient deaf or have difficulty hearing?: No Does the patient have difficulty seeing, even when wearing glasses/contacts?: No Does the patient have difficulty concentrating, remembering, or making decisions?: No Patient able to express need for assistance with ADLs?: Yes Does the patient have difficulty dressing or bathing?: No Independently performs ADLs?: Yes (appropriate for developmental age) Does the patient have difficulty walking or climbing stairs?: Yes Weakness of Legs: Both Weakness of Arms/Hands: None  Permission Sought/Granted      Share Information with NAME: [name]     Permission granted to share info w Relationship: daughter     Emotional Assessment   Attitude/Demeanor/Rapport: Engaged Affect (typically observed): Accepting Orientation: : Oriented to Self,Oriented to Place,Oriented to  Time,Oriented to Situation Alcohol / Substance Use: Not Applicable Psych Involvement: No (comment)  Admission diagnosis:  Small bowel obstruction (HCC) [K56.609] Patient Active Problem List   Diagnosis Date Noted  . Small bowel obstruction (HCC) 03/13/2020  . Protein-calorie malnutrition, severe 03/01/2020  . SBO (small bowel obstruction) (HCC) 02/28/2020  . AKI (acute kidney injury) (HCC)   . Hypotension due to hypovolemia   . Opiate dependence (HCC) 12/02/2019  . Controlled substance agreement signed 12/02/2019  . Vitamin D deficiency 03/25/2019  . Chronic midline  low back pain without sciatica 12/25/2018  . Heme positive stool 07/31/2017  . Lung nodule 04/03/2017  . Pain management contract signed 01/05/2016  . Chronic back pain 05/12/2013   . Hypertension 07/27/2012  . Hyperlipidemia 07/27/2012  . GERD (gastroesophageal reflux disease) 07/27/2012  . COPD (chronic obstructive pulmonary disease) with chronic bronchitis (HCC) 07/27/2012  . Glaucoma 07/27/2012   PCP:  [name], FNP Pharmacy:   [facility] [place], [place] - [address] [place] [address] Phone: [phone] Fax: [phone]     Social Determinants of Health (SDOH) Interventions    Readmission Risk Interventions Readmission Risk Prevention Plan 03/08/2020 02/29/2020  Transportation Screening - Complete  HRI or Home Care Consult - Complete  Social Work Consult for Recovery Care Planning/Counseling - Complete  Palliative Care Screening Complete Not Applicable  Medication Review Oceanographer) - Complete  Some recent data might be hidden

## 2020-03-14 NOTE — Progress Notes (Signed)
Pt has had large liquid BM with pieces of formed stool. States abd feels better. NGT back to wall suction with no output since instillation of GI contrast and 1 hour dwell time. Potassium riders IV continue at slower rate due to pt's intolerance and c/o pain at prescribed rate. No c/o nausea/vomiting or pain at present.

## 2020-03-14 NOTE — Progress Notes (Signed)
[facility] Progress Note     Subjective: NG output high yesterday but now looks like spit. Abd soft and no pain. No flatus or BM.   Objective: Vital signs in last 24 hours: Temp:  [97.6 F (36.4 C)-98.2 F (36.8 C)] 97.6 F (36.4 C) (01/04 1321) Pulse Rate:  [72-96] 72 (01/04 1321) Resp:  [16-20] 20 (01/04 1321) BP: (117-126)/(60-77) 122/67 (01/04 1321) SpO2:  [92 %-100 %] 100 % (01/04 1321) Weight:  [44.4 kg] 44.4 kg (01/03 1530) Last BM Date: 03/12/20  Intake/Output from previous day: 01/03 0701 - 01/04 0700 In: 1050 [I.V.:750; IV Piggyback:300] Out: 2240 [Urine:240; Emesis/NG output:2000] Intake/Output this shift: No intake/output data recorded.  General appearance: alert, cooperative and no distress Resp: coughing GI: soft, minimally distended, nontender  Lab Results:  Recent Labs    03/13/20 0916 03/14/20 0505  WBC 11.6* 6.1  HGB 11.6* 10.2*  HCT 36.6 30.7*  PLT 563* 537*   BMET Recent Labs    03/13/20 0916 03/14/20 0505  NA 138 138  K 3.0* 3.0*  CL 100 104  CO2 23 24  GLUCOSE 65* 96  BUN 10 8  CREATININE 0.63 0.46  CALCIUM 8.9 8.3*   PT/INR No results for input(s): LABPROT, INR in the last 72 hours.  Studies/Results: CT ABDOMEN PELVIS W CONTRAST  Result Date: 03/13/2020 CLINICAL DATA:  Diffuse abdominal pain and vomiting/hematemesis EXAM: CT ABDOMEN AND PELVIS WITH CONTRAST TECHNIQUE: Multidetector CT imaging of the abdomen and pelvis was performed using the standard protocol following bolus administration of intravenous contrast. CONTRAST:  4mL OMNIPAQUE IOHEXOL 300 MG/ML  SOLN COMPARISON:  February 28, 2020 FINDINGS: Lower chest: There are pleural effusions bilaterally with bibasilar atelectasis/consolidation. Scattered foci of coronary artery calcification noted. Hepatobiliary: There is evident hepatic steatosis. No focal liver lesions are appreciable. No appreciable biliary dilatation. Pancreas: No pancreatic mass or  inflammatory focus. Spleen: No splenic lesions are evident. Adrenals/Urinary Tract: Adrenals bilaterally appear normal. There is a cyst in the mid right kidney posteriorly measuring 0.8 x 0.8 cm. No evident hydronephrosis on either side. There is no renal or ureteral calculus on either side. Stomach/Bowel: There is distension of the stomach with fluid and to a lesser extent air. There is severe dilatation of small bowel to the level of a transition zone in the right upper pelvis, at the proximal to mid ileal level. More distally in the ileum, there are loops of thickened bowel wall. The terminal ileum appears essentially unremarkable. There is no free air or portal venous air. There are colonic diverticula without diverticulitis. There is no appreciable colonic dilatation or colonic wall thickening evident. Vascular/Lymphatic: There is no abdominal aortic aneurysm. There is aortic and iliac artery atherosclerosis as well as several foci of pelvic arterial vascular calcification. Major venous structures appear patent. No adenopathy appreciable in the abdomen or pelvis. Reproductive: Uterus absent. No adnexal masses. A small amount of fluid is noted in the rightward aspect of the dependent portion of the pelvis. Other: Appendix not seen. Surgical clip in expected periappendiceal region area. No inflammation in this area. No abscess evident. No ascites appreciable beyond the mild fluid in the rightward aspect of the dependent portion of the pelvis. Musculoskeletal: There is degenerative change in the lower thoracic and lumbar regions. Spinal stenosis is evident at L3-4 due to disc protrusion and bony hypertrophy. Spinal stenosis also noted at L4-5 and L5-S1 due to similar factors. No blastic or lytic bone lesions. There is degenerative change in each hip joint  with subchondral cysts in each hip joint, more severe on the right than on the left. No intramuscular or abdominal wall lesions. IMPRESSION: 1. Bowel obstruction  with transition zone in the proximal to mid ileal region. Wall thickening and several loops of more distal ileum which may represent underlying enteritis. Colonic diverticula noted without colonic diverticulitis. No colonic dilatation. Stomach severely distended with fluid and to a lesser extent air. No free air evident. 2. No abscess evident in the abdomen or pelvis. Mild ascites in the dependent portion the pelvis toward the right may be of reactive response to the degree of bowel inflammation and bowel obstruction. 3. Pleural effusions bilaterally with atelectasis and probable superimposed infiltrate in the lung bases. 4. Aortic Atherosclerosis (ICD10-I70.0). Atherosclerotic plaque in multiple pelvic arterial vessels as well as in the foci of coronary artery calcification. 5. Spinal stenosis at L3-4, L4-5, and L5-S1 due to combination of disc protrusion and bony hypertrophy. 6.  Hepatic steatosis. Electronically Signed   By: Bretta Bang III M.D.   On: 03/13/2020 11:08   DG Chest Portable 1 View  Result Date: 03/13/2020 CLINICAL DATA:  Nasogastric placement EXAM: PORTABLE CHEST 1 VIEW COMPARISON:  02/28/2020 FINDINGS: Nasogastric tube enters the stomach with its tip in the mid body. There is mild to moderate atelectasis or patchy infiltrate in the left lower lobe. Minimal atelectasis at the right base. No evidence of heart failure or effusion. IMPRESSION: 1. Nasogastric tube tip in the mid body of the stomach. 2. Atelectasis or patchy infiltrate in the left lower lobe. Electronically Signed   By: Paulina Fusi M.D.   On: 03/13/2020 13:44    Anti-infectives: Anti-infectives (From admission, onward)   Start     Dose/Rate Route Frequency Ordered Stop   03/15/20 0600  cefoTEtan (CEFOTAN) 2 g in sodium chloride 0.9 % 100 mL IVPB        2 g 200 mL/hr over 30 Minutes Intravenous On call to O.R. 03/14/20 1303 03/16/20 0559      Assessment/Plan: Samantha Huerta is a 75 yo who has a recurrent SBO. She is  again improving. Discussed SBFT with her and daughter Samantha Huerta. Will try that today and see if this has resolved itself. -If no resolution by AM plan for OR tomorrow -Discussed risk of bleeding, infection, need for resection, causing more scar, finding something unexpected like cancer   -They agree to trying SBFT before committing to surgery -SBFT ordered  Updated Dr. Sherryll Burger and RN.    LOS: 1 day    Lucretia Roers 03/14/2020

## 2020-03-14 NOTE — Progress Notes (Signed)
PROGRESS NOTE    Samantha Huerta  ZJQ:[id number] DOB: 1946/01/31 DOA: 03/13/2020 PCP: [name], FNP   Brief Narrative:  Samantha Huerta is a 75 y.o. female with medical history significant for COPD, hypertension, dyslipidemia, recent onset atrial fibrillation, alcohol use disorder with associated possible dementia, and recent admission for small bowel obstruction he was discharged on 12/31 after conservative management.  Patient presented back with worsening nausea and vomiting and noted recurrence of small bowel obstruction.  She is currently with NG tube to low intermittent suction and on IV fluid.  General surgery planning for exploratory laparotomy on 1/5.  Assessment & Plan:   Active Problems:   Small bowel obstruction (HCC)  Small bowel obstruction with intractable nausea and vomiting/hematemesis -Appreciate general surgery consultation with plan for likely ex lap by 1/5, hold Eliquis for now -Recent discharge on 12/31 with conservative management -NG tube to low intermittent suction keep n.p.o. and on IV fluid -Replete potassium and magnesium as noted below -Maintain on PPI IV twice daily and monitor hemoglobin trend -Transfuse for hemoglobin less than 7 -Hematemesis likely related to Mallory-Weiss tear in the setting of nausea and vomiting as well as Eliquis use, consider GI consultation if continues to persist  Recent aspiration pneumonia secondary to above -No symptoms of shortness of breath and cough noted -Treated with Unasyn and Augmentin at previous admission -Continue to monitor  Hypokalemia -Replete and reevaluate in a.m. -Supplement magnesium -Goal potassium of 4 and magnesium 2 -Monitor on telemetry  Alcoholic dementia/recurrent alcohol use -Noted to have severe alcohol withdrawal delirium during prior admission requiring Precedex infusion -No signs of withdrawal currently noted -Monitor closely with CIWA protocol -Last alcoholic drink was over 6  weeks ago according to patient  Recent new onset atrial fibrillation with RVR-currently in sinus rhythm -2D echocardiogram within normal limits and TSH within normal limits recently -Hold oral Cardizem and maintain on IV metoprolol every 6 hours and monitor on telemetry -Holding apixaban given hematemesis  History of COPD -No active flare noted -Continue home medications with DuoNebs as needed   DVT prophylaxis: SCDs Code Status: Full Family Communication: Discussed with daughter 48/4 Disposition Plan:  Status is: Inpatient  Remains inpatient appropriate because:IV treatments appropriate due to intensity of illness or inability to take PO and Inpatient level of care appropriate due to severity of illness   Dispo: The patient is from: Home              Anticipated d/c is to: Home              Anticipated d/c date is: 3 days              Patient currently is not medically stable to d/c.  Patient will require exploratory laparotomy which will be scheduled for 1/5.   Consultants:   General Surgery  Procedures:   See below  Antimicrobials:   None   Subjective: Patient seen and evaluated today with no new acute complaints or concerns. No acute concerns or events noted overnight.  Patient states she does not have any further nausea or vomiting and abdominal pain has improved.  She denies any flatus or bowel movement.  Objective: Vitals:   03/13/20 2139 03/13/20 2343 03/14/20 0500 03/14/20 0958  BP: 126/61 122/60 121/77   Pulse: 84 77 90   Resp: 17 18 19    Temp: 98.1 F (36.7 C) 98.2 F (36.8 C) 97.8 F (36.6 C)   TempSrc:   Oral   SpO2:  94% 95% 95% 94%  Weight:      Height:        Intake/Output Summary (Last 24 hours) at 03/14/2020 1100 Last data filed at 03/14/2020 0700 Gross per 24 hour  Intake 1050 ml  Output 2240 ml  Net -1190 ml   Filed Weights   03/13/20 0856 03/13/20 1530  Weight: 56 kg 44.4 kg    Examination:  General exam: Appears calm and  comfortable  Respiratory system: Clear to auscultation. Respiratory effort normal. Cardiovascular system: S1 & S2 heard, RRR.  Gastrointestinal system: Abdomen is soft, NG tube to low intermittent suction Central nervous system: Alert and awake Extremities: No edema Skin: No significant lesions noted Psychiatry: Flat affect.    Data Reviewed: I have personally reviewed following labs and imaging studies  CBC: Recent Labs  Lab 03/08/20 0539 03/09/20 0504 03/10/20 0514 03/13/20 0916 03/14/20 0505  WBC 17.8* 14.3* 9.8 11.6* 6.1  NEUTROABS  --   --   --  9.7*  --   HGB 11.9* 10.7* 11.2* 11.6* 10.2*  HCT 36.5 32.3* 33.3* 36.6 30.7*  MCV 101.4* 100.3* 99.4 104.0* 101.3*  PLT 406* 425* 517* 563* 537*   Basic Metabolic Panel: Recent Labs  Lab 03/07/20 1713 03/08/20 0539 03/09/20 0504 03/10/20 0514 03/10/20 1057 03/13/20 0916 03/14/20 0505  NA 137 134* 134* 133*  --  138 138  K 3.7 4.1 3.8 2.8* 2.9* 3.0* 3.0*  CL 108 105 104 102  --  100 104  CO2 20* 19* 19* 21*  --  23 24  GLUCOSE 145* 114* 87 98  --  65* 96  BUN 23 23 14 12   --  10 8  CREATININE 0.49 0.52 0.46 0.52  --  0.63 0.46  CALCIUM 7.9* 8.3* 8.4* 8.5*  --  8.9 8.3*  MG 1.6*  --  1.4* 1.8  --  1.6* 1.7  PHOS  --  3.3 3.1  --   --   --   --    GFR: Estimated Creatinine Clearance: 42.6 mL/min (by C-G formula based on SCr of 0.46 mg/dL). Liver Function Tests: Recent Labs  Lab 03/08/20 0539 03/09/20 0504 03/13/20 0916 03/14/20 0505  AST  --  22 24 17   ALT  --  31 27 19   ALKPHOS  --  89 93 71  BILITOT  --  0.5 0.7 0.6  PROT  --  5.3* 6.2* 5.3*  ALBUMIN 2.2* 2.2* 3.0* 2.4*   Recent Labs  Lab 03/13/20 0916  LIPASE 34   No results for input(s): AMMONIA in the last 168 hours. Coagulation Profile: No results for input(s): INR, PROTIME in the last 168 hours. Cardiac Enzymes: No results for input(s): CKTOTAL, CKMB, CKMBINDEX, TROPONINI in the last 168 hours. BNP (last 3 results) No results for input(s):  PROBNP in the last 8760 hours. HbA1C: No results for input(s): HGBA1C in the last 72 hours. CBG: Recent Labs  Lab 03/09/20 0005 03/09/20 0454 03/09/20 1110 03/09/20 1608 03/13/20 1229  GLUCAP 81 88 81 75 92   Lipid Profile: No results for input(s): CHOL, HDL, LDLCALC, TRIG, CHOLHDL, LDLDIRECT in the last 72 hours. Thyroid Function Tests: No results for input(s): TSH, T4TOTAL, FREET4, T3FREE, THYROIDAB in the last 72 hours. Anemia Panel: No results for input(s): VITAMINB12, FOLATE, FERRITIN, TIBC, IRON, RETICCTPCT in the last 72 hours. Sepsis Labs: No results for input(s): PROCALCITON, LATICACIDVEN in the last 168 hours.  Recent Results (from the past 240 hour(s))  Resp Panel by RT-PCR (Flu  A&B, Covid) Nasopharyngeal Swab     Status: None   Collection Time: 03/09/20 12:23 PM   Specimen: Nasopharyngeal Swab; Nasopharyngeal(NP) swabs in vial transport medium  Result Value Ref Range Status   SARS Coronavirus 2 by RT PCR NEGATIVE NEGATIVE Final    Comment: (NOTE) SARS-CoV-2 target nucleic acids are NOT DETECTED.  The SARS-CoV-2 RNA is generally detectable in upper respiratory specimens during the acute phase of infection. The lowest concentration of SARS-CoV-2 viral copies this assay can detect is 138 copies/mL. A negative result does not preclude SARS-Cov-2 infection and should not be used as the sole basis for treatment or other patient management decisions. A negative result may occur with  improper specimen collection/handling, submission of specimen other than nasopharyngeal swab, presence of viral mutation(s) within the areas targeted by this assay, and inadequate number of viral copies(<138 copies/mL). A negative result must be combined with clinical observations, patient history, and epidemiological information. The expected result is Negative.  Fact Sheet for Patients:  BloggerCourse.com  Fact Sheet for Healthcare Providers:   SeriousBroker.it  This test is no t yet approved or cleared by the [place] FDA and  has been authorized for detection and/or diagnosis of SARS-CoV-2 by FDA under an Emergency Use Authorization (EUA). This EUA will remain  in effect (meaning this test can be used) for the duration of the COVID-19 declaration under Section 564(b)(1) of the Act, 21 U.S.C.section 360bbb-3(b)(1), unless the authorization is terminated  or revoked sooner.       Influenza A by PCR NEGATIVE NEGATIVE Final   Influenza B by PCR NEGATIVE NEGATIVE Final    Comment: (NOTE) The Xpert Xpress SARS-CoV-2/FLU/RSV plus assay is intended as an aid in the diagnosis of influenza from Nasopharyngeal swab specimens and should not be used as a sole basis for treatment. Nasal washings and aspirates are unacceptable for Xpert Xpress SARS-CoV-2/FLU/RSV testing.  Fact Sheet for Patients: BloggerCourse.com  Fact Sheet for Healthcare Providers: SeriousBroker.it  This test is not yet approved or cleared by the [place] FDA and has been authorized for detection and/or diagnosis of SARS-CoV-2 by FDA under an Emergency Use Authorization (EUA). This EUA will remain in effect (meaning this test can be used) for the duration of the COVID-19 declaration under Section 564(b)(1) of the Act, 21 U.S.C. section 360bbb-3(b)(1), unless the authorization is terminated or revoked.  Performed at [facility], [address], [place], [place] [address]          Radiology Studies: CT ABDOMEN PELVIS W CONTRAST  Result Date: 03/13/2020 CLINICAL DATA:  Diffuse abdominal pain and vomiting/hematemesis EXAM: CT ABDOMEN AND PELVIS WITH CONTRAST TECHNIQUE: Multidetector CT imaging of the abdomen and pelvis was performed using the standard protocol following bolus administration of intravenous contrast. CONTRAST:  33mL OMNIPAQUE IOHEXOL 300 MG/ML  SOLN  COMPARISON:  February 28, 2020 FINDINGS: Lower chest: There are pleural effusions bilaterally with bibasilar atelectasis/consolidation. Scattered foci of coronary artery calcification noted. Hepatobiliary: There is evident hepatic steatosis. No focal liver lesions are appreciable. No appreciable biliary dilatation. Pancreas: No pancreatic mass or inflammatory focus. Spleen: No splenic lesions are evident. Adrenals/Urinary Tract: Adrenals bilaterally appear normal. There is a cyst in the mid right kidney posteriorly measuring 0.8 x 0.8 cm. No evident hydronephrosis on either side. There is no renal or ureteral calculus on either side. Stomach/Bowel: There is distension of the stomach with fluid and to a lesser extent air. There is severe dilatation of small bowel to the level of a transition zone  in the right upper pelvis, at the proximal to mid ileal level. More distally in the ileum, there are loops of thickened bowel wall. The terminal ileum appears essentially unremarkable. There is no free air or portal venous air. There are colonic diverticula without diverticulitis. There is no appreciable colonic dilatation or colonic wall thickening evident. Vascular/Lymphatic: There is no abdominal aortic aneurysm. There is aortic and iliac artery atherosclerosis as well as several foci of pelvic arterial vascular calcification. Major venous structures appear patent. No adenopathy appreciable in the abdomen or pelvis. Reproductive: Uterus absent. No adnexal masses. A small amount of fluid is noted in the rightward aspect of the dependent portion of the pelvis. Other: Appendix not seen. Surgical clip in expected periappendiceal region area. No inflammation in this area. No abscess evident. No ascites appreciable beyond the mild fluid in the rightward aspect of the dependent portion of the pelvis. Musculoskeletal: There is degenerative change in the lower thoracic and lumbar regions. Spinal stenosis is evident at L3-4 due to  disc protrusion and bony hypertrophy. Spinal stenosis also noted at L4-5 and L5-S1 due to similar factors. No blastic or lytic bone lesions. There is degenerative change in each hip joint with subchondral cysts in each hip joint, more severe on the right than on the left. No intramuscular or abdominal wall lesions. IMPRESSION: 1. Bowel obstruction with transition zone in the proximal to mid ileal region. Wall thickening and several loops of more distal ileum which may represent underlying enteritis. Colonic diverticula noted without colonic diverticulitis. No colonic dilatation. Stomach severely distended with fluid and to a lesser extent air. No free air evident. 2. No abscess evident in the abdomen or pelvis. Mild ascites in the dependent portion the pelvis toward the right may be of reactive response to the degree of bowel inflammation and bowel obstruction. 3. Pleural effusions bilaterally with atelectasis and probable superimposed infiltrate in the lung bases. 4. Aortic Atherosclerosis (ICD10-I70.0). Atherosclerotic plaque in multiple pelvic arterial vessels as well as in the foci of coronary artery calcification. 5. Spinal stenosis at L3-4, L4-5, and L5-S1 due to combination of disc protrusion and bony hypertrophy. 6.  Hepatic steatosis. Electronically Signed   By: Bretta Bang III M.D.   On: 03/13/2020 11:08   DG Chest Portable 1 View  Result Date: 03/13/2020 CLINICAL DATA:  Nasogastric placement EXAM: PORTABLE CHEST 1 VIEW COMPARISON:  02/28/2020 FINDINGS: Nasogastric tube enters the stomach with its tip in the mid body. There is mild to moderate atelectasis or patchy infiltrate in the left lower lobe. Minimal atelectasis at the right base. No evidence of heart failure or effusion. IMPRESSION: 1. Nasogastric tube tip in the mid body of the stomach. 2. Atelectasis or patchy infiltrate in the left lower lobe. Electronically Signed   By: Paulina Fusi M.D.   On: 03/13/2020 13:44        Scheduled  Meds: . brinzolamide  1 drop Both Eyes BID  . latanoprost  1 drop Both Eyes QHS  . metoprolol tartrate  2.5 mg Intravenous Q6H  . mometasone-formoterol  2 puff Inhalation BID  . pantoprazole (PROTONIX) IV  40 mg Intravenous Q12H  . thiamine  100 mg Oral Daily   Or  . thiamine  100 mg Intravenous Daily   Continuous Infusions: . dextrose 5% lactated ringers 75 mL/hr at 03/14/20 1004  . magnesium sulfate bolus IVPB 2 g (03/14/20 1009)  . potassium chloride 10 mEq (03/14/20 1016)     LOS: 1 day  Time spent: 35 minutes    Serita Degroote Darleen Crocker, DO [facility]  If 7PM-7AM, please contact night-coverage [email or web address] 03/14/2020, 11:00 AM

## 2020-03-14 NOTE — [place] FL2 (Signed)
[place] MEDICAID FL2 LEVEL OF CARE SCREENING TOOL     IDENTIFICATION  Patient Name: Samantha Huerta Birthdate: 28-Aug-1945 Sex: female Admission Date (Current Location): 03/13/2020  [facility] and [place] Number:  [employer] and Address:  [facility],  [address] [place] [address]      Provider Number: [id number]  Attending Physician Name and Address:  Erick Blinks, DO  Relative Name and Phone Number:  Candie Chroman - daughter - [phone]    Current Level of Care: Hospital Recommended Level of Care: Skilled Nursing Facility Prior Approval Number:    Date Approved/Denied:   PASRR Number:    Discharge Plan: SNF    Current Diagnoses: Patient Active Problem List   Diagnosis Date Noted  . Small bowel obstruction (HCC) 03/13/2020  . Protein-calorie malnutrition, severe 03/01/2020  . SBO (small bowel obstruction) (HCC) 02/28/2020  . AKI (acute kidney injury) (HCC)   . Hypotension due to hypovolemia   . Opiate dependence (HCC) 12/02/2019  . Controlled substance agreement signed 12/02/2019  . Vitamin D deficiency 03/25/2019  . Chronic midline low back pain without sciatica 12/25/2018  . Heme positive stool 07/31/2017  . Lung nodule 04/03/2017  . Pain management contract signed 01/05/2016  . Chronic back pain 05/12/2013  . Hypertension 07/27/2012  . Hyperlipidemia 07/27/2012  . GERD (gastroesophageal reflux disease) 07/27/2012  . COPD (chronic obstructive pulmonary disease) with chronic bronchitis (HCC) 07/27/2012  . Glaucoma 07/27/2012    Orientation RESPIRATION BLADDER Height & Weight     Self,Time,Situation,Place  Normal (Ignacio 1L) Continent Weight: 97 lb 14.2 oz (44.4 kg) Height:  5\' 5"  (165.1 cm)  BEHAVIORAL SYMPTOMS/MOOD NEUROLOGICAL BOWEL NUTRITION STATUS      Continent Diet (NPO time specified. See d/c summary for updates.)  AMBULATORY STATUS COMMUNICATION OF NEEDS Skin   Extensive Assist Verbally Bruising,Other (Comment) (Stage  1 to sacrum with foam dressing.)                       Personal Care Assistance Level of Assistance  Bathing,Feeding,Dressing Bathing Assistance: Maximum assistance Feeding assistance: Limited assistance Dressing Assistance: Maximum assistance     Functional Limitations Info  Sight,Speech,Hearing Sight Info: Adequate Hearing Info: Adequate Speech Info: Adequate    SPECIAL CARE FACTORS FREQUENCY  PT (By licensed PT)     PT Frequency: 5x weekly              Contractures Contractures Info: Not present    Additional Factors Info  Code Status,Allergies,Isolation Precautions         Isolation Precautions Info: MRSA PCR +     Current Medications (03/14/2020):  This is the current hospital active medication list Current Facility-Administered Medications  Medication Dose Route Frequency Provider Last Rate Last Admin  . acetaminophen (TYLENOL) tablet 650 mg  650 mg Oral Q6H PRN 05/12/2020, Pratik D, DO       Or  . acetaminophen (TYLENOL) suppository 650 mg  650 mg Rectal Q6H PRN Sherryll Burger, Pratik D, DO      . albuterol (VENTOLIN HFA) 108 (90 Base) MCG/ACT inhaler 2 puff  2 puff Inhalation Q4H PRN Sherryll Burger, Pratik D, DO      . brinzolamide (AZOPT) 1 % ophthalmic suspension 1 drop  1 drop Both Eyes BID Sherryll Burger, Pratik D, DO   1 drop at 03/14/20 1005  . dextrose 5 % in lactated ringers infusion   Intravenous Continuous 05/12/20 D, DO 75 mL/hr at 03/14/20 1004 New Bag  at 03/14/20 1004  . HYDROmorphone (DILAUDID) injection 0.5-1 mg  0.5-1 mg Intravenous Q2H PRN Sherryll Burger, Pratik D, DO      . latanoprost (XALATAN) 0.005 % ophthalmic solution 1 drop  1 drop Both Eyes QHS Shah, Pratik D, DO   1 drop at 03/13/20 2055  . LORazepam (ATIVAN) tablet 1-4 mg  1-4 mg Oral Q1H PRN Sherryll Burger, Pratik D, DO       Or  . LORazepam (ATIVAN) injection 1-4 mg  1-4 mg Intravenous Q1H PRN Sherryll Burger, Pratik D, DO      . magnesium sulfate IVPB 2 g 50 mL  2 g Intravenous Once Maurilio Lovely D, DO 50 mL/hr at 03/14/20 1009 2 g at  03/14/20 1009  . metoprolol tartrate (LOPRESSOR) injection 2.5 mg  2.5 mg Intravenous Q6H Shah, Pratik D, DO   2.5 mg at 03/14/20 0524  . mometasone-formoterol (DULERA) 200-5 MCG/ACT inhaler 2 puff  2 puff Inhalation BID Sherryll Burger, Pratik D, DO   2 puff at 03/14/20 0957  . ondansetron (ZOFRAN) tablet 4 mg  4 mg Oral Q6H PRN Sherryll Burger, Pratik D, DO       Or  . ondansetron (ZOFRAN) injection 4 mg  4 mg Intravenous Q6H PRN Sherryll Burger, Pratik D, DO      . pantoprazole (PROTONIX) injection 40 mg  40 mg Intravenous Q12H Shah, Pratik D, DO   40 mg at 03/14/20 1002  . phenol (CHLORASEPTIC) mouth spray 1 spray  1 spray Mouth/Throat PRN Sherryll Burger, Pratik D, DO      . potassium chloride 10 mEq in 100 mL IVPB  10 mEq Intravenous Q1 Hr x 6 Shah, Pratik D, DO 100 mL/hr at 03/14/20 1016 10 mEq at 03/14/20 1016  . thiamine tablet 100 mg  100 mg Oral Daily Sherryll Burger, Pratik D, DO       Or  . thiamine (B-1) injection 100 mg  100 mg Intravenous Daily Sherryll Burger, Pratik D, DO   100 mg at 03/14/20 1001     Discharge Medications: Please see discharge summary for a list of discharge medications.  Relevant Imaging Results:  Relevant Lab Results:   Additional Information SS#  [id number]  Karn [place], [place]

## 2020-03-15 ENCOUNTER — Inpatient Hospital Stay (HOSPITAL_COMMUNITY): Payer: Medicare Other

## 2020-03-15 ENCOUNTER — Encounter (HOSPITAL_COMMUNITY)
Admission: EM | Disposition: A | Discharge status: Skilled Nursing Facility | Payer: Self-pay | Source: Skilled Nursing Facility | Attending: Internal Medicine

## 2020-03-15 DIAGNOSIS — K56609 Unspecified intestinal obstruction, unspecified as to partial versus complete obstruction: Secondary | ICD-10-CM | POA: Diagnosis not present

## 2020-03-15 DIAGNOSIS — M79671 Pain in right foot: Secondary | ICD-10-CM

## 2020-03-15 DIAGNOSIS — Z0189 Encounter for other specified special examinations: Secondary | ICD-10-CM | POA: Diagnosis not present

## 2020-03-15 LAB — SURGICAL PCR SCREEN
MRSA, PCR: NEGATIVE
Staphylococcus aureus: NEGATIVE

## 2020-03-15 LAB — CBC
HCT: 29.8 % — ABNORMAL LOW (ref 36.0–46.0)
Hemoglobin: 9.8 g/dL — ABNORMAL LOW (ref 12.0–15.0)
MCH: 32.9 pg (ref 26.0–34.0)
MCHC: 32.9 g/dL (ref 30.0–36.0)
MCV: 100 fL (ref 80.0–100.0)
Platelets: 535 10*3/uL — ABNORMAL HIGH (ref 150–400)
RBC: 2.98 MIL/uL — ABNORMAL LOW (ref 3.87–5.11)
RDW: 12.7 % (ref 11.5–15.5)
WBC: 6.1 10*3/uL (ref 4.0–10.5)
nRBC: 0 % (ref 0.0–0.2)

## 2020-03-15 LAB — COMPREHENSIVE METABOLIC PANEL
ALT: 17 U/L (ref 0–44)
AST: 15 U/L (ref 15–41)
Albumin: 2.3 g/dL — ABNORMAL LOW (ref 3.5–5.0)
Alkaline Phosphatase: 72 U/L (ref 38–126)
Anion gap: 7 (ref 5–15)
BUN: 5 mg/dL — ABNORMAL LOW (ref 8–23)
CO2: 24 mmol/L (ref 22–32)
Calcium: 8.1 mg/dL — ABNORMAL LOW (ref 8.9–10.3)
Chloride: 104 mmol/L (ref 98–111)
Creatinine, Ser: 0.44 mg/dL (ref 0.44–1.00)
GFR, Estimated: >60 mL/min (ref >60)
Glucose, Bld: 95 mg/dL (ref 70–99)
Potassium: 2.8 mmol/L — ABNORMAL LOW (ref 3.5–5.1)
Sodium: 135 mmol/L (ref 135–145)
Total Bilirubin: 0.4 mg/dL (ref 0.3–1.2)
Total Protein: 5.1 g/dL — ABNORMAL LOW (ref 6.5–8.1)

## 2020-03-15 LAB — MAGNESIUM: Magnesium: 1.5 mg/dL — ABNORMAL LOW (ref 1.7–2.4)

## 2020-03-15 SURGERY — LAPAROTOMY, EXPLORATORY
Anesthesia: General

## 2020-03-15 MED ORDER — NICOTINE 21 MG/24HR TD PT24
21.0000 mg | MEDICATED_PATCH | Freq: Every day | TRANSDERMAL | Status: DC
  Administered 2020-03-15 – 2020-03-17 (×3): 21 mg via TRANSDERMAL
  Filled 2020-03-15 (×3): qty 1

## 2020-03-15 MED ORDER — POTASSIUM CHLORIDE 10 MEQ/100ML IV SOLN
10.0000 meq | INTRAVENOUS | Status: AC
  Administered 2020-03-15 (×6): 10 meq via INTRAVENOUS
  Filled 2020-03-15 (×6): qty 100

## 2020-03-15 MED ORDER — MAGNESIUM SULFATE 4 GM/100ML IV SOLN
4.0000 g | Freq: Once | INTRAVENOUS | Status: AC
  Administered 2020-03-15: 4 g via INTRAVENOUS
  Filled 2020-03-15: qty 100

## 2020-03-15 NOTE — Progress Notes (Signed)
Pt was able to tolerate 25% of clear liquid diet with no c/o nausea nor vomiting. Denies any discomfort r/t eating. Pt appears to be more confused than earlier, alert and oriented but sometimes rambles about unrelated conversation. C/o right foot pain, redness noted on top of foot, some swelling noted compared to left foot, pulses noted and pt able to wiggle toes. Pt states it hurts "to touch". MD notified of complaints.

## 2020-03-15 NOTE — Progress Notes (Signed)
While in pt's room -pt was on her personal phone with daughter [name] and pt's daughter insisted on speaking to me. She was inquiring about why her mom didn't get surgery and that she's upset with having a NG tube still in. I explained to pt's daughter [name] that surgery was not needed due to test completed that ruled out the need for surgery at this time and that Dr. Henreitta Leber would be available to speak with pt when she is out of her case/surgery. Pt's daughter insisted that we must have another surgeon on site who can speak to pt about not needing surgery. I also explained to pt that I contacted pt's MD here at hospital about the NG tube and I have orders to let Dr. Henreitta Leber assess pt before anything can be done in regards to NG tube. Pt's daughter stated that her mom is lying in room and not being taking care of it from the standpoint of having the NG tube still in. I explained to pt's daughter that we have been very attentive to her mothers needs and we are doing everything we can to ensure she is comfortable such as mouth swabs and moistening lips at pt's request.. Pt's daughter had no further questions at this time.

## 2020-03-15 NOTE — Progress Notes (Signed)
[facility] Progress Note     Subjective: SBFT with contrast in colon. Had multiple BMs overnight. Canceled surgery this AM.   Objective: Vital signs in last 24 hours: Temp:  [98.1 F (36.7 C)-98.4 F (36.9 C)] 98.4 F (36.9 C) (01/05 1339) Pulse Rate:  [71-80] 78 (01/05 1341) Resp:  [18] 18 (01/05 1339) BP: (125-132)/(66-75) 132/71 (01/05 1341) SpO2:  [95 %-100 %] 97 % (01/05 1339) Last BM Date: 03/15/20  Intake/Output from previous day: 01/04 0701 - 01/05 0700 In: 600 [I.V.:600] Out: 160 [Emesis/NG output:160] Intake/Output this shift: No intake/output data recorded.  General appearance: alert, cooperative and no distress Resp: coughing GI: soft, nondistended, nontender  Lab Results:  Recent Labs    03/14/20 0505 03/15/20 0513  WBC 6.1 6.1  HGB 10.2* 9.8*  HCT 30.7* 29.8*  PLT 537* 535*   BMET Recent Labs    03/14/20 0505 03/15/20 0513  NA 138 135  K 3.0* 2.8*  CL 104 104  CO2 24 24  GLUCOSE 96 95  BUN 8 5*  CREATININE 0.46 0.44  CALCIUM 8.3* 8.1*   PT/INR No results for input(s): LABPROT, INR in the last 72 hours.  Studies/Results: DG Abd Portable 1V-Small Bowel Obstruction Protocol-24 hr delay  Result Date: 03/15/2020 CLINICAL DATA:  24 hour delay study. History of small-bowel obstruction. EXAM: PORTABLE ABDOMEN - 1 VIEW COMPARISON:  03/14/2020.  CT 03/13/2020. FINDINGS: Interim removal of NG tube. No gastric distention. Surgical clips in the pelvis. Oral contrast again noted throughout the colon. Several loops of slightly dilated small bowel noted. Severe small bowel dilatation noted on prior CT of 03/13/2020 no longer identified. Colon is nondistended. No free air. Degenerative changes lumbar spine and both hips. Mild left base atelectasis. IMPRESSION: 1.  Interim removal of NG tube.  No gastric distention. 2. Oral contrast again noted in the colon. Several loops of slightly prominent small bowel noted. Previously identified severe  small-bowel distention noted on CT of 03/13/2020 has resolved. Electronically Signed   By: Maisie Fus  Register   On: 03/15/2020 14:48   DG Abd Portable 1V-Small Bowel Obstruction Protocol-initial, 8 hr delay  Result Date: 03/14/2020 CLINICAL DATA:  Small-bowel obstruction EXAM: PORTABLE ABDOMEN - 1 VIEW COMPARISON:  03/14/2020 FINDINGS: Supine frontal view of the abdomen and pelvis was obtained 8 hours after oral contrast administration. Oral contrast is seen throughout the colon to the level of the rectum. No evidence of bowel obstruction. No masses or abnormal calcifications. Enteric catheter tip and side port project over the gastric fundus. IMPRESSION: 1. Progression of oral contrast into the colon by 8 hours. No evidence of small-bowel obstruction. Electronically Signed   By: Sharlet Salina M.D.   On: 03/14/2020 22:50   DG Abd Portable 1V-Small Bowel Protocol-Position Verification  Result Date: 03/14/2020 CLINICAL DATA:  Nasogastric tube placement EXAM: PORTABLE ABDOMEN - 1 VIEW COMPARISON:  Chest x-ray obtained yesterday; prior abdominal radiograph 03/05/2020 FINDINGS: A nasogastric tube is present. The tip projects over the gastric body. The bowel gas pattern does not appear obstructed. Left-sided pleural effusion with associated left lower lobe atelectasis. No acute osseous abnormality. IMPRESSION: 1. The nasogastric tube projects over the stomach in good position. 2. Marked interval reduction in small bowel distension. 3. Small left pleural effusion and associated left basilar atelectasis. Electronically Signed   By: Malachy Moan M.D.   On: 03/14/2020 14:28    Anti-infectives: Anti-infectives (From admission, onward)   Start     Dose/Rate Route Frequency Ordered Stop  03/15/20 0600  cefoTEtan (CEFOTAN) 2 g in sodium chloride 0.9 % 100 mL IVPB        2 g 200 mL/hr over 30 Minutes Intravenous On call to O.R. 03/14/20 1303 03/16/20 0559      Assessment/Plan: Samantha Huerta is a 75 yo with a  SBO that is now resolved after SBFT. Contrast into colon and BMs overnight.  NG out and diet adv as tolerated Left [name] a message. Dr. Kerry Hough also spoke with her today Will hopefully be able to avoid surgery, but if recurs again will have to proceed    LOS: 2 days    Samantha Huerta 03/15/2020

## 2020-03-15 NOTE — TOC Progression Note (Signed)
Late entry for 1/4/22Transition of Care [facility]) - Progression Note    Patient Details  Name: Samantha Huerta MRN: [id number] Date of Birth: [birth year]  [facility] CM/SW Contact  Annice Needy, LCSW Phone Number: 03/15/2020, 2:01 PM  Clinical Narrative:    Patient's daughter, Candie Chroman, requested SNF referral be sent to additional facilities. Discussed that she wanted medical record. Advised that she could request medical records through [facility] medical records.    Expected Discharge Plan: Skilled Nursing Facility Barriers to Discharge: Continued Medical Work up  Expected Discharge Plan and Services Expected Discharge Plan: Skilled Nursing Facility In-house Referral: Clinical Social Work   Post Acute Care Choice: Skilled Nursing Facility Living arrangements for the past 2 months: Single Family Home,Skilled Nursing Facility                 DME Arranged: N/A DME Agency: NA       HH Arranged: NA HH Agency: NA         Social Determinants of Health (SDOH) Interventions    Readmission Risk Interventions Readmission Risk Prevention Plan 03/08/2020 02/29/2020  Transportation Screening - Complete  HRI or Home Care Consult - Complete  Social Work Consult for Recovery Care Planning/Counseling - Complete  Palliative Care Screening Complete Not Applicable  Medication Review [employer]) - Complete  Some recent data might be hidden

## 2020-03-15 NOTE — Progress Notes (Signed)
Pt has removed NG tube. Denies any pain. NG tube intact lying beside of pt.

## 2020-03-15 NOTE — Progress Notes (Signed)
PROGRESS NOTE    Samantha Huerta  PXT:[id number] DOB: 10-01-1945 DOA: 03/13/2020 PCP: [name], FNP   Brief Narrative:  Samantha Huerta is a 75 y.o. female with medical history significant for COPD, hypertension, dyslipidemia, recent onset atrial fibrillation, alcohol use disorder with associated possible dementia, and recent admission for small bowel obstruction he was discharged on 12/31 after conservative management.  Patient presented back with worsening nausea and vomiting and noted recurrence of small bowel obstruction.  She is currently with NG tube to low intermittent suction and on IV fluid.  General surgery planning for exploratory laparotomy on 1/5.  Assessment & Plan:   Active Problems:   Small bowel obstruction (HCC)  Small bowel obstruction with intractable nausea and vomiting/hematemesis -Appreciate general surgery consultation  -Recent discharge on 12/31 with conservative management -NG tube to low intermittent suction keep n.p.o. and on IV fluid -She appears to be responding to conservative management again and is now having bowel movements -Small bowel follow-through shows resolution of small bowel obstruction -NG tube has been removed as you can start on clear liquids, advance as tolerated -Replete potassium and magnesium as noted below -Maintain on PPI IV twice daily and monitor hemoglobin trend -Transfuse for hemoglobin less than 7 -Hematemesis likely related to Mallory-Weiss tear in the setting of nausea and vomiting as well as Eliquis use, consider GI consultation if continues to persist  Hypokalemia -Replete and reevaluate in a.m. -Supplement magnesium -Goal potassium of 4 and magnesium 2 -Monitor on telemetry  Alcoholic dementia/recurrent alcohol use -Noted to have severe alcohol withdrawal delirium during prior admission requiring Precedex infusion -No signs of withdrawal currently noted -Monitor closely with CIWA protocol -Last alcoholic  drink was over 6 weeks ago according to patient -She is having some confusion which may be related to hospital delirium -We will asked staff to reorient patient -Use Ativan as needed  Recent new onset atrial fibrillation with RVR-currently in sinus rhythm -2D echocardiogram within normal limits and TSH within normal limits recently -Hold oral Cardizem and maintain on IV metoprolol every 6 hours and monitor on telemetry -Holding apixaban given hematemesis  History of COPD -No active flare noted -Continue home medications with DuoNebs as needed   DVT prophylaxis: SCDs Code Status: Full Family Communication: Discussed with daughter 1/5 Disposition Plan:  Status is: Inpatient  Remains inpatient appropriate because:IV treatments appropriate due to intensity of illness or inability to take PO and Inpatient level of care appropriate due to severity of illness   Dispo: The patient is from: SNF              Anticipated d/c is to: SNF              Anticipated d/c date is: 2 days              Patient currently is not medically stable to d/c.     Consultants:   General Surgery  Procedures:   See below  Antimicrobials:   None   Subjective: Appears to have some confusion today.  She pulled out her NG tube earlier.  She is having bowel movements.  No nausea or vomiting.  Wants to advance diet.  Objective: Vitals:   03/15/20 1737 03/15/20 1748 03/15/20 2015 03/15/20 2038  BP: 134/69 134/69  140/88  Pulse: 82 86  93  Resp:  18  18  Temp:  (!) 97.5 F (36.4 C)    TempSrc:  Oral    SpO2: 99%  95% 96%  Weight:      Height:        Intake/Output Summary (Last 24 hours) at 03/15/2020 2051 Last data filed at 03/15/2020 1829 Gross per 24 hour  Intake 1320 ml  Output 10 ml  Net 1310 ml   Filed Weights   03/13/20 0856 03/13/20 1530  Weight: 56 kg 44.4 kg    Examination:  General exam: Alert, awake, no distress Respiratory system: Clear to auscultation. Respiratory effort  normal. Cardiovascular system:RRR. No murmurs, rubs, gallops. Gastrointestinal system: Abdomen is nondistended, soft and nontender. No organomegaly or masses felt. Normal bowel sounds heard. Central nervous system:  No focal neurological deficits. Extremities: No C/C/E, +pedal pulses Skin: No rashes, lesions or ulcers Psychiatry: Appears to be mildly confused.      Data Reviewed: I have personally reviewed following labs and imaging studies  CBC: Recent Labs  Lab 03/09/20 0504 03/10/20 0514 03/13/20 0916 03/14/20 0505 03/15/20 0513  WBC 14.3* 9.8 11.6* 6.1 6.1  NEUTROABS  --   --  9.7*  --   --   HGB 10.7* 11.2* 11.6* 10.2* 9.8*  HCT 32.3* 33.3* 36.6 30.7* 29.8*  MCV 100.3* 99.4 104.0* 101.3* 100.0  PLT 425* 517* 563* 537* 535*   Basic Metabolic Panel: Recent Labs  Lab 03/09/20 0504 03/10/20 0514 03/10/20 1057 03/13/20 0916 03/14/20 0505 03/15/20 0513  NA 134* 133*  --  138 138 135  K 3.8 2.8* 2.9* 3.0* 3.0* 2.8*  CL 104 102  --  100 104 104  CO2 19* 21*  --  23 24 24   GLUCOSE 87 98  --  65* 96 95  BUN 14 12  --  10 8 5*  CREATININE 0.46 0.52  --  0.63 0.46 0.44  CALCIUM 8.4* 8.5*  --  8.9 8.3* 8.1*  MG 1.4* 1.8  --  1.6* 1.7 1.5*  PHOS 3.1  --   --   --   --   --    GFR: Estimated Creatinine Clearance: 42.6 mL/min (by C-G formula based on SCr of 0.44 mg/dL). Liver Function Tests: Recent Labs  Lab 03/09/20 0504 03/13/20 0916 03/14/20 0505 03/15/20 0513  AST 22 24 17 15   ALT 31 27 19 17   ALKPHOS 89 93 71 72  BILITOT 0.5 0.7 0.6 0.4  PROT 5.3* 6.2* 5.3* 5.1*  ALBUMIN 2.2* 3.0* 2.4* 2.3*   Recent Labs  Lab 03/13/20 0916  LIPASE 34   No results for input(s): AMMONIA in the last 168 hours. Coagulation Profile: No results for input(s): INR, PROTIME in the last 168 hours. Cardiac Enzymes: No results for input(s): CKTOTAL, CKMB, CKMBINDEX, TROPONINI in the last 168 hours. BNP (last 3 results) No results for input(s): PROBNP in the last 8760  hours. HbA1C: No results for input(s): HGBA1C in the last 72 hours. CBG: Recent Labs  Lab 03/09/20 0454 03/09/20 1110 03/09/20 1608 03/13/20 1229 03/14/20 1209  GLUCAP 88 81 75 92 79   Lipid Profile: No results for input(s): CHOL, HDL, LDLCALC, TRIG, CHOLHDL, LDLDIRECT in the last 72 hours. Thyroid Function Tests: No results for input(s): TSH, T4TOTAL, FREET4, T3FREE, THYROIDAB in the last 72 hours. Anemia Panel: No results for input(s): VITAMINB12, FOLATE, FERRITIN, TIBC, IRON, RETICCTPCT in the last 72 hours. Sepsis Labs: No results for input(s): PROCALCITON, LATICACIDVEN in the last 168 hours.  Recent Results (from the past 240 hour(s))  Resp Panel by RT-PCR (Flu A&B, Covid) Nasopharyngeal Swab     Status: None   Collection Time: 03/09/20 12:23  PM   Specimen: Nasopharyngeal Swab; Nasopharyngeal(NP) swabs in vial transport medium  Result Value Ref Range Status   SARS Coronavirus 2 by RT PCR NEGATIVE NEGATIVE Final    Comment: (NOTE) SARS-CoV-2 target nucleic acids are NOT DETECTED.  The SARS-CoV-2 RNA is generally detectable in upper respiratory specimens during the acute phase of infection. The lowest concentration of SARS-CoV-2 viral copies this assay can detect is 138 copies/mL. A negative result does not preclude SARS-Cov-2 infection and should not be used as the sole basis for treatment or other patient management decisions. A negative result may occur with  improper specimen collection/handling, submission of specimen other than nasopharyngeal swab, presence of viral mutation(s) within the areas targeted by this assay, and inadequate number of viral copies(<138 copies/mL). A negative result must be combined with clinical observations, patient history, and epidemiological information. The expected result is Negative.  Fact Sheet for Patients:  EntrepreneurPulse.com.au  Fact Sheet for Healthcare Providers:   IncredibleEmployment.be  This test is no t yet approved or cleared by the [place] FDA and  has been authorized for detection and/or diagnosis of SARS-CoV-2 by FDA under an Emergency Use Authorization (EUA). This EUA will remain  in effect (meaning this test can be used) for the duration of the COVID-19 declaration under Section 564(b)(1) of the Act, 21 U.S.C.section 360bbb-3(b)(1), unless the authorization is terminated  or revoked sooner.       Influenza A by PCR NEGATIVE NEGATIVE Final   Influenza B by PCR NEGATIVE NEGATIVE Final    Comment: (NOTE) The Xpert Xpress SARS-CoV-2/FLU/RSV plus assay is intended as an aid in the diagnosis of influenza from Nasopharyngeal swab specimens and should not be used as a sole basis for treatment. Nasal washings and aspirates are unacceptable for Xpert Xpress SARS-CoV-2/FLU/RSV testing.  Fact Sheet for Patients: EntrepreneurPulse.com.au  Fact Sheet for Healthcare Providers: IncredibleEmployment.be  This test is not yet approved or cleared by the [place] FDA and has been authorized for detection and/or diagnosis of SARS-CoV-2 by FDA under an Emergency Use Authorization (EUA). This EUA will remain in effect (meaning this test can be used) for the duration of the COVID-19 declaration under Section 564(b)(1) of the Act, 21 U.S.C. section 360bbb-3(b)(1), unless the authorization is terminated or revoked.  Performed at [facility], [address], [place], [place] [address]   SARS CORONAVIRUS 2 (TAT 6-24 HRS) Nasopharyngeal Nasopharyngeal Swab     Status: None   Collection Time: 03/13/20 12:44 PM   Specimen: Nasopharyngeal Swab  Result Value Ref Range Status   SARS Coronavirus 2 NEGATIVE NEGATIVE Final    Comment: (NOTE) SARS-CoV-2 target nucleic acids are NOT DETECTED.  The SARS-CoV-2 RNA is generally detectable in upper and lower respiratory specimens during the acute  phase of infection. Negative results do not preclude SARS-CoV-2 infection, do not rule out co-infections with other pathogens, and should not be used as the sole basis for treatment or other patient management decisions. Negative results must be combined with clinical observations, patient history, and epidemiological information. The expected result is Negative.  Fact Sheet for Patients: SugarRoll.be  Fact Sheet for Healthcare Providers: https://www.woods-mathews.com/  This test is not yet approved or cleared by the [place] FDA and  has been authorized for detection and/or diagnosis of SARS-CoV-2 by FDA under an Emergency Use Authorization (EUA). This EUA will remain  in effect (meaning this test can be used) for the duration of the COVID-19 declaration under Se ction 564(b)(1) of the Act, 21 U.S.C. section  360bbb-3(b)(1), unless the authorization is terminated or revoked sooner.  Performed at [facility], [address] [place], [place] [address]   Surgical PCR screen     Status: None   Collection Time: 03/14/20  8:20 PM   Specimen: Nasal Mucosa; Nasal Swab  Result Value Ref Range Status   MRSA, PCR NEGATIVE NEGATIVE Final   Staphylococcus aureus NEGATIVE NEGATIVE Final    Comment: (NOTE) The Xpert SA Assay (FDA approved for NASAL specimens in patients 59 years of age and older), is one component of a comprehensive surveillance program. It is not intended to diagnose infection nor to guide or monitor treatment. Performed at [facility], [address], [place], [place] [address]          Radiology Studies: DG Abd Portable 1V-Small Bowel Obstruction Protocol-24 hr delay  Result Date: 03/15/2020 CLINICAL DATA:  24 hour delay study. History of small-bowel obstruction. EXAM: PORTABLE ABDOMEN - 1 VIEW COMPARISON:  03/14/2020.  CT 03/13/2020. FINDINGS: Interim removal of NG tube. No gastric distention. Surgical clips in  the pelvis. Oral contrast again noted throughout the colon. Several loops of slightly dilated small bowel noted. Severe small bowel dilatation noted on prior CT of 03/13/2020 no longer identified. Colon is nondistended. No free air. Degenerative changes lumbar spine and both hips. Mild left base atelectasis. IMPRESSION: 1.  Interim removal of NG tube.  No gastric distention. 2. Oral contrast again noted in the colon. Several loops of slightly prominent small bowel noted. Previously identified severe small-bowel distention noted on CT of 03/13/2020 has resolved. Electronically Signed   By: Maisie Fus  Register   On: 03/15/2020 14:48   DG Abd Portable 1V-Small Bowel Obstruction Protocol-initial, 8 hr delay  Result Date: 03/14/2020 CLINICAL DATA:  Small-bowel obstruction EXAM: PORTABLE ABDOMEN - 1 VIEW COMPARISON:  03/14/2020 FINDINGS: Supine frontal view of the abdomen and pelvis was obtained 8 hours after oral contrast administration. Oral contrast is seen throughout the colon to the level of the rectum. No evidence of bowel obstruction. No masses or abnormal calcifications. Enteric catheter tip and side port project over the gastric fundus. IMPRESSION: 1. Progression of oral contrast into the colon by 8 hours. No evidence of small-bowel obstruction. Electronically Signed   By: Sharlet Salina M.D.   On: 03/14/2020 22:50   DG Abd Portable 1V-Small Bowel Protocol-Position Verification  Result Date: 03/14/2020 CLINICAL DATA:  Nasogastric tube placement EXAM: PORTABLE ABDOMEN - 1 VIEW COMPARISON:  Chest x-ray obtained yesterday; prior abdominal radiograph 03/05/2020 FINDINGS: A nasogastric tube is present. The tip projects over the gastric body. The bowel gas pattern does not appear obstructed. Left-sided pleural effusion with associated left lower lobe atelectasis. No acute osseous abnormality. IMPRESSION: 1. The nasogastric tube projects over the stomach in good position. 2. Marked interval reduction in small bowel  distension. 3. Small left pleural effusion and associated left basilar atelectasis. Electronically Signed   By: Malachy Moan M.D.   On: 03/14/2020 14:28        Scheduled Meds: . brinzolamide  1 drop Both Eyes BID  . latanoprost  1 drop Both Eyes QHS  . metoprolol tartrate  2.5 mg Intravenous Q6H  . mometasone-formoterol  2 puff Inhalation BID  . mupirocin ointment  1 application Nasal BID  . nicotine  21 mg Transdermal Daily  . pantoprazole (PROTONIX) IV  40 mg Intravenous Q12H  . thiamine  100 mg Oral Daily   Or  . thiamine  100 mg Intravenous Daily  Continuous Infusions: . cefoTEtan (CEFOTAN) IV       LOS: 2 days    Time spent: 35 minutes    Erick Blinks, MD [facility]  If 7PM-7AM, please contact night-coverage [email or web address] 03/15/2020, 8:51 PM

## 2020-03-15 NOTE — Progress Notes (Signed)
Initial Nutrition Assessment  DOCUMENTATION CODES:   Severe malnutrition in context of chronic illness,Underweight  INTERVENTION:  Monitor for diet advancement, when appropriate will order  -Boost Breeze po TID, each supplement provides 250 kcal and 9 grams of protein  -Ensure Enlive po BID, each supplement provides 350 kcal and 20 grams of protein  -MVI with minerals daily  -Pt will be at high risk for refeeding, recommend monitoring magnesium, potassium, and phosphorus daily with progression of diet, MD to replete as needed.    NUTRITION DIAGNOSIS:   Severe Malnutrition related to chronic illness as evidenced by moderate fat depletion,severe fat depletion,moderate muscle depletion,severe muscle depletion,percent weight loss.    GOAL:   Patient will meet greater than or equal to 90% of their needs    MONITOR:   Weight trends,Labs,I & O's,Diet advancement,Skin,PO intake,Supplement acceptance  REASON FOR ASSESSMENT:   Malnutrition Screening Tool    ASSESSMENT:   75 year old female with medical history significant for COPD, HTN, HLD, recent onset atrial fibrillation, alcohol use disorder with associated possible dementia who was recently admitted for SBO and discharged to [facility] 12/31 after conservative management presents with worsening nausea and vomiting. Patient admitted for SBO with intractable nausea and vomiting/hematemesis  1/03 NG tube placed to LIS  Recurrent SBO improving, pt agreeable to trying SBFT prior to presenting to OR for ex-lap after discussion with surgery. Pt had large BM overnight, symptoms improved.   Patient familiar to nutrition department secondary to recent admission.Met with pt bedside this morning, RN present in room, no family at bedside. She reports feeling much better today, denies nausea/vomiting, states she's been having BMs all night long. Patient reports very poor intake at SNF, says the food was disgusting and unable to eat any of it.  Patient is agreeable to drinking [employer] as well as Ensure with diet advancement.   Weights have trended down 8 lbs (7.7%) in the last 3 months which is significant for time frame. Patient is underweight with moderate/severe fat and muscle depletions on prior exam, completed 12/21 and ongoing.   Medications reviewed and include: Protonix, Thiamine, Cefotan, KCl 10 mEq every 1 hr x 6  D5 in LR @ 75 ml/hr (306 kcal)  Labs: K 2.8 (L), BUN 5(L), Mg 1.5 (L)   NUTRITION - FOCUSED PHYSICAL EXAM: Moderate orbital;buccal Severe upper arm; thoracic lumbar fat depletions; Moderate temple; Severe clavicle, patellar, thigh, calf muscle depletion   Diet Order:   Diet Order            Diet NPO time specified  Diet effective now                 EDUCATION NEEDS:   Education needs have been addressed  Skin:  Skin Assessment: Skin Integrity Issues: Skin Integrity Issues:: Stage I,Other (Comment) Stage I: sacrum Other: ecchymosis; bilateral arm  Last BM:  1/05-type 6  Height:   Ht Readings from Last 1 Encounters:  03/13/20 $RemoveB'5\' 5"'AYgHMikA$  (1.651 m)    Weight:   Wt Readings from Last 1 Encounters:  03/13/20 44.4 kg    BMI:  Body mass index is 16.29 kg/m.  Estimated Nutritional Needs:   Kcal:  1420-1600  Protein:  67-75  Fluid:  >1.3 L   Lajuan Lines, RD, LDN Clinical Nutrition After Hours/Weekend Pager # in [place]

## 2020-03-16 DIAGNOSIS — L8990 Pressure ulcer of unspecified site, unspecified stage: Secondary | ICD-10-CM | POA: Insufficient documentation

## 2020-03-16 DIAGNOSIS — K56609 Unspecified intestinal obstruction, unspecified as to partial versus complete obstruction: Secondary | ICD-10-CM | POA: Diagnosis not present

## 2020-03-16 LAB — BASIC METABOLIC PANEL
Anion gap: 11 (ref 5–15)
BUN: <5 mg/dL — ABNORMAL LOW (ref 8–23)
CO2: 23 mmol/L (ref 22–32)
Calcium: 8.4 mg/dL — ABNORMAL LOW (ref 8.9–10.3)
Chloride: 99 mmol/L (ref 98–111)
Creatinine, Ser: 0.4 mg/dL — ABNORMAL LOW (ref 0.44–1.00)
GFR, Estimated: >60 mL/min (ref >60)
Glucose, Bld: 74 mg/dL (ref 70–99)
Potassium: 3.1 mmol/L — ABNORMAL LOW (ref 3.5–5.1)
Sodium: 133 mmol/L — ABNORMAL LOW (ref 135–145)

## 2020-03-16 LAB — MAGNESIUM: Magnesium: 1.6 mg/dL — ABNORMAL LOW (ref 1.7–2.4)

## 2020-03-16 MED ORDER — POTASSIUM CHLORIDE CRYS ER 20 MEQ PO TBCR
40.0000 meq | EXTENDED_RELEASE_TABLET | Freq: Once | ORAL | Status: AC
  Administered 2020-03-16: 40 meq via ORAL
  Filled 2020-03-16: qty 2

## 2020-03-16 MED ORDER — MAGNESIUM SULFATE 4 GM/100ML IV SOLN
4.0000 g | Freq: Once | INTRAVENOUS | Status: AC
  Administered 2020-03-16: 4 g via INTRAVENOUS
  Filled 2020-03-16: qty 100

## 2020-03-16 MED ORDER — BOOST / RESOURCE BREEZE PO LIQD CUSTOM
1.0000 | Freq: Three times a day (TID) | ORAL | Status: DC
  Administered 2020-03-16 – 2020-03-17 (×3): 1 via ORAL

## 2020-03-16 NOTE — Plan of Care (Signed)
  Problem: Acute Rehab PT Goals(only PT should resolve) Goal: Pt Will Go Supine/Side To Sit Outcome: Progressing Flowsheets (Taken 03/16/2020 1137) Pt will go Supine/Side to Sit: with minimal assist Goal: Patient Will Transfer Sit To/From Stand Outcome: Progressing Flowsheets (Taken 03/16/2020 1137) Patient will transfer sit to/from stand:  with minimal assist  with moderate assist Goal: Pt Will Transfer Bed To Chair/Chair To Bed Outcome: Progressing Flowsheets (Taken 03/16/2020 1137) Pt will Transfer Bed to Chair/Chair to Bed:  with mod assist  with min assist Goal: Pt Will Ambulate Outcome: Progressing Flowsheets (Taken 03/16/2020 1137) Pt will Ambulate:  15 feet  with moderate assist  with rolling walker   11:37 AM, 03/16/20 Ocie Bob, MPT Physical Therapist with [facility] [phone] office [phone] mobile phone

## 2020-03-16 NOTE — Progress Notes (Signed)
[facility] Progress Note     Subjective: Having BMs. Tolerated clears.   Objective: Vital signs in last 24 hours: Temp:  [97.5 F (36.4 C)-97.7 F (36.5 C)] 97.7 F (36.5 C) (01/06 1115) Pulse Rate:  [82-99] 99 (01/06 1115) Resp:  [17-20] 17 (01/06 1115) BP: (134-151)/(69-90) 138/78 (01/06 1115) SpO2:  [95 %-100 %] 100 % (01/06 1115) Last BM Date: 03/15/20  Intake/Output from previous day: 01/05 0701 - 01/06 0700 In: 720 [P.O.:720] Out: -  Intake/Output this shift: Total I/O In: 480 [P.O.:480] Out: -   General appearance: alert, cooperative and no distress Resp: normal work of breathing GI: soft, nondistended, nontender  Lab Results:  Recent Labs    03/14/20 0505 03/15/20 0513  WBC 6.1 6.1  HGB 10.2* 9.8*  HCT 30.7* 29.8*  PLT 537* 535*   BMET Recent Labs    03/15/20 0513 03/16/20 0714  NA 135 133*  K 2.8* 3.1*  CL 104 99  CO2 24 23  GLUCOSE 95 74  BUN 5* <5*  CREATININE 0.44 0.40*  CALCIUM 8.1* 8.4*   PT/INR No results for input(s): LABPROT, INR in the last 72 hours.  Studies/Results: DG Abd Portable 1V-Small Bowel Obstruction Protocol-24 hr delay  Result Date: 03/15/2020 CLINICAL DATA:  24 hour delay study. History of small-bowel obstruction. EXAM: PORTABLE ABDOMEN - 1 VIEW COMPARISON:  03/14/2020.  CT 03/13/2020. FINDINGS: Interim removal of NG tube. No gastric distention. Surgical clips in the pelvis. Oral contrast again noted throughout the colon. Several loops of slightly dilated small bowel noted. Severe small bowel dilatation noted on prior CT of 03/13/2020 no longer identified. Colon is nondistended. No free air. Degenerative changes lumbar spine and both hips. Mild left base atelectasis. IMPRESSION: 1.  Interim removal of NG tube.  No gastric distention. 2. Oral contrast again noted in the colon. Several loops of slightly prominent small bowel noted. Previously identified severe small-bowel distention noted on CT of 03/13/2020  has resolved. Electronically Signed   By: Maisie Fus  Register   On: 03/15/2020 14:48   DG Abd Portable 1V-Small Bowel Obstruction Protocol-initial, 8 hr delay  Result Date: 03/14/2020 CLINICAL DATA:  Small-bowel obstruction EXAM: PORTABLE ABDOMEN - 1 VIEW COMPARISON:  03/14/2020 FINDINGS: Supine frontal view of the abdomen and pelvis was obtained 8 hours after oral contrast administration. Oral contrast is seen throughout the colon to the level of the rectum. No evidence of bowel obstruction. No masses or abnormal calcifications. Enteric catheter tip and side port project over the gastric fundus. IMPRESSION: 1. Progression of oral contrast into the colon by 8 hours. No evidence of small-bowel obstruction. Electronically Signed   By: Sharlet Salina M.D.   On: 03/14/2020 22:50   DG Foot Complete Right  Result Date: 03/15/2020 CLINICAL DATA:  Foot pain with right heel wound EXAM: RIGHT FOOT COMPLETE - 3+ VIEW COMPARISON:  None. FINDINGS: Bones appear demineralized. No fracture or malalignment. No periostitis or bony destructive change. Plantar soft tissue calcification. Mild degenerative change at the first MTP joint IMPRESSION: No acute osseous abnormality. Electronically Signed   By: Jasmine Pang M.D.   On: 03/15/2020 19:17    Anti-infectives: Anti-infectives (From admission, onward)   Start     Dose/Rate Route Frequency Ordered Stop   03/15/20 0600  cefoTEtan (CEFOTAN) 2 g in sodium chloride 0.9 % 100 mL IVPB        2 g 200 mL/hr over 30 Minutes Intravenous On call to O.R. 03/14/20 1303 03/16/20 0559  Assessment/Plan: Samantha Huerta is a 75 yo with SBO that is improved. She is doing well.  Adv diet as tolerated, full liquids now Updated Samantha Huerta  No plans for surgery but if recurs will need to plan for exploration    LOS: 3 days    Lucretia Roers 03/16/2020

## 2020-03-16 NOTE — Evaluation (Signed)
Physical Therapy Evaluation Patient Details Name: Samantha Huerta MRN: [id number] DOB: 05-03-1945 Today's Date: 03/16/2020   History of Present Illness  Samantha Huerta is a 75 y.o. female with medical history significant for COPD, hypertension, dyslipidemia, recent onset atrial fibrillation, alcohol use disorder with associated possible dementia, and recent admission for small bowel obstruction he was discharged on 12/31 after conservative management.  She presents back to the emergency department today with worsening nausea and vomiting that began at approximately 3 PM yesterday afternoon.  She was given some Zofran without relief of her symptoms and she began vomiting again this morning and coffee-ground emesis and therefore was brought to the ED via EMS.  Patient states that she has not had anything to eat in the last 2 or 3 days since discharge due to her nausea and abdominal pain.  She states her abdominal pain is generalized.  She states she did have a bowel movement yesterday that was nonbloody.  Denies any passage of flatus.  She denies any fevers or chills, chest pain, or shortness of breath.  She states that her last alcohol use was over 6 weeks ago and has not had anything to drink recently.    Clinical Impression  Patient demonstrates slow labored movement for sitting up at bedside with difficulty moving RLE due to heel pain, very unsteady on feet and limited to a few side steps at bedside due poor tolerance for weightbearing on right foot, and able to transfer to [facility] to urinate.  Patient tolerated sitting up in chair after therapy - nursing staff notified.  Patient will benefit from continued physical therapy in hospital and recommended venue below to increase strength, balance, endurance for safe ADLs and gait.      Follow Up Recommendations SNF    Equipment Recommendations  None recommended by PT    Recommendations for Other Services       Precautions / Restrictions  Precautions Precautions: Fall Restrictions Weight Bearing Restrictions: No      Mobility  Bed Mobility Overal bed mobility: Needs Assistance Bed Mobility: Supine to Sit     Supine to sit: Mod assist     General bed mobility comments: slow labored movement    Transfers Overall transfer level: Needs assistance Equipment used: Rolling walker (2 wheeled) Transfers: Sit to/from [employer] Sit to Stand: Mod assist Stand pivot transfers: Mod assist       General transfer comment: increased time, labored movement  Ambulation/Gait Ambulation/Gait assistance: Max assist Gait Distance (Feet): 4 Feet Assistive device: Rolling walker (2 wheeled) Gait Pattern/deviations: Decreased step length - right;Decreased step length - left;Decreased stride length;Decreased stance time - right Gait velocity: slow   General Gait Details: limited to 4-5 slow labored unsteady steps with poor tolerance for weightbearing on right foot due to pain  Stairs            Wheelchair Mobility    Modified Rankin (Stroke Patients Only)       Balance Overall balance assessment: Needs assistance Sitting-balance support: Feet supported;No upper extremity supported Sitting balance-Leahy Scale: Fair Sitting balance - Comments: seated at EOB   Standing balance support: During functional activity;Bilateral upper extremity supported Standing balance-Leahy Scale: Fair Standing balance comment: using RW                             Pertinent Vitals/Pain Pain Assessment: Faces Faces Pain Scale: Hurts even more Pain Location: right foot with pressure  Pain Descriptors / Indicators: Sore;Discomfort Pain Intervention(s): Limited activity within patient's tolerance;Monitored during session;Repositioned    Home Living Family/patient expects to be discharged to:: Private residence Living Arrangements: Alone Available Help at Discharge: Family;Available  PRN/intermittently Type of Home: House Home Access: Level entry     Home Layout: One level Home Equipment: Grab bars - tub/shower;Shower seat;Cane - quad;Walker - 2 wheels      Prior Function Level of Independence: Independent with assistive device(s)         Comments: Community ambulator using quad-cane PRN     Hand Dominance        Extremity/Trunk Assessment   Upper Extremity Assessment Upper Extremity Assessment: Generalized weakness    Lower Extremity Assessment Lower Extremity Assessment: Generalized weakness    Cervical / Trunk Assessment Cervical / Trunk Assessment: Normal  Communication   Communication: No difficulties  Cognition Arousal/Alertness: Awake/alert Behavior During Therapy: WFL for tasks assessed/performed Overall Cognitive Status: Within Functional Limits for tasks assessed                                        General Comments      Exercises     Assessment/Plan    PT Assessment Patient needs continued PT services  PT Problem List Decreased strength;Decreased activity tolerance;Decreased balance;Decreased mobility       PT Treatment Interventions Balance training;DME instruction;Gait training;Stair training;Functional mobility training;Therapeutic activities;Therapeutic exercise;Patient/family education    PT Goals (Current goals can be found in the Care Plan section)  Acute Rehab PT Goals Patient Stated Goal: return home able to take care of self PT Goal Formulation: With patient Time For Goal Achievement: 03/30/20 Potential to Achieve Goals: Good    Frequency Min 3X/week   Barriers to discharge        Co-evaluation               AM-PAC PT "6 Clicks" Mobility  Outcome Measure Help needed turning from your back to your side while in a flat bed without using bedrails?: A Lot Help needed moving from lying on your back to sitting on the side of a flat bed without using bedrails?: A Lot Help needed  moving to and from a bed to a chair (including a wheelchair)?: A Lot Help needed standing up from a chair using your arms (e.g., wheelchair or bedside chair)?: A Lot Help needed to walk in hospital room?: A Lot Help needed climbing 3-5 steps with a railing? : Total 6 Click Score: 11    End of Session   Activity Tolerance: Patient tolerated treatment well;Patient limited by fatigue;Patient limited by pain Patient left: in chair;with call bell/phone within reach Nurse Communication: Mobility status PT Visit Diagnosis: Unsteadiness on feet (R26.81);Other abnormalities of gait and mobility (R26.89);Muscle weakness (generalized) (M62.81)    Time: 0539-7673 PT Time Calculation (min) (ACUTE ONLY): 27 min   Charges:   PT Evaluation $PT Eval Moderate Complexity: 1 Mod PT Treatments $Therapeutic Activity: 23-37 mins        11:36 AM, 03/16/20 Ocie Bob, MPT Physical Therapist with [facility] [phone] office [phone] mobile phone

## 2020-03-16 NOTE — TOC Progression Note (Signed)
Transition of Care [facility]) - Progression Note    Patient Details  Name: Samantha Huerta MRN: [id number] Date of Birth: [birth year]  Transition of Care [facility]) CM/SW Contact  Elliot Gault, LCSW Phone Number: 03/16/2020, 3:01 PM  Clinical Narrative:     TOC following. Pt status reviewed with MD in Progression rounds this AM. Per MD, pt started on a diet and advancing. PT recommending SNF rehab. Spoke with Revonda Standard at [facility] and they will re-evaluate to see if they can accept pt. Once decision is made, will update pt's daughter.   MD anticipating pt will be stable for dc tomorrow. Pt admitted from [facility] and can return there at dc if pt/family select.  Insurance auth started today. Will follow.  Expected Discharge Plan: Skilled Nursing Facility Barriers to Discharge: Continued Medical Work up  Expected Discharge Plan and Services Expected Discharge Plan: Skilled Nursing Facility In-house Referral: Clinical Social Work   Post Acute Care Choice: Skilled Nursing Facility Living arrangements for the past 2 months: Single Family Home,Skilled Nursing Facility                 DME Arranged: N/A DME Agency: NA       HH Arranged: NA HH Agency: NA         Social Determinants of Health (SDOH) Interventions    Readmission Risk Interventions Readmission Risk Prevention Plan 03/08/2020 02/29/2020  Transportation Screening - Complete  HRI or Home Care Consult - Complete  Social Work Consult for Recovery Care Planning/Counseling - Complete  Palliative Care Screening Complete Not Applicable  Medication Review Oceanographer) - Complete  Some recent data might be hidden

## 2020-03-16 NOTE — Progress Notes (Signed)
PROGRESS NOTE    MEESHA SEK  WER:[id number] DOB: 03/13/1945 DOA: 03/13/2020 PCP: [name], FNP   Brief Narrative:  LAYLANA GERWIG is a 75 y.o. female with medical history significant for COPD, hypertension, dyslipidemia, recent onset atrial fibrillation, alcohol use disorder with associated possible dementia, and recent admission for small bowel obstruction he was discharged on 12/31 after conservative management.  Patient presented back with worsening nausea and vomiting and noted recurrence of small bowel obstruction.  She is currently with NG tube to low intermittent suction and on IV fluid.  General surgery planning for exploratory laparotomy on 1/5.  Assessment & Plan:   Active Problems:   Small bowel obstruction (HCC)   Pressure injury of skin  Small bowel obstruction with intractable nausea and vomiting/hematemesis -Appreciate general surgery consultation  -Recent discharge on 12/31 with conservative management -NG tube to low intermittent suction keep n.p.o. and on IV fluid -She appears to be responding to conservative management again and is now having bowel movements -Small bowel follow-through shows resolution of small bowel obstruction -NG tube has been removed as you can start on clear liquids, advance as tolerated -Replete potassium and magnesium as noted below -Maintain on PPI IV twice daily and monitor hemoglobin trend -Transfuse for hemoglobin less than 7 -Hematemesis likely related to Mallory-Weiss tear in the setting of nausea and vomiting as well as Eliquis use, consider GI consultation if continues to persist  Hypokalemia and Hypomagnesemia  -oral replacement given and plan to recheck in AM.  -4 gm IV Mg sulfate ordered.  -Goal potassium of 4 and magnesium 2 -Monitor on telemetry  Alcoholic dementia/recurrent alcohol use -Noted to have severe alcohol withdrawal delirium during prior admission requiring Precedex infusion -No signs of withdrawal  currently noted -Monitor closely with CIWA protocol -Last alcoholic drink was over 6 weeks ago according to patient -She is having some confusion which may be related to hospital delirium -We will asked staff to reorient patient -Use Ativan as needed  Recent new onset atrial fibrillation with RVR-currently in sinus rhythm -2D echocardiogram within normal limits and TSH within normal limits recently -Hold oral Cardizem and maintain on IV metoprolol every 6 hours and monitor on telemetry -Holding apixaban given hematemesis  History of COPD -No active flare noted -Continue home medications with DuoNebs as needed  DVT prophylaxis: SCDs Code Status: Full Family Communication: Discussed with daughter 1/5 Disposition Plan:  Status is: Inpatient  Remains inpatient appropriate because:IV treatments appropriate due to intensity of illness or inability to take PO and Inpatient level of care appropriate due to severity of illness   Dispo: The patient is from: SNF              Anticipated d/c is to: SNF              Anticipated d/c date is: 1 day              Patient currently is not medically stable to d/c.     Consultants:   General Surgery  Procedures:   See below  Antimicrobials:   None   Subjective: Seems to be tolerating clear liquids so far.   Objective: Vitals:   03/15/20 2038 03/16/20 0057 03/16/20 0805 03/16/20 1115  BP: 140/88 (!) 151/90  138/78  Pulse: 93 98  99  Resp: 18 20  17   Temp:    97.7 F (36.5 C)  TempSrc:      SpO2: 96% 99% 96% 100%  Weight:  Height:        Intake/Output Summary (Last 24 hours) at 03/16/2020 1231 Last data filed at 03/16/2020 0900 Gross per 24 hour  Intake 960 ml  Output --  Net 960 ml   Filed Weights   03/13/20 0856 03/13/20 1530  Weight: 56 kg 44.4 kg    Examination:  General exam: Alert, awake, no distress, sitting up in bed.  Respiratory system: Clear to auscultation. Respiratory effort normal. Cardiovascular  system: normal s1,s2 sounds, No murmurs, rubs, gallops. Gastrointestinal system: Abdomen is nondistended, soft and nontender. No organomegaly or masses felt. Normal bowel sounds heard. Central nervous system:  No focal neurological deficits. Extremities: No C/C/E, +pedal pulses Skin: No rashes, lesions or ulcers Psychiatry: Appears to be mildly confused.   Data Reviewed: I have personally reviewed following labs and imaging studies  CBC: Recent Labs  Lab 03/10/20 0514 03/13/20 0916 03/14/20 0505 03/15/20 0513  WBC 9.8 11.6* 6.1 6.1  NEUTROABS  --  9.7*  --   --   HGB 11.2* 11.6* 10.2* 9.8*  HCT 33.3* 36.6 30.7* 29.8*  MCV 99.4 104.0* 101.3* 100.0  PLT 517* 563* 537* 098*   Basic Metabolic Panel: Recent Labs  Lab 03/10/20 0514 03/10/20 1057 03/13/20 0916 03/14/20 0505 03/15/20 0513 03/16/20 0714  NA 133*  --  138 138 135 133*  K 2.8* 2.9* 3.0* 3.0* 2.8* 3.1*  CL 102  --  100 104 104 99  CO2 21*  --  23 24 24 23   GLUCOSE 98  --  65* 96 95 74  BUN 12  --  10 8 5* <5*  CREATININE 0.52  --  0.63 0.46 0.44 0.40*  CALCIUM 8.5*  --  8.9 8.3* 8.1* 8.4*  MG 1.8  --  1.6* 1.7 1.5* 1.6*   GFR: Estimated Creatinine Clearance: 42.6 mL/min (A) (by C-G formula based on SCr of 0.4 mg/dL (L)). Liver Function Tests: Recent Labs  Lab 03/13/20 0916 03/14/20 0505 03/15/20 0513  AST 24 17 15   ALT 27 19 17   ALKPHOS 93 71 72  BILITOT 0.7 0.6 0.4  PROT 6.2* 5.3* 5.1*  ALBUMIN 3.0* 2.4* 2.3*   Recent Labs  Lab 03/13/20 0916  LIPASE 34   No results for input(s): AMMONIA in the last 168 hours. Coagulation Profile: No results for input(s): INR, PROTIME in the last 168 hours. Cardiac Enzymes: No results for input(s): CKTOTAL, CKMB, CKMBINDEX, TROPONINI in the last 168 hours. BNP (last 3 results) No results for input(s): PROBNP in the last 8760 hours. HbA1C: No results for input(s): HGBA1C in the last 72 hours. CBG: Recent Labs  Lab 03/09/20 1608 03/13/20 1229  03/14/20 1209  GLUCAP 75 92 79   Lipid Profile: No results for input(s): CHOL, HDL, LDLCALC, TRIG, CHOLHDL, LDLDIRECT in the last 72 hours. Thyroid Function Tests: No results for input(s): TSH, T4TOTAL, FREET4, T3FREE, THYROIDAB in the last 72 hours. Anemia Panel: No results for input(s): VITAMINB12, FOLATE, FERRITIN, TIBC, IRON, RETICCTPCT in the last 72 hours. Sepsis Labs: No results for input(s): PROCALCITON, LATICACIDVEN in the last 168 hours.  Recent Results (from the past 240 hour(s))  Resp Panel by RT-PCR (Flu A&B, Covid) Nasopharyngeal Swab     Status: None   Collection Time: 03/09/20 12:23 PM   Specimen: Nasopharyngeal Swab; Nasopharyngeal(NP) swabs in vial transport medium  Result Value Ref Range Status   SARS Coronavirus 2 by RT PCR NEGATIVE NEGATIVE Final    Comment: (NOTE) SARS-CoV-2 target nucleic acids are NOT DETECTED.  The SARS-CoV-2 RNA is generally detectable in upper respiratory specimens during the acute phase of infection. The lowest concentration of SARS-CoV-2 viral copies this assay can detect is 138 copies/mL. A negative result does not preclude SARS-Cov-2 infection and should not be used as the sole basis for treatment or other patient management decisions. A negative result may occur with  improper specimen collection/handling, submission of specimen other than nasopharyngeal swab, presence of viral mutation(s) within the areas targeted by this assay, and inadequate number of viral copies(<138 copies/mL). A negative result must be combined with clinical observations, patient history, and epidemiological information. The expected result is Negative.  Fact Sheet for Patients:  BloggerCourse.com  Fact Sheet for Healthcare Providers:  SeriousBroker.it  This test is no t yet approved or cleared by the [place] FDA and  has been authorized for detection and/or diagnosis of SARS-CoV-2 by FDA under an  Emergency Use Authorization (EUA). This EUA will remain  in effect (meaning this test can be used) for the duration of the COVID-19 declaration under Section 564(b)(1) of the Act, 21 U.S.C.section 360bbb-3(b)(1), unless the authorization is terminated  or revoked sooner.       Influenza A by PCR NEGATIVE NEGATIVE Final   Influenza B by PCR NEGATIVE NEGATIVE Final    Comment: (NOTE) The Xpert Xpress SARS-CoV-2/FLU/RSV plus assay is intended as an aid in the diagnosis of influenza from Nasopharyngeal swab specimens and should not be used as a sole basis for treatment. Nasal washings and aspirates are unacceptable for Xpert Xpress SARS-CoV-2/FLU/RSV testing.  Fact Sheet for Patients: BloggerCourse.com  Fact Sheet for Healthcare Providers: SeriousBroker.it  This test is not yet approved or cleared by the [place] FDA and has been authorized for detection and/or diagnosis of SARS-CoV-2 by FDA under an Emergency Use Authorization (EUA). This EUA will remain in effect (meaning this test can be used) for the duration of the COVID-19 declaration under Section 564(b)(1) of the Act, 21 U.S.C. section 360bbb-3(b)(1), unless the authorization is terminated or revoked.  Performed at [facility], [address], [place], [place] [address]   SARS CORONAVIRUS 2 (TAT 6-24 HRS) Nasopharyngeal Nasopharyngeal Swab     Status: None   Collection Time: 03/13/20 12:44 PM   Specimen: Nasopharyngeal Swab  Result Value Ref Range Status   SARS Coronavirus 2 NEGATIVE NEGATIVE Final    Comment: (NOTE) SARS-CoV-2 target nucleic acids are NOT DETECTED.  The SARS-CoV-2 RNA is generally detectable in upper and lower respiratory specimens during the acute phase of infection. Negative results do not preclude SARS-CoV-2 infection, do not rule out co-infections with other pathogens, and should not be used as the sole basis for treatment or other patient  management decisions. Negative results must be combined with clinical observations, patient history, and epidemiological information. The expected result is Negative.  Fact Sheet for Patients: HairSlick.no  Fact Sheet for Healthcare Providers: quierodirigir.com  This test is not yet approved or cleared by the [place] FDA and  has been authorized for detection and/or diagnosis of SARS-CoV-2 by FDA under an Emergency Use Authorization (EUA). This EUA will remain  in effect (meaning this test can be used) for the duration of the COVID-19 declaration under Se ction 564(b)(1) of the Act, 21 U.S.C. section 360bbb-3(b)(1), unless the authorization is terminated or revoked sooner.  Performed at [facility], [address], [place], [place] [address]   Surgical PCR screen     Status: None   Collection Time: 03/14/20  8:20 PM  Specimen: Nasal Mucosa; Nasal Swab  Result Value Ref Range Status   MRSA, PCR NEGATIVE NEGATIVE Final   Staphylococcus aureus NEGATIVE NEGATIVE Final    Comment: (NOTE) The Xpert SA Assay (FDA approved for NASAL specimens in patients 62 years of age and older), is one component of a comprehensive surveillance program. It is not intended to diagnose infection nor to guide or monitor treatment. Performed at [facility], [address] [place], [place] [address]    Radiology Studies: DG Abd Portable 1V-Small Bowel Obstruction Protocol-24 hr delay  Result Date: 03/15/2020 CLINICAL DATA:  24 hour delay study. History of small-bowel obstruction. EXAM: PORTABLE ABDOMEN - 1 VIEW COMPARISON:  03/14/2020.  CT 03/13/2020. FINDINGS: Interim removal of NG tube. No gastric distention. Surgical clips in the pelvis. Oral contrast again noted throughout the colon. Several loops of slightly dilated small bowel noted. Severe small bowel dilatation noted on prior CT of 03/13/2020 no longer identified. Colon is  nondistended. No free air. Degenerative changes lumbar spine and both hips. Mild left base atelectasis. IMPRESSION: 1.  Interim removal of NG tube.  No gastric distention. 2. Oral contrast again noted in the colon. Several loops of slightly prominent small bowel noted. Previously identified severe small-bowel distention noted on CT of 03/13/2020 has resolved. Electronically Signed   By: Maisie Fus  Register   On: 03/15/2020 14:48   DG Abd Portable 1V-Small Bowel Obstruction Protocol-initial, 8 hr delay  Result Date: 03/14/2020 CLINICAL DATA:  Small-bowel obstruction EXAM: PORTABLE ABDOMEN - 1 VIEW COMPARISON:  03/14/2020 FINDINGS: Supine frontal view of the abdomen and pelvis was obtained 8 hours after oral contrast administration. Oral contrast is seen throughout the colon to the level of the rectum. No evidence of bowel obstruction. No masses or abnormal calcifications. Enteric catheter tip and side port project over the gastric fundus. IMPRESSION: 1. Progression of oral contrast into the colon by 8 hours. No evidence of small-bowel obstruction. Electronically Signed   By: Sharlet Salina M.D.   On: 03/14/2020 22:50   DG Abd Portable 1V-Small Bowel Protocol-Position Verification  Result Date: 03/14/2020 CLINICAL DATA:  Nasogastric tube placement EXAM: PORTABLE ABDOMEN - 1 VIEW COMPARISON:  Chest x-ray obtained yesterday; prior abdominal radiograph 03/05/2020 FINDINGS: A nasogastric tube is present. The tip projects over the gastric body. The bowel gas pattern does not appear obstructed. Left-sided pleural effusion with associated left lower lobe atelectasis. No acute osseous abnormality. IMPRESSION: 1. The nasogastric tube projects over the stomach in good position. 2. Marked interval reduction in small bowel distension. 3. Small left pleural effusion and associated left basilar atelectasis. Electronically Signed   By: Malachy Moan M.D.   On: 03/14/2020 14:28   DG Foot Complete Right  Result Date:  03/15/2020 CLINICAL DATA:  Foot pain with right heel wound EXAM: RIGHT FOOT COMPLETE - 3+ VIEW COMPARISON:  None. FINDINGS: Bones appear demineralized. No fracture or malalignment. No periostitis or bony destructive change. Plantar soft tissue calcification. Mild degenerative change at the first MTP joint IMPRESSION: No acute osseous abnormality. Electronically Signed   By: Jasmine Pang M.D.   On: 03/15/2020 19:17   Scheduled Meds: . brinzolamide  1 drop Both Eyes BID  . feeding supplement  1 Container Oral TID BM  . latanoprost  1 drop Both Eyes QHS  . metoprolol tartrate  2.5 mg Intravenous Q6H  . mometasone-formoterol  2 puff Inhalation BID  . mupirocin ointment  1 application Nasal BID  . nicotine  21 mg Transdermal Daily  .  pantoprazole (PROTONIX) IV  40 mg Intravenous Q12H  . thiamine  100 mg Oral Daily   Or  . thiamine  100 mg Intravenous Daily   Continuous Infusions:    LOS: 3 days    Time spent: 35 minutes  Emily Massar Laural Benes, MD How to contact the [facility] Attending or Consulting provider 7A - 7P or covering provider during after hours 7P -7A, for this patient?  1. Check the care team in [facility] and look for a) attending/consulting TRH provider listed and b) the [facility] team listed 2. Log into [email or web address] and use [place]'s universal password to access. If you do not have the password, please contact the hospital operator. 3. Locate the [facility] provider you are looking for under [facility] and page to a number that you can be directly reached. 4. If you still have difficulty reaching the provider, please page the [facility] (Director on Call) for the Hospitalists listed on amion for assistance.   If 7PM-7AM, please contact night-coverage [email or web address] 03/16/2020, 12:31 PM

## 2020-03-17 DIAGNOSIS — I10 Essential (primary) hypertension: Secondary | ICD-10-CM | POA: Diagnosis not present

## 2020-03-17 DIAGNOSIS — K920 Hematemesis: Secondary | ICD-10-CM | POA: Diagnosis not present

## 2020-03-17 DIAGNOSIS — R5381 Other malaise: Secondary | ICD-10-CM | POA: Diagnosis not present

## 2020-03-17 DIAGNOSIS — Z7401 Bed confinement status: Secondary | ICD-10-CM | POA: Diagnosis not present

## 2020-03-17 DIAGNOSIS — E785 Hyperlipidemia, unspecified: Secondary | ICD-10-CM | POA: Diagnosis not present

## 2020-03-17 DIAGNOSIS — I4891 Unspecified atrial fibrillation: Secondary | ICD-10-CM | POA: Diagnosis not present

## 2020-03-17 DIAGNOSIS — H409 Unspecified glaucoma: Secondary | ICD-10-CM | POA: Diagnosis not present

## 2020-03-17 DIAGNOSIS — U071 COVID-19: Secondary | ICD-10-CM | POA: Diagnosis not present

## 2020-03-17 DIAGNOSIS — R262 Difficulty in walking, not elsewhere classified: Secondary | ICD-10-CM | POA: Diagnosis not present

## 2020-03-17 DIAGNOSIS — M6281 Muscle weakness (generalized): Secondary | ICD-10-CM | POA: Diagnosis not present

## 2020-03-17 DIAGNOSIS — K56609 Unspecified intestinal obstruction, unspecified as to partial versus complete obstruction: Secondary | ICD-10-CM | POA: Diagnosis not present

## 2020-03-17 DIAGNOSIS — J449 Chronic obstructive pulmonary disease, unspecified: Secondary | ICD-10-CM | POA: Diagnosis not present

## 2020-03-17 DIAGNOSIS — I482 Chronic atrial fibrillation, unspecified: Secondary | ICD-10-CM | POA: Diagnosis not present

## 2020-03-17 DIAGNOSIS — R531 Weakness: Secondary | ICD-10-CM | POA: Diagnosis not present

## 2020-03-17 LAB — BASIC METABOLIC PANEL
Anion gap: 8 (ref 5–15)
BUN: 6 mg/dL — ABNORMAL LOW (ref 8–23)
CO2: 22 mmol/L (ref 22–32)
Calcium: 8.7 mg/dL — ABNORMAL LOW (ref 8.9–10.3)
Chloride: 102 mmol/L (ref 98–111)
Creatinine, Ser: 0.5 mg/dL (ref 0.44–1.00)
GFR, Estimated: >60 mL/min (ref >60)
Glucose, Bld: 134 mg/dL — ABNORMAL HIGH (ref 70–99)
Potassium: 3.9 mmol/L (ref 3.5–5.1)
Sodium: 132 mmol/L — ABNORMAL LOW (ref 135–145)

## 2020-03-17 LAB — MAGNESIUM: Magnesium: 1.8 mg/dL (ref 1.7–2.4)

## 2020-03-17 MED ORDER — DILTIAZEM HCL ER COATED BEADS 300 MG PO CP24: 300.0000 mg | ORAL_CAPSULE | Freq: Every day | ORAL | 11 refills | Status: AC

## 2020-03-17 MED ORDER — THIAMINE HCL 100 MG PO TABS
100.0000 mg | ORAL_TABLET | Freq: Every day | ORAL | Status: AC
Start: 2020-03-18 — End: ?

## 2020-03-17 MED ORDER — PANTOPRAZOLE SODIUM 40 MG PO TBEC: 40.0000 mg | DELAYED_RELEASE_TABLET | Freq: Two times a day (BID) | ORAL | 1 refills | Status: AC

## 2020-03-17 MED ORDER — APIXABAN 5 MG PO TABS
5.0000 mg | ORAL_TABLET | Freq: Two times a day (BID) | ORAL | Status: AC
Start: 2020-03-18 — End: ?

## 2020-03-17 MED ORDER — DOCUSATE SODIUM 100 MG PO CAPS: 100.0000 mg | ORAL_CAPSULE | Freq: Two times a day (BID) | ORAL | 2 refills | Status: AC

## 2020-03-17 NOTE — Progress Notes (Signed)
[facility] Progress Note     Subjective: Doing well and eating soft diet. Having BMs.   Objective: Vital signs in last 24 hours: Temp:  [97.8 F (36.6 C)-98.2 F (36.8 C)] 98.2 F (36.8 C) (01/07 0524) Pulse Rate:  [89-97] 97 (01/07 0524) Resp:  [17-18] 17 (01/07 0524) BP: (116-133)/(69-75) 124/69 (01/07 1150) SpO2:  [96 %-100 %] 97 % (01/07 0909) Last BM Date: 03/15/20  Intake/Output from previous day: 01/06 0701 - 01/07 0700 In: 970 [P.O.:970] Out: -  Intake/Output this shift: No intake/output data recorded.  General appearance: alert, cooperative and no distress Resp: normal work of breathing GI: soft, nondistended, nontender  Lab Results:  Recent Labs    03/15/20 0513  WBC 6.1  HGB 9.8*  HCT 29.8*  PLT 535*   BMET Recent Labs    03/16/20 0714 03/17/20 0518  NA 133* 132*  K 3.1* 3.9  CL 99 102  CO2 23 22  GLUCOSE 74 134*  BUN <5* 6*  CREATININE 0.40* 0.50  CALCIUM 8.4* 8.7*   PT/INR No results for input(s): LABPROT, INR in the last 72 hours.  Studies/Results: DG Abd Portable 1V-Small Bowel Obstruction Protocol-24 hr delay  Result Date: 03/15/2020 CLINICAL DATA:  24 hour delay study. History of small-bowel obstruction. EXAM: PORTABLE ABDOMEN - 1 VIEW COMPARISON:  03/14/2020.  CT 03/13/2020. FINDINGS: Interim removal of NG tube. No gastric distention. Surgical clips in the pelvis. Oral contrast again noted throughout the colon. Several loops of slightly dilated small bowel noted. Severe small bowel dilatation noted on prior CT of 03/13/2020 no longer identified. Colon is nondistended. No free air. Degenerative changes lumbar spine and both hips. Mild left base atelectasis. IMPRESSION: 1.  Interim removal of NG tube.  No gastric distention. 2. Oral contrast again noted in the colon. Several loops of slightly prominent small bowel noted. Previously identified severe small-bowel distention noted on CT of 03/13/2020 has resolved.  Electronically Signed   By: Maisie Fus  Register   On: 03/15/2020 14:48   DG Foot Complete Right  Result Date: 03/15/2020 CLINICAL DATA:  Foot pain with right heel wound EXAM: RIGHT FOOT COMPLETE - 3+ VIEW COMPARISON:  None. FINDINGS: Bones appear demineralized. No fracture or malalignment. No periostitis or bony destructive change. Plantar soft tissue calcification. Mild degenerative change at the first MTP joint IMPRESSION: No acute osseous abnormality. Electronically Signed   By: Jasmine Pang M.D.   On: 03/15/2020 19:17    Anti-infectives: Anti-infectives (From admission, onward)   Start     Dose/Rate Route Frequency Ordered Stop   03/15/20 0600  cefoTEtan (CEFOTAN) 2 g in sodium chloride 0.9 % 100 mL IVPB        2 g 200 mL/hr over 30 Minutes Intravenous On call to O.R. 03/14/20 1303 03/16/20 0559      Assessment/Plan: Samantha Huerta is a 75 yo with resolving SBO. Doing well. Soft diet Colace PRN follow up   Going to SNF.    LOS: 4 days    Samantha Huerta 03/17/2020

## 2020-03-17 NOTE — TOC Transition Note (Signed)
Transition of Care [facility]) - CM/SW Discharge Note   Patient Details  Name: Samantha Huerta MRN: [id number] Date of Birth: [birth year]  Transition of Care [facility] CM/SW Contact:  Samantha Gault, LCSW Phone Number: 03/17/2020, 12:17 PM   Clinical Narrative:     Pt stable for dc today per MD. Pt has insurance auth. Followed up with pt's daughter, Samantha Huerta, on bed offers. [name] requests [employer] to [facility]. Notified Jill Side at [facility] and they can accept pt today. DC clinical sent electronically. RN to call report. EMS arranged.  There are no other TOC needs identified for dc.    Barriers to Discharge: Barriers Resolved   Patient Goals and CMS Choice Patient states their goals for this hospitalization and ongoing recovery are:: return to SNF   Choice offered to / list presented to : Patient  Discharge Placement   Existing PASRR number confirmed : 03/14/20          Patient chooses bed at: [facility] Patient to be transferred to facility by: EMS Name of family member notified: Samantha Huerta Patient and family notified of of transfer: 03/17/20  Discharge Plan and Services In-house Referral: Clinical Social Work   Post Acute Care Choice: Skilled Nursing Facility          DME Arranged: N/A DME Agency: NA       HH Arranged: NA HH Agency: NA        Social Determinants of Health (SDOH) Interventions     Readmission Risk Interventions Readmission Risk Prevention Plan 03/17/2020 03/08/2020 02/29/2020  Transportation Screening Complete - Complete  HRI or Home Care Consult Complete - Complete  Social Work Consult for Recovery Care Planning/Counseling Complete - Complete  Palliative Care Screening Complete Complete Not Applicable  Medication Review Oceanographer) Complete - Complete  Some recent data might be hidden

## 2020-03-17 NOTE — Discharge Summary (Addendum)
Physician Discharge Summary  Samantha Huerta ASN:[id number] DOB: [birth year] DOA: 03/13/2020  PCP: Sharion Balloon, FNP  Admit date: 03/13/2020 Discharge date: 03/17/2020  Disposition:  SNF   Recommendations for Outpatient Follow-up:  1. Follow up with PCP in 1 weeks 2. Follow up with cardiology clinic Dr. Domenic Polite in 3 weeks 3. Please obtain BMP/CBC/Magnesium in one week 4. Outpatient palliative care consult recommended  Discharge Condition: STABLE   CODE STATUS: FULL DIET: SOFT DIET ONLY RECOMMENDED    Brief Hospitalization Summary: Please see all hospital notes, images, labs for full details of the hospitalization. ADMISSION HPI: Samantha Huerta is a 75 y.o. female with medical history significant for COPD, hypertension, dyslipidemia, recent onset atrial fibrillation, alcohol use disorder with associated possible dementia, and recent admission for small bowel obstruction he was discharged on 12/31 after conservative management.  She presents back to the emergency department today with worsening nausea and vomiting that began at approximately 3 PM yesterday afternoon.  She was given some Zofran without relief of her symptoms and she began vomiting again this morning and coffee-ground emesis and therefore was brought to the ED via EMS.  Patient states that she has not had anything to eat in the last 2 or 3 days since discharge due to her nausea and abdominal pain.  She states her abdominal pain is generalized.  She states she did have a bowel movement yesterday that was nonbloody.  Denies any passage of flatus.  She denies any fevers or chills, chest pain, or shortness of breath.  She states that her last alcohol use was over 6 weeks ago and has not had anything to drink recently.   ED Course: Vital signs are stable and patient is otherwise afebrile.  Mild leukocytosis of 11,600 noted but hemoglobin is 11.6 and appears stable.  Glucose is 65.  Potassium is 3 and patient has been started on some  supplementation.  She has been given some potassium and a fluid bolus as well as PPI and dextrose.  General surgery Dr. Constance Haw has been contacted by EDP with recommendation to place NG tube to low intermittent suction with further evaluation pending.  Hospital Course   Small bowel obstruction with intractable nausea and vomiting/hematemesis -Appreciate general surgery consultation - fortunately SBO resolved with conservative measures and did not require surgery however if recurs would likely need exploration -Recent discharge on 12/31 after conservative management -She is responding to conservative management again and is now having bowel movements -Small bowel follow-through shows resolution of small bowel obstruction -NG tube has been removed as you can start on clear liquids, advance as tolerated -Repleted potassium and magnesium as noted below -Maintained on PPI twice daily and monitor hemoglobin trend -Hematemesis likely related to Mallory-Weiss tear in the setting of nausea and vomiting as well as Eliquis use,consider GI consultation if continues to persist - RESOLVED NOW  Hypokalemia and Hypomagnesemia - REPLETED  Alcoholic dementia/recurrent alcohol use -Noted to have severe alcohol withdrawal delirium during prior admission requiring Precedex infusion but NOT THIS ADMISSION -No signs of withdrawal currently noted -Monitored closely with CIWA protocol -Last alcoholic drink was over 6 weeks ago according to patient -She was having some confusion which may be related to hospital delirium  Recent new onset atrial fibrillation with RVR-currently in sinus rhythm -2D echocardiogram within normal limits and TSH within normal limits recently -resume home oral Cardizem and apixaban  History of COPD -No active flare noted -Continue home medications with DuoNebs as needed  DVT prophylaxis:  SCDs Code Status: Full Family Communication: Discussed with daughter Disposition Plan:  SNF   Status is: Inpatient  Discharge Diagnoses:  Active Problems:   Small bowel obstruction (HCC)   Pressure injury of skin  Discharge Instructions:  Allergies as of 03/17/2020   No Known Allergies     Medication List    STOP taking these medications   amoxicillin-clavulanate 875-125 MG tablet Commonly known as: Augmentin   HYDROcodone-acetaminophen 5-325 MG tablet Commonly known as: Norco   loperamide 2 MG tablet Commonly known as: Imodium A-D   naloxone 4 MG/0.1ML Liqd nasal spray kit Commonly known as: NARCAN   Potassium Chloride ER 20 MEQ Tbcr   pravastatin 40 MG tablet Commonly known as: PRAVACHOL     TAKE these medications   acetaminophen 500 MG tablet Commonly known as: TYLENOL Take 500 mg by mouth every 6 (six) hours as needed for moderate pain or headache.   albuterol 108 (90 Base) MCG/ACT inhaler Commonly known as: VENTOLIN HFA Inhale 2 puffs into the lungs every 4 (four) hours as needed for wheezing or shortness of breath.   apixaban 5 MG Tabs tablet Commonly known as: ELIQUIS Take 1 tablet (5 mg total) by mouth 2 (two) times daily. Start taking on: March 18, 2020   brinzolamide 1 % ophthalmic suspension Commonly known as: AZOPT Place 1 drop into both eyes 2 (two) times daily.   diltiazem 300 MG 24 hr capsule Commonly known as: Cardizem CD Take 1 capsule (300 mg total) by mouth daily. Start taking on: March 18, 2020   docusate sodium 100 MG capsule Commonly known as: Colace Take 1 capsule (100 mg total) by mouth 2 (two) times daily.   feeding supplement Liqd Take 237 mLs by mouth 3 (three) times daily between meals.   Fluticasone-Salmeterol 250-50 MCG/DOSE Aepb Commonly known as: ADVAIR Inhale 1 puff into the lungs in the morning and at bedtime.   latanoprost 0.005 % ophthalmic solution Commonly known as: XALATAN Place 1 drop into both eyes at bedtime.   multivitamin with minerals tablet Take 1 tablet by mouth daily.    ondansetron 4 MG tablet Commonly known as: Zofran Take 1 tablet (4 mg total) by mouth every 8 (eight) hours as needed for nausea or vomiting.   pantoprazole 40 MG tablet Commonly known as: Protonix Take 1 tablet (40 mg total) by mouth 2 (two) times daily before a meal.   rosuvastatin 5 MG tablet Commonly known as: CRESTOR Take 5 mg by mouth daily.   thiamine 100 MG tablet Take 1 tablet (100 mg total) by mouth daily. Start taking on: March 18, 2020       Contact information for after-discharge care    [facility] .   Service: Skilled Nursing Contact information: [address] [address] [phone]                 No Known Allergies Allergies as of 03/17/2020   No Known Allergies     Medication List    STOP taking these medications   amoxicillin-clavulanate 875-125 MG tablet Commonly known as: Augmentin   HYDROcodone-acetaminophen 5-325 MG tablet Commonly known as: Norco   loperamide 2 MG tablet Commonly known as: Imodium A-D   naloxone 4 MG/0.1ML Liqd nasal spray kit Commonly known as: NARCAN   Potassium Chloride ER 20 MEQ Tbcr   pravastatin 40 MG tablet Commonly known as: PRAVACHOL     TAKE these medications  acetaminophen 500 MG tablet Commonly known as: TYLENOL Take 500 mg by mouth every 6 (six) hours as needed for moderate pain or headache.   albuterol 108 (90 Base) MCG/ACT inhaler Commonly known as: VENTOLIN HFA Inhale 2 puffs into the lungs every 4 (four) hours as needed for wheezing or shortness of breath.   apixaban 5 MG Tabs tablet Commonly known as: ELIQUIS Take 1 tablet (5 mg total) by mouth 2 (two) times daily. Start taking on: March 18, 2020   brinzolamide 1 % ophthalmic suspension Commonly known as: AZOPT Place 1 drop into both eyes 2 (two) times daily.   diltiazem 300 MG 24 hr capsule Commonly known as: Cardizem CD Take 1 capsule (300 mg total) by mouth  daily. Start taking on: March 18, 2020   docusate sodium 100 MG capsule Commonly known as: Colace Take 1 capsule (100 mg total) by mouth 2 (two) times daily.   feeding supplement Liqd Take 237 mLs by mouth 3 (three) times daily between meals.   Fluticasone-Salmeterol 250-50 MCG/DOSE Aepb Commonly known as: ADVAIR Inhale 1 puff into the lungs in the morning and at bedtime.   latanoprost 0.005 % ophthalmic solution Commonly known as: XALATAN Place 1 drop into both eyes at bedtime.   multivitamin with minerals tablet Take 1 tablet by mouth daily.   ondansetron 4 MG tablet Commonly known as: Zofran Take 1 tablet (4 mg total) by mouth every 8 (eight) hours as needed for nausea or vomiting.   pantoprazole 40 MG tablet Commonly known as: Protonix Take 1 tablet (40 mg total) by mouth 2 (two) times daily before a meal.   rosuvastatin 5 MG tablet Commonly known as: CRESTOR Take 5 mg by mouth daily.   thiamine 100 MG tablet Take 1 tablet (100 mg total) by mouth daily. Start taking on: March 18, 2020       Procedures/Studies: CT ABDOMEN PELVIS WO CONTRAST  Result Date: 02/28/2020 CLINICAL DATA:  Nausea and vomiting for several days EXAM: CT ABDOMEN AND PELVIS WITHOUT CONTRAST TECHNIQUE: Multidetector CT imaging of the abdomen and pelvis was performed following the standard protocol without IV contrast. COMPARISON:  None. FINDINGS: Lower chest: No acute abnormality. Hepatobiliary: No focal liver abnormality is seen. No gallstones, gallbladder wall thickening, or biliary dilatation. Pancreas: Unremarkable. No pancreatic ductal dilatation or surrounding inflammatory changes. Spleen: Normal in size without focal abnormality. Adrenals/Urinary Tract: Adrenal glands are within normal limits. Kidneys are well visualized bilaterally. No renal calculi or obstructive changes are seen. Bladder is decompressed. Stomach/Bowel: Colon shows diverticular change without evidence of obstructive or  inflammatory change. Colon is predominately decompressed although dense material is noted within the colon which may be related to ingested material. The appendix is not visualized and surgical clips are noted likely related to prior removal. The stomach is significantly distended with fluid as is the small bowel throughout the jejunum in into the proximal ileum. There is a transition zone identified in the mid abdomen best seen on image number 44 of series 2 and image number 41 of series 5. This is likely related to adhesions as no definitive mass is seen. The more distal small bowel is unremarkable. Vascular/Lymphatic: Aortic atherosclerosis. No enlarged abdominal or pelvic lymph nodes. Reproductive: Status post hysterectomy. No adnexal masses. Other: No abdominal wall hernia or abnormality. No abdominopelvic ascites. Musculoskeletal: Degenerative changes of lumbar spine are noted. No acute compression deformity is noted. IMPRESSION: High-grade small bowel obstruction likely related to adhesions in the proximal ileum. More distal small  bowel and colon are decompressed. No other focal abnormality is noted. Electronically Signed   By: Alcide Clever M.D.   On: 02/28/2020 11:10   DG Abd 1 View  Result Date: 03/05/2020 CLINICAL DATA:  Small-bowel obstruction EXAM: ABDOMEN - 1 VIEW COMPARISON:  03/03/2000 FINDINGS: Nasogastric tube in the decompressed stomach. Small bowel and colon appear decompressed. Surgical clips in the right pelvis. Multilevel lumbar spondylitic change. IMPRESSION: Nasogastric tube in the stomach. Electronically Signed   By: Corlis Leak M.D.   On: 03/05/2020 08:17   DG Abd 1 View  Result Date: 03/01/2020 CLINICAL DATA:  Small bowel obstruction EXAM: ABDOMEN - 1 VIEW COMPARISON:  CT 2 days ago FINDINGS: Ongoing small bowel obstruction with dilated loops in the central abdomen measuring up to 3.8 cm. The degree of distension is improved, but no new colonic gas is seen. No gross  pneumoperitoneum. Clear lung bases. IMPRESSION: Ongoing small bowel obstruction with improved small bowel distension. Electronically Signed   By: Marnee Spring M.D.   On: 03/01/2020 07:44   CT ABDOMEN PELVIS W CONTRAST  Result Date: 03/13/2020 CLINICAL DATA:  Diffuse abdominal pain and vomiting/hematemesis EXAM: CT ABDOMEN AND PELVIS WITH CONTRAST TECHNIQUE: Multidetector CT imaging of the abdomen and pelvis was performed using the standard protocol following bolus administration of intravenous contrast. CONTRAST:  62mL OMNIPAQUE IOHEXOL 300 MG/ML  SOLN COMPARISON:  February 28, 2020 FINDINGS: Lower chest: There are pleural effusions bilaterally with bibasilar atelectasis/consolidation. Scattered foci of coronary artery calcification noted. Hepatobiliary: There is evident hepatic steatosis. No focal liver lesions are appreciable. No appreciable biliary dilatation. Pancreas: No pancreatic mass or inflammatory focus. Spleen: No splenic lesions are evident. Adrenals/Urinary Tract: Adrenals bilaterally appear normal. There is a cyst in the mid right kidney posteriorly measuring 0.8 x 0.8 cm. No evident hydronephrosis on either side. There is no renal or ureteral calculus on either side. Stomach/Bowel: There is distension of the stomach with fluid and to a lesser extent air. There is severe dilatation of small bowel to the level of a transition zone in the right upper pelvis, at the proximal to mid ileal level. More distally in the ileum, there are loops of thickened bowel wall. The terminal ileum appears essentially unremarkable. There is no free air or portal venous air. There are colonic diverticula without diverticulitis. There is no appreciable colonic dilatation or colonic wall thickening evident. Vascular/Lymphatic: There is no abdominal aortic aneurysm. There is aortic and iliac artery atherosclerosis as well as several foci of pelvic arterial vascular calcification. Major venous structures appear patent. No  adenopathy appreciable in the abdomen or pelvis. Reproductive: Uterus absent. No adnexal masses. A small amount of fluid is noted in the rightward aspect of the dependent portion of the pelvis. Other: Appendix not seen. Surgical clip in expected periappendiceal region area. No inflammation in this area. No abscess evident. No ascites appreciable beyond the mild fluid in the rightward aspect of the dependent portion of the pelvis. Musculoskeletal: There is degenerative change in the lower thoracic and lumbar regions. Spinal stenosis is evident at L3-4 due to disc protrusion and bony hypertrophy. Spinal stenosis also noted at L4-5 and L5-S1 due to similar factors. No blastic or lytic bone lesions. There is degenerative change in each hip joint with subchondral cysts in each hip joint, more severe on the right than on the left. No intramuscular or abdominal wall lesions. IMPRESSION: 1. Bowel obstruction with transition zone in the proximal to mid ileal region. Wall thickening and several loops  of more distal ileum which may represent underlying enteritis. Colonic diverticula noted without colonic diverticulitis. No colonic dilatation. Stomach severely distended with fluid and to a lesser extent air. No free air evident. 2. No abscess evident in the abdomen or pelvis. Mild ascites in the dependent portion the pelvis toward the right may be of reactive response to the degree of bowel inflammation and bowel obstruction. 3. Pleural effusions bilaterally with atelectasis and probable superimposed infiltrate in the lung bases. 4. Aortic Atherosclerosis (ICD10-I70.0). Atherosclerotic plaque in multiple pelvic arterial vessels as well as in the foci of coronary artery calcification. 5. Spinal stenosis at L3-4, L4-5, and L5-S1 due to combination of disc protrusion and bony hypertrophy. 6.  Hepatic steatosis. Electronically Signed   By: Lowella Grip III M.D.   On: 03/13/2020 11:08   DG Chest Portable 1 View  Result  Date: 03/13/2020 CLINICAL DATA:  Nasogastric placement EXAM: PORTABLE CHEST 1 VIEW COMPARISON:  02/28/2020 FINDINGS: Nasogastric tube enters the stomach with its tip in the mid body. There is mild to moderate atelectasis or patchy infiltrate in the left lower lobe. Minimal atelectasis at the right base. No evidence of heart failure or effusion. IMPRESSION: 1. Nasogastric tube tip in the mid body of the stomach. 2. Atelectasis or patchy infiltrate in the left lower lobe. Electronically Signed   By: Nelson Chimes M.D.   On: 03/13/2020 13:44   DG Chest Port 1 View  Result Date: 02/28/2020 CLINICAL DATA:  75 year old female status post central line placement. EXAM: PORTABLE CHEST 1 VIEW COMPARISON:  Chest radiograph dated 04/05/2016 and CT dated 07/08/2019. FINDINGS: Right IJ central venous line with tip over central SVC. Enteric tube with tip in the distal stomach. There is background of emphysema. No focal consolidation, pleural effusion or pneumothorax. The cardiac silhouette is within limits. Atherosclerotic calcification of the aorta. Osteopenia with degenerative changes of the spine and shoulders. Old healed right hip fractures. No acute osseous pathology. IMPRESSION: Right IJ central venous line with tip over central SVC. Electronically Signed   By: Anner Crete M.D.   On: 02/28/2020 18:46   DG ABD ACUTE 2+V W 1V CHEST  Result Date: 03/05/2020 CLINICAL DATA:  Small-bowel obstruction, nausea, weakness EXAM: DG ABDOMEN ACUTE WITH 1 VIEW CHEST COMPARISON:  Earlier abdominal radiograph 0800 hours, chest radiograph 02/29/2020 FINDINGS: Nasogastric tube extends into stomach. RIGHT jugular line tip projecting over SVC. Normal heart size, mediastinal contours, and pulmonary vascularity. Atherosclerotic calcification aorta. Atelectasis versus consolidation LEFT lower lobe new since prior study. Small LEFT pleural effusion. Underlying emphysematous changes. No pneumothorax. Nonobstructive bowel gas pattern. No  bowel dilatation or wall thickening. No free air. Bones demineralized with BILATERAL chronic rotator cuff tears and degenerative disc disease changes of the thoracic/lumbar spine. IMPRESSION: Nonobstructive bowel gas pattern. Atelectasis versus consolidation LEFT lower lobe new since 02/28/2020 with small associated LEFT pleural effusion. Electronically Signed   By: Lavonia Dana M.D.   On: 03/05/2020 13:04   DG ABD ACUTE 2+V W 1V CHEST  Result Date: 03/03/2020 CLINICAL DATA:  Small-bowel obstruction, COPD, hypertension, smoker EXAM: DG ABDOMEN ACUTE WITH 1 VIEW CHEST COMPARISON:  03/02/2020 FINDINGS: Tip of RIGHT jugular line projects over SVC. Nasogastric tube extends into stomach. Normal heart size, mediastinal contours, and pulmonary vascularity. Severe aorta Emphysematous and bronchitic changes consistent with COPD. Atelectasis versus consolidation LEFT lower lobe slightly increased. Remaining lungs free of acute infiltrate. No pleural effusion or pneumothorax. Single prominent small bowel loop in the mid abdomen. Questionable gastric wall  thickening versus artifact from underdistention. No bowel wall thickening or free air. Multilevel degenerative disc disease changes lumbar spine. Degenerative changes RIGHT hip joint. Surgical clips in pelvis. IMPRESSION: COPD changes with atelectasis versus consolidation LEFT lower lobe. Single prominent small bowel loop in the mid abdomen with decreased small bowel distension since previous study. Questionable gastric wall thickening versus artifact from underdistention. Electronically Signed   By: Ulyses Southward M.D.   On: 03/03/2020 09:47   DG ABD ACUTE 2+V W 1V CHEST  Result Date: 03/02/2020 CLINICAL DATA:  Small-bowel obstruction. EXAM: DG ABDOMEN ACUTE WITH 1 VIEW CHEST COMPARISON:  March 01, 2020. FINDINGS: Mild left basilar opacities with partial silhouetting of the left hemidiaphragm. No visible pleural effusions or pneumothorax. Stable cardiomediastinal  silhouette. Enteric tube courses below the diaphragm with the tip in the distal stomach/peripyloric region, unchanged. Right central venous catheter with the tip projecting in the region of the superior cavoatrial junction. Slight increase in small bowel dilation with small bowel measuring up to 4 cm in the left lower abdomen. No other interval change. No gross pneumoperitoneum on this supine radiograph. Surgical clips projecting over the lower abdomen/pelvis. Polyarticular degenerative change. IMPRESSION: 1. Slight increase in small bowel dilation. 2. Mild left basilar opacities, which may represent atelectasis, aspiration, and/or pneumonia. Electronically Signed   By: Feliberto Harts MD   On: 03/02/2020 08:20   DG Abdomen Acute W/Chest  Result Date: 02/28/2020 CLINICAL DATA:  Abdominal pain, hypotension, vomiting EXAM: DG ABDOMEN ACUTE WITH 1 VIEW CHEST COMPARISON:  CT 07/08/2019 FINDINGS: Heart size and mediastinal contours are within normal limits. Aortic Atherosclerosis (ICD10-170.0). Lungs hyperinflated with chronic coarse attenuated bronchovascular markings. No focal infiltrate. No free air. Gas distended small bowel loops in the lower abdomen, left greater than right, only slightly increased since previous abdominal radiograph 03/20/2017. Surgical clips in the lower pelvis. The colon is decompressed. There are no abnormal calcifications. Multilevel lumbar spondylitic change. No fracture or worrisome bone lesion. IMPRESSION: 1. Small bowel distention suggesting  ileus or obstruction. 2. No free air. Electronically Signed   By: Corlis Leak M.D.   On: 02/28/2020 09:46   DG Abd Portable 1V-Small Bowel Obstruction Protocol-24 hr delay  Result Date: 03/15/2020 CLINICAL DATA:  24 hour delay study. History of small-bowel obstruction. EXAM: PORTABLE ABDOMEN - 1 VIEW COMPARISON:  03/14/2020.  CT 03/13/2020. FINDINGS: Interim removal of NG tube. No gastric distention. Surgical clips in the pelvis. Oral  contrast again noted throughout the colon. Several loops of slightly dilated small bowel noted. Severe small bowel dilatation noted on prior CT of 03/13/2020 no longer identified. Colon is nondistended. No free air. Degenerative changes lumbar spine and both hips. Mild left base atelectasis. IMPRESSION: 1.  Interim removal of NG tube.  No gastric distention. 2. Oral contrast again noted in the colon. Several loops of slightly prominent small bowel noted. Previously identified severe small-bowel distention noted on CT of 03/13/2020 has resolved. Electronically Signed   By: Maisie Fus  Register   On: 03/15/2020 14:48   DG Abd Portable 1V-Small Bowel Obstruction Protocol-initial, 8 hr delay  Result Date: 03/14/2020 CLINICAL DATA:  Small-bowel obstruction EXAM: PORTABLE ABDOMEN - 1 VIEW COMPARISON:  03/14/2020 FINDINGS: Supine frontal view of the abdomen and pelvis was obtained 8 hours after oral contrast administration. Oral contrast is seen throughout the colon to the level of the rectum. No evidence of bowel obstruction. No masses or abnormal calcifications. Enteric catheter tip and side port project over the gastric fundus. IMPRESSION: 1. Progression  of oral contrast into the colon by 8 hours. No evidence of small-bowel obstruction. Electronically Signed   By: Randa Ngo M.D.   On: 03/14/2020 22:50   DG Abd Portable 1V-Small Bowel Protocol-Position Verification  Result Date: 03/14/2020 CLINICAL DATA:  Nasogastric tube placement EXAM: PORTABLE ABDOMEN - 1 VIEW COMPARISON:  Chest x-ray obtained yesterday; prior abdominal radiograph 03/05/2020 FINDINGS: A nasogastric tube is present. The tip projects over the gastric body. The bowel gas pattern does not appear obstructed. Left-sided pleural effusion with associated left lower lobe atelectasis. No acute osseous abnormality. IMPRESSION: 1. The nasogastric tube projects over the stomach in good position. 2. Marked interval reduction in small bowel distension. 3.  Small left pleural effusion and associated left basilar atelectasis. Electronically Signed   By: Jacqulynn Cadet M.D.   On: 03/14/2020 14:28   DG Abd Portable 1V-Small Bowel Obstruction Protocol-initial, 8 hr delay  Result Date: 03/01/2020 CLINICAL DATA:  75 year old female with small bowel obstruction. 8 hour delayed image. EXAM: PORTABLE ABDOMEN - 1 VIEW COMPARISON:  CT of the abdomen pelvis dated 02/28/2020 abdominal radiograph dated 03/01/2020 FINDINGS: Enteric tube with tip in the distal stomach. Improved dilatation of the small bowel compared to the prior radiograph now measuring up to 3 cm in the left lower abdomen. No other interval change. IMPRESSION: Improved dilatation of the small bowel compared to prior radiograph. Continued follow-up recommended. Electronically Signed   By: Anner Crete M.D.   On: 03/01/2020 22:29   DG Foot Complete Right  Result Date: 03/15/2020 CLINICAL DATA:  Foot pain with right heel wound EXAM: RIGHT FOOT COMPLETE - 3+ VIEW COMPARISON:  None. FINDINGS: Bones appear demineralized. No fracture or malalignment. No periostitis or bony destructive change. Plantar soft tissue calcification. Mild degenerative change at the first MTP joint IMPRESSION: No acute osseous abnormality. Electronically Signed   By: Donavan Foil M.D.   On: 03/15/2020 19:17   [place] EKG SITE RITE  Result Date: 02/28/2020 If Site Rite image not attached, placement could not be confirmed due to current cardiac rhythm.    Subjective: Pt has been tolerating diet well and having bowel movements.   Discharge Exam: Vitals:   03/17/20 0909 03/17/20 1150  BP:  124/69  Pulse:    Resp:    Temp:    SpO2: 97%    Vitals:   03/16/20 2108 03/17/20 0524 03/17/20 0909 03/17/20 1150  BP: 133/75 116/70  124/69  Pulse: 89 97    Resp: 18 17    Temp: 97.8 F (36.6 C) 98.2 F (36.8 C)    TempSrc:      SpO2: 100% 98% 97%   Weight:      Height:       General: Pt is awake, not in acute  distress Cardiovascular: normal S1/S2 +, no rubs, no gallops Respiratory: CTA bilaterally, no wheezing, no rhonchi Abdominal: Soft, NT, ND, bowel sounds + Extremities: no edema, no cyanosis   The results of significant diagnostics from this hospitalization (including imaging, microbiology, ancillary and laboratory) are listed below for reference.     Microbiology: Recent Results (from the past 240 hour(s))  Resp Panel by RT-PCR (Flu A&B, Covid) Nasopharyngeal Swab     Status: None   Collection Time: 03/09/20 12:23 PM   Specimen: Nasopharyngeal Swab; Nasopharyngeal(NP) swabs in vial transport medium  Result Value Ref Range Status   SARS Coronavirus 2 by RT PCR NEGATIVE NEGATIVE Final    Comment: (NOTE) SARS-CoV-2 target nucleic acids are NOT DETECTED.  The SARS-CoV-2 RNA is generally detectable in upper respiratory specimens during the acute phase of infection. The lowest concentration of SARS-CoV-2 viral copies this assay can detect is 138 copies/mL. A negative result does not preclude SARS-Cov-2 infection and should not be used as the sole basis for treatment or other patient management decisions. A negative result may occur with  improper specimen collection/handling, submission of specimen other than nasopharyngeal swab, presence of viral mutation(s) within the areas targeted by this assay, and inadequate number of viral copies(<138 copies/mL). A negative result must be combined with clinical observations, patient history, and epidemiological information. The expected result is Negative.  Fact Sheet for Patients:  EntrepreneurPulse.com.au  Fact Sheet for Healthcare Providers:  IncredibleEmployment.be  This test is no t yet approved or cleared by the [place] FDA and  has been authorized for detection and/or diagnosis of SARS-CoV-2 by FDA under an Emergency Use Authorization (EUA). This EUA will remain  in effect (meaning this test  can be used) for the duration of the COVID-19 declaration under Section 564(b)(1) of the Act, 21 U.S.C.section 360bbb-3(b)(1), unless the authorization is terminated  or revoked sooner.       Influenza A by PCR NEGATIVE NEGATIVE Final   Influenza B by PCR NEGATIVE NEGATIVE Final    Comment: (NOTE) The Xpert Xpress SARS-CoV-2/FLU/RSV plus assay is intended as an aid in the diagnosis of influenza from Nasopharyngeal swab specimens and should not be used as a sole basis for treatment. Nasal washings and aspirates are unacceptable for Xpert Xpress SARS-CoV-2/FLU/RSV testing.  Fact Sheet for Patients: EntrepreneurPulse.com.au  Fact Sheet for Healthcare Providers: IncredibleEmployment.be  This test is not yet approved or cleared by the [place] FDA and has been authorized for detection and/or diagnosis of SARS-CoV-2 by FDA under an Emergency Use Authorization (EUA). This EUA will remain in effect (meaning this test can be used) for the duration of the COVID-19 declaration under Section 564(b)(1) of the Act, 21 U.S.C. section 360bbb-3(b)(1), unless the authorization is terminated or revoked.  Performed at [facility], [address], [place], [place] [address]   SARS CORONAVIRUS 2 (TAT 6-24 HRS) Nasopharyngeal Nasopharyngeal Swab     Status: None   Collection Time: 03/13/20 12:44 PM   Specimen: Nasopharyngeal Swab  Result Value Ref Range Status   SARS Coronavirus 2 NEGATIVE NEGATIVE Final    Comment: (NOTE) SARS-CoV-2 target nucleic acids are NOT DETECTED.  The SARS-CoV-2 RNA is generally detectable in upper and lower respiratory specimens during the acute phase of infection. Negative results do not preclude SARS-CoV-2 infection, do not rule out co-infections with other pathogens, and should not be used as the sole basis for treatment or other patient management decisions. Negative results must be combined with clinical  observations, patient history, and epidemiological information. The expected result is Negative.  Fact Sheet for Patients: SugarRoll.be  Fact Sheet for Healthcare Providers: https://www.woods-mathews.com/  This test is not yet approved or cleared by the [place] FDA and  has been authorized for detection and/or diagnosis of SARS-CoV-2 by FDA under an Emergency Use Authorization (EUA). This EUA will remain  in effect (meaning this test can be used) for the duration of the COVID-19 declaration under Se ction 564(b)(1) of the Act, 21 U.S.C. section 360bbb-3(b)(1), unless the authorization is terminated or revoked sooner.  Performed at [facility], [place] [address], [place], [place] [address]   Surgical PCR screen     Status: None   Collection Time: 03/14/20  8:20 PM  Specimen: Nasal Mucosa; Nasal Swab  Result Value Ref Range Status   MRSA, PCR NEGATIVE NEGATIVE Final   Staphylococcus aureus NEGATIVE NEGATIVE Final    Comment: (NOTE) The Xpert SA Assay (FDA approved for NASAL specimens in patients 3 years of age and older), is one component of a comprehensive surveillance program. It is not intended to diagnose infection nor to guide or monitor treatment. Performed at [facility], [address], [place], [place] [address]      Labs: BNP (last 3 results) No results for input(s): BNP in the last 8760 hours. Basic Metabolic Panel: Recent Labs  Lab 03/13/20 0916 03/14/20 0505 03/15/20 0513 03/16/20 0714 03/17/20 0518  NA 138 138 135 133* 132*  K 3.0* 3.0* 2.8* 3.1* 3.9  CL 100 104 104 99 102  CO2 $Re'23 24 24 23 22  'pma$ GLUCOSE 65* 96 95 74 134*  BUN 10 8 5* <5* 6*  CREATININE 0.63 0.46 0.44 0.40* 0.50  CALCIUM 8.9 8.3* 8.1* 8.4* 8.7*  MG 1.6* 1.7 1.5* 1.6* 1.8   Liver Function Tests: Recent Labs  Lab 03/13/20 0916 03/14/20 0505 03/15/20 0513  AST $Re'24 17 15  'ZPC$ ALT $R'27 19 17  'KS$ ALKPHOS 93 71 72  BILITOT 0.7 0.6 0.4  PROT  6.2* 5.3* 5.1*  ALBUMIN 3.0* 2.4* 2.3*   Recent Labs  Lab 03/13/20 0916  LIPASE 34   No results for input(s): AMMONIA in the last 168 hours. CBC: Recent Labs  Lab 03/13/20 0916 03/14/20 0505 03/15/20 0513  WBC 11.6* 6.1 6.1  NEUTROABS 9.7*  --   --   HGB 11.6* 10.2* 9.8*  HCT 36.6 30.7* 29.8*  MCV 104.0* 101.3* 100.0  PLT 563* 537* 535*   Cardiac Enzymes: No results for input(s): CKTOTAL, CKMB, CKMBINDEX, TROPONINI in the last 168 hours. BNP: Invalid input(s): POCBNP CBG: Recent Labs  Lab 03/13/20 1229 03/14/20 1209  GLUCAP 92 79   D-Dimer No results for input(s): DDIMER in the last 72 hours. Hgb A1c No results for input(s): HGBA1C in the last 72 hours. Lipid Profile No results for input(s): CHOL, HDL, LDLCALC, TRIG, CHOLHDL, LDLDIRECT in the last 72 hours. Thyroid function studies No results for input(s): TSH, T4TOTAL, T3FREE, THYROIDAB in the last 72 hours.  Invalid input(s): FREET3 Anemia work up No results for input(s): VITAMINB12, FOLATE, FERRITIN, TIBC, IRON, RETICCTPCT in the last 72 hours. Urinalysis    Component Value Date/Time   COLORURINE YELLOW 03/13/2020 1845   APPEARANCEUR CLEAR 03/13/2020 1845   APPEARANCEUR Clear 03/20/2017 1112   LABSPEC >1.046 (H) 03/13/2020 1845   PHURINE 6.0 03/13/2020 1845   GLUCOSEU NEGATIVE 03/13/2020 1845   HGBUR NEGATIVE 03/13/2020 1845   BILIRUBINUR NEGATIVE 03/13/2020 1845   BILIRUBINUR Negative 03/20/2017 1112   KETONESUR 20 (A) 03/13/2020 1845   PROTEINUR NEGATIVE 03/13/2020 1845   UROBILINOGEN negative 01/25/2014 1529   NITRITE NEGATIVE 03/13/2020 1845   LEUKOCYTESUR NEGATIVE 03/13/2020 1845   Sepsis Labs Invalid input(s): PROCALCITONIN,  WBC,  LACTICIDVEN Microbiology Recent Results (from the past 240 hour(s))  Resp Panel by RT-PCR (Flu A&B, Covid) Nasopharyngeal Swab     Status: None   Collection Time: 03/09/20 12:23 PM   Specimen: Nasopharyngeal Swab; Nasopharyngeal(NP) swabs in vial transport  medium  Result Value Ref Range Status   SARS Coronavirus 2 by RT PCR NEGATIVE NEGATIVE Final    Comment: (NOTE) SARS-CoV-2 target nucleic acids are NOT DETECTED.  The SARS-CoV-2 RNA is generally detectable in upper respiratory specimens during the acute phase of infection.  The lowest concentration of SARS-CoV-2 viral copies this assay can detect is 138 copies/mL. A negative result does not preclude SARS-Cov-2 infection and should not be used as the sole basis for treatment or other patient management decisions. A negative result may occur with  improper specimen collection/handling, submission of specimen other than nasopharyngeal swab, presence of viral mutation(s) within the areas targeted by this assay, and inadequate number of viral copies(<138 copies/mL). A negative result must be combined with clinical observations, patient history, and epidemiological information. The expected result is Negative.  Fact Sheet for Patients:  EntrepreneurPulse.com.au  Fact Sheet for Healthcare Providers:  IncredibleEmployment.be  This test is no t yet approved or cleared by the [place] FDA and  has been authorized for detection and/or diagnosis of SARS-CoV-2 by FDA under an Emergency Use Authorization (EUA). This EUA will remain  in effect (meaning this test can be used) for the duration of the COVID-19 declaration under Section 564(b)(1) of the Act, 21 U.S.C.section 360bbb-3(b)(1), unless the authorization is terminated  or revoked sooner.       Influenza A by PCR NEGATIVE NEGATIVE Final   Influenza B by PCR NEGATIVE NEGATIVE Final    Comment: (NOTE) The Xpert Xpress SARS-CoV-2/FLU/RSV plus assay is intended as an aid in the diagnosis of influenza from Nasopharyngeal swab specimens and should not be used as a sole basis for treatment. Nasal washings and aspirates are unacceptable for Xpert Xpress SARS-CoV-2/FLU/RSV testing.  Fact Sheet for  Patients: EntrepreneurPulse.com.au  Fact Sheet for Healthcare Providers: IncredibleEmployment.be  This test is not yet approved or cleared by the [place] FDA and has been authorized for detection and/or diagnosis of SARS-CoV-2 by FDA under an Emergency Use Authorization (EUA). This EUA will remain in effect (meaning this test can be used) for the duration of the COVID-19 declaration under Section 564(b)(1) of the Act, 21 U.S.C. section 360bbb-3(b)(1), unless the authorization is terminated or revoked.  Performed at [facility], [address] [place], [place] [address]   SARS CORONAVIRUS 2 (TAT 6-24 HRS) Nasopharyngeal Nasopharyngeal Swab     Status: None   Collection Time: 03/13/20 12:44 PM   Specimen: Nasopharyngeal Swab  Result Value Ref Range Status   SARS Coronavirus 2 NEGATIVE NEGATIVE Final    Comment: (NOTE) SARS-CoV-2 target nucleic acids are NOT DETECTED.  The SARS-CoV-2 RNA is generally detectable in upper and lower respiratory specimens during the acute phase of infection. Negative results do not preclude SARS-CoV-2 infection, do not rule out co-infections with other pathogens, and should not be used as the sole basis for treatment or other patient management decisions. Negative results must be combined with clinical observations, patient history, and epidemiological information. The expected result is Negative.  Fact Sheet for Patients: SugarRoll.be  Fact Sheet for Healthcare Providers: https://www.woods-mathews.com/  This test is not yet approved or cleared by the [place] FDA and  has been authorized for detection and/or diagnosis of SARS-CoV-2 by FDA under an Emergency Use Authorization (EUA). This EUA will remain  in effect (meaning this test can be used) for the duration of the COVID-19 declaration under Se ction 564(b)(1) of the Act, 21 U.S.C. section 360bbb-3(b)(1),  unless the authorization is terminated or revoked sooner.  Performed at [facility], [place] [address], [place], [place] [address]   Surgical PCR screen     Status: None   Collection Time: 03/14/20  8:20 PM   Specimen: Nasal Mucosa; Nasal Swab  Result Value Ref Range Status   MRSA,  PCR NEGATIVE NEGATIVE Final   Staphylococcus aureus NEGATIVE NEGATIVE Final    Comment: (NOTE) The Xpert SA Assay (FDA approved for NASAL specimens in patients 84 years of age and older), is one component of a comprehensive surveillance program. It is not intended to diagnose infection nor to guide or monitor treatment. Performed at [facility], [address] [place], [place] [address]    Time coordinating discharge: 35 mins  SIGNED:  Irwin Brakeman, MD  [facility] 03/17/2020, 1:35 PM How to contact the [facility] Attending or Consulting provider [place] or covering provider during after hours [place], for this patient?  1. Check the care team in [facility] and look for a) attending/consulting TR[facility] provider listed and b) the [facility] team listed 2. Log into [email or web address] and use [place]'s universal password to access. If you do not have the password, please contact the hospital operator. 3. Locate the [facility] provider you are looking for under [facility] and page to a number that you can be directly reached. 4. If you still have difficulty reaching the provider, please page the [facility] (Director on Call) for the Hospitalists listed on amion for assistance.

## 2020-03-17 NOTE — Progress Notes (Signed)
Nsg Discharge Note  Admit Date:  03/13/2020 Discharge date: 03/17/2020   [facility]on Brass to be D/C'd Skilled nursing facility per MD order.  AVS completed.  Copy for chart, and copy for patient signed, and dated. Patient/caregiver able to verbalize understanding.  Discharge Medication: Allergies as of 03/17/2020   No Known Allergies     Medication List    STOP taking these medications   amoxicillin-clavulanate 875-125 MG tablet Commonly known as: Augmentin   HYDROcodone-acetaminophen 5-325 MG tablet Commonly known as: Norco   loperamide 2 MG tablet Commonly known as: Imodium A-D   naloxone 4 MG/0.1ML Liqd nasal spray kit Commonly known as: NARCAN   Potassium Chloride ER 20 MEQ Tbcr   pravastatin 40 MG tablet Commonly known as: PRAVACHOL     TAKE these medications   acetaminophen 500 MG tablet Commonly known as: TYLENOL Take 500 mg by mouth every 6 (six) hours as needed for moderate pain or headache.   albuterol 108 (90 Base) MCG/ACT inhaler Commonly known as: VENTOLIN HFA Inhale 2 puffs into the lungs every 4 (four) hours as needed for wheezing or shortness of breath.   apixaban 5 MG Tabs tablet Commonly known as: ELIQUIS Take 1 tablet (5 mg total) by mouth 2 (two) times daily. Start taking on: March 18, 2020   brinzolamide 1 % ophthalmic suspension Commonly known as: AZOPT Place 1 drop into both eyes 2 (two) times daily.   diltiazem 300 MG 24 hr capsule Commonly known as: Cardizem CD Take 1 capsule (300 mg total) by mouth daily. Start taking on: March 18, 2020   docusate sodium 100 MG capsule Commonly known as: Colace Take 1 capsule (100 mg total) by mouth 2 (two) times daily.   feeding supplement Liqd Take 237 mLs by mouth 3 (three) times daily between meals.   Fluticasone-Salmeterol 250-50 MCG/DOSE Aepb Commonly known as: ADVAIR Inhale 1 puff into the lungs in the morning and at bedtime.   latanoprost 0.005 % ophthalmic solution Commonly known as:  XALATAN Place 1 drop into both eyes at bedtime.   multivitamin with minerals tablet Take 1 tablet by mouth daily.   ondansetron 4 MG tablet Commonly known as: Zofran Take 1 tablet (4 mg total) by mouth every 8 (eight) hours as needed for nausea or vomiting.   pantoprazole 40 MG tablet Commonly known as: Protonix Take 1 tablet (40 mg total) by mouth 2 (two) times daily before a meal.   rosuvastatin 5 MG tablet Commonly known as: CRESTOR Take 5 mg by mouth daily.   thiamine 100 MG tablet Take 1 tablet (100 mg total) by mouth daily. Start taking on: March 18, 2020       Discharge Assessment: Vitals:   03/17/20 0909 03/17/20 1150  BP:  124/69  Pulse:    Resp:    Temp:    SpO2: 97%    Skin clean, dry and intact without evidence of skin break down, no evidence of skin tears noted. IV catheter discontinued intact. Site without signs and symptoms of complications - no redness or edema noted at insertion site, patient denies c/o pain - only slight tenderness at site.  Dressing with slight pressure applied.  D/c Instructions-Education: Discharge instructions given to patient/family with verbalized understanding. D/c education completed with patient/family including follow up instructions, medication list, d/c activities limitations if indicated, with other d/c instructions as indicated by MD - patient able to verbalize understanding, all questions fully answered. Patient instructed to return to ED, call 911, or  call MD for any changes in condition.  Patient escorted via [place], and D/C home via private auto.  Dorcas Mcmurray, LPN 10/13/4625 0:35 PM

## 2020-03-17 NOTE — Care Management Important Message (Signed)
Important Message  Patient Details  Name: Samantha Huerta MRN: [id number] Date of Birth: October 20, 1945   Medicare Important Message Given:  Yes     Corey Harold 03/17/2020, 12:53 PM

## 2020-03-17 NOTE — Progress Notes (Signed)
Bathed patient and changed linen, patient appeared to be confused and stated she needed me to finish the work she needed me to do. I asked patient what the work was, she just stated "work" and that I needed to turn off the pot of coffee. I told patient I turned off the pot of coffee and that I was finished with the work. She stated that I wasn't and I told her I would be back to check on her. Patient push call light and told Secretary that she needed the work to be finished. Patient went to sleep when I went back to room to check on her.

## 2020-03-20 DIAGNOSIS — I4891 Unspecified atrial fibrillation: Secondary | ICD-10-CM | POA: Diagnosis not present

## 2020-03-20 DIAGNOSIS — K920 Hematemesis: Secondary | ICD-10-CM | POA: Diagnosis not present

## 2020-03-20 DIAGNOSIS — E785 Hyperlipidemia, unspecified: Secondary | ICD-10-CM | POA: Diagnosis not present

## 2020-03-20 DIAGNOSIS — K56609 Unspecified intestinal obstruction, unspecified as to partial versus complete obstruction: Secondary | ICD-10-CM | POA: Diagnosis not present

## 2020-03-20 DIAGNOSIS — J449 Chronic obstructive pulmonary disease, unspecified: Secondary | ICD-10-CM | POA: Diagnosis not present

## 2020-03-21 DIAGNOSIS — E785 Hyperlipidemia, unspecified: Secondary | ICD-10-CM | POA: Diagnosis not present

## 2020-03-21 DIAGNOSIS — K56609 Unspecified intestinal obstruction, unspecified as to partial versus complete obstruction: Secondary | ICD-10-CM | POA: Diagnosis not present

## 2020-03-21 DIAGNOSIS — K920 Hematemesis: Secondary | ICD-10-CM | POA: Diagnosis not present

## 2020-03-21 DIAGNOSIS — I4891 Unspecified atrial fibrillation: Secondary | ICD-10-CM | POA: Diagnosis not present

## 2020-03-21 DIAGNOSIS — J449 Chronic obstructive pulmonary disease, unspecified: Secondary | ICD-10-CM | POA: Diagnosis not present

## 2020-03-25 DIAGNOSIS — J449 Chronic obstructive pulmonary disease, unspecified: Secondary | ICD-10-CM | POA: Diagnosis not present

## 2020-03-25 DIAGNOSIS — I4891 Unspecified atrial fibrillation: Secondary | ICD-10-CM | POA: Diagnosis not present

## 2020-03-25 DIAGNOSIS — U071 COVID-19: Secondary | ICD-10-CM | POA: Diagnosis not present

## 2020-03-25 DIAGNOSIS — E785 Hyperlipidemia, unspecified: Secondary | ICD-10-CM | POA: Diagnosis not present

## 2020-04-02 DIAGNOSIS — Z7983 Long term (current) use of bisphosphonates: Secondary | ICD-10-CM | POA: Diagnosis not present

## 2020-04-02 DIAGNOSIS — Z7901 Long term (current) use of anticoagulants: Secondary | ICD-10-CM | POA: Diagnosis not present

## 2020-04-02 DIAGNOSIS — Z20822 Contact with and (suspected) exposure to covid-19: Secondary | ICD-10-CM | POA: Diagnosis not present

## 2020-04-02 DIAGNOSIS — E869 Volume depletion, unspecified: Secondary | ICD-10-CM | POA: Diagnosis not present

## 2020-04-02 DIAGNOSIS — K6389 Other specified diseases of intestine: Secondary | ICD-10-CM | POA: Diagnosis not present

## 2020-04-02 DIAGNOSIS — R1084 Generalized abdominal pain: Secondary | ICD-10-CM | POA: Diagnosis not present

## 2020-04-02 DIAGNOSIS — Z4682 Encounter for fitting and adjustment of non-vascular catheter: Secondary | ICD-10-CM | POA: Diagnosis not present

## 2020-04-02 DIAGNOSIS — K529 Noninfective gastroenteritis and colitis, unspecified: Secondary | ICD-10-CM | POA: Diagnosis not present

## 2020-04-02 DIAGNOSIS — K56600 Partial intestinal obstruction, unspecified as to cause: Secondary | ICD-10-CM | POA: Diagnosis not present

## 2020-04-02 DIAGNOSIS — Z452 Encounter for adjustment and management of vascular access device: Secondary | ICD-10-CM | POA: Diagnosis not present

## 2020-04-02 DIAGNOSIS — M25511 Pain in right shoulder: Secondary | ICD-10-CM | POA: Diagnosis not present

## 2020-04-02 DIAGNOSIS — K219 Gastro-esophageal reflux disease without esophagitis: Secondary | ICD-10-CM | POA: Diagnosis not present

## 2020-04-02 DIAGNOSIS — J440 Chronic obstructive pulmonary disease with acute lower respiratory infection: Secondary | ICD-10-CM | POA: Diagnosis not present

## 2020-04-02 DIAGNOSIS — R2231 Localized swelling, mass and lump, right upper limb: Secondary | ICD-10-CM | POA: Diagnosis not present

## 2020-04-02 DIAGNOSIS — Z681 Body mass index (BMI) 19 or less, adult: Secondary | ICD-10-CM | POA: Diagnosis not present

## 2020-04-02 DIAGNOSIS — R109 Unspecified abdominal pain: Secondary | ICD-10-CM | POA: Diagnosis not present

## 2020-04-02 DIAGNOSIS — K5900 Constipation, unspecified: Secondary | ICD-10-CM | POA: Diagnosis not present

## 2020-04-02 DIAGNOSIS — E162 Hypoglycemia, unspecified: Secondary | ICD-10-CM | POA: Diagnosis not present

## 2020-04-02 DIAGNOSIS — Z9889 Other specified postprocedural states: Secondary | ICD-10-CM | POA: Diagnosis not present

## 2020-04-02 DIAGNOSIS — F17210 Nicotine dependence, cigarettes, uncomplicated: Secondary | ICD-10-CM | POA: Diagnosis not present

## 2020-04-02 DIAGNOSIS — Z66 Do not resuscitate: Secondary | ICD-10-CM | POA: Diagnosis not present

## 2020-04-02 DIAGNOSIS — J439 Emphysema, unspecified: Secondary | ICD-10-CM | POA: Diagnosis not present

## 2020-04-02 DIAGNOSIS — R636 Underweight: Secondary | ICD-10-CM | POA: Diagnosis not present

## 2020-04-02 DIAGNOSIS — K5939 Other megacolon: Secondary | ICD-10-CM | POA: Diagnosis not present

## 2020-04-02 DIAGNOSIS — Z743 Need for continuous supervision: Secondary | ICD-10-CM | POA: Diagnosis not present

## 2020-04-02 DIAGNOSIS — K56609 Unspecified intestinal obstruction, unspecified as to partial versus complete obstruction: Secondary | ICD-10-CM | POA: Diagnosis not present

## 2020-04-02 DIAGNOSIS — R079 Chest pain, unspecified: Secondary | ICD-10-CM | POA: Diagnosis not present

## 2020-04-02 DIAGNOSIS — E876 Hypokalemia: Secondary | ICD-10-CM | POA: Diagnosis not present

## 2020-04-02 DIAGNOSIS — E785 Hyperlipidemia, unspecified: Secondary | ICD-10-CM | POA: Diagnosis not present

## 2020-04-02 DIAGNOSIS — I10 Essential (primary) hypertension: Secondary | ICD-10-CM | POA: Diagnosis not present

## 2020-04-02 DIAGNOSIS — J449 Chronic obstructive pulmonary disease, unspecified: Secondary | ICD-10-CM | POA: Diagnosis not present

## 2020-04-02 DIAGNOSIS — R197 Diarrhea, unspecified: Secondary | ICD-10-CM | POA: Diagnosis not present

## 2020-04-03 DIAGNOSIS — I10 Essential (primary) hypertension: Secondary | ICD-10-CM | POA: Diagnosis not present

## 2020-04-03 DIAGNOSIS — J449 Chronic obstructive pulmonary disease, unspecified: Secondary | ICD-10-CM | POA: Diagnosis not present

## 2020-04-03 DIAGNOSIS — E876 Hypokalemia: Secondary | ICD-10-CM | POA: Diagnosis not present

## 2020-04-03 DIAGNOSIS — K56609 Unspecified intestinal obstruction, unspecified as to partial versus complete obstruction: Secondary | ICD-10-CM | POA: Diagnosis not present

## 2020-04-03 DIAGNOSIS — E785 Hyperlipidemia, unspecified: Secondary | ICD-10-CM | POA: Diagnosis not present

## 2020-04-03 DIAGNOSIS — Z9889 Other specified postprocedural states: Secondary | ICD-10-CM | POA: Diagnosis not present

## 2020-04-04 DIAGNOSIS — K56600 Partial intestinal obstruction, unspecified as to cause: Secondary | ICD-10-CM | POA: Diagnosis not present

## 2020-04-04 DIAGNOSIS — Z9889 Other specified postprocedural states: Secondary | ICD-10-CM | POA: Diagnosis not present

## 2020-04-04 DIAGNOSIS — Z4682 Encounter for fitting and adjustment of non-vascular catheter: Secondary | ICD-10-CM | POA: Diagnosis not present

## 2020-04-04 DIAGNOSIS — Z452 Encounter for adjustment and management of vascular access device: Secondary | ICD-10-CM | POA: Diagnosis not present

## 2020-04-04 DIAGNOSIS — I10 Essential (primary) hypertension: Secondary | ICD-10-CM | POA: Diagnosis not present

## 2020-04-04 DIAGNOSIS — E785 Hyperlipidemia, unspecified: Secondary | ICD-10-CM | POA: Diagnosis not present

## 2020-04-04 DIAGNOSIS — K56609 Unspecified intestinal obstruction, unspecified as to partial versus complete obstruction: Secondary | ICD-10-CM | POA: Diagnosis not present

## 2020-04-04 DIAGNOSIS — J449 Chronic obstructive pulmonary disease, unspecified: Secondary | ICD-10-CM | POA: Diagnosis not present

## 2020-04-04 DIAGNOSIS — E876 Hypokalemia: Secondary | ICD-10-CM | POA: Diagnosis not present

## 2020-04-05 DIAGNOSIS — K56609 Unspecified intestinal obstruction, unspecified as to partial versus complete obstruction: Secondary | ICD-10-CM | POA: Diagnosis not present

## 2020-04-05 DIAGNOSIS — R2231 Localized swelling, mass and lump, right upper limb: Secondary | ICD-10-CM | POA: Diagnosis not present

## 2020-04-05 DIAGNOSIS — M25511 Pain in right shoulder: Secondary | ICD-10-CM | POA: Diagnosis not present

## 2020-04-10 NOTE — Progress Notes (Unsigned)
Cardiology Office Note  Date: 04/11/2020   ID: Samantha, Huerta 02/16/1946, MRN [id number]  PCP:  [name], FNP  Cardiologist:  Nona Dell, MD Electrophysiologist:  None   Chief Complaint: Cardiac follow up  History of Present Illness: Samantha Huerta is a 75 y.o. female with a history of COPD, hyperlipidemia, hypertension, GERD, bowel obstruction, shortness of breath, sinus tachycardia, coronary artery calcification.  Last encounter with Dr. Diona Browner 02/01/2020.  She did not specifically report any sense of palpitations.  Did have some dyspnea on exertion which had been stable.  Noted her heart rate has been elevated when checking her vital signs on automatic cuff at home.  On Dr. Ival Bible review of the chart of her heart rate have been elevated for least the last year at various health care checks.  History of multivessel coronary artery calcification by CT in April.  Stable small benign-appearing pulmonary nodules.  She was on statin therapy and most recent LDL was 82.  Most recent lab work including TSH and hemoglobin were normal.  Not on any stimulant medications.  Using an albuterol MDI as needed.  Zio patch 72-hour monitor ordered to get  heart rate variability and echocardiogram to exclude cardiomyopathy were ordered.  She was continue Norvasc and lisinopril for hypertension.  Continuing Pravachol.  Admission for new onset Afib and aspiration pneumonia 02/28/2020. She presented with several weeks of n/v,  abdominal pain. CT in ER showed high grade SBO,. Was hypotensive with AKI. Hospitalization complicated by hypovolemic shock requiring Levophed, alcohol withdrawal, and severe delirium. She was discharged on Eliquis 5 mg po bid and diltiazem 300 mg po daily. Amlodipine 5 mg , asa 81 mg, Lisinopril 40 mg, omeprazole 20 mg and oxycodone 5/325 mg were stopped at d/c. She was to have follow up CBC in one month after d/c on 03/10/2020.  She presented to ED on 03/13/2020.  With nausea, vomiting coffee ground material the day prior. She was having continued n/v and abdominal pain. She was admitted with SBO which resolved. She was discharged on 03/17/2020. She was to follow up with cardiology in 3 weeks.  Had a subsequent presentation to [facility] 04/02/2020 with 1 to 3-day history of abdominal pain.  History of SBO managed nonoperatively.  Discharge diagnosis was recurrent SBO resolved.  Continue conservative management.     Past Medical History:  Diagnosis Date  . Anxiety   . Arthritis   . COPD (chronic obstructive pulmonary disease) (HCC)   . GERD (gastroesophageal reflux disease)   . Glaucoma 1998   Dr Carlynn Purl in [facility]  . Hyperlipidemia   . Hypertension   . Osteopenia     Past Surgical History:  Procedure Laterality Date  . ABDOMINAL HYSTERECTOMY    . CARPAL TUNNEL RELEASE Bilateral   . COLONOSCOPY WITH PROPOFOL N/A 10/20/2017   Procedure: COLONOSCOPY WITH PROPOFOL;  Surgeon: Corbin Ade, MD;  Location: [facility] SU[facility];  Service: Endoscopy;  Laterality: N/A;  1:45pm  . ELBOW SURGERY Right   . SHOULDER SURGERY Left     Current Outpatient Medications  Medication Sig Dispense Refill  . acetaminophen (TYLENOL) 500 MG tablet Take 500 mg by mouth every 6 (six) hours as needed for moderate pain or headache.    . albuterol (VENTOLIN HFA) 108 (90 Base) MCG/ACT inhaler Inhale 2 puffs into the lungs every 4 (four) hours as needed for wheezing or shortness of breath. 8.5 g 11  . apixaban (ELIQUIS) 5 MG TABS  tablet Take 1 tablet (5 mg total) by mouth 2 (two) times daily. 60 tablet   . brinzolamide (AZOPT) 1 % ophthalmic suspension Place 1 drop into both eyes 2 (two) times daily.    Marland Kitchen diltiazem (CARDIZEM CD) 300 MG 24 hr capsule Take 1 capsule (300 mg total) by mouth daily. 30 capsule 11  . docusate sodium (COLACE) 100 MG capsule Take 1 capsule (100 mg total) by mouth 2 (two) times daily. 60 capsule 2  . feeding supplement (ENSURE ENLIVE / ENSURE  PLUS) LIQD Take 237 mLs by mouth 3 (three) times daily between meals. 237 mL 12  . Fluticasone-Salmeterol (ADVAIR) 250-50 MCG/DOSE AEPB Inhale 1 puff into the lungs in the morning and at bedtime. 60 each 5  . latanoprost (XALATAN) 0.005 % ophthalmic solution Place 1 drop into both eyes at bedtime.    . Multiple Vitamins-Minerals (MULTIVITAMIN WITH MINERALS) tablet Take 1 tablet by mouth daily.    . ondansetron (ZOFRAN) 4 MG tablet Take 1 tablet (4 mg total) by mouth every 8 (eight) hours as needed for nausea or vomiting. 20 tablet 0  . pantoprazole (PROTONIX) 40 MG tablet Take 1 tablet (40 mg total) by mouth 2 (two) times daily before a meal. 30 tablet 1  . rosuvastatin (CRESTOR) 5 MG tablet Take 5 mg by mouth daily.    Marland Kitchen thiamine 100 MG tablet Take 1 tablet (100 mg total) by mouth daily.     No current facility-administered medications for this visit.   Allergies:  Patient has no known allergies.   Social History: The patient  reports that she has been smoking cigarettes. She has a 26.50 pack-year smoking history. She has never used smokeless tobacco. She reports previous alcohol use of about 5.0 standard drinks of alcohol per week. She reports that she does not use drugs.   Family History: The patient's family history includes Breast cancer (age of onset: 33) in her sister; Diabetes in her sister; Glaucoma in her father, sister, sister, sister, and sister; Hip fracture in her mother; Other in her brother.   ROS:  Please see the history of present illness. Otherwise, complete review of systems is positive for {NONE DEFAULTED:18576::"none"}.  All other systems are reviewed and negative.   Physical Exam: VS:  There were no vitals taken for this visit., BMI There is no height or weight on file to calculate BMI.  Wt Readings from Last 3 Encounters:  03/13/20 97 lb 14.2 oz (44.4 kg)  03/07/20 124 lb 1.9 oz (56.3 kg)  02/01/20 103 lb (46.7 kg)    General: Patient appears comfortable at  rest. HEENT: Conjunctiva and lids normal, oropharynx clear with moist mucosa. Neck: Supple, no elevated JVP or carotid bruits, no thyromegaly. Lungs: Clear to auscultation, nonlabored breathing at rest. Cardiac: Regular rate and rhythm, no S3 or significant systolic murmur, no pericardial rub. Abdomen: Soft, nontender, no hepatomegaly, bowel sounds present, no guarding or rebound. Extremities: No pitting edema, distal pulses 2+. Skin: Warm and dry. Musculoskeletal: No kyphosis. Neuropsychiatric: Alert and oriented x3, affect grossly appropriate.  ECG:  {EKG/Telemetry Strips Reviewed:[phone]}  Recent Labwork: 03/04/2020: TSH 5.007 03/15/2020: ALT 17; AST 15; Hemoglobin 9.8; Platelets 535 03/17/2020: BUN 6; Creatinine, Ser 0.50; Magnesium 1.8; Potassium 3.9; Sodium 132     Component Value Date/Time   CHOL 212 (H) 03/25/2019 1039   CHOL 163 07/27/2012 1731   TRIG 80 03/06/2020 0728   TRIG 99 01/25/2014 1547   TRIG 58 07/27/2012 1731   HDL 119 03/25/2019  1039   HDL 72 01/25/2014 1547   HDL 80 07/27/2012 1731   CHOLHDL 1.8 03/25/2019 1039   LDLCALC 82 03/25/2019 1039   LDLCALC 58 05/11/2013 1500   LDLCALC 71 07/27/2012 1731    Other Studies Reviewed Today:  Echocardiogram 02/08/2020 1. Left ventricular ejection fraction, by estimation, is 55 to 60%. The left ventricle has normal function. The left ventricle has no regional wall motion abnormalities. Left ventricular diastolic parameters are indeterminate. 2. Right ventricular systolic function is normal. The right ventricular size is normal. Tricuspid regurgitation signal is inadequate for assessing PA pressure. 3. The mitral valve is abnormal, mildly thickened and calcified with restricted posterior leaflet motion. Trivial mitral valve regurgitation. 4. The aortic valve is tricuspid. Aortic valve regurgitation is mild. 5. The inferior vena cava is normal in size with greater than 50% respiratory variability, suggesting right  atrial pressure of 3 mmHg's   Cardiac monitor 02/21/2020 Study Highlights ZIO AT reviewed.  3 days 7 hours analyzed.  Predominant rhythm is sinus with heart rate ranging from 72 bpm up to 137 bpm and average heart rate 98 bpm.  Rare PVCs including couplets and triplets were noted representing less than 1% total beats.  There were also rare PVCs including couplets and triplets representing less than 1% total beats.  Brief episodes of ventricular bigeminy and trigeminy were noted.  There were no sustained arrhythmias or pauses.   Chest CT 07/08/2019: IMPRESSION: 1. No acute cardiopulmonary abnormalities. 2. Stable small pulmonary nodules measuring up to 4 mm. No new suspicious lung nodules identified. Imaging findings are compatible with a benign process. These nodules require no further follow-up. This recommendation follows the consensus statement: Guidelines for Management of Small Pulmonary Nodules Detected on CT Images: From the Fleischner Society 2017; Radiology 2017; 284:228-243. 3. Multi vessel coronary artery atherosclerotic calcifications noted.  Aortic Atherosclerosis (ICD10-I70.0).    Assessment and Plan:  1. Atrial fibrillation, unspecified type (HCC)   2. Sinus tachycardia   3. Coronary artery calcification seen on CT scan   4. Essential hypertension      Medication Adjustments/Labs and Tests Ordered: Current medicines are reviewed at length with the patient today.  Concerns regarding medicines are outlined above.   Disposition: Follow-up with ***  Signed, Rennis Harding, NP 04/11/2020 1:29 PM    [facility] [address] [place], [place], [place] [address] Phone: [phone]; Fax: [phone]

## 2020-04-11 ENCOUNTER — Ambulatory Visit: Payer: Medicare Other | Admitting: Family Medicine

## 2020-04-11 DIAGNOSIS — I10 Essential (primary) hypertension: Secondary | ICD-10-CM

## 2020-04-11 DIAGNOSIS — R000 Tachycardia, unspecified: Secondary | ICD-10-CM

## 2020-04-11 DIAGNOSIS — I2510 Atherosclerotic heart disease of native coronary artery without angina pectoris: Secondary | ICD-10-CM

## 2020-04-13 ENCOUNTER — Other Ambulatory Visit: Payer: Self-pay | Admitting: Family

## 2020-04-14 ENCOUNTER — Telehealth: Payer: Self-pay | Admitting: Family Medicine

## 2020-04-14 NOTE — Telephone Encounter (Signed)
Can not all in fluid medication, because was in hospital for dehydration. Lasix can increase dehydration. I recommend keeping feet elevated, strict low salt diet, and compression hose. She needs an office visit for lab work.

## 2020-04-17 NOTE — Telephone Encounter (Signed)
Called patient no answer no voice mail.  

## 2020-04-18 ENCOUNTER — Other Ambulatory Visit: Payer: Self-pay

## 2020-04-18 ENCOUNTER — Ambulatory Visit (HOSPITAL_COMMUNITY)
Admission: RE | Admit: 2020-04-18 | Discharge: 2020-04-18 | Disposition: A | Discharge status: Home / Self Care | Payer: Medicare Other | Source: Ambulatory Visit | Attending: Family | Admitting: Family

## 2020-04-18 DIAGNOSIS — R928 Other abnormal and inconclusive findings on diagnostic imaging of breast: Secondary | ICD-10-CM | POA: Diagnosis not present

## 2020-04-18 DIAGNOSIS — N6489 Other specified disorders of breast: Secondary | ICD-10-CM | POA: Diagnosis not present

## 2020-04-20 ENCOUNTER — Ambulatory Visit (INDEPENDENT_AMBULATORY_CARE_PROVIDER_SITE_OTHER): Payer: Medicare Other | Admitting: Family

## 2020-04-20 ENCOUNTER — Encounter: Payer: Self-pay | Admitting: Family

## 2020-04-20 ENCOUNTER — Other Ambulatory Visit: Payer: Self-pay

## 2020-04-20 VITALS — BP 114/69 | HR 115 | Temp 97.1°F | Ht 65.0 in | Wt 93.4 lb

## 2020-04-20 DIAGNOSIS — Z0289 Encounter for other administrative examinations: Secondary | ICD-10-CM | POA: Diagnosis not present

## 2020-04-20 DIAGNOSIS — J449 Chronic obstructive pulmonary disease, unspecified: Secondary | ICD-10-CM | POA: Diagnosis not present

## 2020-04-20 DIAGNOSIS — I10 Essential (primary) hypertension: Secondary | ICD-10-CM

## 2020-04-20 DIAGNOSIS — J4489 Other specified chronic obstructive pulmonary disease: Secondary | ICD-10-CM

## 2020-04-20 DIAGNOSIS — E43 Unspecified severe protein-calorie malnutrition: Secondary | ICD-10-CM | POA: Diagnosis not present

## 2020-04-20 DIAGNOSIS — E782 Mixed hyperlipidemia: Secondary | ICD-10-CM

## 2020-04-20 DIAGNOSIS — G8929 Other chronic pain: Secondary | ICD-10-CM

## 2020-04-20 DIAGNOSIS — M545 Low back pain, unspecified: Secondary | ICD-10-CM | POA: Diagnosis not present

## 2020-04-20 DIAGNOSIS — F1120 Opioid dependence, uncomplicated: Secondary | ICD-10-CM

## 2020-04-20 DIAGNOSIS — K219 Gastro-esophageal reflux disease without esophagitis: Secondary | ICD-10-CM

## 2020-04-20 MED ORDER — HYDROCODONE-ACETAMINOPHEN 5-325 MG PO TABS: 1.0000 | ORAL_TABLET | Freq: Four times a day (QID) | ORAL | 0 refills | Status: AC | PRN

## 2020-04-20 MED ORDER — HYDROCODONE-ACETAMINOPHEN 5-325 MG PO TABS: 1.0000 | ORAL_TABLET | Freq: Two times a day (BID) | ORAL | 0 refills | Status: AC | PRN

## 2020-04-20 NOTE — Patient Instructions (Signed)
https://doi.[email or web address]/[email or web address]">  Chronic Back Pain When back pain lasts longer than 3 months, it is called chronic back pain. The cause of your back pain may not be known. Some common causes include:  Wear and tear (degenerative disease) of the bones, ligaments, or disks in your back.  Inflammation and stiffness in your back (arthritis). People who have chronic back pain often go through certain periods in which the pain is more intense (flare-ups). Many people can learn to manage the pain with home care. Follow these instructions at home: Pay attention to any changes in your symptoms. Take these actions to help with your pain: Managing pain and stiffness  If directed, apply ice to the painful area. Your health care provider may recommend applying ice during the first 24-48 hours after a flare-up begins. To do this: ? Put ice in a plastic bag. ? Place a towel between your skin and the bag. ? Leave the ice on for 20 minutes, 2-3 times per day.  If directed, apply heat to the affected area as often as told by your health care provider. Use the heat source that your health care provider recommends, such as a moist heat pack or a heating pad. ? Place a towel between your skin and the heat source. ? Leave the heat on for 20-30 minutes. ? Remove the heat if your skin turns bright red. This is especially important if you are unable to feel pain, heat, or cold. You may have a greater risk of getting burned.  Try soaking in a warm tub.      Activity  Avoid bending and other activities that make the problem worse.  Maintain a proper position when standing or sitting: ? When standing, keep your upper back and neck straight, with your shoulders pulled back. Avoid slouching. ? When sitting, keep your back straight and relax your shoulders. Do not round your shoulders or pull them backward.  Do not sit or stand in one place for long periods of time.  Take brief periods of rest  throughout the day. This will reduce your pain. Resting in a lying or standing position is usually better than sitting to rest.  When you are resting for longer periods, mix in some mild activity or stretching between periods of rest. This will help to prevent stiffness and pain.  Get regular exercise. Ask your health care provider what activities are safe for you.  Do not lift anything that is heavier than 10 lb (4.5 kg), or the limit that you are told, until your health care provider says that it is safe. Always use proper lifting technique, which includes: ? Bending your knees. ? Keeping the load close to your body. ? Avoiding twisting.  Sleep on a firm mattress in a comfortable position. Try lying on your side with your knees slightly bent. If you lie on your back, put a pillow under your knees.   Medicines  Treatment may include medicines for pain and inflammation taken by mouth or applied to the skin, prescription pain medicine, or muscle relaxants. Take over-the-counter and prescription medicines only as told by your health care provider.  Ask your health care provider if the medicine prescribed to you: ? Requires you to avoid driving or using machinery. ? Can cause constipation. You may need to take these actions to prevent or treat constipation:  Drink enough fluid to keep your urine pale yellow.  Take over-the-counter or prescription medicines.  Eat foods that are high in fiber, such as   beans, whole grains, and fresh fruits and vegetables.  Limit foods that are high in fat and processed sugars, such as fried or sweet foods. General instructions  Do not use any products that contain nicotine or tobacco, such as cigarettes, e-cigarettes, and chewing tobacco. If you need help quitting, ask your health care provider.  Keep all follow-up visits as told by your health care provider. This is important. Contact a health care provider if:  You have pain that is not relieved with  rest or medicine.  Your pain gets worse, or you have new pain.  You have a high fever.  You have rapid weight loss.  You have trouble doing your normal activities. Get help right away if:  You have weakness or numbness in one or both of your legs or feet.  You have trouble controlling your bladder or your bowels.  You have severe back pain and have any of the following: ? Nausea or vomiting. ? Pain in your abdomen. ? Shortness of breath or you faint. Summary  Chronic back pain is back pain that lasts longer than 3 months.  When a flare-up begins, apply ice to the painful area for the first 24-48 hours.  Apply a moist heat pad or use a heating pad on the painful area as directed by your health care provider.  When you are resting for longer periods, mix in some mild activity or stretching between periods of rest. This will help to prevent stiffness and pain. This information is not intended to replace advice given to you by your health care provider. Make sure you discuss any questions you have with your health care provider. Document Revised: 04/07/2019 Document Reviewed: 04/07/2019 Elsevier Patient Education  2021 Elsevier Inc.  

## 2020-04-20 NOTE — Progress Notes (Signed)
Subjective:    Patient ID: Samantha Huerta, female    DOB: 06-28-1945, 75 y.o.   MRN: [id number]  Chief Complaint  Patient presents with  . Medical Management of Chronic Issues   Pt presents to the office today for chronic follow up. Since our last visit she has been in the hospital for small bowel obstruction and then discharged to a SNF for rehab. She reports she is doing better.  Gastroesophageal Reflux She complains of belching and heartburn. She reports no chest pain. This is a chronic problem. The current episode started more than 1 year ago. The problem occurs occasionally. The problem has been waxing and waning. She has tried a PPI for the symptoms.  Hypertension This is a chronic problem. The current episode started more than 1 year ago. The problem has been waxing and waning since onset. The problem is controlled. Associated symptoms include malaise/fatigue. Pertinent negatives include no chest pain, peripheral edema or shortness of breath. Risk factors for coronary artery disease include dyslipidemia. The current treatment provides moderate improvement.  Hyperlipidemia This is a chronic problem. The current episode started more than 1 year ago. The problem is controlled. Recent lipid tests were reviewed and are normal. Pertinent negatives include no chest pain or shortness of breath. Current antihyperlipidemic treatment includes statins. The current treatment provides moderate improvement of lipids. Risk factors for coronary artery disease include dyslipidemia, hypertension, a sedentary lifestyle and post-menopausal.  Back Pain This is a chronic problem. The current episode started more than 1 year ago. The problem occurs intermittently. The problem has been waxing and waning since onset. The pain is present in the lumbar spine. The pain is at a severity of 8/10. The pain is moderate. Pertinent negatives include no chest pain, paresis or paresthesias. She has tried analgesics and bed  rest for the symptoms. The treatment provided mild relief.   COPD Pt using the Advair BID and using the albuterol as needed. Quit smoking when she went to the hospital and has not started back. "I'm trying not to start back".    Review of Systems  Constitutional: Positive for malaise/fatigue.  Respiratory: Negative for shortness of breath.   Cardiovascular: Negative for chest pain.  Gastrointestinal: Positive for heartburn.  Musculoskeletal: Positive for back pain.  Neurological: Negative for paresthesias.  All other systems reviewed and are negative.      Objective:   Physical Exam Vitals reviewed.  Constitutional:      General: She is not in acute distress.    Appearance: She is well-developed and well-nourished.     Comments: Underweight   HENT:     Head: Normocephalic and atraumatic.     Right Ear: Tympanic membrane normal.     Left Ear: Tympanic membrane normal.     Mouth/Throat:     Mouth: Oropharynx is clear and moist.  Eyes:     Pupils: Pupils are equal, round, and reactive to light.  Neck:     Thyroid: No thyromegaly.  Cardiovascular:     Rate and Rhythm: Normal rate and regular rhythm.     Pulses: Intact distal pulses.     Heart sounds: Normal heart sounds. No murmur heard.   Pulmonary:     Effort: Pulmonary effort is normal. No respiratory distress.     Breath sounds: Normal breath sounds. No wheezing.  Abdominal:     General: Bowel sounds are normal. There is no distension.     Palpations: Abdomen is soft.  Tenderness: There is no abdominal tenderness.  Musculoskeletal:        General: No tenderness or edema. Normal range of motion.     Cervical back: Normal range of motion and neck supple.  Skin:    General: Skin is warm and dry.  Neurological:     Mental Status: She is alert and oriented to person, place, and time.     Cranial Nerves: No cranial nerve deficit.     Deep Tendon Reflexes: Reflexes are normal and symmetric.  Psychiatric:         Mood and Affect: Mood and affect normal.        Behavior: Behavior normal.        Thought Content: Thought content normal.        Judgment: Judgment normal.       BP 114/69   Pulse (!) 115   Temp (!) 97.1 F (36.2 C) (Temporal)   Ht $R'5\' 5"'Iq$  (1.651 m)   Wt 93 lb 6.4 oz (42.4 kg)   SpO2 99%   BMI 15.54 kg/m      Assessment & Plan:  KAMMIE SCIOLI comes in today with chief complaint of Medical Management of Chronic Issues   Diagnosis and orders addressed:  1. Primary hypertension - AMB Referral to [place] with Differential/Platelet  2. COPD (chronic obstructive pulmonary disease) with chronic bronchitis (HCC) - AMB Referral to [place] with Differential/Platelet - TSH  3. Gastroesophageal reflux disease without esophagitis - AMB Referral to [place] with Differential/Platelet - TSH  4. Uncomplicated opioid dependence ([place]) - AMB Referral to [place] with Differential/Platelet - HYDROcodone-acetaminophen (NORCO) 5-325 MG tablet; Take 1 tablet by mouth 2 (two) times daily as needed for moderate pain.  Dispense: 45 tablet; Refill: 0 - HYDROcodone-acetaminophen (NORCO) 5-325 MG tablet; Take 1 tablet by mouth every 6 (six) hours as needed for moderate pain.  Dispense: 45 tablet; Refill: 0 - HYDROcodone-acetaminophen (NORCO) 5-325 MG tablet; Take 1 tablet by mouth every 12 (twelve) hours as needed for moderate pain.  Dispense: 45 tablet; Refill: 0  5. Pain management contract signed - AMB Referral to [place] with Differential/Platelet  6. Mixed hyperlipidemia - AMB Referral to [place] with Differential/Platelet  7. Chronic midline low back pain without sciatica - AMB Referral to [place] with  Differential/Platelet - HYDROcodone-acetaminophen (NORCO) 5-325 MG tablet; Take 1 tablet by mouth 2 (two) times daily as needed for moderate pain.  Dispense: 45 tablet; Refill: 0 - HYDROcodone-acetaminophen (NORCO) 5-325 MG tablet; Take 1 tablet by mouth every 6 (six) hours as needed for moderate pain.  Dispense: 45 tablet; Refill: 0 - HYDROcodone-acetaminophen (NORCO) 5-325 MG tablet; Take 1 tablet by mouth every 12 (twelve) hours as needed for moderate pain.  Dispense: 45 tablet; Refill: 0  8. Unspecified severe protein-calorie malnutrition ([place]) - AMB Referral to [place] with Differential/Platelet  9. Encounter for chronic pain management - AMB Referral to [facility] - CMP14+EGFR - CBC with Differential/Platelet - HYDROcodone-acetaminophen (NORCO) 5-325 MG tablet; Take 1 tablet by mouth 2 (two) times daily as needed for moderate pain.  Dispense: 45 tablet; Refill: 0 - HYDROcodone-acetaminophen (NORCO) 5-325 MG tablet; Take 1 tablet by mouth every 6 (six) hours as needed for moderate pain.  Dispense: 45 tablet; Refill: 0 - HYDROcodone-acetaminophen (NORCO) 5-325 MG tablet; Take 1 tablet by mouth every 12 (twelve) hours as needed for moderate pain.  Dispense: 45 tablet; Refill: 0   Labs pending Patient reviewed in [place] controlled database, no flags noted. Contract and drug screen are up to date.  Health Maintenance reviewed Diet and exercise encouraged  Follow up plan: 3 months   Evelina Dun, FNP

## 2020-04-21 LAB — CMP14+EGFR
ALT: 14 IU/L (ref 0–32)
AST: 22 IU/L (ref 0–40)
Albumin/Globulin Ratio: 1.7 (ref 1.2–2.2)
Albumin: 3.8 g/dL (ref 3.7–4.7)
Alkaline Phosphatase: 90 IU/L (ref 44–121)
BUN/Creatinine Ratio: 15 (ref 12–28)
BUN: 7 mg/dL — ABNORMAL LOW (ref 8–27)
Bilirubin Total: 0.3 mg/dL (ref 0.0–1.2)
CO2: 20 mmol/L (ref 20–29)
Calcium: 9.2 mg/dL (ref 8.7–10.3)
Chloride: 95 mmol/L — ABNORMAL LOW (ref 96–106)
Creatinine, Ser: 0.48 mg/dL — ABNORMAL LOW (ref 0.57–1.00)
GFR calc Af Amer: 110 mL/min/{1.73_m2} (ref >59)
GFR calc non Af Amer: 96 mL/min/{1.73_m2} (ref >59)
Globulin, Total: 2.2 g/dL (ref 1.5–4.5)
Glucose: 102 mg/dL — ABNORMAL HIGH (ref 65–99)
Potassium: 4.5 mmol/L (ref 3.5–5.2)
Sodium: 133 mmol/L — ABNORMAL LOW (ref 134–144)
Total Protein: 6 g/dL (ref 6.0–8.5)

## 2020-04-21 LAB — CBC WITH DIFFERENTIAL/PLATELET
Basophils Absolute: 0.1 10*3/uL (ref 0.0–0.2)
Basos: 1 %
EOS (ABSOLUTE): 0.1 10*3/uL (ref 0.0–0.4)
Eos: 1 %
Hematocrit: 34.5 % (ref 34.0–46.6)
Hemoglobin: 11.4 g/dL (ref 11.1–15.9)
Immature Grans (Abs): 0 10*3/uL (ref 0.0–0.1)
Immature Granulocytes: 0 %
Lymphocytes Absolute: 1 10*3/uL (ref 0.7–3.1)
Lymphs: 12 %
MCH: 30.6 pg (ref 26.6–33.0)
MCHC: 33 g/dL (ref 31.5–35.7)
MCV: 93 fL (ref 79–97)
Monocytes Absolute: 0.8 10*3/uL (ref 0.1–0.9)
Monocytes: 10 %
Neutrophils Absolute: 6.5 10*3/uL (ref 1.4–7.0)
Neutrophils: 76 %
Platelets: 594 10*3/uL — ABNORMAL HIGH (ref 150–450)
RBC: 3.73 x10E6/uL — ABNORMAL LOW (ref 3.77–5.28)
RDW: 13.4 % (ref 11.7–15.4)
WBC: 8.5 10*3/uL (ref 3.4–10.8)

## 2020-04-21 LAB — TSH: TSH: 1.94 u[IU]/mL (ref 0.450–4.500)

## 2020-04-24 ENCOUNTER — Telehealth: Payer: Self-pay | Admitting: *Deleted

## 2020-04-24 NOTE — Chronic Care Management (AMB) (Signed)
  Chronic Care Management   Outreach Note  04/24/2020 Name: Samantha Huerta MRN: [id number] DOB: November 07, 1945  Samantha Huerta is a 75 y.o. year old female who is a primary care patient of Samantha Spencer, Samantha Huerta. I reached out to Samantha Huerta by phone today in response to a referral sent by Ms. Samantha Huerta's PCP, Samantha Spencer, Samantha Huerta  An unsuccessful telephone outreach was attempted today. The patient was referred to the case management team for assistance with care management and care coordination.   Follow Up Plan: A HIPAA compliant phone message was left for the patient providing contact information and requesting a return call. The care management team will reach out to the patient again over the next 7 days. If patient returns call to provider office, please advise to call Embedded Care Management Care Guide Samantha Huerta at [phone].  Samantha Huerta  Care Guide, Embedded Care Coordination [facility] Management

## 2020-05-02 ENCOUNTER — Ambulatory Visit (INDEPENDENT_AMBULATORY_CARE_PROVIDER_SITE_OTHER): Payer: Medicare Other

## 2020-05-02 DIAGNOSIS — Z0000 Encounter for general adult medical examination without abnormal findings: Secondary | ICD-10-CM | POA: Diagnosis not present

## 2020-05-02 NOTE — Chronic Care Management (AMB) (Signed)
  Chronic Care Management   Note  05/02/2020 Name: YUETTE PUTNAM MRN: [id number] DOB: 18-Jun-1945  SHANTAL ROAN is a 75 y.o. year old female who is a primary care patient of Sharion Balloon, FNP. I reached out to Weston Brass by phone today in response to a referral sent by Ms. [place] PCP, Sharion Balloon, FNP.   Ms. Abid was given information about Chronic Care Management services today including:  1. CCM service includes personalized support from designated clinical staff supervised by her physician, including individualized plan of care and coordination with other care providers 2. 24/7 contact phone numbers for assistance for urgent and routine care needs. 3. Service will only be billed when office clinical staff spend 20 minutes or more in a month to coordinate care. 4. Only one practitioner may furnish and bill the service in a calendar month. 5. The patient may stop CCM services at any time (effective at the end of the month) by phone call to the office staff. 6. The patient will be responsible for cost sharing (co-pay) of up to 20% of the service fee (after annual deductible is met).  Patient agreed to services and verbal consent obtained.   Follow up plan: Telephone appointment with care management team member scheduled for:05/12/2020  [facility]

## 2020-05-02 NOTE — Progress Notes (Signed)
MEDICARE ANNUAL WELLNESS VISIT  05/02/2020  Telephone Visit Disclaimer This Medicare AWV was conducted by telephone due to national recommendations for restrictions regarding the COVID-19 Pandemic (e.g. social distancing).  I verified, using two identifiers, that I am speaking with Samantha Huerta or their authorized healthcare agent. I discussed the limitations, risks, security, and privacy concerns of performing an evaluation and management service by telephone and the potential availability of an in-person appointment in the future. The patient expressed understanding and agreed to proceed.  Location of Patient: Home Location of Provider (nurse):  [facility]  Subjective:    Samantha Huerta is a 75 y.o. female patient of Hawks, [name], FNP who had a Medicare Annual Wellness Visit today via telephone. Samantha Huerta is Retired and lives alone. She has two children.and four grandchildren. She reports that she is socially active and does interact with Huerta/family regularly. She is minimally physically active and enjoys spending time with family, working word puzzles and watching television.  Patient Care Team: Samantha Spencer, FNP as PCP - General (Family Medicine) Samantha Sidle, MD as PCP - Cardiology (Cardiology) Samantha Gauss Gerrit Friends, MD as Consulting Physician (Gastroenterology) Samantha Huerta, OD (Optometry) Samantha Daily, RN as [facility] Care Management  Advanced Directives 05/02/2020 03/13/2020 03/13/2020 02/28/2020 02/28/2020 01/23/2018 10/20/2017  Does Patient Have a Medical Advance Directive? No No No No Unable to assess, patient is non-responsive or altered mental status No No  Would patient like information on creating a medical advance directive? No - Patient declined No - Patient declined No - Patient declined No - Patient declined - Yes (MAU/Ambulatory/Procedural Areas - Information given) No - Patient declined    Hospital Utilization Over the Past 12 Months: #  of hospitalizations or ER visits: 1 # of surgeries: 0  Review of Systems    Patient reports that her overall health is worse compared to last year.  Patient feels this way because she recently had a bowel blockage and was hospitalized for several weeks before being discharged to a rehab facility.  She has since been able to come home.   History obtained from chart review and the patient  Patient Reported Readings (BP, Pulse, CBG, Weight, etc) none  Pain Assessment Pain : No/denies pain     Current Medications & Allergies (verified) Allergies as of 05/02/2020   No Known Allergies     Medication List       Accurate as of May 02, 2020  1:35 PM. If you have any questions, ask your nurse or doctor.        acetaminophen 500 MG tablet Commonly known as: TYLENOL Take 500 mg by mouth every 6 (six) hours as needed for moderate pain or headache.   albuterol 108 (90 Base) MCG/ACT inhaler Commonly known as: VENTOLIN HFA Inhale 2 puffs into the lungs every 4 (four) hours as needed for wheezing or shortness of breath.   apixaban 5 MG Tabs tablet Commonly known as: ELIQUIS Take 1 tablet (5 mg total) by mouth 2 (two) times Huerta.   brinzolamide 1 % ophthalmic suspension Commonly known as: AZOPT Place 1 drop into both eyes 2 (two) times Huerta.   diltiazem 300 MG 24 hr capsule Commonly known as: Cardizem CD Take 1 capsule (300 mg total) by mouth Huerta.   docusate sodium 100 MG capsule Commonly known as: Colace Take 1 capsule (100 mg total) by mouth 2 (two) times Huerta.   feeding supplement Liqd Take 237 mLs by  mouth 3 (three) times Huerta between meals.   Fluticasone-Salmeterol 250-50 MCG/DOSE Aepb Commonly known as: ADVAIR Inhale 1 puff into the lungs in the morning and at bedtime.   HYDROcodone-acetaminophen 5-325 MG tablet Commonly known as: Norco Take 1 tablet by mouth 2 (two) times Huerta as needed for moderate pain.   HYDROcodone-acetaminophen 5-325 MG  tablet Commonly known as: Norco Take 1 tablet by mouth every 6 (six) hours as needed for moderate pain.   HYDROcodone-acetaminophen 5-325 MG tablet Commonly known as: Norco Take 1 tablet by mouth every 12 (twelve) hours as needed for moderate pain.   latanoprost 0.005 % ophthalmic solution Commonly known as: XALATAN Place 1 drop into both eyes at bedtime.   multivitamin with minerals tablet Take 1 tablet by mouth Huerta.   ondansetron 4 MG tablet Commonly known as: Zofran Take 1 tablet (4 mg total) by mouth every 8 (eight) hours as needed for nausea or vomiting.   pantoprazole 40 MG tablet Commonly known as: Protonix Take 1 tablet (40 mg total) by mouth 2 (two) times Huerta before a meal.   pravastatin 40 MG tablet Commonly known as: PRAVACHOL TAKE ONE (1) TABLET EACH DAY   rosuvastatin 5 MG tablet Commonly known as: CRESTOR Take 5 mg by mouth Huerta.   thiamine 100 MG tablet Take 1 tablet (100 mg total) by mouth Huerta.       History (reviewed): Past Medical History:  Diagnosis Date  . Anxiety   . Arthritis   . COPD (chronic obstructive pulmonary disease) (HCC)   . GERD (gastroesophageal reflux disease)   . Glaucoma 1998   Dr Carlynn Purl in [facility]  . Hyperlipidemia   . Hypertension   . Osteopenia    Past Surgical History:  Procedure Laterality Date  . ABDOMINAL HYSTERECTOMY    . CARPAL TUNNEL RELEASE Bilateral   . COLONOSCOPY WITH PROPOFOL N/A 10/20/2017   Procedure: COLONOSCOPY WITH PROPOFOL;  Surgeon: Corbin Ade, MD;  Location: [facility];  Service: Endoscopy;  Laterality: N/A;  1:45pm  . ELBOW SURGERY Right   . SHOULDER SURGERY Left    Family History  Problem Relation Age of Onset  . Hip fracture Mother   . Glaucoma Sister   . Breast cancer Sister 56  . Diabetes Sister   . Glaucoma Sister   . Glaucoma Sister   . Glaucoma Sister   . Other Brother        killed in MVA  . Glaucoma Father   . Colon cancer Neg Hx    Social History    Socioeconomic History  . Marital status: Divorced    Spouse name: Not on file  . Number of children: 2  . Years of education: 10  . Highest education level: 10th grade  Occupational History  . Not on file  Tobacco Use  . Smoking status: Current Every Day Smoker    Packs/day: 0.50    Years: 53.00    Pack years: 26.50    Types: Cigarettes  . Smokeless tobacco: Former Neurosurgeon    Quit date: 03/30/2020  Vaping Use  . Vaping Use: Never used  Substance and Sexual Activity  . Alcohol use: Not Currently    Alcohol/week: 5.0 standard drinks    Types: 1 Cans of beer, 4 Shots of liquor per week  . Drug use: No  . Sexual activity: Not on file  Other Topics Concern  . Not on file  Social History Narrative   Lives at home alone. Feels safe  there. She has an alarm system. She has two children. Her son lives locally and her daughter lives in [place]. She has four granddaughters. She also has two nieces that live locally that she has dinner with on occasion and they will sometimes accompany her to doctor's appointments. She works at the [employer] in [place] about 30 hours a week. She enjoys working and gets a of physical activity there. 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    Social Determinants of Health   Financial Resource Strain: Not on file  Food Insecurity: Not on file  Transportation Needs: Not on file  Physical Activity: Not on file  Stress: Not on file  Social Connections: Not on file    Activities of Huerta Living In your present state of health, do you have any difficulty performing the following activities: 05/02/2020 03/13/2020  Hearing? N N  Vision? N N  Difficulty concentrating or making decisions? N N  Walking or climbing stairs? N Y  Dressing or bathing? N N  Doing errands, shopping? N Y  Quarry manager and eating ? N -  Using the Toilet? N -  In the past six months, have you accidently leaked urine? N -  Do you have problems with loss of bowel  control? N -  Managing your Medications? N -  Managing your Finances? N -  Housekeeping or managing your Housekeeping? N -  Some recent data might be hidden    Patient Education/ Literacy How often do you need to have someone help you when you read instructions, pamphlets, or other written materials from your doctor or pharmacy?: 1 - Never What is the last grade level you completed in school?: 9th grade  Exercise Current Exercise Habits: The patient does not participate in regular exercise at present, Exercise limited by: orthopedic condition(s)  Diet Patient reports consuming 2 meals a day and 2 snack(s) a day Patient reports that her primary diet is: Regular Patient reports that she does have regular access to food.   Depression Screen PHQ 2/9 Scores 05/02/2020 04/20/2020 12/02/2019 06/18/2019 03/25/2019 09/15/2018 06/18/2018  PHQ - 2 Score 0 0 0 0 0 0 0     Fall Risk Fall Risk  05/02/2020 04/20/2020 12/02/2019 06/18/2019 06/18/2019  Falls in the past year? 0 0 1 1 0  Comment - - - - -  Number falls in past yr: - - 0 0 -  Injury with Fall? - - 1 1 -  Comment - - - - -  Risk for fall due to : - - History of fall(s) History of fall(s) -  Follow up Falls evaluation completed - Education provided Falls prevention discussed -     Objective:  Samantha Huerta seemed alert and oriented and she participated appropriately during our telephone visit.  Blood Pressure Weight BMI  BP Readings from Last 3 Encounters:  04/20/20 114/69  03/17/20 124/69  03/10/20 125/69   Wt Readings from Last 3 Encounters:  04/20/20 93 lb 6.4 oz (42.4 kg)  03/13/20 97 lb 14.2 oz (44.4 kg)  03/07/20 124 lb 1.9 oz (56.3 kg)   BMI Readings from Last 1 Encounters:  04/20/20 15.54 kg/m    *Unable to obtain current vital signs, weight, and BMI due to telephone visit type  Hearing/Vision  . Sherika did not seem to have difficulty with hearing/understanding during the telephone conversation . Reports that she has not  had a formal eye exam by an eye care professional  within the past year . Reports that she has not had a formal hearing evaluation within the past year *Unable to fully assess hearing and vision during telephone visit type  Cognitive Function: 6CIT Screen 05/02/2020 01/23/2018  What Year? 0 points 0 points  What month? 0 points 0 points  What time? 0 points 0 points  Count back from 20 0 points 0 points  Months in reverse 0 points 0 points  Repeat phrase 0 points 0 points  Total Score 0 0   (Normal:0-7, Significant for Dysfunction: >8)  Normal Cognitive Function Screening: Yes   Immunization & Health Maintenance Record Immunization History  Administered Date(s) Administered  . Fluad Quad(high Dose 65+) 01/14/2019, 12/02/2019  . Influenza, High Dose Seasonal PF 12/01/2015, 12/26/2016, 12/12/2017  . Influenza,inj,Quad PF,6+ Mos 01/05/2013, 12/09/2013, 12/09/2014  . PFIZER(Purple Top)SARS-COV-2 Vaccination 11/04/2019, 11/25/2019  . Pneumococcal Conjugate-13 03/23/2015  . Pneumococcal Polysaccharide-23 04/10/2005, 04/10/2009  . Td 06/11/2018  . Zoster Recombinat (Shingrix) 09/19/2016, 12/26/2016    Health Maintenance  Topic Date Due  . COVID-19 Vaccine (3 - Booster for Pfizer series) 05/24/2020  . MAMMOGRAM  02/06/2021  . TETANUS/TDAP  06/10/2028  . INFLUENZA VACCINE  Completed  . DEXA SCAN  Completed  . Hepatitis C Screening  Completed  . PNA vac Low Risk Adult  Completed  . PAP SMEAR-Modifier  Discontinued       Assessment  This is a routine wellness examination for Samantha Huerta.  Health Maintenance: Due or Overdue There are no preventive care reminders to display for this patient.  Samantha Huerta does not need a referral for Community Assistance: Care Management:   no Social Work:    no Prescription Assistance:  no Nutrition/Diabetes Education:  no   Plan:  Personalized Goals Goals Addressed            This Visit's Progress   . Patient Stated        05/02/2020 AWV Goal: Fall Prevention  . Over the next year, patient will decrease their risk for falls by: o Using assistive devices, such as a cane or walker, as needed o Identifying fall risks within their home and correcting them by: - Removing throw rugs - Adding handrails to stairs or ramps - Removing clutter and keeping a clear pathway throughout the home - Increasing light, especially at night - Adding shower handles/bars - Raising toilet seat o Identifying potential personal risk factors for falls: - Medication side effects - Incontinence/urgency - Vestibular dysfunction - Hearing loss - Musculoskeletal disorders - Neurological disorders - Orthostatic hypotension        Personalized Health Maintenance & Screening Recommendations  Bone densitometry screening  Lung Cancer Screening Recommended: yes (Low Dose CT Chest recommended if Age 14-80 years, 30 pack-year currently smoking OR have quit w/in past 15 years) Hepatitis C Screening recommended: no HIV Screening recommended: no  Advanced Directives: Written information was not prepared per patient's request.  Referrals & Orders No orders of the defined types were placed in this encounter.   Follow-up Plan . Follow-up with Samantha SpencerHawks, Christy A, FNP as planned . Schedule dexa scan    I have personally reviewed and noted the following in the patient's chart:   . Medical and social history . Use of alcohol, tobacco or illicit drugs  . Current medications and supplements . Functional ability and status . Nutritional status . Physical activity . Advanced directives . List of other physicians . Hospitalizations, surgeries, and ER visits in previous 12 months .  Vitals . Screenings to include cognitive, depression, and falls . Referrals and appointments  In addition, I have reviewed and discussed with Samantha Huerta certain preventive protocols, quality metrics, and best practice recommendations. A written  personalized care plan for preventive services as well as general preventive health recommendations is available and can be mailed to the patient at her request.      Mariam Dollar, LPN    [id number]/[id number]  Patient declined after visit summary.

## 2020-05-12 ENCOUNTER — Telehealth: Payer: Self-pay | Admitting: *Deleted

## 2020-05-12 ENCOUNTER — Telehealth: Payer: Medicare Other | Admitting: *Deleted

## 2020-05-12 NOTE — Telephone Encounter (Signed)
  Chronic Care Management   Outreach Note  05/12/2020 Name: Samantha Huerta MRN: [id number] DOB: 26-Oct-1945  Referred by: [name], FNP Reason for referral : Chronic Care Management (Unsuccessful Outreach)   Patient was outreached for an Initial Telephone visit but had forgotten about the appointment and was out running errands. She didn't feel comfortable talking while driving and requested that I reschedule the telephone visit. he patient was referred to the case management team for assistance with care management and care coordination.   Patient was rescheduled for 05/25/20 at 11:30 and she is aware.    Demetrios Loll, BSN, RN-BC Embedded Chronic Care Manager [facility] Care Management Direct Dial: [phone]

## 2020-05-25 ENCOUNTER — Telehealth: Payer: Medicare Other | Admitting: *Deleted

## 2020-05-25 ENCOUNTER — Telehealth: Payer: Self-pay | Admitting: *Deleted

## 2020-05-25 NOTE — Telephone Encounter (Signed)
  Chronic Care Management   Outreach Note  05/25/2020 Name: Samantha Huerta MRN: [id number] DOB: [birth year]  Referred by: [name], FNP Reason for referral : Chronic Care Management (Unsuccessful initial visit)   A second unsuccessful telephone outreach was attempted today. The patient was referred to the case management team for assistance with care management and care coordination.   Follow Up Plan: forwarding to [facility] Care Guides for outreach and scheduling  Demetrios Loll, BSN, RN-BC Embedded Chronic Care Manager [facility] Care Management Direct Dial: [phone]

## 2020-06-05 DIAGNOSIS — R7402 Elevation of levels of lactic acid dehydrogenase (LDH): Secondary | ICD-10-CM | POA: Diagnosis not present

## 2020-06-05 DIAGNOSIS — R6889 Other general symptoms and signs: Secondary | ICD-10-CM | POA: Diagnosis not present

## 2020-06-05 DIAGNOSIS — I10 Essential (primary) hypertension: Secondary | ICD-10-CM | POA: Diagnosis not present

## 2020-06-05 DIAGNOSIS — M47819 Spondylosis without myelopathy or radiculopathy, site unspecified: Secondary | ICD-10-CM | POA: Diagnosis not present

## 2020-06-05 DIAGNOSIS — S2232XA Fracture of one rib, left side, initial encounter for closed fracture: Secondary | ICD-10-CM | POA: Diagnosis not present

## 2020-06-05 DIAGNOSIS — K558 Other vascular disorders of intestine: Secondary | ICD-10-CM | POA: Diagnosis not present

## 2020-06-05 DIAGNOSIS — S2231XA Fracture of one rib, right side, initial encounter for closed fracture: Secondary | ICD-10-CM | POA: Diagnosis not present

## 2020-06-05 DIAGNOSIS — Z743 Need for continuous supervision: Secondary | ICD-10-CM | POA: Diagnosis not present

## 2020-06-05 DIAGNOSIS — J9602 Acute respiratory failure with hypercapnia: Secondary | ICD-10-CM | POA: Diagnosis not present

## 2020-06-05 DIAGNOSIS — J449 Chronic obstructive pulmonary disease, unspecified: Secondary | ICD-10-CM | POA: Diagnosis not present

## 2020-06-05 DIAGNOSIS — F17210 Nicotine dependence, cigarettes, uncomplicated: Secondary | ICD-10-CM | POA: Diagnosis not present

## 2020-06-05 DIAGNOSIS — I4891 Unspecified atrial fibrillation: Secondary | ICD-10-CM | POA: Diagnosis not present

## 2020-06-05 DIAGNOSIS — K469 Unspecified abdominal hernia without obstruction or gangrene: Secondary | ICD-10-CM | POA: Diagnosis not present

## 2020-06-05 DIAGNOSIS — A419 Sepsis, unspecified organism: Secondary | ICD-10-CM | POA: Diagnosis not present

## 2020-06-05 DIAGNOSIS — K458 Other specified abdominal hernia without obstruction or gangrene: Secondary | ICD-10-CM | POA: Diagnosis not present

## 2020-06-05 DIAGNOSIS — E161 Other hypoglycemia: Secondary | ICD-10-CM | POA: Diagnosis not present

## 2020-06-05 DIAGNOSIS — Z515 Encounter for palliative care: Secondary | ICD-10-CM | POA: Diagnosis not present

## 2020-06-05 DIAGNOSIS — I959 Hypotension, unspecified: Secondary | ICD-10-CM | POA: Diagnosis not present

## 2020-06-05 DIAGNOSIS — R911 Solitary pulmonary nodule: Secondary | ICD-10-CM | POA: Diagnosis not present

## 2020-06-05 DIAGNOSIS — R6521 Severe sepsis with septic shock: Secondary | ICD-10-CM | POA: Diagnosis not present

## 2020-06-05 DIAGNOSIS — K562 Volvulus: Secondary | ICD-10-CM | POA: Diagnosis not present

## 2020-06-05 DIAGNOSIS — R000 Tachycardia, unspecified: Secondary | ICD-10-CM | POA: Diagnosis not present

## 2020-06-05 DIAGNOSIS — Z7901 Long term (current) use of anticoagulants: Secondary | ICD-10-CM | POA: Diagnosis not present

## 2020-06-05 DIAGNOSIS — K631 Perforation of intestine (nontraumatic): Secondary | ICD-10-CM | POA: Diagnosis not present

## 2020-06-05 DIAGNOSIS — E860 Dehydration: Secondary | ICD-10-CM | POA: Diagnosis not present

## 2020-06-05 DIAGNOSIS — K3189 Other diseases of stomach and duodenum: Secondary | ICD-10-CM | POA: Diagnosis not present

## 2020-06-05 DIAGNOSIS — K5939 Other megacolon: Secondary | ICD-10-CM | POA: Diagnosis not present

## 2020-06-05 DIAGNOSIS — E162 Hypoglycemia, unspecified: Secondary | ICD-10-CM | POA: Diagnosis not present

## 2020-06-05 DIAGNOSIS — Z4659 Encounter for fitting and adjustment of other gastrointestinal appliance and device: Secondary | ICD-10-CM | POA: Diagnosis not present

## 2020-06-05 DIAGNOSIS — I700 Atherosclerosis of aorta: Secondary | ICD-10-CM | POA: Diagnosis not present

## 2020-06-05 DIAGNOSIS — Z66 Do not resuscitate: Secondary | ICD-10-CM | POA: Diagnosis not present

## 2020-06-05 DIAGNOSIS — Z79899 Other long term (current) drug therapy: Secondary | ICD-10-CM | POA: Diagnosis not present

## 2020-06-05 DIAGNOSIS — R100 Acute abdomen: Secondary | ICD-10-CM | POA: Diagnosis not present

## 2020-06-05 DIAGNOSIS — I440 Atrioventricular block, first degree: Secondary | ICD-10-CM | POA: Diagnosis not present

## 2020-06-05 DIAGNOSIS — I491 Atrial premature depolarization: Secondary | ICD-10-CM | POA: Diagnosis not present

## 2020-06-05 DIAGNOSIS — K55059 Acute (reversible) ischemia of intestine, part and extent unspecified: Secondary | ICD-10-CM | POA: Diagnosis not present

## 2020-06-05 DIAGNOSIS — K55029 Acute infarction of small intestine, extent unspecified: Secondary | ICD-10-CM | POA: Diagnosis not present

## 2020-06-05 DIAGNOSIS — K56699 Other intestinal obstruction unspecified as to partial versus complete obstruction: Secondary | ICD-10-CM | POA: Diagnosis not present

## 2020-06-05 DIAGNOSIS — Z20822 Contact with and (suspected) exposure to covid-19: Secondary | ICD-10-CM | POA: Diagnosis not present

## 2020-06-05 DIAGNOSIS — K660 Peritoneal adhesions (postprocedural) (postinfection): Secondary | ICD-10-CM | POA: Diagnosis not present

## 2020-06-05 DIAGNOSIS — K56609 Unspecified intestinal obstruction, unspecified as to partial versus complete obstruction: Secondary | ICD-10-CM | POA: Diagnosis not present

## 2020-06-05 DIAGNOSIS — R570 Cardiogenic shock: Secondary | ICD-10-CM | POA: Diagnosis not present

## 2020-06-05 DIAGNOSIS — R918 Other nonspecific abnormal finding of lung field: Secondary | ICD-10-CM | POA: Diagnosis not present

## 2020-06-05 DIAGNOSIS — K6389 Other specified diseases of intestine: Secondary | ICD-10-CM | POA: Diagnosis not present

## 2020-06-05 DIAGNOSIS — E872 Acidosis: Secondary | ICD-10-CM | POA: Diagnosis not present

## 2020-06-05 DIAGNOSIS — N179 Acute kidney failure, unspecified: Secondary | ICD-10-CM | POA: Diagnosis not present

## 2020-06-05 DIAGNOSIS — J9601 Acute respiratory failure with hypoxia: Secondary | ICD-10-CM | POA: Diagnosis not present

## 2020-06-05 DIAGNOSIS — K7200 Acute and subacute hepatic failure without coma: Secondary | ICD-10-CM | POA: Diagnosis not present

## 2020-06-05 DIAGNOSIS — E785 Hyperlipidemia, unspecified: Secondary | ICD-10-CM | POA: Diagnosis not present

## 2020-06-06 ENCOUNTER — Telehealth: Payer: Self-pay | Admitting: *Deleted

## 2020-06-06 DIAGNOSIS — R918 Other nonspecific abnormal finding of lung field: Secondary | ICD-10-CM | POA: Diagnosis not present

## 2020-06-06 DIAGNOSIS — Z4659 Encounter for fitting and adjustment of other gastrointestinal appliance and device: Secondary | ICD-10-CM | POA: Diagnosis not present

## 2020-06-09 NOTE — Telephone Encounter (Signed)
Please reschedule Initial visit with RN CM [facility]

## 2020-06-09 NOTE — Chronic Care Management (AMB) (Signed)
  Care Management   Note   Name: Samantha Huerta MRN: [id number] DOB: [birth year]  Samantha Huerta is a 75 y.o. year old female who is a primary care patient of [name], FNP and is actively engaged with the care management team. I reached out to Biagio Borg by phone today to assist with re-scheduling an initial visit with the RN Case Manager  Follow up plan: Unsuccessful telephone outreach attempt made. The care management team will reach out to the patient again over the next 7 days.  If patient returns call to provider office, please advise to call Embedded Care Management Care Guide Avie Arenas at [phone]  Tyjanae Bartek [facility] Guide, Embedded Care Coordination [facility] Health  Care Management

## 2020-06-09 DEATH — deceased

## 2020-06-12 NOTE — Chronic Care Management (AMB) (Signed)
  Chronic Care Management   Outreach Note  06/12/2020 Name: ZUMA HUST MRN: [id number] DOB: December 04, 1945  MEELA WAREING is a 75 y.o. year old female who is a primary care patient of [name], FNP. I reached out to Biagio Borg by phone today in response to a referral sent by Ms. Liane Comber Gauthreaux's PCP, [name], FNP     A second unsuccessful telephone outreach was attempted today. The patient was referred to the case management team for assistance with care management and care coordination.   Follow Up Plan: A HIPAA compliant phone message was left for the patient providing contact information and requesting a return call.  The care management team will reach out to the patient again over the next 7 days.  If patient returns call to provider office, please advise to call Embedded Care Management Care Guide Avie Arenas at [phone]  Caitlynne Harbeck [facility] Guide, Embedded Care Coordination [facility] Health  Care Management

## 2020-06-29 NOTE — Chronic Care Management (AMB) (Signed)
  Care Management   Note  06/29/2020 Name: AMINAH ZABAWA MRN: [id number] DOB: [birth year]  ALTON TREMBLAY is a 75 y.o. year old female who is a primary care patient of [name], FNP and is actively engaged with the care management team. I reached out to Biagio Borg by phone today to assist with re-scheduling an initial visit with the RN Case Manager  Follow up plan: Patient deceased  Jerrelle Michelsen [facility] Guide, Embedded Care Coordination [facility]  Care Management

## 2020-07-18 ENCOUNTER — Ambulatory Visit: Payer: Medicare Other | Admitting: Family

## 2021-10-14 IMAGING — DX DG ABDOMEN ACUTE W/ 1V CHEST
3 series · 3 of 3 positions shown · non-contrast
Comparison: Earlier abdominal radiograph 8688 hours, chest
radiograph 02/29/2020

CLINICAL DATA: Small-bowel obstruction, nausea, weakness

EXAM:
DG ABDOMEN ACUTE WITH 1 VIEW CHEST

[chest ap]
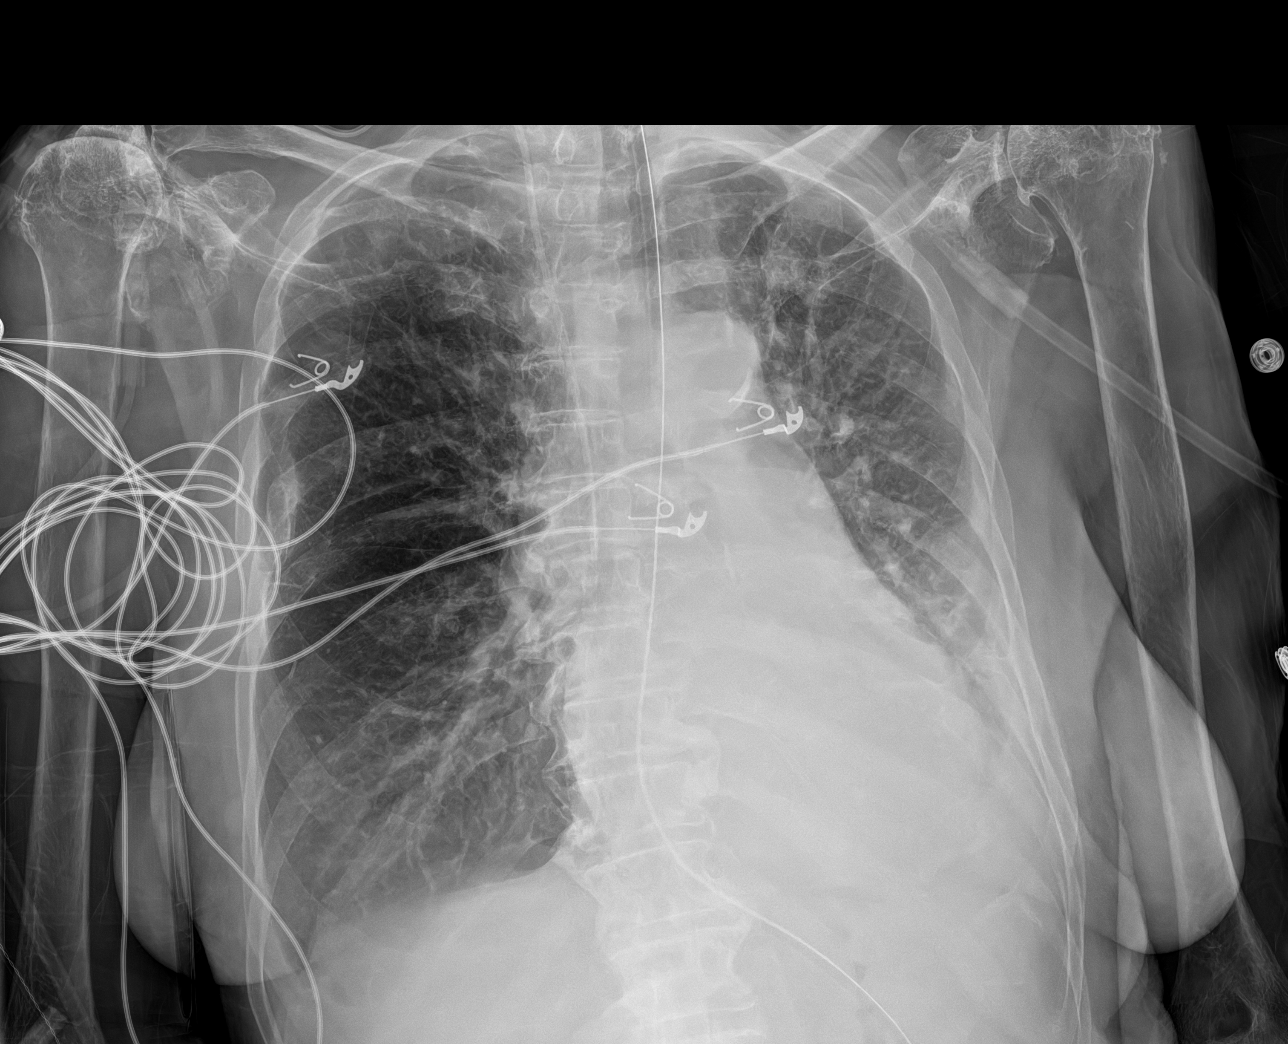

[abdomen erect ap]
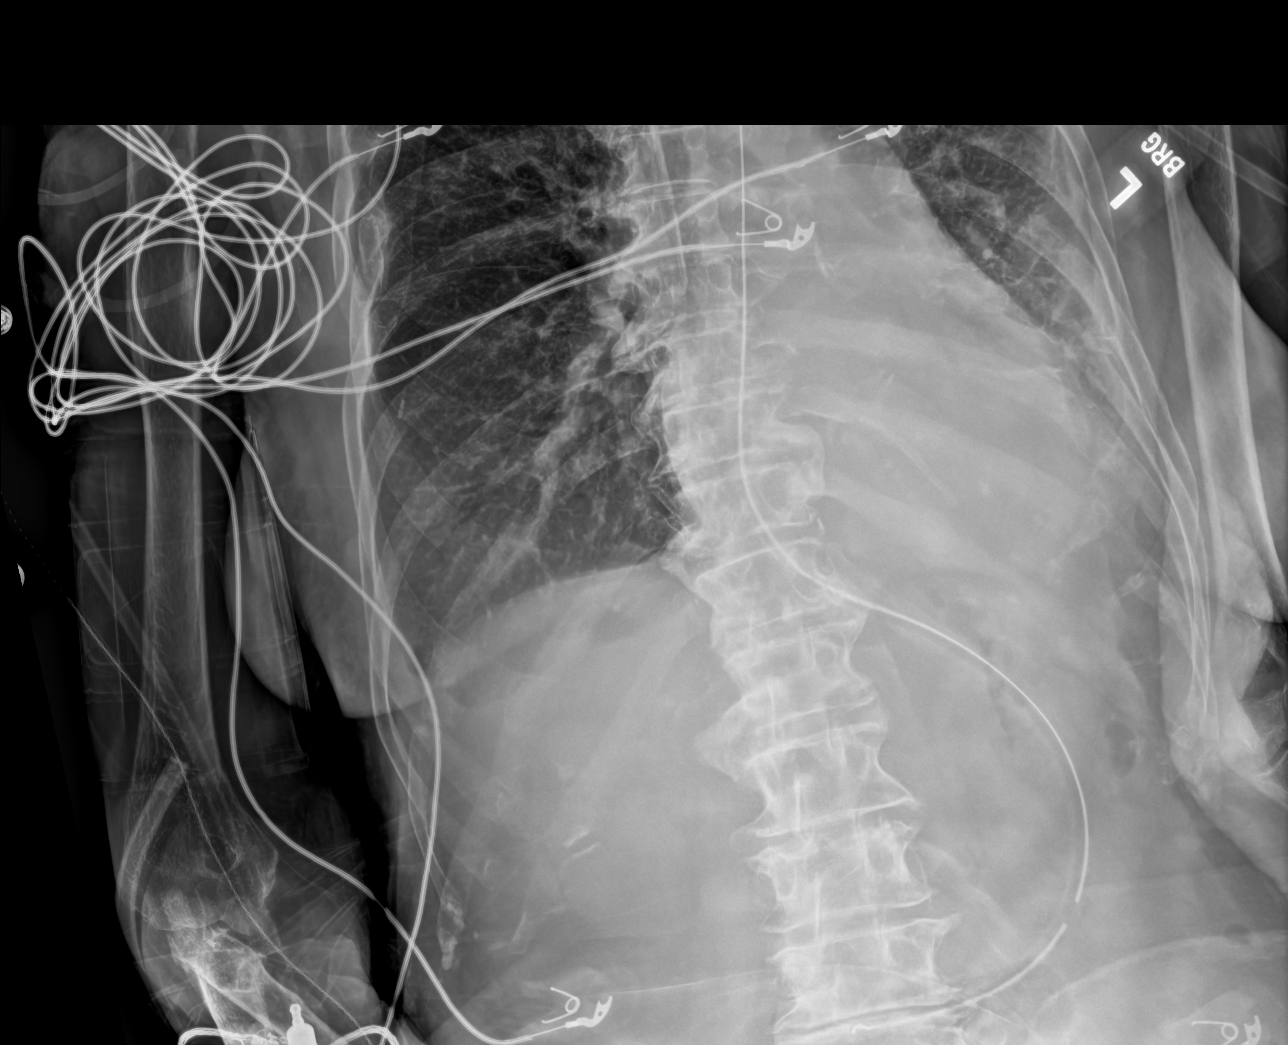

[abdomen supine]
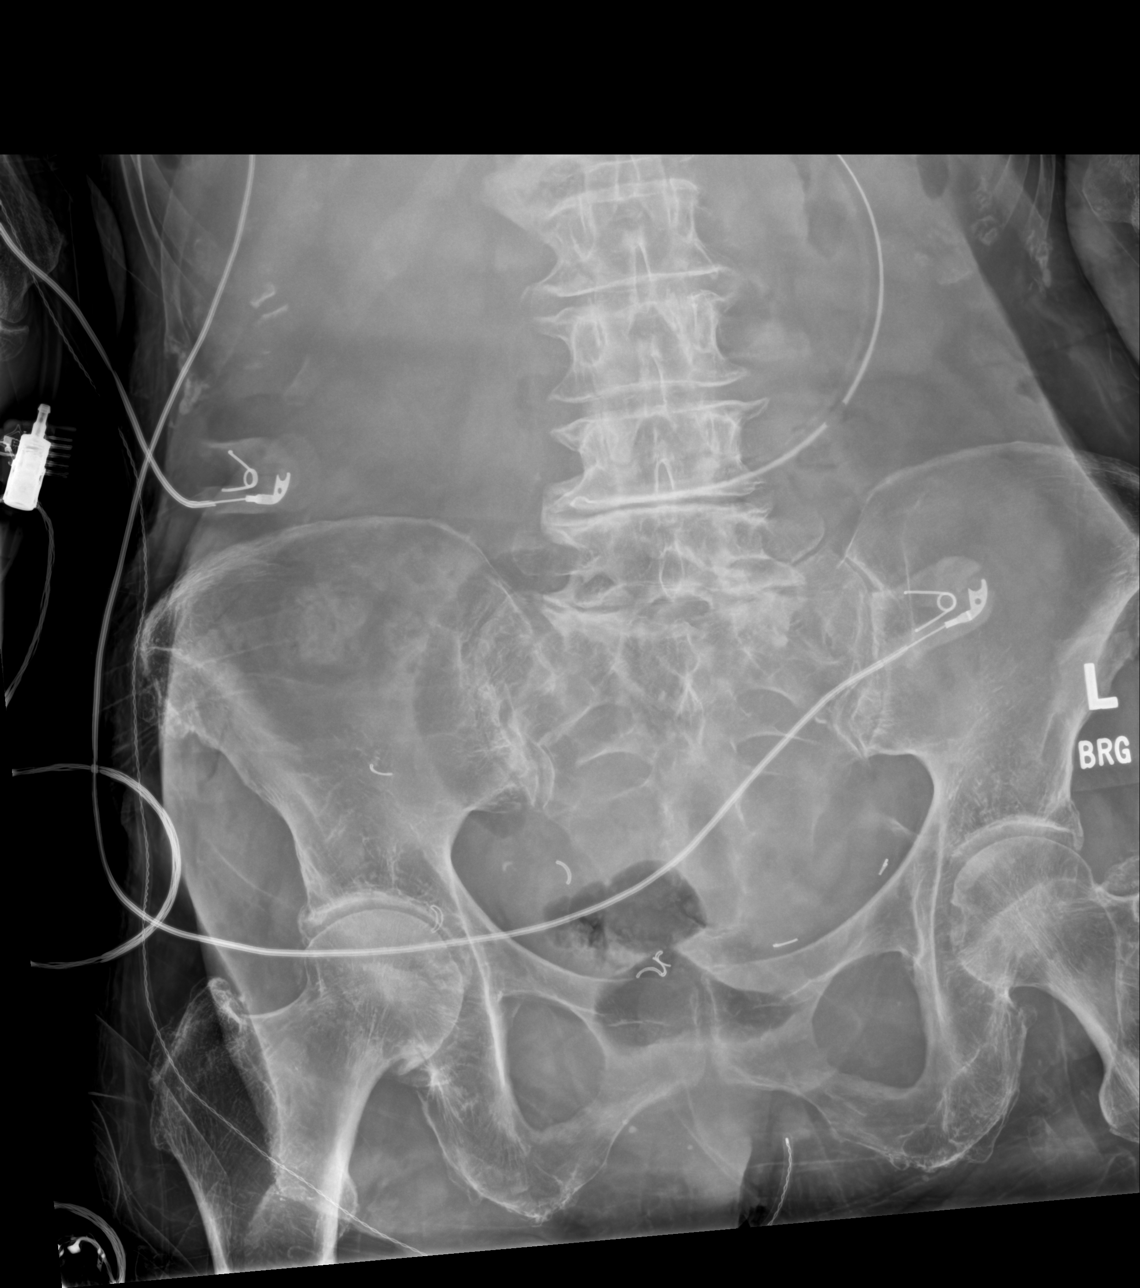

[3 of 3 positions shown; findings below may reference images not displayed]

FINDINGS: Nasogastric tube extends into stomach.

RIGHT jugular line tip projecting over SVC.

Normal heart size, mediastinal contours, and pulmonary vascularity.

Atherosclerotic calcification aorta.

Atelectasis versus consolidation LEFT lower lobe new since prior
study.

Small LEFT pleural effusion.

Underlying emphysematous changes.

No pneumothorax.

Nonobstructive bowel gas pattern.

No bowel dilatation or wall thickening.

No free air.

Bones demineralized with BILATERAL chronic rotator cuff tears and
degenerative disc disease changes of the thoracic/lumbar spine.
IMPRESSION: Nonobstructive bowel gas pattern.

Atelectasis versus consolidation LEFT lower lobe new since
02/28/2020 with small associated LEFT pleural effusion.

## 2021-10-23 IMAGING — DX DG ABD PORTABLE 1V
1 series · 1 of 1 positions shown · non-contrast
Comparison: 03/14/2020

CLINICAL DATA: Small-bowel obstruction

EXAM:
PORTABLE ABDOMEN - 1 VIEW

[abdomen supine]
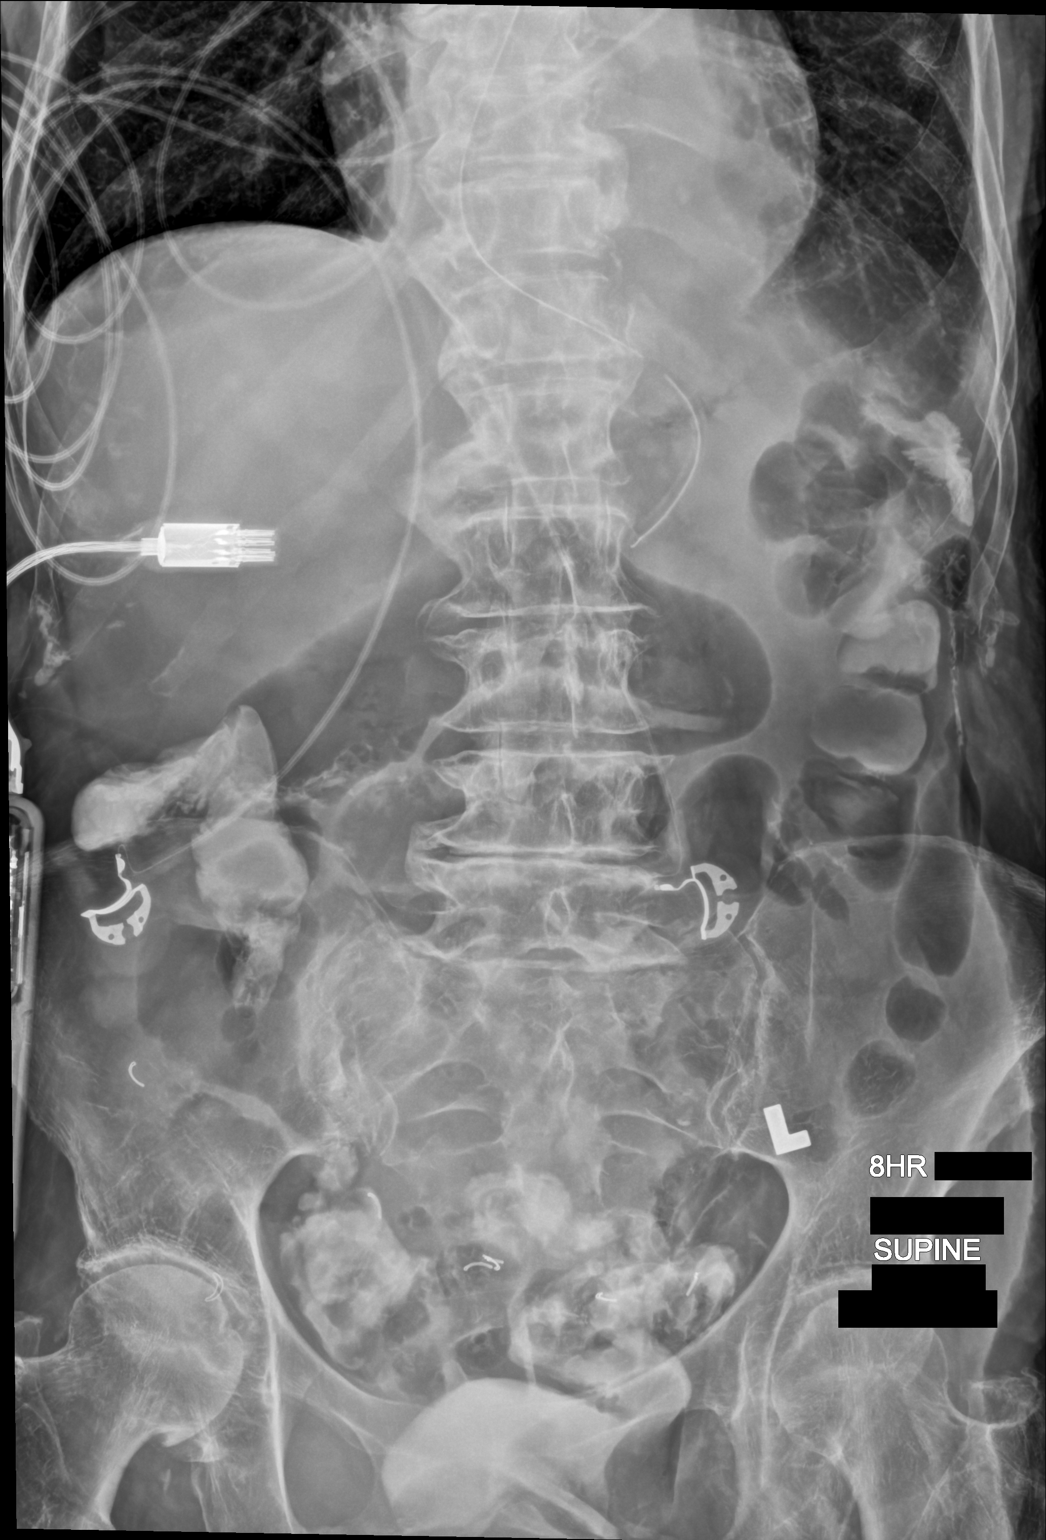

[1 of 1 positions shown; findings below may reference images not displayed]

FINDINGS: Supine frontal view of the abdomen and pelvis was obtained 8 hours
after oral contrast administration. Oral contrast is seen throughout
the colon to the level of the rectum. No evidence of bowel
obstruction. No masses or abnormal calcifications. Enteric catheter
tip and side port project over the gastric fundus.
IMPRESSION: 1. Progression of oral contrast into the colon by 8 hours. No
evidence of small-bowel obstruction.

## 2021-10-24 IMAGING — DX DG FOOT COMPLETE 3+V*R*
3 series · 3 of 3 positions shown · non-contrast
Comparison: None.

CLINICAL DATA: Foot pain with right heel wound

EXAM:
RIGHT FOOT COMPLETE - 3+ VIEW

[foot ap]
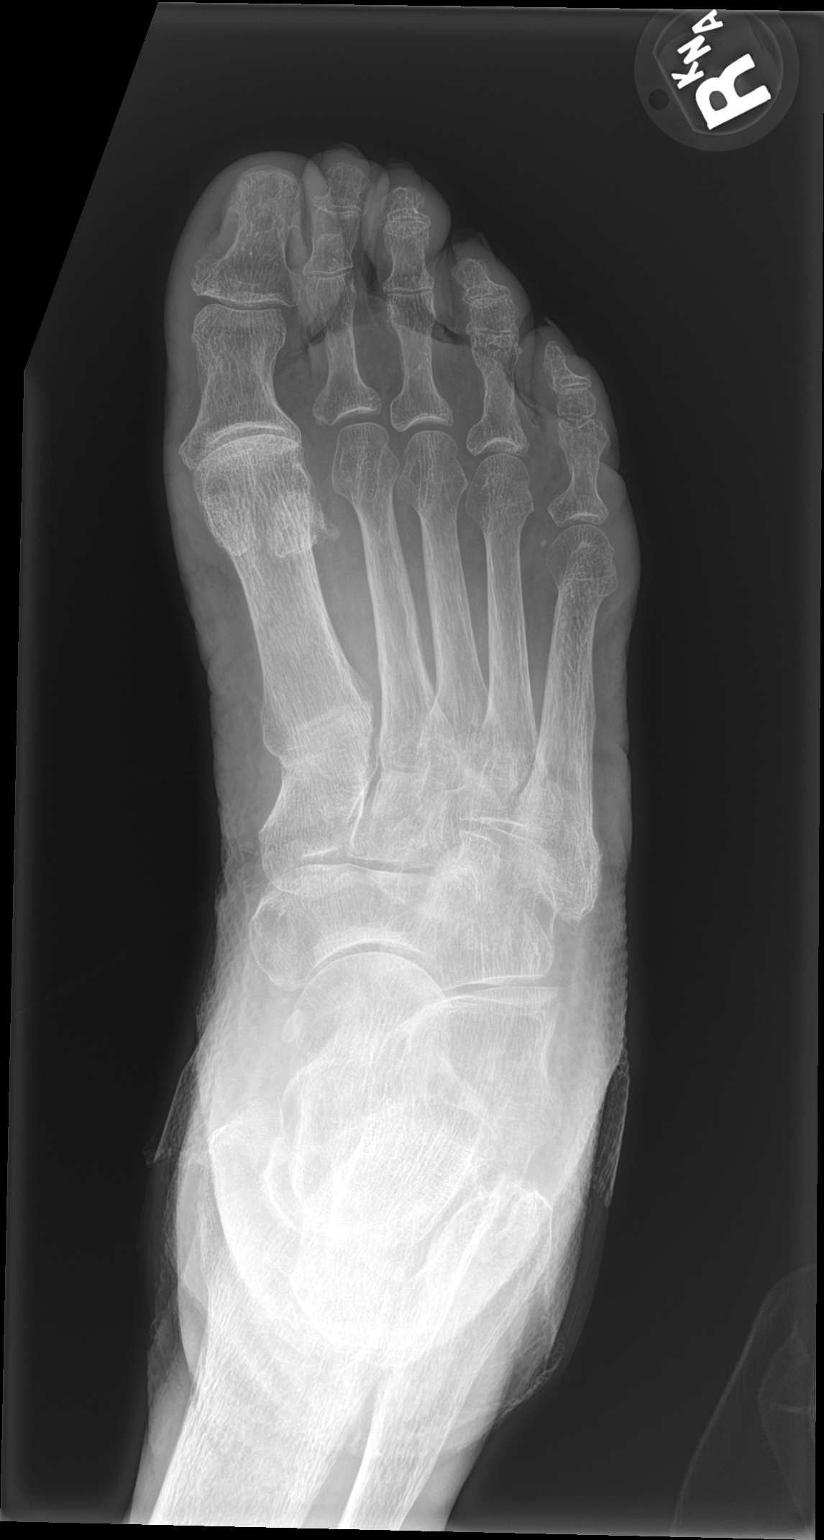

[foot obl]
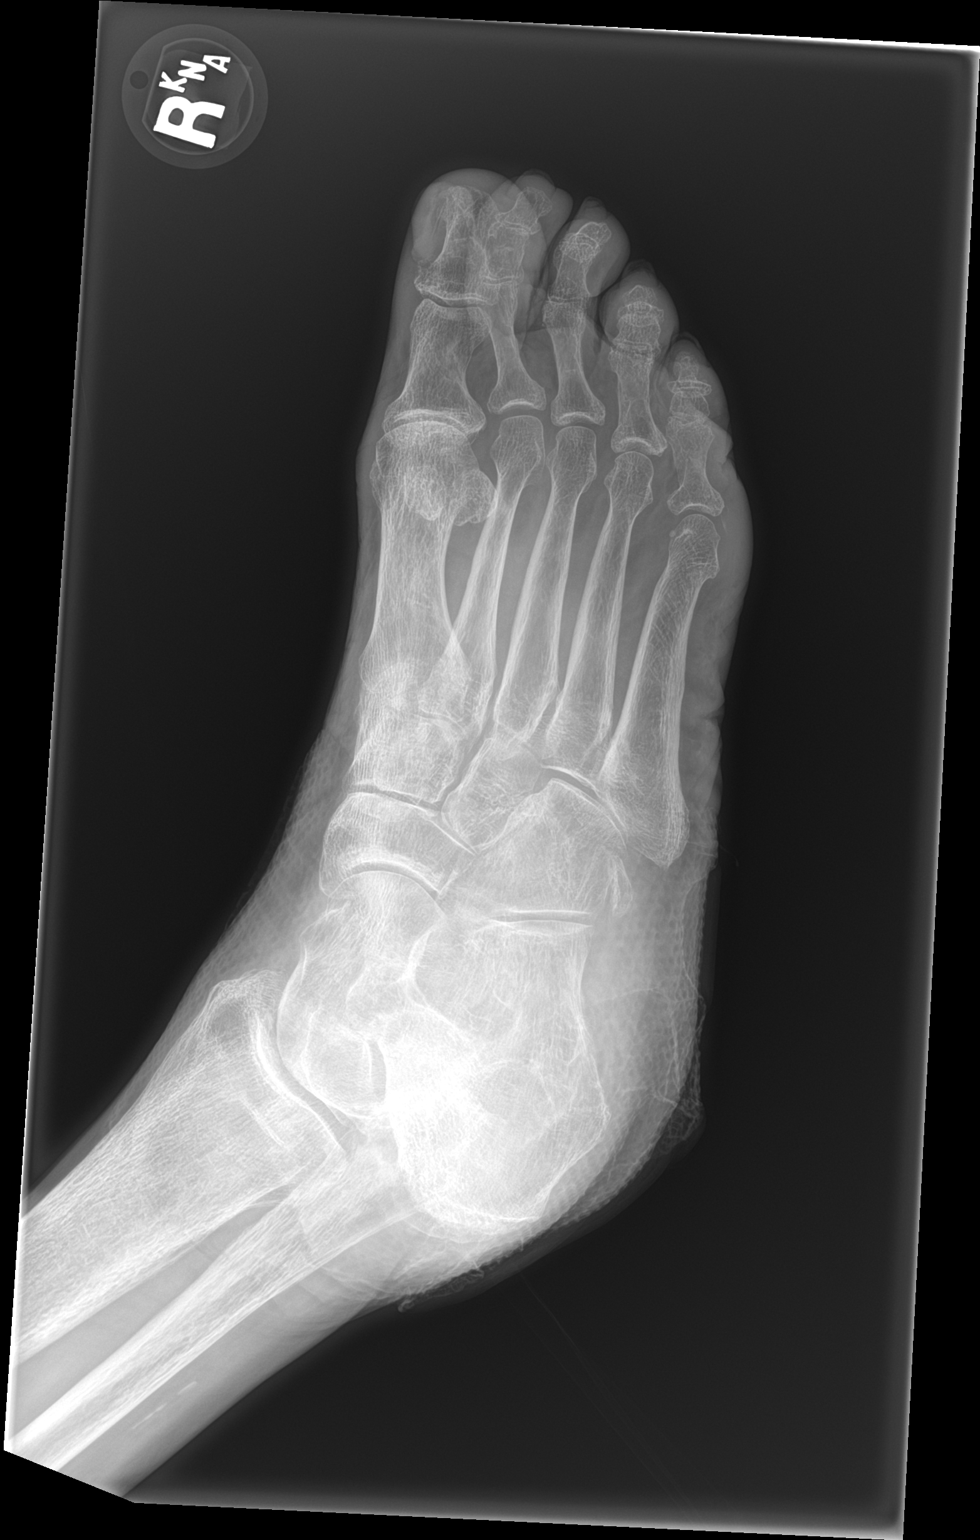

[foot lat]
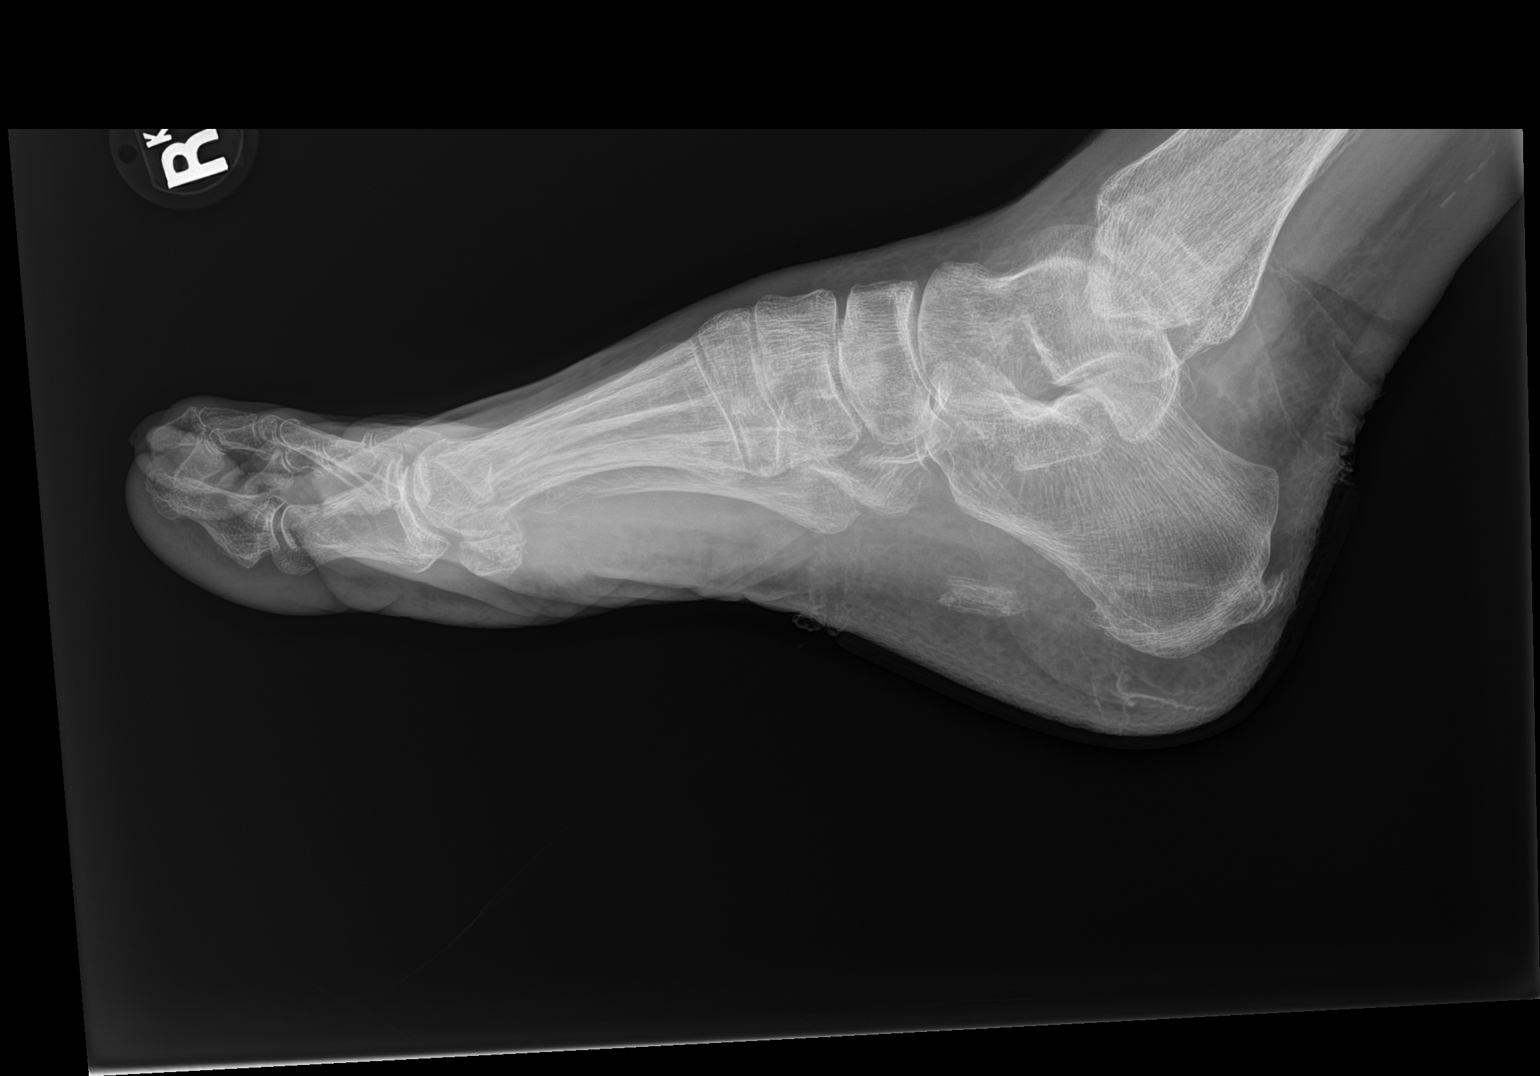

[3 of 3 positions shown; findings below may reference images not displayed]

FINDINGS: Bones appear demineralized. No fracture or malalignment. No
periostitis or bony destructive change. Plantar soft tissue
calcification. Mild degenerative change at the first MTP joint
IMPRESSION: No acute osseous abnormality.
